# Patient Record
Sex: Male | Born: 1960 | Race: White | Hispanic: No | Smoking: Current every day smoker
Health system: Southern US, Community
[De-identification: ages and names within clinical notes are randomized; demographics above are authoritative.]

## PROBLEM LIST (undated history)

## (undated) ENCOUNTER — Emergency Department (HOSPITAL_COMMUNITY): Payer: Self-pay | Source: Home / Self Care

## (undated) ENCOUNTER — Emergency Department (HOSPITAL_COMMUNITY): Admission: EM | Payer: Medicare Other | Source: Home / Self Care

## (undated) DIAGNOSIS — M1990 Unspecified osteoarthritis, unspecified site: Secondary | ICD-10-CM

## (undated) DIAGNOSIS — J449 Chronic obstructive pulmonary disease, unspecified: Secondary | ICD-10-CM

## (undated) DIAGNOSIS — M549 Dorsalgia, unspecified: Secondary | ICD-10-CM

## (undated) DIAGNOSIS — J42 Unspecified chronic bronchitis: Secondary | ICD-10-CM

## (undated) DIAGNOSIS — T4145XA Adverse effect of unspecified anesthetic, initial encounter: Secondary | ICD-10-CM

## (undated) DIAGNOSIS — F329 Major depressive disorder, single episode, unspecified: Secondary | ICD-10-CM

## (undated) DIAGNOSIS — T8859XA Other complications of anesthesia, initial encounter: Secondary | ICD-10-CM

## (undated) DIAGNOSIS — F419 Anxiety disorder, unspecified: Secondary | ICD-10-CM

## (undated) DIAGNOSIS — I10 Essential (primary) hypertension: Secondary | ICD-10-CM

## (undated) DIAGNOSIS — G8929 Other chronic pain: Secondary | ICD-10-CM

## (undated) DIAGNOSIS — J45909 Unspecified asthma, uncomplicated: Secondary | ICD-10-CM

## (undated) DIAGNOSIS — K219 Gastro-esophageal reflux disease without esophagitis: Secondary | ICD-10-CM

## (undated) DIAGNOSIS — Z8719 Personal history of other diseases of the digestive system: Secondary | ICD-10-CM

## (undated) DIAGNOSIS — B20 Human immunodeficiency virus [HIV] disease: Secondary | ICD-10-CM

## (undated) DIAGNOSIS — I2699 Other pulmonary embolism without acute cor pulmonale: Secondary | ICD-10-CM

## (undated) DIAGNOSIS — IMO0002 Reserved for concepts with insufficient information to code with codable children: Secondary | ICD-10-CM

## (undated) DIAGNOSIS — F32A Depression, unspecified: Secondary | ICD-10-CM

## (undated) DIAGNOSIS — Z21 Asymptomatic human immunodeficiency virus [HIV] infection status: Secondary | ICD-10-CM

## (undated) DIAGNOSIS — J189 Pneumonia, unspecified organism: Secondary | ICD-10-CM

## (undated) DIAGNOSIS — M419 Scoliosis, unspecified: Secondary | ICD-10-CM

## (undated) DIAGNOSIS — Z8711 Personal history of peptic ulcer disease: Secondary | ICD-10-CM

## (undated) HISTORY — DX: Human immunodeficiency virus (HIV) disease: B20

## (undated) HISTORY — PX: KNEE ARTHROSCOPY: SHX127

## (undated) HISTORY — DX: Chronic obstructive pulmonary disease, unspecified: J44.9

## (undated) HISTORY — DX: Asymptomatic human immunodeficiency virus (hiv) infection status: Z21

---

## 1997-05-31 ENCOUNTER — Encounter (INDEPENDENT_AMBULATORY_CARE_PROVIDER_SITE_OTHER): Payer: Self-pay | Admitting: *Deleted

## 1997-05-31 LAB — CONVERTED CEMR LAB
CD4 Count: 308 microliters
CD4 T Cell Abs: 308

## 1997-10-28 DIAGNOSIS — M2351 Chronic instability of knee, right knee: Secondary | ICD-10-CM | POA: Insufficient documentation

## 1999-01-11 ENCOUNTER — Encounter: Admission: RE | Admit: 1999-01-11 | Discharge: 1999-01-11 | Payer: Self-pay | Admitting: Infectious Diseases

## 1999-01-11 ENCOUNTER — Ambulatory Visit (HOSPITAL_COMMUNITY): Admission: RE | Admit: 1999-01-11 | Discharge: 1999-01-11 | Payer: Self-pay | Admitting: Infectious Diseases

## 1999-03-07 ENCOUNTER — Encounter: Admission: RE | Admit: 1999-03-07 | Discharge: 1999-03-07 | Payer: Self-pay | Admitting: Internal Medicine

## 1999-05-04 ENCOUNTER — Encounter: Admission: RE | Admit: 1999-05-04 | Discharge: 1999-05-04 | Payer: Self-pay | Admitting: Internal Medicine

## 1999-05-04 ENCOUNTER — Ambulatory Visit (HOSPITAL_COMMUNITY): Admission: RE | Admit: 1999-05-04 | Discharge: 1999-05-04 | Payer: Self-pay | Admitting: Internal Medicine

## 1999-09-07 ENCOUNTER — Encounter: Admission: RE | Admit: 1999-09-07 | Discharge: 1999-09-07 | Payer: Self-pay | Admitting: Internal Medicine

## 1999-11-06 ENCOUNTER — Encounter: Payer: Self-pay | Admitting: Hematology and Oncology

## 1999-11-06 ENCOUNTER — Encounter: Admission: RE | Admit: 1999-11-06 | Discharge: 1999-11-06 | Payer: Self-pay | Admitting: Hematology and Oncology

## 1999-11-06 ENCOUNTER — Ambulatory Visit (HOSPITAL_COMMUNITY): Admission: RE | Admit: 1999-11-06 | Discharge: 1999-11-06 | Payer: Self-pay | Admitting: Hematology and Oncology

## 1999-12-27 ENCOUNTER — Encounter: Admission: RE | Admit: 1999-12-27 | Discharge: 1999-12-27 | Payer: Self-pay | Admitting: Hematology and Oncology

## 2000-08-26 ENCOUNTER — Ambulatory Visit (HOSPITAL_COMMUNITY): Admission: RE | Admit: 2000-08-26 | Discharge: 2000-08-26 | Payer: Self-pay | Admitting: Internal Medicine

## 2000-08-26 ENCOUNTER — Encounter: Admission: RE | Admit: 2000-08-26 | Discharge: 2000-08-26 | Payer: Self-pay | Admitting: Internal Medicine

## 2000-08-26 ENCOUNTER — Encounter: Payer: Self-pay | Admitting: Internal Medicine

## 2000-09-12 ENCOUNTER — Encounter: Admission: RE | Admit: 2000-09-12 | Discharge: 2000-09-12 | Payer: Self-pay | Admitting: Internal Medicine

## 2001-03-04 ENCOUNTER — Emergency Department (HOSPITAL_COMMUNITY): Admission: EM | Admit: 2001-03-04 | Discharge: 2001-03-04 | Payer: Self-pay | Admitting: *Deleted

## 2001-03-10 ENCOUNTER — Ambulatory Visit (HOSPITAL_COMMUNITY): Admission: RE | Admit: 2001-03-10 | Discharge: 2001-03-10 | Payer: Self-pay | Admitting: Internal Medicine

## 2001-03-10 ENCOUNTER — Encounter: Admission: RE | Admit: 2001-03-10 | Discharge: 2001-03-10 | Payer: Self-pay | Admitting: Internal Medicine

## 2001-05-06 ENCOUNTER — Encounter: Admission: RE | Admit: 2001-05-06 | Discharge: 2001-05-06 | Payer: Self-pay | Admitting: Internal Medicine

## 2001-12-22 ENCOUNTER — Ambulatory Visit (HOSPITAL_COMMUNITY): Admission: RE | Admit: 2001-12-22 | Discharge: 2001-12-22 | Payer: Self-pay | Admitting: Internal Medicine

## 2001-12-22 ENCOUNTER — Encounter: Admission: RE | Admit: 2001-12-22 | Discharge: 2001-12-22 | Payer: Self-pay | Admitting: Internal Medicine

## 2001-12-22 ENCOUNTER — Encounter: Payer: Self-pay | Admitting: Internal Medicine

## 2003-01-03 ENCOUNTER — Encounter: Admission: RE | Admit: 2003-01-03 | Discharge: 2003-01-03 | Payer: Self-pay | Admitting: Internal Medicine

## 2003-08-07 ENCOUNTER — Emergency Department (HOSPITAL_COMMUNITY): Admission: AD | Admit: 2003-08-07 | Discharge: 2003-08-08 | Payer: Self-pay | Admitting: Emergency Medicine

## 2003-08-08 ENCOUNTER — Encounter: Payer: Self-pay | Admitting: Emergency Medicine

## 2003-10-21 ENCOUNTER — Emergency Department (HOSPITAL_COMMUNITY): Admission: EM | Admit: 2003-10-21 | Discharge: 2003-10-22 | Payer: Self-pay | Admitting: Emergency Medicine

## 2003-10-25 ENCOUNTER — Ambulatory Visit (HOSPITAL_COMMUNITY): Admission: RE | Admit: 2003-10-25 | Discharge: 2003-10-25 | Payer: Self-pay | Admitting: Family Medicine

## 2003-10-31 ENCOUNTER — Ambulatory Visit (HOSPITAL_COMMUNITY): Admission: RE | Admit: 2003-10-31 | Discharge: 2003-10-31 | Payer: Self-pay | Admitting: Internal Medicine

## 2003-10-31 ENCOUNTER — Encounter: Admission: RE | Admit: 2003-10-31 | Discharge: 2003-10-31 | Payer: Self-pay | Admitting: Internal Medicine

## 2003-10-31 ENCOUNTER — Encounter: Payer: Self-pay | Admitting: Internal Medicine

## 2004-08-09 ENCOUNTER — Ambulatory Visit (HOSPITAL_COMMUNITY): Admission: RE | Admit: 2004-08-09 | Discharge: 2004-08-09 | Payer: Self-pay | Admitting: Internal Medicine

## 2004-08-09 ENCOUNTER — Ambulatory Visit: Payer: Self-pay | Admitting: Internal Medicine

## 2004-08-09 ENCOUNTER — Encounter (INDEPENDENT_AMBULATORY_CARE_PROVIDER_SITE_OTHER): Payer: Self-pay | Admitting: *Deleted

## 2004-08-09 LAB — CONVERTED CEMR LAB: CD4 Count: 400 microliters

## 2004-11-29 ENCOUNTER — Ambulatory Visit (HOSPITAL_COMMUNITY): Admission: RE | Admit: 2004-11-29 | Discharge: 2004-11-29 | Payer: Self-pay | Admitting: Family Medicine

## 2005-01-04 ENCOUNTER — Emergency Department (HOSPITAL_COMMUNITY): Admission: EM | Admit: 2005-01-04 | Discharge: 2005-01-04 | Payer: Self-pay | Admitting: Emergency Medicine

## 2005-06-26 ENCOUNTER — Ambulatory Visit (HOSPITAL_COMMUNITY): Admission: RE | Admit: 2005-06-26 | Discharge: 2005-06-26 | Payer: Self-pay | Admitting: Internal Medicine

## 2005-06-26 ENCOUNTER — Ambulatory Visit: Payer: Self-pay | Admitting: Internal Medicine

## 2005-06-26 ENCOUNTER — Encounter (INDEPENDENT_AMBULATORY_CARE_PROVIDER_SITE_OTHER): Payer: Self-pay | Admitting: *Deleted

## 2005-06-26 LAB — CONVERTED CEMR LAB: HIV 1 RNA Quant: 399 copies/mL

## 2005-08-08 ENCOUNTER — Ambulatory Visit (HOSPITAL_COMMUNITY): Admission: RE | Admit: 2005-08-08 | Discharge: 2005-08-08 | Payer: Self-pay | Admitting: Family Medicine

## 2006-01-02 ENCOUNTER — Ambulatory Visit (HOSPITAL_COMMUNITY): Admission: RE | Admit: 2006-01-02 | Discharge: 2006-01-02 | Payer: Self-pay | Admitting: Neurological Surgery

## 2006-02-22 ENCOUNTER — Emergency Department (HOSPITAL_COMMUNITY): Admission: EM | Admit: 2006-02-22 | Discharge: 2006-02-22 | Payer: Self-pay | Admitting: Emergency Medicine

## 2006-03-26 ENCOUNTER — Encounter: Admission: RE | Admit: 2006-03-26 | Discharge: 2006-03-26 | Payer: Self-pay | Admitting: Internal Medicine

## 2006-03-26 ENCOUNTER — Encounter (INDEPENDENT_AMBULATORY_CARE_PROVIDER_SITE_OTHER): Payer: Self-pay | Admitting: *Deleted

## 2006-03-26 ENCOUNTER — Ambulatory Visit (HOSPITAL_COMMUNITY): Admission: RE | Admit: 2006-03-26 | Discharge: 2006-03-26 | Payer: Self-pay | Admitting: Internal Medicine

## 2006-03-26 ENCOUNTER — Ambulatory Visit: Payer: Self-pay | Admitting: Internal Medicine

## 2006-03-26 LAB — CONVERTED CEMR LAB
CD4 Count: 480 microliters
HIV 1 RNA Quant: 399 copies/mL

## 2006-09-12 ENCOUNTER — Ambulatory Visit: Payer: Self-pay | Admitting: Internal Medicine

## 2006-09-12 ENCOUNTER — Encounter (INDEPENDENT_AMBULATORY_CARE_PROVIDER_SITE_OTHER): Payer: Self-pay | Admitting: *Deleted

## 2006-09-12 ENCOUNTER — Encounter: Admission: RE | Admit: 2006-09-12 | Discharge: 2006-09-12 | Payer: Self-pay | Admitting: Internal Medicine

## 2006-09-12 LAB — CONVERTED CEMR LAB
CD4 Count: 360 microliters
HIV 1 RNA Quant: 118 copies/mL

## 2006-09-30 ENCOUNTER — Ambulatory Visit: Payer: Self-pay | Admitting: Internal Medicine

## 2006-11-13 DIAGNOSIS — J449 Chronic obstructive pulmonary disease, unspecified: Secondary | ICD-10-CM | POA: Insufficient documentation

## 2006-11-13 DIAGNOSIS — B20 Human immunodeficiency virus [HIV] disease: Secondary | ICD-10-CM | POA: Insufficient documentation

## 2006-11-13 DIAGNOSIS — K219 Gastro-esophageal reflux disease without esophagitis: Secondary | ICD-10-CM | POA: Insufficient documentation

## 2006-11-13 DIAGNOSIS — F1910 Other psychoactive substance abuse, uncomplicated: Secondary | ICD-10-CM | POA: Insufficient documentation

## 2006-11-28 ENCOUNTER — Telehealth: Payer: Self-pay | Admitting: *Deleted

## 2006-12-03 ENCOUNTER — Encounter: Payer: Self-pay | Admitting: Internal Medicine

## 2006-12-03 DIAGNOSIS — M545 Low back pain, unspecified: Secondary | ICD-10-CM | POA: Insufficient documentation

## 2006-12-03 DIAGNOSIS — F329 Major depressive disorder, single episode, unspecified: Secondary | ICD-10-CM | POA: Insufficient documentation

## 2006-12-22 ENCOUNTER — Encounter (INDEPENDENT_AMBULATORY_CARE_PROVIDER_SITE_OTHER): Payer: Self-pay | Admitting: *Deleted

## 2006-12-22 LAB — CONVERTED CEMR LAB

## 2007-01-04 ENCOUNTER — Encounter (INDEPENDENT_AMBULATORY_CARE_PROVIDER_SITE_OTHER): Payer: Self-pay | Admitting: *Deleted

## 2007-02-05 ENCOUNTER — Emergency Department (HOSPITAL_COMMUNITY): Admission: EM | Admit: 2007-02-05 | Discharge: 2007-02-05 | Payer: Self-pay | Admitting: Emergency Medicine

## 2007-04-21 ENCOUNTER — Encounter: Admission: RE | Admit: 2007-04-21 | Discharge: 2007-04-21 | Payer: Self-pay | Admitting: Internal Medicine

## 2007-04-21 ENCOUNTER — Ambulatory Visit: Payer: Self-pay | Admitting: Internal Medicine

## 2007-04-21 DIAGNOSIS — R059 Cough, unspecified: Secondary | ICD-10-CM | POA: Insufficient documentation

## 2007-04-21 DIAGNOSIS — R05 Cough: Secondary | ICD-10-CM | POA: Insufficient documentation

## 2007-04-21 LAB — CONVERTED CEMR LAB
ALT: 15 units/L (ref 0–53)
AST: 15 units/L (ref 0–37)
Albumin: 4.4 g/dL (ref 3.5–5.2)
Alkaline Phosphatase: 105 units/L (ref 39–117)
BUN: 14 mg/dL (ref 6–23)
Basophils Absolute: 0.1 10*3/uL (ref 0.0–0.1)
Basophils Relative: 1 % (ref 0–1)
CD4 Count: 470 microliters
CO2: 26 meq/L (ref 19–32)
Calcium: 9.6 mg/dL (ref 8.4–10.5)
Chloride: 107 meq/L (ref 96–112)
Creatinine, Ser: 1.06 mg/dL (ref 0.40–1.50)
Eosinophils Absolute: 0.1 10*3/uL (ref 0.0–0.7)
Eosinophils Relative: 2 % (ref 0–5)
Glucose, Bld: 100 mg/dL — ABNORMAL HIGH (ref 70–99)
HCT: 48.3 % (ref 39.0–52.0)
HIV 1 RNA Quant: <50 copies/mL (ref <50)
HIV-1 RNA Quant, Log: <1.7 (ref <1.70)
Hemoglobin: 16.6 g/dL (ref 13.0–17.0)
Lymphocytes Relative: 26 % (ref 12–46)
Lymphs Abs: 1.6 10*3/uL (ref 0.7–3.3)
MCHC: 34.4 g/dL (ref 30.0–36.0)
MCV: 92.7 fL (ref 78.0–100.0)
Monocytes Absolute: 0.5 10*3/uL (ref 0.2–0.7)
Monocytes Relative: 8 % (ref 3–11)
Neutro Abs: 4 10*3/uL (ref 1.7–7.7)
Neutrophils Relative %: 64 % (ref 43–77)
Platelets: 212 10*3/uL (ref 150–400)
Potassium: 4 meq/L (ref 3.5–5.3)
RBC: 5.21 M/uL (ref 4.22–5.81)
RDW: 12.3 % (ref 11.5–14.0)
Sodium: 143 meq/L (ref 135–145)
Total Bilirubin: 0.2 mg/dL — ABNORMAL LOW (ref 0.3–1.2)
Total Protein: 6.9 g/dL (ref 6.0–8.3)
WBC: 6.2 10*3/uL (ref 4.0–10.5)

## 2007-04-24 ENCOUNTER — Ambulatory Visit (HOSPITAL_COMMUNITY): Admission: RE | Admit: 2007-04-24 | Discharge: 2007-04-24 | Payer: Self-pay | Admitting: Family Medicine

## 2007-05-13 ENCOUNTER — Ambulatory Visit: Payer: Self-pay | Admitting: Internal Medicine

## 2008-02-19 ENCOUNTER — Encounter: Admission: RE | Admit: 2008-02-19 | Discharge: 2008-02-19 | Payer: Self-pay | Admitting: Internal Medicine

## 2008-02-19 ENCOUNTER — Ambulatory Visit: Payer: Self-pay | Admitting: Internal Medicine

## 2008-02-19 LAB — CONVERTED CEMR LAB
ALT: 30 units/L (ref 0–53)
AST: 23 units/L (ref 0–37)
Albumin: 4.6 g/dL (ref 3.5–5.2)
Alkaline Phosphatase: 108 units/L (ref 39–117)
BUN: 13 mg/dL (ref 6–23)
Basophils Absolute: 0 10*3/uL (ref 0.0–0.1)
Basophils Relative: 0 % (ref 0–1)
Bilirubin Urine: NEGATIVE
CO2: 24 meq/L (ref 19–32)
Calcium: 9.1 mg/dL (ref 8.4–10.5)
Chlamydia, Swab/Urine, PCR: NEGATIVE
Chloride: 107 meq/L (ref 96–112)
Cholesterol: 165 mg/dL (ref 0–200)
Creatinine, Ser: 1.22 mg/dL (ref 0.40–1.50)
Eosinophils Absolute: 0.2 10*3/uL (ref 0.0–0.7)
Eosinophils Relative: 3 % (ref 0–5)
GC Probe Amp, Urine: NEGATIVE
Glucose, Bld: 118 mg/dL — ABNORMAL HIGH (ref 70–99)
HCT: 48.5 % (ref 39.0–52.0)
HDL: 34 mg/dL — ABNORMAL LOW (ref >39)
HIV 1 RNA Quant: <50 copies/mL (ref <50)
HIV-1 RNA Quant, Log: <1.7 (ref <1.70)
Hemoglobin, Urine: NEGATIVE
Hemoglobin: 16.7 g/dL (ref 13.0–17.0)
Ketones, ur: NEGATIVE mg/dL
LDL Cholesterol: 95 mg/dL (ref 0–99)
Leukocytes, UA: NEGATIVE
Lymphocytes Relative: 24 % (ref 12–46)
Lymphs Abs: 1.5 10*3/uL (ref 0.7–4.0)
MCHC: 34.4 g/dL (ref 30.0–36.0)
MCV: 94.9 fL (ref 78.0–100.0)
Monocytes Absolute: 0.4 10*3/uL (ref 0.1–1.0)
Monocytes Relative: 7 % (ref 3–12)
Neutro Abs: 4 10*3/uL (ref 1.7–7.7)
Neutrophils Relative %: 66 % (ref 43–77)
Nitrite: NEGATIVE
Platelets: 196 10*3/uL (ref 150–400)
Potassium: 3.9 meq/L (ref 3.5–5.3)
Protein, ur: NEGATIVE mg/dL
RBC: 5.11 M/uL (ref 4.22–5.81)
RDW: 12.4 % (ref 11.5–15.5)
Sodium: 141 meq/L (ref 135–145)
Specific Gravity, Urine: 1.028 (ref 1.005–1.03)
Total Bilirubin: 0.8 mg/dL (ref 0.3–1.2)
Total CHOL/HDL Ratio: 4.9
Total Protein: 7.1 g/dL (ref 6.0–8.3)
Triglycerides: 179 mg/dL — ABNORMAL HIGH (ref <150)
Urine Glucose: NEGATIVE mg/dL
Urobilinogen, UA: 0.2 (ref 0.0–1.0)
VLDL: 36 mg/dL (ref 0–40)
WBC: 6 10*3/uL (ref 4.0–10.5)
pH: 5.5 (ref 5.0–8.0)

## 2008-03-04 ENCOUNTER — Ambulatory Visit: Payer: Self-pay | Admitting: Internal Medicine

## 2008-03-04 DIAGNOSIS — J209 Acute bronchitis, unspecified: Secondary | ICD-10-CM | POA: Insufficient documentation

## 2008-08-08 ENCOUNTER — Telehealth: Payer: Self-pay

## 2008-08-17 ENCOUNTER — Ambulatory Visit: Payer: Self-pay | Admitting: Internal Medicine

## 2008-08-17 DIAGNOSIS — R61 Generalized hyperhidrosis: Secondary | ICD-10-CM | POA: Insufficient documentation

## 2008-08-17 LAB — CONVERTED CEMR LAB
ALT: 25 units/L (ref 0–53)
AST: 18 units/L (ref 0–37)
Albumin: 4.6 g/dL (ref 3.5–5.2)
Alkaline Phosphatase: 104 units/L (ref 39–117)
BUN: 16 mg/dL (ref 6–23)
Basophils Absolute: 0 10*3/uL (ref 0.0–0.1)
Basophils Relative: 0 % (ref 0–1)
CO2: 19 meq/L (ref 19–32)
Calcium: 9.4 mg/dL (ref 8.4–10.5)
Chloride: 107 meq/L (ref 96–112)
Creatinine, Ser: 1.15 mg/dL (ref 0.40–1.50)
Eosinophils Absolute: 0.2 10*3/uL (ref 0.0–0.7)
Eosinophils Relative: 3 % (ref 0–5)
Glucose, Bld: 110 mg/dL — ABNORMAL HIGH (ref 70–99)
HCT: 49.4 % (ref 39.0–52.0)
HIV 1 RNA Quant: 213 copies/mL — ABNORMAL HIGH (ref <50)
HIV-1 RNA Quant, Log: 2.33 — ABNORMAL HIGH (ref <1.70)
Hemoglobin: 17 g/dL (ref 13.0–17.0)
Lymphocytes Relative: 25 % (ref 12–46)
Lymphs Abs: 2.2 10*3/uL (ref 0.7–4.0)
MCHC: 34.4 g/dL (ref 30.0–36.0)
MCV: 93.4 fL (ref 78.0–100.0)
Monocytes Absolute: 0.5 10*3/uL (ref 0.1–1.0)
Monocytes Relative: 6 % (ref 3–12)
Neutro Abs: 6.1 10*3/uL (ref 1.7–7.7)
Neutrophils Relative %: 67 % (ref 43–77)
Platelets: 192 10*3/uL (ref 150–400)
Potassium: 3.8 meq/L (ref 3.5–5.3)
RBC: 5.29 M/uL (ref 4.22–5.81)
RDW: 11.9 % (ref 11.5–15.5)
Sodium: 138 meq/L (ref 135–145)
Total Bilirubin: 0.3 mg/dL (ref 0.3–1.2)
Total Protein: 7.2 g/dL (ref 6.0–8.3)
WBC: 9.1 10*3/uL (ref 4.0–10.5)

## 2008-11-15 ENCOUNTER — Ambulatory Visit: Payer: Self-pay | Admitting: Internal Medicine

## 2008-11-15 DIAGNOSIS — F17200 Nicotine dependence, unspecified, uncomplicated: Secondary | ICD-10-CM | POA: Insufficient documentation

## 2008-11-15 DIAGNOSIS — Z720 Tobacco use: Secondary | ICD-10-CM | POA: Insufficient documentation

## 2008-11-15 LAB — CONVERTED CEMR LAB
ALT: 27 units/L (ref 0–53)
AST: 20 units/L (ref 0–37)
Albumin: 4.6 g/dL (ref 3.5–5.2)
Alkaline Phosphatase: 93 units/L (ref 39–117)
BUN: 10 mg/dL (ref 6–23)
Basophils Absolute: 0.1 10*3/uL (ref 0.0–0.1)
Basophils Relative: 1 % (ref 0–1)
CO2: 22 meq/L (ref 19–32)
Calcium: 9.3 mg/dL (ref 8.4–10.5)
Chloride: 104 meq/L (ref 96–112)
Cholesterol: 160 mg/dL (ref 0–200)
Creatinine, Ser: 1.1 mg/dL (ref 0.40–1.50)
Eosinophils Absolute: 0.2 10*3/uL (ref 0.0–0.7)
Eosinophils Relative: 4 % (ref 0–5)
Glucose, Bld: 91 mg/dL (ref 70–99)
HCT: 45.3 % (ref 39.0–52.0)
HDL: 37 mg/dL — ABNORMAL LOW (ref >39)
HIV 1 RNA Quant: 165 copies/mL — ABNORMAL HIGH (ref <48)
HIV-1 RNA Quant, Log: 2.22 — ABNORMAL HIGH (ref <1.68)
Hemoglobin: 16 g/dL (ref 13.0–17.0)
LDL Cholesterol: 99 mg/dL (ref 0–99)
Lymphocytes Relative: 36 % (ref 12–46)
Lymphs Abs: 2.2 10*3/uL (ref 0.7–4.0)
MCHC: 35.3 g/dL (ref 30.0–36.0)
MCV: 89.9 fL (ref 78.0–100.0)
Monocytes Absolute: 0.5 10*3/uL (ref 0.1–1.0)
Monocytes Relative: 8 % (ref 3–12)
Neutro Abs: 3.3 10*3/uL (ref 1.7–7.7)
Neutrophils Relative %: 52 % (ref 43–77)
Platelets: 214 10*3/uL (ref 150–400)
Potassium: 4.3 meq/L (ref 3.5–5.3)
RBC: 5.04 M/uL (ref 4.22–5.81)
RDW: 12.4 % (ref 11.5–15.5)
Sodium: 140 meq/L (ref 135–145)
Total Bilirubin: 0.3 mg/dL (ref 0.3–1.2)
Total CHOL/HDL Ratio: 4.3
Total Protein: 7.1 g/dL (ref 6.0–8.3)
Triglycerides: 122 mg/dL (ref <150)
VLDL: 24 mg/dL (ref 0–40)
WBC: 6.3 10*3/uL (ref 4.0–10.5)

## 2008-11-16 ENCOUNTER — Encounter: Payer: Self-pay | Admitting: Licensed Clinical Social Worker

## 2009-02-14 ENCOUNTER — Ambulatory Visit: Payer: Self-pay | Admitting: Internal Medicine

## 2009-02-14 DIAGNOSIS — J309 Allergic rhinitis, unspecified: Secondary | ICD-10-CM | POA: Insufficient documentation

## 2009-02-14 LAB — CONVERTED CEMR LAB
ALT: 21 units/L (ref 0–53)
AST: 17 units/L (ref 0–37)
Albumin: 4.4 g/dL (ref 3.5–5.2)
Alkaline Phosphatase: 100 units/L (ref 39–117)
BUN: 9 mg/dL (ref 6–23)
Basophils Absolute: 0 10*3/uL (ref 0.0–0.1)
Basophils Relative: 0 % (ref 0–1)
CO2: 25 meq/L (ref 19–32)
Calcium: 9.6 mg/dL (ref 8.4–10.5)
Chloride: 105 meq/L (ref 96–112)
Cholesterol: 169 mg/dL (ref 0–200)
Creatinine, Ser: 1.1 mg/dL (ref 0.40–1.50)
Eosinophils Absolute: 0.2 10*3/uL (ref 0.0–0.7)
Eosinophils Relative: 4 % (ref 0–5)
GFR calc Af Amer: >60 mL/min (ref >60)
GFR calc non Af Amer: >60 mL/min (ref >60)
Glucose, Bld: 90 mg/dL (ref 70–99)
HCT: 46.2 % (ref 39.0–52.0)
HDL: 37 mg/dL — ABNORMAL LOW (ref >39)
HIV 1 RNA Quant: <48 copies/mL (ref <48)
HIV-1 RNA Quant, Log: <1.68 (ref <1.68)
Hemoglobin: 16.2 g/dL (ref 13.0–17.0)
LDL Cholesterol: 81 mg/dL (ref 0–99)
Lymphocytes Relative: 34 % (ref 12–46)
Lymphs Abs: 2 10*3/uL (ref 0.7–4.0)
MCHC: 35.1 g/dL (ref 30.0–36.0)
MCV: 93.1 fL (ref 78.0–100.0)
Monocytes Absolute: 0.4 10*3/uL (ref 0.1–1.0)
Monocytes Relative: 7 % (ref 3–12)
Neutro Abs: 3.2 10*3/uL (ref 1.7–7.7)
Neutrophils Relative %: 55 % (ref 43–77)
Platelets: 204 10*3/uL (ref 150–400)
Potassium: 4.2 meq/L (ref 3.5–5.3)
RBC: 4.96 M/uL (ref 4.22–5.81)
RDW: 12.1 % (ref 11.5–15.5)
Sodium: 142 meq/L (ref 135–145)
Total Bilirubin: 0.4 mg/dL (ref 0.3–1.2)
Total CHOL/HDL Ratio: 4.6
Total Protein: 7.3 g/dL (ref 6.0–8.3)
Triglycerides: 256 mg/dL — ABNORMAL HIGH (ref <150)
VLDL: 51 mg/dL — ABNORMAL HIGH (ref 0–40)
WBC: 5.8 10*3/uL (ref 4.0–10.5)

## 2009-02-28 ENCOUNTER — Encounter (INDEPENDENT_AMBULATORY_CARE_PROVIDER_SITE_OTHER): Payer: Self-pay | Admitting: *Deleted

## 2009-03-13 ENCOUNTER — Encounter: Payer: Self-pay | Admitting: Internal Medicine

## 2009-05-26 ENCOUNTER — Ambulatory Visit: Payer: Self-pay | Admitting: Internal Medicine

## 2009-05-26 LAB — CONVERTED CEMR LAB
ALT: 31 units/L (ref 0–53)
AST: 27 units/L (ref 0–37)
Albumin: 4 g/dL (ref 3.5–5.2)
Alkaline Phosphatase: 90 units/L (ref 39–117)
BUN: 9 mg/dL (ref 6–23)
Basophils Absolute: 0 10*3/uL (ref 0.0–0.1)
Basophils Relative: 1 % (ref 0–1)
CO2: 24 meq/L (ref 19–32)
Calcium: 8.7 mg/dL (ref 8.4–10.5)
Chloride: 109 meq/L (ref 96–112)
Creatinine, Ser: 1.12 mg/dL (ref 0.40–1.50)
Eosinophils Absolute: 0.3 10*3/uL (ref 0.0–0.7)
Eosinophils Relative: 6 % — ABNORMAL HIGH (ref 0–5)
Glucose, Bld: 102 mg/dL — ABNORMAL HIGH (ref 70–99)
HCT: 43.9 % (ref 39.0–52.0)
HIV 1 RNA Quant: <48 copies/mL (ref <48)
HIV-1 RNA Quant, Log: <1.68 (ref <1.68)
Hemoglobin: 14.9 g/dL (ref 13.0–17.0)
Lymphocytes Relative: 28 % (ref 12–46)
Lymphs Abs: 1.4 10*3/uL (ref 0.7–4.0)
MCHC: 33.9 g/dL (ref 30.0–36.0)
MCV: 94.6 fL (ref >=78.0)
Monocytes Absolute: 0.4 10*3/uL (ref 0.1–1.0)
Monocytes Relative: 8 % (ref 3–12)
Neutro Abs: 2.9 10*3/uL (ref 1.7–7.7)
Neutrophils Relative %: 58 % (ref 43–77)
Platelets: 195 10*3/uL (ref 150–400)
Potassium: 3.8 meq/L (ref 3.5–5.3)
RBC: 4.64 M/uL (ref 4.22–5.81)
RDW: 12.3 % (ref 11.5–15.5)
Sodium: 144 meq/L (ref 135–145)
Total Bilirubin: 0.2 mg/dL — ABNORMAL LOW (ref 0.3–1.2)
Total Protein: 6.3 g/dL (ref 6.0–8.3)
WBC: 5 10*3/uL (ref 4.0–10.5)

## 2009-08-25 ENCOUNTER — Ambulatory Visit: Payer: Self-pay | Admitting: Internal Medicine

## 2009-08-25 LAB — CONVERTED CEMR LAB
ALT: 17 units/L (ref 0–53)
AST: 14 units/L (ref 0–37)
Albumin: 4.6 g/dL (ref 3.5–5.2)
Alkaline Phosphatase: 89 units/L (ref 39–117)
BUN: 11 mg/dL (ref 6–23)
Basophils Absolute: 0 10*3/uL (ref 0.0–0.1)
Basophils Relative: 1 % (ref 0–1)
CO2: 21 meq/L (ref 19–32)
Calcium: 9.6 mg/dL (ref 8.4–10.5)
Chloride: 108 meq/L (ref 96–112)
Creatinine, Ser: 1.11 mg/dL (ref 0.40–1.50)
Eosinophils Absolute: 0.2 10*3/uL (ref 0.0–0.7)
Eosinophils Relative: 3 % (ref 0–5)
Glucose, Bld: 105 mg/dL — ABNORMAL HIGH (ref 70–99)
HCT: 49.4 % (ref 39.0–52.0)
HIV 1 RNA Quant: <48 copies/mL (ref <48)
HIV-1 RNA Quant, Log: <1.68 (ref <1.68)
Hemoglobin: 16.8 g/dL (ref 13.0–17.0)
Hep B S Ab: NEGATIVE
Lymphocytes Relative: 29 % (ref 12–46)
Lymphs Abs: 1.7 10*3/uL (ref 0.7–4.0)
MCHC: 34 g/dL (ref 30.0–36.0)
MCV: 93.2 fL (ref >=78.0)
Monocytes Absolute: 0.5 10*3/uL (ref 0.1–1.0)
Monocytes Relative: 8 % (ref 3–12)
Neutro Abs: 3.5 10*3/uL (ref 1.7–7.7)
Neutrophils Relative %: 60 % (ref 43–77)
Platelets: 201 10*3/uL (ref 150–400)
Potassium: 4.1 meq/L (ref 3.5–5.3)
RBC: 5.3 M/uL (ref 4.22–5.81)
RDW: 12.2 % (ref 11.5–15.5)
Sodium: 143 meq/L (ref 135–145)
Total Bilirubin: 0.3 mg/dL (ref 0.3–1.2)
Total Protein: 7 g/dL (ref 6.0–8.3)
WBC: 5.8 10*3/uL (ref 4.0–10.5)

## 2009-11-24 ENCOUNTER — Ambulatory Visit: Payer: Self-pay | Admitting: Internal Medicine

## 2009-11-24 DIAGNOSIS — J019 Acute sinusitis, unspecified: Secondary | ICD-10-CM | POA: Insufficient documentation

## 2009-11-24 LAB — CONVERTED CEMR LAB
ALT: 14 units/L (ref 0–53)
AST: 14 units/L (ref 0–37)
Albumin: 4.1 g/dL (ref 3.5–5.2)
Alkaline Phosphatase: 88 units/L (ref 39–117)
BUN: 10 mg/dL (ref 6–23)
Basophils Absolute: 0 10*3/uL (ref 0.0–0.1)
Basophils Relative: 1 % (ref 0–1)
CO2: 26 meq/L (ref 19–32)
Calcium: 8.7 mg/dL (ref 8.4–10.5)
Chloride: 108 meq/L (ref 96–112)
Creatinine, Ser: 0.96 mg/dL (ref 0.40–1.50)
Eosinophils Absolute: 0.2 10*3/uL (ref 0.0–0.7)
Eosinophils Relative: 4 % (ref 0–5)
Glucose, Bld: 89 mg/dL (ref 70–99)
HCT: 46.7 % (ref 39.0–52.0)
Hemoglobin: 15.7 g/dL (ref 13.0–17.0)
Lymphocytes Relative: 31 % (ref 12–46)
Lymphs Abs: 1.5 10*3/uL (ref 0.7–4.0)
MCHC: 33.6 g/dL (ref 30.0–36.0)
MCV: 96.1 fL (ref >=78.0)
Monocytes Absolute: 0.4 10*3/uL (ref 0.1–1.0)
Monocytes Relative: 7 % (ref 3–12)
Neutro Abs: 2.8 10*3/uL (ref 1.7–7.7)
Neutrophils Relative %: 57 % (ref 43–77)
Platelets: 207 10*3/uL (ref 150–400)
Potassium: 3.8 meq/L (ref 3.5–5.3)
RBC: 4.86 M/uL (ref 4.22–5.81)
RDW: 12.8 % (ref 11.5–15.5)
Sodium: 143 meq/L (ref 135–145)
Total Bilirubin: 0.2 mg/dL — ABNORMAL LOW (ref 0.3–1.2)
Total Protein: 6.4 g/dL (ref 6.0–8.3)
WBC: 4.9 10*3/uL (ref 4.0–10.5)

## 2010-02-23 ENCOUNTER — Ambulatory Visit: Payer: Self-pay | Admitting: Internal Medicine

## 2010-02-23 LAB — CONVERTED CEMR LAB
ALT: 18 units/L (ref 0–53)
Albumin: 4.3 g/dL (ref 3.5–5.2)
CO2: 24 meq/L (ref 19–32)
Calcium: 8.8 mg/dL (ref 8.4–10.5)
Chloride: 108 meq/L (ref 96–112)
Creatinine, Ser: 1.16 mg/dL (ref 0.40–1.50)
Eosinophils Absolute: 0.2 10*3/uL (ref 0.0–0.7)
Eosinophils Relative: 3 % (ref 0–5)
HCT: 45.3 % (ref 39.0–52.0)
HIV 1 RNA Quant: <48 copies/mL (ref <48)
Lymphocytes Relative: 31 % (ref 12–46)
Lymphs Abs: 1.9 10*3/uL (ref 0.7–4.0)
MCV: 94.8 fL (ref 78.0–100.0)
Monocytes Relative: 7 % (ref 3–12)
Neutrophils Relative %: 59 % (ref 43–77)
Potassium: 3.8 meq/L (ref 3.5–5.3)
RBC: 4.78 M/uL (ref 4.22–5.81)
WBC: 6 10*3/uL (ref 4.0–10.5)

## 2010-03-14 ENCOUNTER — Telehealth: Payer: Self-pay | Admitting: Internal Medicine

## 2010-05-24 ENCOUNTER — Ambulatory Visit: Payer: Self-pay | Admitting: Internal Medicine

## 2010-05-24 LAB — CONVERTED CEMR LAB
ALT: 15 units/L (ref 0–53)
AST: 18 units/L (ref 0–37)
Albumin: 4.1 g/dL (ref 3.5–5.2)
Alkaline Phosphatase: 93 units/L (ref 39–117)
Basophils Absolute: 0 10*3/uL (ref 0.0–0.1)
Basophils Relative: 1 % (ref 0–1)
Calcium: 8.9 mg/dL (ref 8.4–10.5)
Chloride: 108 meq/L (ref 96–112)
Eosinophils Absolute: 0.2 10*3/uL (ref 0.0–0.7)
MCHC: 33.8 g/dL (ref 30.0–36.0)
MCV: 95.3 fL (ref 78.0–100.0)
Monocytes Relative: 8 % (ref 3–12)
Neutro Abs: 3.4 10*3/uL (ref 1.7–7.7)
Neutrophils Relative %: 61 % (ref 43–77)
Platelets: 192 10*3/uL (ref 150–400)
Potassium: 3.7 meq/L (ref 3.5–5.3)
RBC: 4.87 M/uL (ref 4.22–5.81)
RDW: 12.2 % (ref 11.5–15.5)
Sodium: 142 meq/L (ref 135–145)

## 2010-06-12 ENCOUNTER — Telehealth (INDEPENDENT_AMBULATORY_CARE_PROVIDER_SITE_OTHER): Payer: Self-pay | Admitting: *Deleted

## 2010-06-12 ENCOUNTER — Ambulatory Visit: Payer: Self-pay | Admitting: Internal Medicine

## 2010-06-12 ENCOUNTER — Observation Stay (HOSPITAL_COMMUNITY): Admission: EM | Admit: 2010-06-12 | Discharge: 2010-06-13 | Payer: Self-pay | Admitting: Internal Medicine

## 2010-06-12 ENCOUNTER — Ambulatory Visit: Payer: Self-pay | Admitting: Infectious Disease

## 2010-06-12 ENCOUNTER — Encounter: Payer: Self-pay | Admitting: Internal Medicine

## 2010-06-12 DIAGNOSIS — R109 Unspecified abdominal pain: Secondary | ICD-10-CM | POA: Insufficient documentation

## 2010-06-12 DIAGNOSIS — R079 Chest pain, unspecified: Secondary | ICD-10-CM | POA: Insufficient documentation

## 2010-06-13 ENCOUNTER — Encounter: Payer: Self-pay | Admitting: Internal Medicine

## 2010-06-18 ENCOUNTER — Encounter: Payer: Self-pay | Admitting: Infectious Disease

## 2010-07-05 ENCOUNTER — Encounter (INDEPENDENT_AMBULATORY_CARE_PROVIDER_SITE_OTHER): Payer: Self-pay | Admitting: *Deleted

## 2010-07-05 ENCOUNTER — Ambulatory Visit: Payer: Self-pay | Admitting: Cardiovascular Disease

## 2010-07-05 DIAGNOSIS — R0602 Shortness of breath: Secondary | ICD-10-CM | POA: Insufficient documentation

## 2010-07-06 ENCOUNTER — Encounter: Payer: Self-pay | Admitting: Cardiovascular Disease

## 2010-07-18 ENCOUNTER — Ambulatory Visit: Payer: Self-pay | Admitting: Internal Medicine

## 2010-08-09 ENCOUNTER — Ambulatory Visit (HOSPITAL_COMMUNITY): Admission: RE | Admit: 2010-08-09 | Payer: Self-pay | Admitting: Cardiovascular Disease

## 2010-08-14 ENCOUNTER — Telehealth (INDEPENDENT_AMBULATORY_CARE_PROVIDER_SITE_OTHER): Payer: Self-pay | Admitting: *Deleted

## 2010-11-21 ENCOUNTER — Encounter (INDEPENDENT_AMBULATORY_CARE_PROVIDER_SITE_OTHER): Payer: Self-pay | Admitting: *Deleted

## 2010-11-27 NOTE — Progress Notes (Signed)
Summary: c/o congestion  Phone Note Call from Patient   Caller: Patient Summary of Call: Pt. called c/o chest congestion and cough x 2 weeks.  The cough is nonproductive, unless he uses his inhaler.  An appt. was scheduled with Dr. Algis Liming for this afternoon. Initial call taken by: Wendall Mola CMA Duncan Dull),  June 12, 2010 2:29 PM

## 2010-11-27 NOTE — Initial Assessments (Signed)
INTERNAL MEDICINE ADMISSION HISTORY AND PHYSICAL  Attending: Dr. Aundria Rud  First contact: Dr. Loistine Chance   [phone] Second contact: Dr. Aldine Contes  [phone] [facility], after-hours: [phone], [phone])  PCP: Dr. Philipp Deputy  CC: epigastric abdominal and chest pain  HPI: 50 y/o man who presented today for epigastric abdominal pain for 10 days followed by some chest pain for a week.  The patient was in his usual stated of health until a week and a half ago when he started having the epigastric discomfort followed by a binge drinking. This has been persistent since then and has got worse, this friday when he again drank 18 beers. The patient describes it as 6/10 dull achy pain. This was followed by chest pain that is diffuse all over his chest, worsens with cough, straining and sitting up. He also reports some cough that was productive to begin with but is now mostly dry. He was bringing up greenish phlegm initially and took some antibiotics that was prescribed by Dr. Drue Second  him about five months ago. He also took some Combivent and says "Combivent is helping him to break up his congestion".He denies SOB/palpitations/fever/chills/nausea/vomiting diarrhea.  He endorses some gerneralised malaise, flu like symptoms and fatigue.  He reports weight loss of 5lbs in 2 weeks; that was unintentional.  He was very depressed and tearful as he broke up with his long term girlfriend  He admits to a suicide attempt in the past.  Denies suicidal intent at this.   Reports taking Atripla regularly; may have missed a dose 2/2 getting drunk 7-10 days ago.   ALLERGIES: NKDA   PAST MEDICAL HISTORY: COPD HIV disease: CD4: 550, VL:<48 (04/2010) GERD Depression Low back pain   MEDICATIONS: DIAZEPAM 10 MG TABS (DIAZEPAM) Take 1 tablet by mouth three times a day PROTONIX 40 MG TBEC (PANTOPRAZOLE SODIUM) Take 1 tablet by mouth two times a day AMITRIPTYLINE HCL 50 MG TABS (AMITRIPTYLINE HCL) Take 1 tablet by mouth at  bedtime COMBIVENT 103-18 MCG/ACT AERO (ALBUTEROL-IPRATROPIUM) Dose/frequency not in chart ATRIPLA 600-200-300 MG TABS (EFAVIRENZ-EMTRICITAB-TENOFOVIR) Take 1 tablet by mouth at bedtime CLARITIN 10 MG  TABS (LORATADINE) Take 1 tablet by mouth once a day SINGULAIR 10 MG TABS (MONTELUKAST SODIUM) Take 1 tablet by mouth once a day FLONASE 50 MCG/ACT SUSP (FLUTICASONE PROPIONATE) one spray two times a day NICODERM CQ 21 MG/24HR PT24 (NICOTINE) apply one patch once daily HYDROCODONE-ACETAMINOPHEN 10-325 MG TABS (HYDROCODONE-ACETAMINOPHEN) one tablet three times a day FLEXERIL 10 MG TABS (CYCLOBENZAPRINE HCL) as directed     FAMILY HISTORY No family hx of CAD.   Social History    He is divorced. Had a break up withhis ex girlfriend recently. HAs two children,son from wife and daughter from his ex- girlfriend. Alcohol drinks/day: occassionally but lately has been drinking a lot.     Alcohol type: beer. Last drink- 4 days ago.     Smoking Status: current     Smoking Cessation Counseling: yes     Packs/Day: 1.0  Drugs: He uses marijuana ocassionally. He had one yesterday. He admits using cocaine. the last time when he used cocaine was in June.   ROS:See HPI . he reports some brown stools.  VITALS:   T: 97.4 P: 94 BP:  139/92 R: 16 O2SAT:94% ON:RA  PHYSICAL EXAM:  GEN: NAD HEAD: NCAT,  EYES: PERRL, EOMI, no icterus, no pallor NECK: supple, no JVD, no cervical LN LUNGS:decreased breath sounds bilaterally, occasional wheezes were present.   CVS: RRR, distant heart sounds,  no murmur/rubs/gallops ABD: Soft, epigastric tenderness, , no rebound tenderness, normal bowel sounds. EXTREMITIES: No edema or cyanosis NEURO: Alert &Oriented x3, no focal motor or sensory deficits.     LABS:pending   ASSESSMENT AND PLAN: 49y/o man who presnted with some epigastrics pain and chest pain.  (1) Epigastric pain: Differentials include gastritis verus pancreatitis versus PUD.       Check CBC, BMET,   Lipase, FOBTx3, stool H.pylori antigen.          Protonix 40mg  by mouth two times a day.        Risk statification- HbA1C, FLP.  (2)Chest pain: most likely muskuloskeletal  from COPD and cough.       Also check cardiac enzymesx1     12 lead EKG now and in the Am to rule out any cardiac etiology.     Not ordering 2D echo at this point as most likely this is noncardiac in origin.   (3) COPD- mild wheezing,02 sats good on room air.        CXR PA/lateral.        Continue Singulair and Combivent.        Started him on Advair.        (3) Substance Abuse      Alcohol and Smoking cessation councelling.      Check UDS, and UA.       Nicotine patch 21mg .      Started him on thiamine and folate.  (4)HIV    Continue him on Atripla.     (5)DEPRESSION/ANXIETY:    Denied SI/HI, will not put  sitters. Continue home meds- one dose of amitryptiline now.    Psych consult tomorrow.  (6)VTE PROPH: lovenox

## 2010-11-27 NOTE — Letter (Signed)
Summary: DR Sudie Bailey PROGRESS NOTE 03/21/10  DR Sudie Bailey PROGRESS NOTE 03/21/10   Imported By: Faythe Ghee 07/06/2010 12:04:08  _____________________________________________________________________  External Attachment:    Type:   Image     Comment:   External Document

## 2010-11-27 NOTE — Letter (Signed)
Summary: Dobutamine Echocardiogram  [facility]  [address], [place] [address]   Phone: [phone]  Fax: [phone]     [facility] Dobutamine Echocardiogram    Devin [name]  Appointment Date:_  Appointment Time:_   Your doctor has ordered a Dobutamine echo to help determine the condition of our heart. If you take blood pressure medication, ask your doctor if you should take your medications the day of the procedure. Do not eat and drink 4 hours before the test is scheduled.  You will be asked to undress from the waist up and given a hospital gown to wear, so dress comfortaly from the waist down.  You will need to register at the admission department located on the Outpatient Surgery side of the hospital. Make sure to bring your insurance information and a form of ID.  Your appointment will last approximately one and one-half hours

## 2010-11-27 NOTE — Progress Notes (Signed)
Summary: requesting refill Avelox  Phone Note Call from Patient   Caller: Patient [phone] Call For: Yisroel Ramming MD Reason for Call: Acute Illness Details for Reason: requesting RX antibiotic Summary of Call: Pt. called requesting another RX for Avelox, c/o of "lungs still hurting" Initial call taken by: Wendall Mola CMA Duncan Dull),  Mar 14, 2010 4:35 PM  Follow-up for Phone Call        ok x 1 Follow-up by: Yisroel Ramming MD,  Mar 15, 2010 9:59 AM    Prescriptions: AVELOX 400 MG TABS (MOXIFLOXACIN HCL) Take 1 tablet by mouth once a day  #10 x 0   Entered by:   Wendall Mola CMA ( AAMA)   Authorized by:   Yisroel Ramming MD   Signed by:   Wendall Mola CMA ( AAMA) on 03/15/2010   Method used:   Electronically to        [facility]  [address] (retail)       [address]       [place], [place]  [address]       Ph: [phone]       Fax: [phone]   RxID:   [id number]

## 2010-11-27 NOTE — Assessment & Plan Note (Signed)
Summary: c/o chest congestion and cough/jc   CC:  c/o chest congestion and cough.  History of Present Illness: Pt is a 50 y/o M with PMH outlined in the EMR.  He presents today for an acute visit with c/o cough, sinus pressure and congestion and general malaise "like he has the flu or PNA."  These symptoms have been present for 7-10days.  Reports productive cough of green sputum one week ago; but states this has resolved although he feels as though he needs to cough up phlem.  Feels like Combivent is helping to break up congestion a little.  Reports feeling generally weak and tired.  Denies sore throat, fevers, chills, nausea, vomiting, diarrhea, and chest pain/tightness that worsens with inspiration. States this pain/pressure is constant that is also present in his arm/jaw.  Pt cannot recall when/how this pain started.  Describes pain is present throughout sternum and entire chest wall/ribs.  Reports left ear is also "popping" no pain or drainage.  Took some abx at home for the past 2 days (cannot recall name); donot seem to be helping.  Also reports intermittent dizziness and episode of syncope 3-4 weeks ago when he was outside in the heat; did not go to the ER for evaluation.  Denies any podrome prior to this syncopal event, "just felt hot."  He reports weight loss of 5lbs in 2 weeks; he states this was unintentional.   Has not been taking singulair or claritin.  Reports taking Atripla regularly; may have missed a dose 2/2 getting drunk 7-10 days ago.  Reports inabilty to recall events of that night and blacking out.  Last drink was 4-5 days ago.  He reports epigastric burning and reflux that began when he was drinking and has not improved; has not been taking protonix as directed.  Upon returning with Dr. Algis Liming to examine pt; pt states he is very depressed and that he "can't keep going."  He admits to a suicide attempt by OD in the past.  Denies suicidal intent but admits to thoughts of something  bad happening to him.   Preventive Screening-Counseling & Management  Alcohol-Tobacco     Alcohol drinks/day: occassionally     Alcohol type: beer     Smoking Status: current     Smoking Cessation Counseling: yes     Packs/Day: 1.0  Caffeine-Diet-Exercise     Caffeine use/day: tea     Does Patient Exercise: no  Comments: "Pulled a drunk" about 1 1/2 weeks ago and felt like kidneys were not working. Sternum area started hurting after first couple of drinks and patient has not felt good since. Hurts around eyes and feels like brain has not had any oxygen seeing "bubbles or specks" in the air.  Safety-Violence-Falls     Seat Belt Use: yes   Current Allergies (reviewed today): No known allergies  Past History:  Past medical, surgical, family and social histories (including risk factors) reviewed for relevance to current acute and chronic problems.  Past Medical History: Reviewed history from 11/13/2006 and no changes required. COPD HIV disease GERD Depression Low back pain  Family History: Reviewed history and no changes required. No family hx of CAD.  Social History: Reviewed history and no changes required. Pt reports hx of suicide attempt in the past via O/D.  Vital Signs:  Patient profile:   50 year old male Height:      76 inches (193.04 cm) Weight:      181.8 pounds (82.64 kg) BMI:  22.21 Temp:     98.1 degrees F (36.72 degrees C) oral Pulse rate:   68 / minute BP sitting:   154 / 92  (left arm)  Vitals Entered By: Kathi Simpers [facility]) (June 12, 2010 4:04 PM) CC: c/o chest congestion and cough Pain Assessment Patient in pain? yes     Location: chest Intensity: 3 Type: pressure Onset of pain  constant for the past week Nutritional Status BMI of 19 -24 = normal Nutritional Status Detail appetite is not good per patient  Have you ever been in a relationship where you felt threatened, hurt or afraid?No   Does patient need assistance? Functional  Status Self care Ambulation Normal   Physical Exam  General:  alert, well-developed, well-nourished, and well-hydrated.   Head:  normocephalic and atraumatic.   Eyes:  vision grossly intact.  PERRL. EOMI Mouth:  pharynx pink and moist, no erythema, and no exudates.   Lungs:  few scattered expiratory wheezes.  Good air movement bilaterally.  Normal effort, no crackles or ronchi. Heart:  normal rate, regular rhythm, and no murmur.  no gallop and no rub.   Abdomen:  soft, non-distended.  no masses.  Diffuse TTP with voluntary guarding; pain is greatest in the epigastric area.  BS present and normal in all quads.   Msk:  normal ROM.   Extremities:  No clubbing, cyanosis, or edema. Neurologic:  alert & oriented X3, cranial nerves II-XII intact, and strength normal in all extremities.  Pt has difficulty walking 2/2 abdominal pain. Psych:  Oriented X3, memory intact for recent and remote, depressed affect, poor eye contact, tearful, and judgment poor.     Impression & Recommendations:  Problem # 1:  CHEST PAIN (ICD-786.50)  Pts chest pain and tightness are concerning, particularly in light of is recent non-podromal syncopal epsiode, smoking hx, and recent EtOH induced black out.  Differential includes ACS, PE, aspiration PNA, COPD exac, etc.  Pt was strongly encourged by all staff present to be directly admitted to [facility] for continued evaluation and work up of his chest pain to include all of the aforementioned etiologies.  EKG was obtained and revealed: Sinus brady, possible Q present in lead 3 but no adjacent leads, no ST or T wave abnormalities noted.  The benefits of hospitalization and the risks of remaining at home without further evaluation (including risk of death) were discussed at length with the patient.  He agreed to hospital admission but only after he was able to drive home to [place] to make arrangements for someone to care for his home.  He did not his cell phone available and could  not recall any numbers to settle this in clinic for direct admissioln.  He is concerned his house may be broken into and was very anxious that he make arrangements to give his keys to a family member/friend before admission.  He was advised against this, as it would take a significant amount of time to drive home to [place] and back to [place].  Pt aknowledged risks of this and promised to return within 2 hours for hospital admission.  IM teaching service accepted admission; bed is available and ready for pt.  Pt made aware and instructed to return to [facility] to admitting dept for admission; pt provided with directions.  Orders: Est. Patient Level V ([address]109)  Problem # 2:  ABDOMINAL PAIN (ICD-789.00)  Pts epigastric pain is concerning for multiple possible etiologies including: PUD, pancreatitis, severe GERD, EtOH related gastritis, etc.  His  symptoms were likely exacerbated by, and possibly the result of, his EtOH abuse and inconsistent use of his protonix.  The possible etiologies were discussed with pt.  He was informed that he would benefit from inpatient evaluation and pain control.  He agreed to this;please refer to above discussion re: plan to admit pt.  Orders: Est. Patient Level V (91478)  Problem # 3:  DEPRESSION (ICD-311)  Pt very tearful and severly depressed.  He denies any specific thoughts of self harm, intent, or plan to commit suicide but admits to passive (thoughts of harm/bad things happening to him).  He needs evaluation by psychiatry for further management of his severe depression.  He agreed to hospital admission and inpatient evaluation by psych; please refer to discussion in problem #1 re: hospital admission.  I am very concerned about pts symptoms and hx of suicide attempt in the past.  He will very likely need continued psych management on an outpt basis; would refer him to mental health if outpt management is not established during his hospital admission.  His updated  medication list for this problem includes:    Diazepam 10 Mg Tabs (Diazepam) .Marland Kitchen... Take 1 tablet by mouth three times a day    Amitriptyline Hcl 50 Mg Tabs (Amitriptyline hcl) .Marland Kitchen... Take 1 tablet by mouth at bedtime  Orders: Est. Patient Level V (29562)  Problem # 4:  COUGH (ICD-786.2) This may be related to his symptoms of chest pain/pressure; please refer to discussion in problem #1.  His signs may reflect PNA, COPD exac, etc.  He will be evaluated for these etiologies with CXR, etc on his hospital admission.  At least 45 min with >50% face to face time was spent discussing pt numerous complaints and severe depression.  Pt was strongly encouraged to follow through with plan for hospital admission; the benefits of admission and possible risks, including death and severe, debilitating sequlae, of not proceeding with inpatient work up were discussed at length with pt.  Time was also spent coordinating care with other health care personnel in clinic and admitting hospital team.   His updated medication list for this problem includes:    Protonix 40 Mg Tbec (Pantoprazole sodium) .Marland Kitchen... Take 1 tablet by mouth two times a day    Combivent 103-18 Mcg/act Aero (Albuterol-ipratropium) .Marland Kitchen... Dose/frequency not in chart    Singulair 10 Mg Tabs (Montelukast sodium) .Marland Kitchen... Take 1 tablet by mouth once a day  Orders: Est. Patient Level V (13086)  Appended Document: c/o chest congestion and cough/jc Pt was seen and examind with Dr. Arvilla Market. I agree with the assessment and plan as outlined in her note. Mr. Spray returned to clinic with multiple complaints. He is suffering from Ithan depression related to breakup with partner and has been binge drinking, now with multiple complaints including chest pressure, pleurisy, abdominal pain, cough, wheezing. On exam he is tender to palption in his abdomen and has some expiratory wheezes. His EKG showed Q with  low voltage in III but no other Qs or ST TWave changes. He has  agreed to retrurn for admission to [facility] and workup. He clearly also needs to be seen by psychiatry.  Appended Document: Orders Update    Clinical Lists Changes  Orders: Added new Test order of 12 Lead EKG (12 Lead EKG) - Signed Added new Service order of Nebulizer Tx (57846) - Signed Added new Service order of Albuterol Sulfate Sol 1mg  unit dose (N6295) - Signed  Medication Administration  Medication # 1:    Medication: Albuterol Sulfate Sol 1mg  unit dose    Diagnosis: ACUTE BRONCHITIS (ICD-466.0)    Dose: 2.5mg /42ml    Route: po    Exp Date: 09/2011    Lot #: [id number]    Mfr: [employer]    Patient tolerated medication without complications    Given by: Kathi Simpers [facility]) (June 13, 2010 9:50 AM)  Orders Added: 1)  12 Lead EKG [12 Lead EKG] 2)  Nebulizer Tx [04540] 3)  Albuterol Sulfate Sol 1mg  unit dose [J8119]

## 2010-11-27 NOTE — Assessment & Plan Note (Signed)
Summary: Pulmonary/ new pt eval hfa 75% p coaching   Visit Type:  Initial Consult Copy to:  Dr. Sudie Bailey Primary Provider/Referring Provider:  Christain [name]  CC:  Emphysema.  History of Present Illness: 43 yowm active smoker with onset doe around  2007  July 18, 2010  1st pulmonary office eval doe x 4 year worse x 6 months to point where fast pace  x maybe a block no better with combivent assoc with congested cough worse in am's with mucoid sputum and mild heartburn.  Pt denies any significant sore throat, dysphagia, itching, sneezing,  nasal congestion or excess secretions,  fever, chills, sweats, unintended wt loss, pleuritic or exertional cp, hempoptysis, change in activity tolerance  orthopnea pnd or leg swelling Pt also denies any obvious fluctuation in symptoms with weather or environmental change or other alleviating or aggravating factors.       Current Medications (verified): 1)  Diazepam 10 Mg Tabs (Diazepam) .... Take 1 Tablet By Mouth Three Times A Day 2)  Combivent 103-18 Mcg/act Aero (Albuterol-Ipratropium) .... 2 Puffs Every 4-6 Hrs As Needed 3)  Atripla 600-200-300 Mg Tabs (Efavirenz-Emtricitab-Tenofovir) .... Take 1 Tablet By Mouth At Bedtime 4)  Hydrocodone-Acetaminophen 10-325 Mg Tabs (Hydrocodone-Acetaminophen) .... One Tablet Three Times A Day 5)  Flexeril 10 Mg Tabs (Cyclobenzaprine Hcl) .... As Directed 6)  Protonix 40 Mg Tbec (Pantoprazole Sodium) .... Take 1 Tablet By Mouth Once A Day  Allergies (verified): No Known Drug Allergies  Past History:  Past Medical History: COPD..........................................................Marland KitchenWert     - HFA 75% p coaching  HIV disease dx 53...................................Marland KitchenVolmer  GERD Depression Low back pain  Family History: No family hx of CAD. Negative for respiratory diseases or atopy   Social History: Pt reports hx of suicide attempt in the past via O/D. Lives alone  Just broke up with long  time girlfriend Sedentary Current smoker since age teenage yrs.  Smokes 1 ppd Occ maryjuana use  Occasional ETOH  Review of Systems       The patient complains of shortness of breath with activity, productive cough, non-productive cough, chest pain, acid heartburn, indigestion, loss of appetite, abdominal pain, anxiety, and depression.  The patient denies shortness of breath at rest, coughing up blood, irregular heartbeats, weight change, difficulty swallowing, sore throat, tooth/dental problems, headaches, nasal congestion/difficulty breathing through nose, sneezing, itching, ear ache, hand/feet swelling, joint stiffness or pain, rash, change in color of mucus, and fever.    Vital Signs:  Patient profile:   50 year old male Weight:      190.13 pounds O2 Sat:      96 % on Room air Temp:     97.4 degrees F oral Pulse rate:   78 / minute BP sitting:   118 / 84  (right arm)  Vitals Entered By: Vernie Murders (July 18, 2010 10:25 AM)  O2 Flow:  Room air  Physical Exam  Additional Exam:  amb wm nad  wt 184 > 190 July 18, 2010 HEENT mild turbinate edema.  Oropharynx no thrush or excess pnd or cobblestoning.  No JVD or cervical adenopathy. Mild accessory muscle hypertrophy. Trachea midline, nl thryroid. Chest was hyperinflated by percussion with diminished breath sounds and moderate increased exp time with mid exp bilateral rhonchi. Hoover sign positive at mid inspiration. Regular rate and rhythm without murmur gallop or rub or increase P2 or edema.  Abd: no hsm, nl excursion. Ext warm without cyanosis or clubbing.     CXR  Procedure date:  06/13/2010  Findings:      no acute findings  Impression & Recommendations:  Problem # 1:  DYSPNEA (ICD-786.05)    DDX of  difficult airways managment all start with A and  include Adherence, Ace Inhibitors, Acid Reflux, Active Sinus Disease, Alpha 1 Antitripsin deficiency, Anxiety masquerading as Airways dz,  ABPA,  allergy(esp in  young), Aspiration (esp in elderly), Adverse effects of DPI,  Active smokers, plus one B  = Beta blocker use.Marland Kitchen    Active smoking:  see #2  Adherence: I spent extra time with the patient today explaining optimal mdi  technique.  This improved from  25-75% with coaching  ? acid reflux:  See instructions for specific recommendations   Orders: New Patient Level V (16109)  Problem # 2:  TOBACCO ABUSE (ICD-305.1)  The following medications were removed from the medication list:    Nicoderm Cq 21 Mg/24hr Pt24 (Nicotine) .Marland Kitchen... Apply one patch once daily   I emphasized that although we never turn away smokers from the pulmonary clinic, we do ask that they understand that the recommendations that were made won't work nearly as well in the presence of continued cigarette exposure and we may reach a point where we can't help the patient if he/she can't quit smoking.    Orders: New Patient Level V (60454)  Medications Added to Medication List This Visit: 1)  Protonix 40 Mg Tbec (Pantoprazole sodium) .... Take one 30-60 min before first and last meals of the day 2)  Symbicort 160-4.5 Mcg/act Aero (Budesonide-formoterol fumarate) .... 2 puffs first thing  in am and 2 puffs again in pm about 12 hours later 3)  Combivent 103-18 Mcg/act Aero (Albuterol-ipratropium) .... 2 puffs every 4-6 hrs as needed  Patient Instructions: 1)  Protonix 40 mg Take one 30-60 min before first and last meals of the day  2)  Symbicort 160 2 puffs first thing  in am and 2 puffs again in pm about 12 hours later 3)  Work on inhaler technique:  relax and blow all the way out then take a nice smooth deep breath back in, triggering the inhaler at same time you start breathing in hold a few seconds 4)  Continue combivent 2 puffs ever 4 hours if needed 5)  GERD (REFLUX)  is a common cause of respiratory symptoms. It commonly presents without heartburn and can be treated with medication, but also with lifestyle changes including  avoidance of late meals, excessive alcohol, smoking cessation, and avoid fatty foods, chocolate, peppermint, colas, red wine, and acidic juices such as orange juice. NO MINT OR MENTHOL PRODUCTS SO NO COUGH DROPS  6)  USE SUGARLESS CANDY INSTEAD (jolley ranchers)  7)  NO OIL BASED VITAMINS  8)  Please schedule a follow-up appointment in 4 weeks, sooner if needed  with PFT's Prescriptions: SYMBICORT 160-4.5 MCG/ACT  AERO (BUDESONIDE-FORMOTEROL FUMARATE) 2 puffs first thing  in am and 2 puffs again in pm about 12 hours later  #1 x 11   Entered and Authorized by:   Nyoka Cowden MD   Signed by:   Nyoka Cowden MD on 07/18/2010   Method used:   Electronically to        [facility] [address] [phone]* (retail)       [address], [place]  [address]       Ph: [phone]       Fax: [phone]   RxID:   [id number]

## 2010-11-27 NOTE — Assessment & Plan Note (Signed)
Summary: F/U/VS   CC:  3 month follow up / congestion.  History of Present Illness: Pt having trouble getting his Atripla re-filled at the pharmacy despite having 5 refills on it.  he has been out for the last few days.  He c/o sinus congestion, ear popping and post nasal drip for several days.  No fever or chills.  Preventive Screening-Counseling & Management  Alcohol-Tobacco     Alcohol drinks/day: occassionally     Alcohol type: beer     Smoking Status: current     Smoking Cessation Counseling: yes     Packs/Day: 0.25  Caffeine-Diet-Exercise     Caffeine use/day: tea     Does Patient Exercise: yes     Type of exercise: walking     Exercise (avg: min/session): 30-60     Times/week: <3  Safety-Violence-Falls     Seat Belt Use: yes   Updated Prior Medication List: DIAZEPAM 10 MG TABS (DIAZEPAM) Take 1 tablet by mouth three times a day PROTONIX 40 MG TBEC (PANTOPRAZOLE SODIUM) Take 1 tablet by mouth two times a day AMITRIPTYLINE HCL 50 MG TABS (AMITRIPTYLINE HCL) Take 1 tablet by mouth at bedtime COMBIVENT 103-18 MCG/ACT AERO (ALBUTEROL-IPRATROPIUM) Dose/frequency not in chart ATRIPLA 600-200-300 MG TABS (EFAVIRENZ-EMTRICITAB-TENOFOVIR) Take 1 tablet by mouth at bedtime CLARITIN 10 MG  TABS (LORATADINE) Take 1 tablet by mouth once a day NICODERM CQ 7 MG/24HR PT24 (NICOTINE) apply one patch once daily SINGULAIR 10 MG TABS (MONTELUKAST SODIUM) Take 1 tablet by mouth once a day FLONASE 50 MCG/ACT SUSP (FLUTICASONE PROPIONATE) one spray two times a day AUGMENTIN 875-125 MG TABS (AMOXICILLIN-POT CLAVULANATE) Take 1 tablet by mouth two times a day  Current Allergies (reviewed today): No known allergies  Past History:  Past Medical History: Last updated: 11/13/2006 COPD HIV disease GERD Depression Low back pain  Review of Systems  The patient denies anorexia, fever, weight loss, and hemoptysis.    Vital Signs:  Patient profile:   50 year old male Height:       76 inches (193.04 cm) Weight:      192.0 pounds (87.27 kg) BMI:     23.46 Temp:     97.3 degrees F (36.28 degrees C) oral Pulse rate:   76 / minute BP sitting:   133 / 81  (left arm)  Vitals Entered By: Baxter Hire) (November 24, 2009 10:46 AM) CC: 3 month follow up / congestion Is Patient Diabetic? No Pain Assessment Patient in pain? no      Nutritional Status BMI of 19 -24 = normal Nutritional Status Detail appetite is not to good per patient  Have you ever been in a relationship where you felt threatened, hurt or afraid?No   Does patient need assistance? Functional Status Self care Ambulation Normal   Physical Exam  General:  alert, well-developed, well-nourished, and well-hydrated.   Head:  normocephalic and atraumatic.   Ears:  R ear normal and L ear normal.   Mouth:  pharynx pink and moist.   Lungs:  few scattered expiratory wheezes    Impression & Recommendations:  Problem # 1:  HIV DISEASE (ICD-042) Will obtain labs today.  Byrd Hesselbach will call his pharmacy to find out why he is having trouble getting his refills. Diagnostics Reviewed:  CD4: 530 (08/28/2009)   WBC: 5.8 (08/25/2009)   Hgb: 16.8 (08/25/2009)   HCT: 49.4 (08/25/2009)   Platelets: 201 (08/25/2009) HIV-1 RNA: <48 copies/mL (08/25/2009)   HBSAg: NO (12/22/2006)  His updated medication  list for this problem includes:    Augmentin 875-125 Mg Tabs (Amoxicillin-pot clavulanate) .Marland Kitchen... Take 1 tablet by mouth two times a day  Problem # 2:  ACUTE SINUSITIS, UNSPECIFIED (ICD-461.9) will treat with augmentin and flonase His updated medication list for this problem includes:    Flonase 50 Mcg/act Susp (Fluticasone propionate) ..... One spray two times a day    Augmentin 875-125 Mg Tabs (Amoxicillin-pot clavulanate) .Marland Kitchen... Take 1 tablet by mouth two times a day  Medications Added to Medication List This Visit: 1)  Augmentin 875-125 Mg Tabs (Amoxicillin-pot clavulanate) .... Take 1 tablet by mouth two times  a day  Other Orders: T-CD4SP ([facility]) (CD4SP) T-HIV Viral Load [id number]) T-Comprehensive Metabolic Panel [phone]) T-CBC w/Diff [phone]) T-RPR (Syphilis) ([id number]) Est. Patient Level III (28413)  Patient Instructions: 1)  Please schedule a follow-up appointment in 3 months.  Prescriptions: AUGMENTIN 875-125 MG TABS (AMOXICILLIN-POT CLAVULANATE) Take 1 tablet by mouth two times a day  #20 x 0   Entered and Authorized by:   Yisroel Ramming MD   Signed by:   Yisroel Ramming MD on 11/24/2009   Method used:   Print then Give to Patient   RxID:   [id number] FLONASE 50 MCG/ACT SUSP (FLUTICASONE PROPIONATE) one spray two times a day  #1 x 5   Entered and Authorized by:   Yisroel Ramming MD   Signed by:   Yisroel Ramming MD on 11/24/2009   Method used:   Print then Give to Patient   RxID:   [id number]  Process Orders Check Orders Results:     [facility]: Check successful Tests Sent for requisitioning (November 24, 2009 3:43 PM):     11/24/2009: [facility] -- T-HIV Viral Load [phone] (signed)     11/24/2009: [facility] -- T-Comprehensive Metabolic Panel [[id number]] (signed)     11/24/2009: [facility] -- T-CBC w/Diff [[id number]] (signed)     11/24/2009: [facility] -- T-RPR (Syphilis) [phone] (signed)

## 2010-11-27 NOTE — Assessment & Plan Note (Signed)
Summary: ***NP6 RULE OUT HEART PROBLUMES LONG TIME SMOKER PT IS HIV PO...   Visit Type:  Initial Consult Primary Provider:  Christain [name]  CC:  SOB, chest tightness, and fatigue.  History of Present Illness: 50 yo smoker with HIV recently hospitalized for epigastric and SSCP.  R/O and D/C More relief with antacids than anything else.  Still smoking a ppd with clinical COPD.  Counseled for less than 10 minutes.  HIV followed at [facility].  CD4 count in July 550 with no AIDS defining lung infections.  Has ongoing SOB with cough and wheezing.  Continues to have low substernal chest and epigastric pain.  Not on H2 or proton pump inhibitor now.  Activity also limited by multiple orthopedic issues especially in RLE.  No history of CAD.  Primary CRF is smoking  No palpitaoitns, sycncope, edema or diaphoresis  Preventive Screening-Counseling & Management  Alcohol-Tobacco     Smoking Status: current     Smoking Cessation Counseling: yes     Smoke Cessation Stage: 1 PPD  Current Problems (verified): 1)  Dyspnea  (ICD-786.05) 2)  Tobacco Abuse  (ICD-305.1) 3)  Abdominal Pain  (ICD-789.00) 4)  Chest Pain  (ICD-786.50) 5)  Acute Sinusitis, Unspecified  (ICD-461.9) 6)  Allergic Rhinitis  (ICD-477.9) 7)  Nicotine Addiction  (ICD-305.1) 8)  Night Sweats  (ICD-780.8) 9)  Acute Bronchitis  (ICD-466.0) 10)  Cough  (ICD-786.2) 11)  Low Back Pain  (ICD-724.2) 12)  Depression  (ICD-311) 13)  Drug Abuse  (ICD-305.90) 14)  Gerd  (ICD-530.81) 15)  HIV Disease  (ICD-042) 16)  COPD  (ICD-496)  Current Medications (verified): 1)  Diazepam 10 Mg Tabs (Diazepam) .... Take 1 Tablet By Mouth Three Times A Day 2)  Combivent 103-18 Mcg/act Aero (Albuterol-Ipratropium) .... Dose/frequency Not in Chart 3)  Atripla 600-200-300 Mg Tabs (Efavirenz-Emtricitab-Tenofovir) .... Take 1 Tablet By Mouth At Bedtime 4)  Flonase 50 Mcg/act Susp (Fluticasone Propionate) .... One Spray Two Times A Day 5)  Nicoderm Cq 21  Mg/24hr Pt24 (Nicotine) .... Apply One Patch Once Daily 6)  Hydrocodone-Acetaminophen 10-325 Mg Tabs (Hydrocodone-Acetaminophen) .... One Tablet Three Times A Day 7)  Flexeril 10 Mg Tabs (Cyclobenzaprine Hcl) .... As Directed 8)  Protonix 40 Mg Tbec (Pantoprazole Sodium) .... Take 1 Tablet By Mouth Once A Day  Allergies (verified): No Known Drug Allergies  Comments:  Nurse/Medical Assistant: The patient's medications and allergies were verbally reviewed with the patient and were updated in the Medication and Allergy Lists.  Past History:  Past Medical History: Last updated: 11/13/2006 COPD HIV disease GERD Depression Low back pain  Family History: Last updated: 06/12/2010 No family hx of CAD.  Social History: Last updated: 07/05/2010 Pt reports hx of suicide attempt in the past via O/D. Lives alone  Just broke up with long time girlfriend Sedentary Smokes 1 ppd Occasional ETOH  Social History: Pt reports hx of suicide attempt in the past via O/D. Lives alone  Just broke up with long time girlfriend Sedentary Smokes 1 ppd Occasional ETOH  Review of Systems       Denies fever, malais, weight loss, blurry vision, decreased visual acuity, cough, sputum,  hemoptysis, pleuritic pain, palpitaitons, heartburn, abdominal pain, melena, lower extremity edema, claudication, or rash.   Vital Signs:  Patient profile:   50 year old male Height:      76 inches Weight:      184 pounds O2 Sat:      97 % on Room air Pulse rate:  82 / minute BP sitting:   128 / 94  (left arm) Cuff size:   large  Vitals Entered By: Carlye Grippe (July 05, 2010 10:08 AM)  O2 Flow:  Room air CC: SOB,chest tightness,fatigue   Physical Exam  General:  Affect appropriate Healthy:  appears stated age HEENT: normal Neck supple with no adenopathy JVP normal no bruits no thyromegaly Lungs Activ  wheezing and good diaphragmatic motion Heart:  S1/S2 no murmur,rub, gallop or click PMI  normal Abdomen: benighn, BS positve, no tenderness, no AAA no bruit.  No HSM or HJR Distal pulses intact with no bruits No edema Neuro non-focal Skin warm and dry    Impression & Recommendations:  Problem # 1:  DYSPNEA (ICD-786.05) COPD with ongoing smoking.  Refer to [facility] smoking cessation.  F/U pulmonary No evidence of HIV related infection.  Continue inhalers Check echo to make sure no DCM Orders: 2-D Echocardiogram (2D Echo)  Problem # 2:  CHEST PAIN (ICD-786.50) Doubt cardiac Will do Dobutamine echo.  Unable to walk for orthopedic issues and lungs preclude Lexiscan Orders: Echo- Dobutamine (Dobutamine Echo)  Problem # 3:  DEPRESSION (ICD-311) F/U primary  Seen by psych in [facility] for suicidal ideation.  Currently depressed but appropriate Was supposed to start Celexa but has not Encouraged him to F/U with psych and primary for RX  Problem # 4:  HIV DISEASE (ICD-042) F/U [facility] Infections disease.  CD 4 count ok.  No fevers.  Needs surveilance for PCP  F/U pulmonary  Problem # 5:  ABDOMINAL PAIN (ICD-789.00) GERD.  With HIV may at some point need EGD.  Protonix 40mg  called in  Other Orders: Pulmonary Referral (Pulmonary)  Patient Instructions: 1)  You have been referred to [facility] in [place]. 2)  Your physician has requested that you have a dobutamine echocardiogram.  For further information please visit [email or web address]  Please follow instruction sheet as given. 3)  Your physician has requested that you have an echocardiogram.  Echocardiography is a painless test that uses sound waves to create images of your heart. It provides your doctor with information about the size and shape of your heart and how well your heart's chambers and valves are working.  This procedure takes approximately one hour. There are no restrictions for this procedure. 4)  Your physician has recommended you make the following change in your medication: START PROTONIX. Your  pescription has been sent to your pharmacy. 5)  Your physician recommends that you schedule a follow-up appointment in: as needed. Prescriptions: PROTONIX 40 MG TBEC (PANTOPRAZOLE SODIUM) Take 1 tablet by mouth once a day  #30 x 10   Entered by:   Carlye Grippe   Authorized by:   Colon Branch, MD, [facility]   Signed by:   Carlye Grippe on 07/05/2010   Method used:   Electronically to        [facility] [address] [phone]* (retail)       [address] [place], [place]  [address]       Ph: [phone]       Fax: [phone]   RxID:   [id number]    EKG Report  Procedure date:  06/13/2010  Findings:      NSR 73 Normal ECG   Appended Document: [facility]     Allergies: No Known Drug Allergies   Other Orders: EKG w/ Interpretation (93000)

## 2010-11-27 NOTE — Assessment & Plan Note (Signed)
Summary: F/U/VS   CC:  follow-up visit, lab results, and c/o sorethroat.  History of Present Illness: Pt has been under a lot of stress recently with his partner and his son losing his job. I offered counseling but at this point he wants to try to work things out himself.  I gave him Alisa's card with her contact information. Pt did not admit to being suicidal or homicidal. He c/o slight sore throat. He has been taking his Atripla regularly.  Preventive Screening-Counseling & Management  Alcohol-Tobacco     Alcohol drinks/day: occassionally     Alcohol type: beer     Smoking Status: current     Smoking Cessation Counseling: yes     Packs/Day: 1.0  Caffeine-Diet-Exercise     Caffeine use/day: tea     Does Patient Exercise: yes     Type of exercise: walking     Exercise (avg: min/session): 30-60     Times/week: <3  Safety-Violence-Falls     Seat Belt Use: yes      Sexual History:  currently monogamous.    Comments: pt. given condoms   Updated Prior Medication List: DIAZEPAM 10 MG TABS (DIAZEPAM) Take 1 tablet by mouth three times a day PROTONIX 40 MG TBEC (PANTOPRAZOLE SODIUM) Take 1 tablet by mouth two times a day AMITRIPTYLINE HCL 50 MG TABS (AMITRIPTYLINE HCL) Take 1 tablet by mouth at bedtime COMBIVENT 103-18 MCG/ACT AERO (ALBUTEROL-IPRATROPIUM) Dose/frequency not in chart ATRIPLA 600-200-300 MG TABS (EFAVIRENZ-EMTRICITAB-TENOFOVIR) Take 1 tablet by mouth at bedtime CLARITIN 10 MG  TABS (LORATADINE) Take 1 tablet by mouth once a day SINGULAIR 10 MG TABS (MONTELUKAST SODIUM) Take 1 tablet by mouth once a day FLONASE 50 MCG/ACT SUSP (FLUTICASONE PROPIONATE) one spray two times a day NICODERM CQ 21 MG/24HR PT24 (NICOTINE) apply one patch once daily HYDROCODONE-ACETAMINOPHEN 10-325 MG TABS (HYDROCODONE-ACETAMINOPHEN) one tablet three times a day FLEXERIL 10 MG TABS (CYCLOBENZAPRINE HCL) as directed  Current Allergies (reviewed today): No known allergies  Past  History:  Past Medical History: Last updated: 11/13/2006 COPD HIV disease GERD Depression Low back pain  Social History: Sexual History:  currently monogamous  Review of Systems       The patient complains of anorexia.  The patient denies fever, weight loss, and dyspnea on exertion.    Vital Signs:  Patient profile:   49 year old male Height:      76 inches (193.04 cm) Weight:      186.8 pounds (84.91 kg) BMI:     22.82 Temp:     98.1 degrees F (36.72 degrees C) oral Pulse rate:   83 / minute BP sitting:   129 / 84  (left arm)  Vitals Entered By: Wendall Mola CMA Duncan Dull) (May 24, 2010 11:12 AM) CC: follow-up visit, lab results, c/o sorethroat Is Patient Diabetic? No Pain Assessment Patient in pain? yes     Location: throat Intensity: 2 Type: aching Onset of pain  Constant Nutritional Status BMI of 19 -24 = normal Nutritional Status Detail appetite "not good"  Does patient need assistance? Functional Status Self care Ambulation Normal Comments pt. missed two doses of HAART meds since last visit, meds were late arriving   Physical Exam  General:  alert, well-developed, well-nourished, and well-hydrated.   Head:  normocephalic and atraumatic.   Mouth:  pharynx pink and moist, no erythema, and no exudates.   Lungs:  few scattered expiratory wheezes    Impression & Recommendations:  Problem # 1:  HIV  DISEASE (ICD-042) Will obtain labs today and have patient f/u in 3 months. Diagnostics Reviewed:  HIV: HIV positive - not AIDS (02/23/2010)   CD4: 640 (02/23/2010)   WBC: 6.0 (02/23/2010)   Hgb: 15.2 (02/23/2010)   HCT: 45.3 (02/23/2010)   Platelets: 187 (02/23/2010) HIV-1 RNA: <48 copies/mL (02/23/2010)   HBSAg: NO (12/22/2006)  Orders: T-Testosterone; Total [phone])  Problem # 2:  DEPRESSION (ICD-311) given info on how to contact or counselor His updated medication list for this problem includes:    Diazepam 10 Mg Tabs (Diazepam) .Marland Kitchen... Take  1 tablet by mouth three times a day    Amitriptyline Hcl 50 Mg Tabs (Amitriptyline hcl) .Marland Kitchen... Take 1 tablet by mouth at bedtime  Medications Added to Medication List This Visit: 1)  Hydrocodone-acetaminophen 10-325 Mg Tabs (Hydrocodone-acetaminophen) .... One tablet three times a day 2)  Flexeril 10 Mg Tabs (Cyclobenzaprine hcl) .... As directed  Other Orders: T-CD4SP ([facility]) (CD4SP) T-HIV Viral Load [id number]) T-Comprehensive Metabolic Panel [phone]) T-CBC w/Diff ([id number]) Est. Patient Level III (18841)  Patient Instructions: 1)  Please schedule a follow-up appointment in 3 months.

## 2010-11-27 NOTE — Assessment & Plan Note (Signed)
Summary: 64month f/u/vs   CC:  follow-up visit, lab results, needs Atripla refills, wants something to help quit smoking, and c/o pain right "lung".  History of Present Illness: Pt c/o about a week of chest congestion and pain on the right lower lung field and a cough productive of green sputum. No known fever. He saw his primary who sent him to get a CXR but he did not go and get it done. He would also like to quit smoking.  Preventive Screening-Counseling & Management  Alcohol-Tobacco     Alcohol drinks/day: occassionally     Alcohol type: beer     Smoking Status: current     Smoking Cessation Counseling: yes     Packs/Day: 1.0  Caffeine-Diet-Exercise     Caffeine use/day: tea     Does Patient Exercise: yes     Type of exercise: walking     Exercise (avg: min/session): 30-60     Times/week: <3  Safety-Violence-Falls     Seat Belt Use: yes   Updated Prior Medication List: DIAZEPAM 10 MG TABS (DIAZEPAM) Take 1 tablet by mouth three times a day PROTONIX 40 MG TBEC (PANTOPRAZOLE SODIUM) Take 1 tablet by mouth two times a day AMITRIPTYLINE HCL 50 MG TABS (AMITRIPTYLINE HCL) Take 1 tablet by mouth at bedtime COMBIVENT 103-18 MCG/ACT AERO (ALBUTEROL-IPRATROPIUM) Dose/frequency not in chart ATRIPLA 600-200-300 MG TABS (EFAVIRENZ-EMTRICITAB-TENOFOVIR) Take 1 tablet by mouth at bedtime CLARITIN 10 MG  TABS (LORATADINE) Take 1 tablet by mouth once a day SINGULAIR 10 MG TABS (MONTELUKAST SODIUM) Take 1 tablet by mouth once a day FLONASE 50 MCG/ACT SUSP (FLUTICASONE PROPIONATE) one spray two times a day NICODERM CQ 21 MG/24HR PT24 (NICOTINE) apply one patch once daily AVELOX 400 MG TABS (MOXIFLOXACIN HCL) Take 1 tablet by mouth once a day  Current Allergies (reviewed today): No known allergies  Past History:  Past Medical History: Last updated: 11/13/2006 COPD HIV disease GERD Depression Low back pain  Review of Systems  The patient denies anorexia, fever, weight loss,  dyspnea on exertion, and hemoptysis.    Vital Signs:  Patient profile:   50 year old male Height:      76 inches (193.04 cm) Weight:      188.0 pounds (85.45 kg) BMI:     22.97 O2 Sat:      97 % on Room air Temp:     97.4 degrees F (36.33 degrees C) oral Pulse rate:   80 / minute BP sitting:   143 / 87  (left arm)  Vitals Entered By: Wendall Mola CMA Duncan Dull) (February 23, 2010 10:06 AM)  O2 Flow:  Room air CC: follow-up visit, lab results, needs Atripla refills, wants something to help quit smoking, c/o pain right "lung" Is Patient Diabetic? No Pain Assessment Patient in pain? yes     Location: back, right leg Intensity: 5 Type: aching and cramping Onset of pain  Constant Nutritional Status BMI of 25 - 29 = overweight Nutritional Status Detail appetite "not good"  Does patient need assistance? Functional Status Self care Ambulation Normal Comments pt. saw primary care MD and was suppose to have chest xray for abnormal chest sounds and pt. did not go for xray   Physical Exam  General:  alert, well-developed, well-nourished, and well-hydrated.   Head:  normocephalic and atraumatic.   Mouth:  pharynx pink and moist.   Lungs:  normal breath sounds.          Medication Adherence: 02/23/2010   Adherence  to medications reviewed with patient. Counseling to provide adequate adherence provided   Prevention For Positives: 02/23/2010   Safe sex practices discussed with patient. Condoms offered.                             Impression & Recommendations:  Problem # 1:  HIV DISEASE (ICD-042) Will obtain labs today and have pt f/u in 3 months. He is encouraged to continue his Atripla and try not to miss any doses. Diagnostics Reviewed:  CD4: 540 (11/24/2009)   WBC: 4.9 (11/24/2009)   Hgb: 15.7 (11/24/2009)   HCT: 46.7 (11/24/2009)   Platelets: 207 (11/24/2009) HIV-1 RNA: 143 (11/24/2009)   HBSAg: NO (12/22/2006)  Problem # 2:  ACUTE BRONCHITIS (ICD-466.0) will  treat with avelox. His updated medication list for this problem includes:    Combivent 103-18 Mcg/act Aero (Albuterol-ipratropium) .Marland Kitchen... Dose/frequency not in chart    Singulair 10 Mg Tabs (Montelukast sodium) .Marland Kitchen... Take 1 tablet by mouth once a day    Avelox 400 Mg Tabs (Moxifloxacin hcl) .Marland Kitchen... 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 Pt encouraged not to smoke and use the patch at the same time. The following medications were removed from the medication list:    Nicoderm Cq 7 Mg/24hr Pt24 (Nicotine) .Marland Kitchen... Apply one patch once daily His updated medication list for this problem includes:    Nicoderm Cq 21 Mg/24hr Pt24 (Nicotine) .Marland Kitchen... Apply one patch once daily  Medications Added to Medication List This Visit: 1)  Nicoderm Cq 21 Mg/24hr Pt24 (Nicotine) .... Apply one patch once daily 2)  Avelox 400 Mg Tabs (Moxifloxacin hcl) .... Take 1 tablet by mouth once a day  Other Orders: T-CD4SP ([facility]) (CD4SP) T-HIV Viral Load [phone]) T-Comprehensive Metabolic Panel [id number]) T-CBC w/Diff ([id number]) Est. Patient Level IV (37902)  Patient Instructions: 1)  Please schedule a follow-up appointment in 3 months.  Prescriptions: AVELOX 400 MG TABS (MOXIFLOXACIN HCL) Take 1 tablet by mouth once a day  #10 x 0   Entered and Authorized by:   Yisroel Ramming MD   Signed by:   Yisroel Ramming MD on 02/23/2010   Method used:   Print then Give to Patient   RxID:   [id number] NICODERM CQ 21 MG/24HR PT24 (NICOTINE) apply one patch once daily  #30 x 1   Entered and Authorized by:   Yisroel Ramming MD   Signed by:   Yisroel Ramming MD on 02/23/2010   Method used:   Print then Give to Patient   RxID:   [id number] ATRIPLA 600-200-300 MG TABS (EFAVIRENZ-EMTRICITAB-TENOFOVIR) Take 1 tablet by mouth at bedtime  #30.0 Each x 5   Entered and Authorized by:   Yisroel Ramming MD   Signed by:   Yisroel Ramming MD on 02/23/2010    Method used:   Print then Give to Patient   RxID:   [id number] AVELOX 400 MG TABS (MOXIFLOXACIN HCL) Take 1 tablet by mouth once a day  #10 x 0   Entered and Authorized by:   Yisroel Ramming MD   Signed by:   Yisroel Ramming MD on 02/23/2010   Method used:   Print then Give to Patient   RxID:   [id number] NICODERM CQ 21 MG/24HR PT24 (NICOTINE) apply one patch once daily  #30 x 1   Entered and Authorized by:   Yisroel Ramming MD   Signed by:  Yisroel Ramming MD on 02/23/2010   Method used:   Print then Give to Patient   RxID:   [id number] ATRIPLA 600-200-300 MG TABS (EFAVIRENZ-EMTRICITAB-TENOFOVIR) Take 1 tablet by mouth at bedtime  #30.0 Each x 5   Entered and Authorized by:   Yisroel Ramming MD   Signed by:   Yisroel Ramming MD on 02/23/2010   Method used:   Print then Give to Patient   RxID:   [id number]

## 2010-11-29 NOTE — Progress Notes (Signed)
Summary: Reschedule appointment?  Phone Note Call from Patient Call back at [phone]   Caller: Other Relative (Tammy / fiancee) Summary of Call: pt's fiancee called to reschedule patient's stress echo from 08/09/10 / pt has no showed for this appointment three times now / do I need to reschedule him? /tg Initial call taken by: Raechel Ache [facility],  August 14, 2010 1:46 PM  Follow-up for Phone Call        Please advise. Follow-up by: Larita Fife Via LPN,  August 14, 2010 4:02 PM  Additional Follow-up for Phone Call Additional follow up Details #1::        No if he cannot make the current test date send a letter that we can no longer see him as a patient Additional Follow-up by: Colon Branch, MD, [facility],  August 16, 2010 11:04 AM    Additional Follow-up for Phone Call Additional follow up Details #2::    [place]: Dr. Eden Emms asked that the patient be dismissed from the [facility] division.  He is not back in [place] until October 03, 2010. Do you wish to catch up with him at [facility] and take care of this dismissial?

## 2010-11-29 NOTE — Miscellaneous (Signed)
  Clinical Lists Changes  Observations: Added new observation of INCOMESOURCE: UNKNOWN (11/21/2010 13:19) Added new observation of HOUSEINCOME: 0  (11/21/2010 13:19) Added new observation of HOUSING: Unknown  (11/21/2010 13:19)

## 2011-01-11 LAB — RAPID URINE DRUG SCREEN, HOSP PERFORMED
Amphetamines: NOT DETECTED
Opiates: POSITIVE — AB
Tetrahydrocannabinol: POSITIVE — AB

## 2011-01-11 LAB — CARDIAC PANEL(CRET KIN+CKTOT+MB+TROPI)
CK, MB: 1.2 ng/mL (ref 0.3–4.0)
Relative Index: 1 (ref 0.0–2.5)
Troponin I: 0.01 ng/mL (ref 0.00–0.06)

## 2011-01-11 LAB — CBC
HCT: 44.8 % (ref 39.0–52.0)
MCH: 33.2 pg (ref 26.0–34.0)
MCV: 93.2 fL (ref 78.0–100.0)
Platelets: 191 10*3/uL (ref 150–400)
Platelets: 200 10*3/uL (ref 150–400)
RBC: 4.82 MIL/uL (ref 4.22–5.81)
RDW: 12 % (ref 11.5–15.5)
RDW: 12.2 % (ref 11.5–15.5)
WBC: 5.8 10*3/uL (ref 4.0–10.5)
WBC: 7.1 10*3/uL (ref 4.0–10.5)

## 2011-01-11 LAB — COMPREHENSIVE METABOLIC PANEL
ALT: 17 U/L (ref 0–53)
AST: 22 U/L (ref 0–37)
Albumin: 3.7 g/dL (ref 3.5–5.2)
Alkaline Phosphatase: 83 U/L (ref 39–117)
GFR calc Af Amer: >60 mL/min (ref >60)
Glucose, Bld: 137 mg/dL — ABNORMAL HIGH (ref 70–99)
Potassium: 3.7 mEq/L (ref 3.5–5.1)
Sodium: 145 mEq/L (ref 135–145)
Total Protein: 6.2 g/dL (ref 6.0–8.3)

## 2011-01-11 LAB — URINALYSIS, MICROSCOPIC ONLY
Bilirubin Urine: NEGATIVE
Ketones, ur: NEGATIVE mg/dL
Leukocytes, UA: NEGATIVE
Nitrite: NEGATIVE
Urobilinogen, UA: 0.2 mg/dL (ref 0.0–1.0)
pH: 6 (ref 5.0–8.0)

## 2011-01-11 LAB — DIFFERENTIAL
Basophils Relative: 1 % (ref 0–1)
Eosinophils Absolute: 0.3 10*3/uL (ref 0.0–0.7)
Eosinophils Relative: 4 % (ref 0–5)
Lymphs Abs: 2.4 10*3/uL (ref 0.7–4.0)
Monocytes Absolute: 0.3 10*3/uL (ref 0.1–1.0)
Monocytes Relative: 5 % (ref 3–12)

## 2011-01-11 LAB — LIPID PANEL
HDL: 33 mg/dL — ABNORMAL LOW (ref >39)
LDL Cholesterol: 65 mg/dL (ref 0–99)
Triglycerides: 257 mg/dL — ABNORMAL HIGH (ref <150)
VLDL: 51 mg/dL — ABNORMAL HIGH (ref 0–40)

## 2011-01-11 LAB — BASIC METABOLIC PANEL
BUN: 7 mg/dL (ref 6–23)
Calcium: 8.5 mg/dL (ref 8.4–10.5)
Creatinine, Ser: 1.07 mg/dL (ref 0.4–1.5)
GFR calc Af Amer: >60 mL/min (ref >60)

## 2011-01-12 LAB — T-HELPER CELL (CD4) - (RCID CLINIC ONLY)
CD4 % Helper T Cell: 38 % (ref 33–55)
CD4 T Cell Abs: 550 uL (ref 400–2700)

## 2011-01-14 LAB — T-HELPER CELL (CD4) - (RCID CLINIC ONLY)
CD4 % Helper T Cell: 38 % (ref 33–55)
CD4 T Cell Abs: 540 uL (ref 400–2700)

## 2011-01-23 ENCOUNTER — Other Ambulatory Visit: Payer: Self-pay | Admitting: *Deleted

## 2011-01-23 ENCOUNTER — Encounter: Payer: Self-pay | Admitting: Infectious Disease

## 2011-01-23 DIAGNOSIS — B20 Human immunodeficiency virus [HIV] disease: Secondary | ICD-10-CM

## 2011-01-23 MED ORDER — EFAVIRENZ-EMTRICITAB-TENOFOVIR 600-200-300 MG PO TABS: 1.0000 | ORAL_TABLET | Freq: Every day | ORAL | Status: DC

## 2011-01-31 LAB — T-HELPER CELL (CD4) - (RCID CLINIC ONLY): CD4 T Cell Abs: 530 uL (ref 400–2700)

## 2011-02-03 LAB — T-HELPER CELL (CD4) - (RCID CLINIC ONLY): CD4 % Helper T Cell: 35 % (ref 33–55)

## 2011-02-06 LAB — T-HELPER CELL (CD4) - (RCID CLINIC ONLY)
CD4 % Helper T Cell: 37 % (ref 33–55)
CD4 T Cell Abs: 680 uL (ref 400–2700)

## 2011-02-11 LAB — T-HELPER CELL (CD4) - (RCID CLINIC ONLY)
CD4 % Helper T Cell: 34 % (ref 33–55)
CD4 T Cell Abs: 720 uL (ref 400–2700)

## 2011-02-20 ENCOUNTER — Other Ambulatory Visit: Payer: Self-pay | Admitting: *Deleted

## 2011-02-20 DIAGNOSIS — B20 Human immunodeficiency virus [HIV] disease: Secondary | ICD-10-CM

## 2011-02-20 MED ORDER — EFAVIRENZ-EMTRICITAB-TENOFOVIR 600-200-300 MG PO TABS: 1.0000 | ORAL_TABLET | Freq: Every day | ORAL | Status: DC

## 2011-02-20 NOTE — Telephone Encounter (Signed)
Patient called adv he is out of his meds. Took the last one yesterday. Adv he is aware that he has not been in since last year and needs to make an appt. Told him I would refill his meds with no refills and give him time to make and keep and appt. Transferred patient to the front and reminded him to make lab appt for next week and a follow-up for 2 weeks after that.

## 2011-02-25 ENCOUNTER — Other Ambulatory Visit: Payer: Self-pay

## 2011-03-26 ENCOUNTER — Encounter: Payer: Self-pay | Admitting: Infectious Diseases

## 2011-03-26 ENCOUNTER — Ambulatory Visit (INDEPENDENT_AMBULATORY_CARE_PROVIDER_SITE_OTHER): Payer: PRIVATE HEALTH INSURANCE | Admitting: Infectious Diseases

## 2011-03-26 DIAGNOSIS — Z113 Encounter for screening for infections with a predominantly sexual mode of transmission: Secondary | ICD-10-CM

## 2011-03-26 DIAGNOSIS — Z79899 Other long term (current) drug therapy: Secondary | ICD-10-CM

## 2011-03-26 DIAGNOSIS — M545 Low back pain, unspecified: Secondary | ICD-10-CM

## 2011-03-26 DIAGNOSIS — F17200 Nicotine dependence, unspecified, uncomplicated: Secondary | ICD-10-CM

## 2011-03-26 DIAGNOSIS — Z23 Encounter for immunization: Secondary | ICD-10-CM

## 2011-03-26 DIAGNOSIS — B20 Human immunodeficiency virus [HIV] disease: Secondary | ICD-10-CM

## 2011-03-26 LAB — LIPID PANEL
Cholesterol: 171 mg/dL (ref 0–200)
LDL Cholesterol: 94 mg/dL (ref 0–99)
Total CHOL/HDL Ratio: 5 Ratio
VLDL: 43 mg/dL — ABNORMAL HIGH (ref 0–40)

## 2011-03-26 LAB — COMPREHENSIVE METABOLIC PANEL
ALT: 20 U/L (ref 0–53)
AST: 19 U/L (ref 0–37)
Alkaline Phosphatase: 81 U/L (ref 39–117)
CO2: 26 mEq/L (ref 19–32)
Creat: 1.13 mg/dL (ref 0.40–1.50)
Sodium: 140 mEq/L (ref 135–145)
Total Bilirubin: 0.2 mg/dL — ABNORMAL LOW (ref 0.3–1.2)
Total Protein: 6.5 g/dL (ref 6.0–8.3)

## 2011-03-26 LAB — CBC
Hemoglobin: 15.4 g/dL (ref 13.0–17.0)
MCH: 32.6 pg (ref 26.0–34.0)
Platelets: 193 10*3/uL (ref 150–400)
RBC: 4.72 MIL/uL (ref 4.22–5.81)

## 2011-03-26 LAB — RPR

## 2011-03-26 NOTE — Assessment & Plan Note (Signed)
He is doing ok, await tests for STDs. He is given condoms today. Will see him back in 4-5 months, he wants to have his blood work done same day as he lives in [place].

## 2011-03-26 NOTE — Progress Notes (Signed)
Addended by: Wendall Mola A on: 03/26/2011 12:15 PM   Modules accepted: Orders
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
## 2011-03-26 NOTE — Assessment & Plan Note (Signed)
He is encouraged to quit.

## 2011-03-27 LAB — HEPATITIS A ANTIBODY, TOTAL: Hep A Total Ab: NEGATIVE

## 2011-03-27 LAB — T-HELPER CELL (CD4) - (RCID CLINIC ONLY)
CD4 % Helper T Cell: 38 % (ref 33–55)
CD4 T Cell Abs: 820 uL (ref 400–2700)

## 2011-03-27 LAB — CULTURE, URINE COMPREHENSIVE
Colony Count: NO GROWTH
Organism ID, Bacteria: NO GROWTH

## 2011-03-27 LAB — GC/CHLAMYDIA PROBE AMP, URINE: Chlamydia, Swab/Urine, PCR: NEGATIVE

## 2011-03-27 LAB — HIV-1 RNA QUANT-NO REFLEX-BLD: HIV 1 RNA Quant: <20 copies/mL (ref <20)

## 2011-04-23 ENCOUNTER — Other Ambulatory Visit: Payer: Self-pay | Admitting: Internal Medicine

## 2011-04-23 ENCOUNTER — Other Ambulatory Visit: Payer: Self-pay | Admitting: *Deleted

## 2011-04-23 DIAGNOSIS — J302 Other seasonal allergic rhinitis: Secondary | ICD-10-CM

## 2011-04-23 NOTE — Telephone Encounter (Signed)
rec'd 2 faxes from his pharmacy asking for singulair. This was d/c 07/05/10. Do you want it added back?

## 2011-04-24 MED ORDER — MONTELUKAST SODIUM 10 MG PO TABS: 10.0000 mg | ORAL_TABLET | Freq: Every day | ORAL | Status: DC

## 2011-04-25 ENCOUNTER — Ambulatory Visit: Payer: Self-pay

## 2011-08-14 LAB — BASIC METABOLIC PANEL
BUN: 11
Chloride: 107
Glucose, Bld: 119 — ABNORMAL HIGH
Potassium: 3.6

## 2011-08-14 LAB — T-HELPER CELL (CD4) - (RCID CLINIC ONLY): CD4 T Cell Abs: 470

## 2011-08-20 ENCOUNTER — Ambulatory Visit: Payer: PRIVATE HEALTH INSURANCE | Admitting: Infectious Diseases

## 2011-08-21 ENCOUNTER — Encounter: Payer: Self-pay | Admitting: Infectious Diseases

## 2011-08-21 ENCOUNTER — Ambulatory Visit (INDEPENDENT_AMBULATORY_CARE_PROVIDER_SITE_OTHER): Payer: PRIVATE HEALTH INSURANCE | Admitting: Infectious Diseases

## 2011-08-21 DIAGNOSIS — K047 Periapical abscess without sinus: Secondary | ICD-10-CM

## 2011-08-21 DIAGNOSIS — B20 Human immunodeficiency virus [HIV] disease: Secondary | ICD-10-CM

## 2011-08-21 DIAGNOSIS — Z79899 Other long term (current) drug therapy: Secondary | ICD-10-CM

## 2011-08-21 DIAGNOSIS — Z113 Encounter for screening for infections with a predominantly sexual mode of transmission: Secondary | ICD-10-CM

## 2011-08-21 DIAGNOSIS — F17200 Nicotine dependence, unspecified, uncomplicated: Secondary | ICD-10-CM

## 2011-08-21 DIAGNOSIS — Z23 Encounter for immunization: Secondary | ICD-10-CM

## 2011-08-21 DIAGNOSIS — K044 Acute apical periodontitis of pulpal origin: Secondary | ICD-10-CM

## 2011-08-21 MED ORDER — AMOXICILLIN-POT CLAVULANATE 875-125 MG PO TABS: 1.0000 | ORAL_TABLET | Freq: Two times a day (BID) | ORAL | Status: AC

## 2011-08-21 NOTE — Assessment & Plan Note (Addendum)
He appears to be doing well. Is given condoms, gets flu shot today. Try to complete Hep B series in this year. Start Hep A series. Will see him back in 4-5 months with labs prior.

## 2011-08-21 NOTE — Progress Notes (Signed)
Addended by: Mariea Clonts D on: 08/21/2011 03:44 PM   Modules accepted: Orders

## 2011-08-21 NOTE — Assessment & Plan Note (Signed)
Will give him rx for augmentin. He will f/u with his dentist.

## 2011-08-21 NOTE — Progress Notes (Signed)
  Subjective:    Patient ID: Quavis O Nuon, male    DOB: 10-05-1961, 50 y.o.   MRN: [id number]  HPI 50 yo M with HIV dx 1998, last CD4 820, VL <20 (03-26-11). Also hx of COPD. Has been on atripla for ~ 4 years (prev took CBV/EFV).  Needs to see dentist as he had extraction from his L mandible, now having severe pain on jaw and side of face.    Review of Systems  Constitutional: Negative for fever and chills.  HENT: Positive for dental problem.   Respiratory: Positive for cough.        Objective:   Physical Exam  Constitutional: He appears well-developed and well-nourished.  HENT:  Mouth/Throat:    Eyes: EOM are normal. Pupils are equal, round, and reactive to light.  Neck: Neck supple.  Cardiovascular: Normal rate, regular rhythm and normal heart sounds.   Pulmonary/Chest: Effort normal and breath sounds normal.  Abdominal: Soft. Bowel sounds are normal. There is no tenderness. There is no rebound.  Lymphadenopathy:    He has no cervical adenopathy.          Assessment & Plan:

## 2011-08-21 NOTE — Assessment & Plan Note (Signed)
Encouraged to quit. 

## 2011-08-22 LAB — COMPLETE METABOLIC PANEL WITH GFR
Albumin: 4.6 g/dL (ref 3.5–5.2)
Alkaline Phosphatase: 77 U/L (ref 39–117)
BUN: 11 mg/dL (ref 6–23)
Creat: 1.12 mg/dL (ref 0.50–1.35)
GFR, Est Non African American: 77 mL/min — ABNORMAL LOW (ref >90)
Glucose, Bld: 90 mg/dL (ref 70–99)
Total Bilirubin: 0.4 mg/dL (ref 0.3–1.2)

## 2011-08-22 LAB — LIPID PANEL
Cholesterol: 183 mg/dL (ref 0–200)
HDL: 32 mg/dL — ABNORMAL LOW (ref >39)
Total CHOL/HDL Ratio: 5.7 Ratio
Triglycerides: 176 mg/dL — ABNORMAL HIGH (ref <150)

## 2011-08-22 LAB — CBC
HCT: 43.9 % (ref 39.0–52.0)
Hemoglobin: 15.3 g/dL (ref 13.0–17.0)
MCH: 32.3 pg (ref 26.0–34.0)
MCHC: 34.9 g/dL (ref 30.0–36.0)
MCV: 92.6 fL (ref 78.0–100.0)

## 2011-08-22 LAB — T-HELPER CELL (CD4) - (RCID CLINIC ONLY): CD4 T Cell Abs: 640 uL (ref 400–2700)

## 2012-01-27 ENCOUNTER — Other Ambulatory Visit: Payer: Self-pay | Admitting: *Deleted

## 2012-01-27 DIAGNOSIS — B20 Human immunodeficiency virus [HIV] disease: Secondary | ICD-10-CM

## 2012-01-27 MED ORDER — EFAVIRENZ-EMTRICITAB-TENOFOVIR 600-200-300 MG PO TABS: 1.0000 | ORAL_TABLET | Freq: Every day | ORAL | Status: DC

## 2012-03-11 ENCOUNTER — Other Ambulatory Visit: Payer: PRIVATE HEALTH INSURANCE

## 2012-04-03 ENCOUNTER — Ambulatory Visit: Payer: PRIVATE HEALTH INSURANCE | Admitting: Infectious Diseases

## 2012-04-06 ENCOUNTER — Encounter: Payer: Self-pay | Admitting: Infectious Diseases

## 2012-04-06 ENCOUNTER — Ambulatory Visit (INDEPENDENT_AMBULATORY_CARE_PROVIDER_SITE_OTHER): Payer: PRIVATE HEALTH INSURANCE | Admitting: Infectious Diseases

## 2012-04-06 VITALS — BP 138/88 | HR 80 | Temp 97.4°F | Ht 75.0 in | Wt 190.0 lb

## 2012-04-06 DIAGNOSIS — Z79899 Other long term (current) drug therapy: Secondary | ICD-10-CM

## 2012-04-06 DIAGNOSIS — B20 Human immunodeficiency virus [HIV] disease: Secondary | ICD-10-CM

## 2012-04-06 DIAGNOSIS — Z113 Encounter for screening for infections with a predominantly sexual mode of transmission: Secondary | ICD-10-CM

## 2012-04-06 DIAGNOSIS — F17200 Nicotine dependence, unspecified, uncomplicated: Secondary | ICD-10-CM

## 2012-04-06 DIAGNOSIS — J449 Chronic obstructive pulmonary disease, unspecified: Secondary | ICD-10-CM

## 2012-04-06 DIAGNOSIS — Z23 Encounter for immunization: Secondary | ICD-10-CM

## 2012-04-06 LAB — CBC WITH DIFFERENTIAL/PLATELET
Basophils Absolute: 0 10*3/uL (ref 0.0–0.1)
Basophils Relative: 1 % (ref 0–1)
Hemoglobin: 16.4 g/dL (ref 13.0–17.0)
MCHC: 35 g/dL (ref 30.0–36.0)
Monocytes Relative: 7 % (ref 3–12)
Neutro Abs: 3.1 10*3/uL (ref 1.7–7.7)
Neutrophils Relative %: 56 % (ref 43–77)
WBC: 5.4 10*3/uL (ref 4.0–10.5)

## 2012-04-06 LAB — COMPREHENSIVE METABOLIC PANEL
AST: 19 U/L (ref 0–37)
Albumin: 4.6 g/dL (ref 3.5–5.2)
Alkaline Phosphatase: 104 U/L (ref 39–117)
Glucose, Bld: 92 mg/dL (ref 70–99)
Potassium: 3.9 mEq/L (ref 3.5–5.3)
Sodium: 142 mEq/L (ref 135–145)
Total Protein: 7 g/dL (ref 6.0–8.3)

## 2012-04-06 LAB — LIPID PANEL: LDL Cholesterol: 110 mg/dL — ABNORMAL HIGH (ref 0–99)

## 2012-04-06 NOTE — Assessment & Plan Note (Signed)
He is doing well. Will send him to the lab today. Offered/refused condoms. occas missed doses (1-2/month). PNVx next visit.

## 2012-04-06 NOTE — Progress Notes (Signed)
  Subjective:    Patient ID: Devin [name], male    DOB: 28-Aug-1961, 51 y.o.   MRN: [id number]  HPI 51 yo M with HIV dx 1998, and hx of COPD. Has been on atripla for ~ 4 years (prev took CBV/EFV). Been feeling well, tired today after sons birthday yesterday (38 yo).   HIV 1 RNA Quant (copies/mL)  Date Value  08/21/2011 Comment: HIV 1 RNA not detected.   03/26/2011 <20   05/24/2010 <48 copies/mL      CD4 T Cell Abs (cmm)  Date Value  08/21/2011 640   03/26/2011 820   05/24/2010 550       Review of Systems  Constitutional: Negative for appetite change and unexpected weight change.       Eats once daily  Respiratory: Negative for cough and shortness of breath.   Gastrointestinal: Negative for diarrhea and constipation.  Genitourinary: Negative for dysuria.  Neurological: Negative for headaches.       Objective:   Physical Exam  Constitutional: He appears well-developed and well-nourished.  HENT:  Mouth/Throat: No oropharyngeal exudate.  Eyes: EOM are normal. Pupils are equal, round, and reactive to light.  Cardiovascular: Normal rate, regular rhythm and normal heart sounds.   Pulmonary/Chest: Effort normal and breath sounds normal. No respiratory distress. He has no wheezes.  Abdominal: Soft. Bowel sounds are normal. He exhibits no distension. There is no tenderness.  Lymphadenopathy:    He has no cervical adenopathy.          Assessment & Plan:

## 2012-04-06 NOTE — Assessment & Plan Note (Signed)
Seems stable. Will cont to monitor.

## 2012-04-06 NOTE — Assessment & Plan Note (Signed)
Encourage to quit 

## 2012-04-07 LAB — HIV-1 RNA ULTRAQUANT REFLEX TO GENTYP+
HIV 1 RNA Quant: <20 copies/mL (ref <20)
HIV-1 RNA Quant, Log: <1.3 {Log} (ref <1.30)

## 2012-04-08 LAB — T-HELPER CELL (CD4) - (RCID CLINIC ONLY): CD4 T Cell Abs: 710 uL (ref 400–2700)

## 2012-05-13 ENCOUNTER — Other Ambulatory Visit (HOSPITAL_COMMUNITY): Payer: Self-pay | Admitting: Family Medicine

## 2012-05-13 ENCOUNTER — Ambulatory Visit (HOSPITAL_COMMUNITY)
Admission: RE | Admit: 2012-05-13 | Discharge: 2012-05-13 | Disposition: A | Discharge status: Home / Self Care | Payer: PRIVATE HEALTH INSURANCE | Source: Ambulatory Visit | Attending: Family Medicine | Admitting: Family Medicine

## 2012-05-13 DIAGNOSIS — R52 Pain, unspecified: Secondary | ICD-10-CM

## 2012-05-13 DIAGNOSIS — W2209XA Striking against other stationary object, initial encounter: Secondary | ICD-10-CM | POA: Insufficient documentation

## 2012-05-13 DIAGNOSIS — M25529 Pain in unspecified elbow: Secondary | ICD-10-CM | POA: Insufficient documentation

## 2012-05-21 ENCOUNTER — Telehealth: Payer: Self-pay | Admitting: *Deleted

## 2012-05-21 NOTE — Telephone Encounter (Signed)
Pt reports recently stepping on a nail.  Wondering about his Tetanus immunization status.  RN reviewed both EPIC and Centricity immunization information.  This office has no record of the pt receiving a Tetanus vaccine.  Returned call to pt.  Left message with this information.  RN advised pt to go to Urgent Care for evaluation and treatment.  No appts available for the remainder of this week at [facility].  Encouraged pt to call [facility] if he has questions.   RN phoned the pt again.  Pt answered the phone.  He went to his PCP, Dr. Sudie Bailey, yesterday and received a TDAP vaccine.  RN advised the pt to keep the wound as clean as possible until it is entirely healed.  Pt verbalized understanding.

## 2012-09-07 ENCOUNTER — Other Ambulatory Visit: Payer: Self-pay | Admitting: *Deleted

## 2012-09-07 DIAGNOSIS — B20 Human immunodeficiency virus [HIV] disease: Secondary | ICD-10-CM

## 2012-09-07 MED ORDER — EFAVIRENZ-EMTRICITAB-TENOFOVIR 600-200-300 MG PO TABS: 1.0000 | ORAL_TABLET | Freq: Every day | ORAL | Status: DC

## 2012-10-05 ENCOUNTER — Ambulatory Visit (INDEPENDENT_AMBULATORY_CARE_PROVIDER_SITE_OTHER): Payer: PRIVATE HEALTH INSURANCE | Admitting: Infectious Diseases

## 2012-10-05 ENCOUNTER — Encounter: Payer: Self-pay | Admitting: Infectious Diseases

## 2012-10-05 VITALS — BP 137/90 | HR 92 | Temp 98.0°F | Ht 75.0 in | Wt 183.0 lb

## 2012-10-05 DIAGNOSIS — B20 Human immunodeficiency virus [HIV] disease: Secondary | ICD-10-CM

## 2012-10-05 DIAGNOSIS — J209 Acute bronchitis, unspecified: Secondary | ICD-10-CM

## 2012-10-05 DIAGNOSIS — F17200 Nicotine dependence, unspecified, uncomplicated: Secondary | ICD-10-CM

## 2012-10-05 LAB — CBC
Platelets: 228 10*3/uL (ref 150–400)
RBC: 5.07 MIL/uL (ref 4.22–5.81)
WBC: 6.6 10*3/uL (ref 4.0–10.5)

## 2012-10-05 LAB — COMPREHENSIVE METABOLIC PANEL
ALT: 19 U/L (ref 0–53)
CO2: 27 mEq/L (ref 19–32)
Calcium: 9.3 mg/dL (ref 8.4–10.5)
Chloride: 105 mEq/L (ref 96–112)
Sodium: 141 mEq/L (ref 135–145)
Total Bilirubin: 0.5 mg/dL (ref 0.3–1.2)
Total Protein: 6.4 g/dL (ref 6.0–8.3)

## 2012-10-05 MED ORDER — LEVOFLOXACIN 500 MG PO TABS: 500.0000 mg | ORAL_TABLET | Freq: Every day | ORAL | Status: DC

## 2012-10-05 NOTE — Assessment & Plan Note (Signed)
Encouraged to quit, says he will
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
## 2012-10-05 NOTE — Assessment & Plan Note (Signed)
Will give him short course of levaqiun.

## 2012-10-05 NOTE — Assessment & Plan Note (Signed)
He has missed some pills due to stress, taking care of his elderly father. New girlfriend, does not want to use condoms (her or him). He is given condoms today. Will see him back in 6 months.

## 2012-10-07 LAB — HIV-1 RNA ULTRAQUANT REFLEX TO GENTYP+: HIV 1 RNA Quant: <20 copies/mL (ref <20)

## 2013-04-12 ENCOUNTER — Ambulatory Visit (INDEPENDENT_AMBULATORY_CARE_PROVIDER_SITE_OTHER): Payer: PRIVATE HEALTH INSURANCE | Admitting: Infectious Diseases

## 2013-04-12 ENCOUNTER — Encounter: Payer: Self-pay | Admitting: Infectious Diseases

## 2013-04-12 VITALS — BP 170/82 | HR 87 | Temp 98.2°F | Ht 74.0 in | Wt 189.0 lb

## 2013-04-12 DIAGNOSIS — I10 Essential (primary) hypertension: Secondary | ICD-10-CM

## 2013-04-12 DIAGNOSIS — M545 Low back pain, unspecified: Secondary | ICD-10-CM

## 2013-04-12 DIAGNOSIS — B20 Human immunodeficiency virus [HIV] disease: Secondary | ICD-10-CM

## 2013-04-12 LAB — CBC
HCT: 44.9 % (ref 39.0–52.0)
Hemoglobin: 16.1 g/dL (ref 13.0–17.0)
MCV: 89.6 fL (ref 78.0–100.0)
RDW: 13.6 % (ref 11.5–15.5)
WBC: 8.5 10*3/uL (ref 4.0–10.5)

## 2013-04-12 LAB — COMPREHENSIVE METABOLIC PANEL
AST: 24 U/L (ref 0–37)
BUN: 12 mg/dL (ref 6–23)
Calcium: 9.3 mg/dL (ref 8.4–10.5)
Chloride: 109 mEq/L (ref 96–112)
Creat: 1.13 mg/dL (ref 0.50–1.35)
Glucose, Bld: 97 mg/dL (ref 70–99)

## 2013-04-12 LAB — LIPID PANEL
Cholesterol: 164 mg/dL (ref 0–200)
HDL: 39 mg/dL — ABNORMAL LOW (ref >39)
LDL Cholesterol: 100 mg/dL — ABNORMAL HIGH (ref 0–99)
Triglycerides: 124 mg/dL (ref <150)

## 2013-04-12 LAB — RPR

## 2013-04-12 MED ORDER — ATENOLOL 25 MG PO TABS: 25.0000 mg | ORAL_TABLET | Freq: Every day | ORAL | Status: DC

## 2013-04-12 MED ORDER — CLONIDINE HCL 0.1 MG PO TABS
0.1000 mg | ORAL_TABLET | Freq: Once | ORAL | Status: AC
  Administered 2013-04-12: 0.1 mg via ORAL

## 2013-04-12 NOTE — Addendum Note (Signed)
Addended by: Wendall Mola A on: 04/12/2013 04:13 PM   Modules accepted: Orders

## 2013-04-12 NOTE — Assessment & Plan Note (Signed)
He is doing well. Will continue his current art. Will check his labs today. Offered condoms. Will recheck his Hep B S Ab

## 2013-04-12 NOTE — Addendum Note (Signed)
Addended by: Mariea Clonts D on: 04/12/2013 04:05 PM   Modules accepted: Orders

## 2013-04-12 NOTE — Assessment & Plan Note (Signed)
He needs eval by ortho for this. Not clear if he is surgical candidate.
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
## 2013-04-12 NOTE — Assessment & Plan Note (Signed)
Will give him clonidine now. Start him on low dose beta-blocker.

## 2013-04-13 LAB — T-HELPER CELL (CD4) - (RCID CLINIC ONLY): CD4 % Helper T Cell: 36 % (ref 33–55)

## 2013-04-13 LAB — HIV-1 RNA QUANT-NO REFLEX-BLD: HIV 1 RNA Quant: <20 copies/mL (ref <20)

## 2013-04-19 ENCOUNTER — Telehealth: Payer: Self-pay | Admitting: *Deleted

## 2013-04-19 DIAGNOSIS — B20 Human immunodeficiency virus [HIV] disease: Secondary | ICD-10-CM

## 2013-04-19 MED ORDER — EFAVIRENZ-EMTRICITAB-TENOFOVIR 600-200-300 MG PO TABS: 1.0000 | ORAL_TABLET | Freq: Every day | ORAL | Status: DC
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
## 2013-04-26 ENCOUNTER — Encounter: Payer: Self-pay | Admitting: *Deleted

## 2013-04-26 DIAGNOSIS — Z79899 Other long term (current) drug therapy: Secondary | ICD-10-CM | POA: Insufficient documentation

## 2013-05-10 ENCOUNTER — Ambulatory Visit: Payer: PRIVATE HEALTH INSURANCE | Admitting: Internal Medicine

## 2013-05-25 ENCOUNTER — Ambulatory Visit (HOSPITAL_COMMUNITY)
Admission: RE | Admit: 2013-05-25 | Discharge: 2013-05-25 | Disposition: A | Discharge status: Home / Self Care | Payer: PRIVATE HEALTH INSURANCE | Source: Ambulatory Visit | Attending: Family Medicine | Admitting: Family Medicine

## 2013-05-25 ENCOUNTER — Other Ambulatory Visit (HOSPITAL_COMMUNITY): Payer: Self-pay | Admitting: Family Medicine

## 2013-05-25 DIAGNOSIS — M549 Dorsalgia, unspecified: Secondary | ICD-10-CM

## 2013-05-25 DIAGNOSIS — M47814 Spondylosis without myelopathy or radiculopathy, thoracic region: Secondary | ICD-10-CM | POA: Insufficient documentation

## 2013-05-25 DIAGNOSIS — M4120 Other idiopathic scoliosis, site unspecified: Secondary | ICD-10-CM | POA: Insufficient documentation

## 2013-05-25 DIAGNOSIS — M546 Pain in thoracic spine: Secondary | ICD-10-CM | POA: Insufficient documentation

## 2013-08-24 ENCOUNTER — Emergency Department (HOSPITAL_COMMUNITY)
Admission: EM | Admit: 2013-08-24 | Discharge: 2013-08-24 | Disposition: A | Discharge status: Other Acute Inpatient Hospital | Payer: PRIVATE HEALTH INSURANCE | Attending: Emergency Medicine | Admitting: Emergency Medicine

## 2013-08-24 ENCOUNTER — Encounter (HOSPITAL_COMMUNITY): Payer: Self-pay | Admitting: Emergency Medicine

## 2013-08-24 DIAGNOSIS — F17200 Nicotine dependence, unspecified, uncomplicated: Secondary | ICD-10-CM | POA: Insufficient documentation

## 2013-08-24 DIAGNOSIS — IMO0002 Reserved for concepts with insufficient information to code with codable children: Secondary | ICD-10-CM | POA: Insufficient documentation

## 2013-08-24 DIAGNOSIS — M419 Scoliosis, unspecified: Secondary | ICD-10-CM

## 2013-08-24 DIAGNOSIS — T43502A Poisoning by unspecified antipsychotics and neuroleptics, intentional self-harm, initial encounter: Secondary | ICD-10-CM | POA: Insufficient documentation

## 2013-08-24 DIAGNOSIS — T424X4A Poisoning by benzodiazepines, undetermined, initial encounter: Secondary | ICD-10-CM | POA: Insufficient documentation

## 2013-08-24 DIAGNOSIS — F911 Conduct disorder, childhood-onset type: Secondary | ICD-10-CM | POA: Insufficient documentation

## 2013-08-24 DIAGNOSIS — F1010 Alcohol abuse, uncomplicated: Secondary | ICD-10-CM | POA: Insufficient documentation

## 2013-08-24 DIAGNOSIS — Z8739 Personal history of other diseases of the musculoskeletal system and connective tissue: Secondary | ICD-10-CM | POA: Insufficient documentation

## 2013-08-24 DIAGNOSIS — Z79899 Other long term (current) drug therapy: Secondary | ICD-10-CM | POA: Insufficient documentation

## 2013-08-24 DIAGNOSIS — Z21 Asymptomatic human immunodeficiency virus [HIV] infection status: Secondary | ICD-10-CM | POA: Insufficient documentation

## 2013-08-24 HISTORY — DX: Scoliosis, unspecified: M41.9

## 2013-08-24 HISTORY — DX: Other chronic pain: G89.29

## 2013-08-24 HISTORY — DX: Reserved for concepts with insufficient information to code with codable children: IMO0002

## 2013-08-24 HISTORY — DX: Essential (primary) hypertension: I10

## 2013-08-24 HISTORY — DX: Other chronic pain: M54.9

## 2013-08-24 LAB — RAPID URINE DRUG SCREEN, HOSP PERFORMED
Barbiturates: NOT DETECTED
Tetrahydrocannabinol: NOT DETECTED

## 2013-08-24 LAB — COMPREHENSIVE METABOLIC PANEL
ALT: 15 U/L (ref 0–53)
AST: 20 U/L (ref 0–37)
Alkaline Phosphatase: 104 U/L (ref 39–117)
BUN: 9 mg/dL (ref 6–23)
CO2: 22 mEq/L (ref 19–32)
Calcium: 8.7 mg/dL (ref 8.4–10.5)
Chloride: 107 mEq/L (ref 96–112)
GFR calc Af Amer: >90 mL/min (ref >90)
GFR calc non Af Amer: >90 mL/min (ref >90)
Glucose, Bld: 122 mg/dL — ABNORMAL HIGH (ref 70–99)
Potassium: 3.3 mEq/L — ABNORMAL LOW (ref 3.5–5.1)
Sodium: 140 mEq/L (ref 135–145)
Total Bilirubin: 0.2 mg/dL — ABNORMAL LOW (ref 0.3–1.2)

## 2013-08-24 LAB — CBC
Hemoglobin: 15.2 g/dL (ref 13.0–17.0)
MCH: 33.7 pg (ref 26.0–34.0)
MCHC: 36 g/dL (ref 30.0–36.0)
RBC: 4.51 MIL/uL (ref 4.22–5.81)
WBC: 7.7 10*3/uL (ref 4.0–10.5)

## 2013-08-24 LAB — GLUCOSE, CAPILLARY: Glucose-Capillary: 114 mg/dL — ABNORMAL HIGH (ref 70–99)

## 2013-08-24 LAB — ACETAMINOPHEN LEVEL: Acetaminophen (Tylenol), Serum: <15 ug/mL (ref 10–30)

## 2013-08-24 LAB — SALICYLATE LEVEL: Salicylate Lvl: <2 mg/dL — ABNORMAL LOW (ref 2.8–20.0)

## 2013-08-24 MED ORDER — NICOTINE 21 MG/24HR TD PT24: 21.0000 mg | MEDICATED_PATCH | Freq: Every day | TRANSDERMAL | Status: DC

## 2013-08-24 MED ORDER — LORAZEPAM 1 MG PO TABS: 0.0000 mg | ORAL_TABLET | Freq: Two times a day (BID) | ORAL | Status: DC

## 2013-08-24 MED ORDER — MONTELUKAST SODIUM 10 MG PO TABS
10.0000 mg | ORAL_TABLET | Freq: Every day | ORAL | Status: DC
  Filled 2013-08-24: qty 1

## 2013-08-24 MED ORDER — LORAZEPAM 2 MG/ML IJ SOLN
2.0000 mg | Freq: Once | INTRAMUSCULAR | Status: AC
  Administered 2013-08-24: 2 mg via INTRAMUSCULAR

## 2013-08-24 MED ORDER — LORAZEPAM 1 MG PO TABS: 0.0000 mg | ORAL_TABLET | Freq: Four times a day (QID) | ORAL | Status: DC

## 2013-08-24 MED ORDER — THIAMINE HCL 100 MG/ML IJ SOLN: 100.0000 mg | Freq: Every day | INTRAMUSCULAR | Status: DC

## 2013-08-24 MED ORDER — IPRATROPIUM-ALBUTEROL 20-100 MCG/ACT IN AERS: 1.0000 | INHALATION_SPRAY | Freq: Four times a day (QID) | RESPIRATORY_TRACT | Status: DC | PRN

## 2013-08-24 MED ORDER — ESCITALOPRAM OXALATE 10 MG PO TABS: 10.0000 mg | ORAL_TABLET | Freq: Every day | ORAL | Status: DC

## 2013-08-24 MED ORDER — ONDANSETRON HCL 4 MG PO TABS: 4.0000 mg | ORAL_TABLET | Freq: Three times a day (TID) | ORAL | Status: DC | PRN

## 2013-08-24 MED ORDER — PANTOPRAZOLE SODIUM 40 MG PO TBEC
40.0000 mg | DELAYED_RELEASE_TABLET | Freq: Every day | ORAL | Status: DC
  Administered 2013-08-24: 40 mg via ORAL
  Filled 2013-08-24: qty 1

## 2013-08-24 MED ORDER — EFAVIRENZ-EMTRICITAB-TENOFOVIR 600-200-300 MG PO TABS
1.0000 | ORAL_TABLET | Freq: Every day | ORAL | Status: DC
  Filled 2013-08-24: qty 1

## 2013-08-24 MED ORDER — LORAZEPAM 2 MG/ML IJ SOLN
INTRAMUSCULAR | Status: AC
  Administered 2013-08-24: 2 mg via INTRAMUSCULAR
  Filled 2013-08-24: qty 1

## 2013-08-24 MED ORDER — IBUPROFEN 800 MG PO TABS: 800.0000 mg | ORAL_TABLET | Freq: Once | ORAL | Status: DC

## 2013-08-24 MED ORDER — ATENOLOL 25 MG PO TABS
25.0000 mg | ORAL_TABLET | Freq: Every day | ORAL | Status: DC
  Administered 2013-08-24: 25 mg via ORAL
  Filled 2013-08-24: qty 1

## 2013-08-24 MED ORDER — ALUM & MAG HYDROXIDE-SIMETH 200-200-20 MG/5ML PO SUSP: 30.0000 mL | ORAL | Status: DC | PRN

## 2013-08-24 MED ORDER — VITAMIN B-1 100 MG PO TABS
100.0000 mg | ORAL_TABLET | Freq: Every day | ORAL | Status: DC
  Administered 2013-08-24: 100 mg via ORAL
  Filled 2013-08-24: qty 1

## 2013-08-24 NOTE — ED Notes (Signed)
Per TTS, patient will be seen sometime after 0700,

## 2013-08-24 NOTE — ED Notes (Signed)
TRANSPORTATION REQUEST CALLED TO [facility]. [phone]. THEY ADVISE THAT IT WILL BE AFTER 5 BEFORE PT CAN BE TRANSPORTED

## 2013-08-24 NOTE — Progress Notes (Addendum)
ED CM picked up medication from inpatient pharmacy. Medication handed to [facility] on [address]. No further CM needs identified.

## 2013-08-24 NOTE — ED Notes (Signed)
Pt c/o taking valium 10mg  starting at 0900 at 08/23/13 (1) every other hour and then a handful at 0200 on 08/24/13.  Pt also stated that he has had a 18 pack of beer
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
## 2013-08-24 NOTE — ED Notes (Signed)
Breakfast tray ordered for patient.

## 2013-08-24 NOTE — Progress Notes (Signed)
Spoke with Patients RN Kriste Basque RE Discharge planning / Medication assistance for Mr Laughner.Before patient can be discharged to his accepting facility he needs his HIV medications to be  Filled.THis Clinical research associate spoke with Frank-Inpatient pharmacist.He verbalized once we have the prescription written for the ATRIPLA 600-200-300 MG he  Will fill the prescription.This Clinical research associate spoke  With Dr Hyacinth Meeker and the new prescription was written for the [facility].This prescription was tubed to pharmacy department in a blue topped tube at 12.25 to station 11.Contacted [place] at [facility] long and provided update on patients discharge.No further Case Manager needs.
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
## 2013-08-24 NOTE — ED Notes (Signed)
telepsych is in progress now

## 2013-08-24 NOTE — ED Notes (Addendum)
Poison control called, spoke with Patty. Informed of pt's ingestion of ETOH and "a handful of valium 10 mg tablets". Informed pt should be watched for 6 hours from time of ingestion (0200) until he can be medically cleared (0800).

## 2013-08-24 NOTE — BH Assessment (Signed)
Assessment Note  Devin [name] is an 52 y.o. male presenting to the Emergency department for medical clearance. Reportedly patient took a handful of 10 mg valium at 1-2 am. His bottle of 90 pills was filled on 08/15/13. Additionally,  patient reported drinking all day, approx. a 18 pack of beer. ED notes indicate that he was upset that his son will not let him see his grandchild. He asked for his girlfriend to bring him because he was concerned about the amount he took that something might happen to him. Denies that this was a suicide attempt. Per ED notes, patient states he is not a routine drinker.  Writer met with patient to complete a TA approx. 8am. Patient denies SI and contracts for safety. When asked about taking 10 mg of valium this am. He explains that he doesn't "really know if was that many more like 5 or 6 pills". He adamantly denies that this was a suicidal gesture/attempt stating, "I just purchased a home and I have to much to live for". Has firearms in the home and states with nurse present that if he really wants to kill himself that is how he would do it. He reports a prior history of overdosing in 1993 after breaking up with his sons mother. Patient denies depressive symptoms and anxiety. Says that his only (2) stressors include: conflict with son and son's girlfriend, financial stress, and taking care of his 60 yr old father who lives in the home with him.   Patient denies HI. No associated history.   No AVH's. No associated history.  According to ED notes patient stated that he is a routine drinker. During the TA patient denies this stating, "I rarely drink". Says that he drinks 1-2x's every 1 or 2 months. He reports drinking a 6 pack last night. Prior to last night reports in drinking in 2 months. His BAL upon arrival was 68. Patient also admits to some marijuana use. Stating he uses 1-2x's per yr and last use was over the weekend. His UDS was positive for Benzo's only.    Disposition: Pending a evaluation by an extender to determine further dis positioning.   Axis I: Mood Disorder Nos Axis II: Deferred Axis III:  Past Medical History  Diagnosis Date  . HIV infection   . Scoliosis 08/24/13  . Prolapsed disk 10/28   Axis IV: other psychosocial or environmental problems, problems related to social environment, problems with access to health care services and problems with primary support group Axis V: 31-40 impairment in reality testing  Past Medical History:  Past Medical History  Diagnosis Date  . HIV infection   . Scoliosis 08/24/13  . Prolapsed disk 10/28    History reviewed. No pertinent past surgical history.  Family History:  Family History  Problem Relation Age of Onset  . Diabetes Father     Social History:  reports that he has been smoking Cigarettes.  He has a 30 pack-year smoking history. He has never used smokeless tobacco. He reports that he drinks alcohol. He reports that he uses illicit drugs (Marijuana) about once per week.  Additional Social History:  Alcohol / Drug Use Pain Medications: SEE MAR Prescriptions: SEE MAR Over the Counter: SEE MAR History of alcohol / drug use?: Yes Substance #1 Name of Substance 1: Alcohol  1 - Age of First Use: "10 or 52 yrs old" 1 - Amount (size/oz): (2) beers or more 1 - Frequency: "1x every 1 or 2 months" 1 -  Duration: on-going  1 - Last Use / Amount: 6 pack of beer or more Substance #2 Name of Substance 2: Marijuna 2 - Age of First Use: teens 2 - Amount (size/oz): "couple of puffs" 2 - Frequency: "Rarely" 2 - Duration: on-going  2 - Last Use / Amount: "over the weekend"  CIWA: CIWA-Ar BP: 118/85 mmHg Pulse Rate: 93 Nausea and Vomiting: no nausea and no vomiting Tactile Disturbances: none Tremor: no tremor Auditory Disturbances: not present Paroxysmal Sweats: barely perceptible sweating, palms moist Visual Disturbances: not present Anxiety: mildly anxious Headache,  Fullness in Head: none present Agitation: normal activity Orientation and Clouding of Sensorium: cannot do serial additions or is uncertain about date CIWA-Ar Total: 3 COWS:    Allergies: No Known Allergies  Home Medications:  (Not in a hospital admission)  OB/GYN Status:  No LMP for male patient.  General Assessment Data Location of Assessment: [facility] ED Is this a Tele or Face-to-Face Assessment?: Face-to-Face Is this an Initial Assessment or a Re-assessment for this encounter?: Initial Assessment Living Arrangements: Other (Comment) (father lives in the home with patient) Can pt return to current living arrangement?: Yes Admission Status: Voluntary Is patient capable of signing voluntary admission?: Yes Transfer from: Acute Hospital Referral Source: Self/Family/Friend  Medical Screening Exam [facility] Walk-in ONLY) Medical Exam completed: No Reason for MSE not completed:  (Pt is in [facility] ED)  [facility] Crisis Care Plan Living Arrangements: Other (Comment) (father lives in the home with patient) Name of Psychiatrist:  (No psychiatrist ) Name of Therapist:  (No therapist)  Education Status Is patient currently in school?: No  Risk to self Suicidal Ideation: No Suicidal Intent: No Is patient at risk for suicide?: No Suicidal Plan?: No Access to Means: No What has been your use of drugs/alcohol within the last 12 months?:  (patient reports occasional alcohol and drug use) Previous Attempts/Gestures: Yes (1993 pt reports overdosing) How many times?:  ("1x long time ago over my sons mother") Other Self Harm Risks:  (none reported) Triggers for Past Attempts: Other (Comment) ("conflict with my sons mother") Intentional Self Injurious Behavior: None Recent stressful life event(s): Other (Comment);Conflict (Comment);Financial Problems (fighting w/ son verbally and via text) Persecutory voices/beliefs?: Yes Depression: No Depression Symptoms:  (patient denies symptoms) Substance abuse  history and/or treatment for substance abuse?: No Suicide prevention information given to non-admitted patients: Not applicable  Risk to Others Homicidal Ideation: No Thoughts of Harm to Others: No Current Homicidal Intent: No Current Homicidal Plan: No Access to Homicidal Means: No Identified Victim:  (n/a) History of harm to others?: No Assessment of Violence: None Noted Violent Behavior Description:  (patient is calm and cooperative) Does patient have access to weapons?: No Criminal Charges Pending?: No Does patient have a court date: No  Psychosis Hallucinations: None noted Delusions: None noted  Mental Status Report Appear/Hygiene: Disheveled Eye Contact: Good Motor Activity: Freedom of movement Speech: Logical/coherent Level of Consciousness: Alert Mood:  (appropriate) Affect: Appropriate to circumstance Anxiety Level: None Thought Processes: Coherent Judgement: Unimpaired Orientation: Person;Place;Time;Situation Obsessive Compulsive Thoughts/Behaviors: None  Cognitive Functioning Concentration: Normal Memory: Recent Intact;Remote Intact IQ: Average Insight: Fair Impulse Control: Good Appetite: Good Weight Loss:  (none reported) Weight Gain:  (approx 20 pounds in in 3 months) Sleep:  ("That comes and goes", "Right now it's ok") Total Hours of Sleep:  (6-8 hours per night) Vegetative Symptoms: None  ADLScreening [facility] Assessment Services) Patient's cognitive ability adequate to safely complete daily activities?: Yes Patient able to express need for assistance  with ADLs?: Yes Independently performs ADLs?: No  Prior Inpatient Therapy Prior Inpatient Therapy: Yes Prior Therapy Dates:  (1993) Prior Therapy Facilty/Provider(s):  ([facility] Reason for Treatment:  (overdose)  Prior Outpatient Therapy Prior Outpatient Therapy: No Prior Therapy Dates:  (n/a) Prior Therapy Facilty/Provider(s):  (n/a) Reason for Treatment:  (n/a)  ADL Screening  (condition at time of admission) Patient's cognitive ability adequate to safely complete daily activities?: Yes Is the patient deaf or have difficulty hearing?: No Does the patient have difficulty seeing, even when wearing glasses/contacts?: No Does the patient have difficulty concentrating, remembering, or making decisions?: No Patient able to express need for assistance with ADLs?: Yes Does the patient have difficulty dressing or bathing?: No Independently performs ADLs?: No Communication: Independent Dressing (OT): Independent Grooming: Independent Feeding: Independent Bathing: Independent Toileting: Independent In/Out Bed: Independent Walks in Home: Independent Does the patient have difficulty walking or climbing stairs?: No Weakness of Legs: None Weakness of Arms/Hands: None  Home Assistive Devices/Equipment Home Assistive Devices/Equipment: None    Abuse/Neglect Assessment (Assessment to be complete while patient is alone) Physical Abuse: Denies Verbal Abuse: Denies Sexual Abuse: Denies Exploitation of patient/patient's resources: Denies Self-Neglect: Denies Values / Beliefs Cultural Requests During Hospitalization: None Spiritual Requests During Hospitalization: None Consults Spiritual Care Consult Needed: No Social Work Consult Needed: No Merchant navy officer (For Healthcare) Advance Directive: Patient does not have advance directive Nutrition Screen- [facility] Adult/WL/AP Patient's home diet: Regular  Additional Information 1:1 In Past 12 Months?: No CIRT Risk: No Elopement Risk: No Does patient have medical clearance?: Yes     Disposition:  Disposition Initial Assessment Completed for this Encounter: Yes Disposition of Patient: Other dispositions (Dispo pending a eval. by an extender for possible discharge ) Other disposition(s): Other (Comment) (Pending a evaluation by an extender )  On Site Evaluation by:   Reviewed with Physician:    Melynda Ripple  [facility] 08/24/2013 9:00 AM

## 2013-08-24 NOTE — BH Assessment (Signed)
Pt accepted to [facility] by Dr. Forrestine Him pending patient has his HIV med "Atripla". Awaiting a call back from case mgt. Baxter Flattery to possible assist with this issue. Pt's nurse will leave a msg for Delice Bison on her desk and ask her to call this Clinical research associate.

## 2013-08-24 NOTE — ED Notes (Signed)
IVC paperwork given to MD  

## 2013-08-24 NOTE — ED Provider Notes (Signed)
Patient has been accepted at all the [facility] by Dr. Lynnell Jude, MD 08/24/13 [phone]

## 2013-08-24 NOTE — ED Notes (Signed)
Pt has had Eight 24oz beers, and 10-15 Valium

## 2013-08-24 NOTE — ED Provider Notes (Addendum)
CSN: [id number]     Arrival date & time 08/24/13  4098 History   First MD Initiated Contact with Patient 08/24/13 0422     Chief Complaint  Patient presents with  . Drug Overdose  . Alcohol Intoxication   (Consider location/radiation/quality/duration/timing/severity/associated sxs/prior Treatment) Patient is a 52 y.o. male presenting with Overdose and intoxication. The history is provided by the patient.  Drug Overdose This is a new problem. The current episode started 1 to 2 hours ago (starting at 9 am patient reported taking a pill and hour and then stated with nurse Casimiro Needle present that he took a handful of 10 mg valium at 1-2 am.  Bottle of 90 filled on 08/15/13). The problem occurs constantly. The problem has not changed since onset.Pertinent negatives include no headaches. Nothing aggravates the symptoms. Nothing relieves the symptoms. He has tried nothing for the symptoms. The treatment provided no relief.  Alcohol Intoxication The current episode started 12 to 24 hours ago. The problem occurs constantly. The problem has not changed since onset.Pertinent negatives include no headaches. Nothing aggravates the symptoms. Nothing relieves the symptoms. He has tried nothing for the symptoms. The treatment provided no relief.   As nurse stated patient's present for overdose was attempting to leave.  Went in immediately to see patient.  History and physical with patient's nurse Casimiro Needle present. Patient reports drinking all day, an 18 pack of beer and was taking valium one at a time all day then took a handful.  When asked how many was in the handful patient states he didn't know.  Patient demonstrates with a cupped right hand, of 10 mg valium at 1-2 am.  States that he was upset that his son will not let him see his grandchild. He asked for his girlfriend to bring him because he was concerned about the amount he took that something might happen to him. Denies that this was a suicide attempt.  Has  firearms in the home and states with nurse present that if he really wants to kill himself that is how he would do it.  Patient states he is not a routine drinker.  Denies any other medical problems.   Past Medical History  Diagnosis Date  . HIV infection   . Scoliosis 08/24/13  . Prolapsed disk 10/28   History reviewed. No pertinent past surgical history. Family History  Problem Relation Age of Onset  . Diabetes Father    History  Substance Use Topics  . Smoking status: Current Every Day Smoker -- 1.00 packs/day for 30 years    Types: Cigarettes  . Smokeless tobacco: Never Used  . Alcohol Use: Yes     Comment: beer occasional    Review of Systems  Neurological: Negative for headaches.  Psychiatric/Behavioral: Positive for dysphoric mood. Negative for hallucinations.  All other systems reviewed and are negative.    Allergies  Review of patient's allergies indicates no known allergies.  Home Medications   Current Outpatient Rx  Name  Route  Sig  Dispense  Refill  . atenolol (TENORMIN) 25 MG tablet   Oral   Take 1 tablet (25 mg total) by mouth daily.   90 tablet   3   . COMBIVENT RESPIMAT 20-100 MCG/ACT AERS respimat               . cyclobenzaprine (FLEXERIL) 5 MG tablet   Oral   Take 5 mg by mouth 3 (three) times daily as needed. Unsure of dose          .  diazepam (VALIUM) 10 MG tablet   Oral   Take 10 mg by mouth every 8 (eight) hours as needed.          Marland Kitchen efavirenz-emtricitabine-tenofovir (ATRIPLA) 600-200-300 MG per tablet   Oral   Take 1 tablet by mouth at bedtime.   30 tablet   6   . escitalopram (LEXAPRO) 10 MG tablet      TAKE 1 TABLET BY MOUTH EVERY DAY   30 tablet   0   . EXPIRED: montelukast (SINGULAIR) 10 MG tablet   Oral   Take 1 tablet (10 mg total) by mouth daily.   30 tablet   6   . omeprazole (PRILOSEC) 40 MG capsule   Oral   Take 40 mg by mouth daily.            BP 118/85  Pulse 93  Temp(Src) 97.6 F (36.4 C)  (Oral)  Resp 15  SpO2 99% Physical Exam  Constitutional: He is oriented to person, place, and time. He appears well-developed and well-nourished.  Smells of alcohol appears intoxicated  HENT:  Head: Normocephalic and atraumatic.  Mouth/Throat: Oropharynx is clear and moist.  Eyes: Conjunctivae are normal. Pupils are equal, round, and reactive to light.  Neck: Normal range of motion. Neck supple.  Cardiovascular: Normal rate, regular rhythm and intact distal pulses.   Pulmonary/Chest: Effort normal and breath sounds normal. He has no wheezes. He has no rales.  Abdominal: Soft. Bowel sounds are normal. There is no tenderness. There is no rebound and no guarding.  Musculoskeletal: Normal range of motion.  Neurological: He is alert and oriented to person, place, and time.  Skin: Skin is warm and dry. He is not diaphoretic.  Psychiatric: He is agitated and aggressive.    ED Course  Procedures (including critical care time) Labs Review Labs Reviewed  COMPREHENSIVE METABOLIC PANEL - Abnormal; Notable for the following:    Potassium 3.3 (*)    Glucose, Bld 122 (*)    Total Bilirubin 0.2 (*)    All other components within normal limits  ETHANOL - Abnormal; Notable for the following:    Alcohol, Ethyl (B) 68 (*)    All other components within normal limits  GLUCOSE, CAPILLARY - Abnormal; Notable for the following:    Glucose-Capillary 114 (*)    All other components within normal limits  CBC  URINE RAPID DRUG SCREEN (HOSP PERFORMED)  ACETAMINOPHEN LEVEL  SALICYLATE LEVEL   Imaging Review No results found.  EKG Interpretation   None       MDM  No diagnosis found. Exam and history performed with patient's nurse Casimiro Needle present.  Patient states repeatedly he wants to leave AMA. EDP explained that he cannot legally leave after admitting to a potentially lethal overdose and must be observed until 8 am to be cleared medically and then would need to speak with psychiatry as any  significant ingestion raises the concern of suicide attempt.  Per poison control patient not be medically cleared until 8 am, EDP personally confirmed this.  Aggressive and pulled own IV wanting to leave stating that if the medication has not caused him any symptoms he is fine to leave.  EDP again explained that patient is not medically safe for discharge at this time and with multiple intoxicating substances on board patient cannot legally AMA.  Patient states his girlfriend and not himself is driving. EDP explained that driving was not the issue.  EDP explained that patient cannot leave because of medication  potential effects and patient would need to be cleared from a psychiatric stand point once medically clear.  Patient's girlfriend is not his legal nor healthcare power of attorney as EDP as EDP asked and patient confirmed she is not.  EDP explained that  girlfriend cannot AMA for the patient.  EDP asked patient if he would be ok if EDP spoke with the girlfriend.  Patient gave verbal consent to speak with his girlfriend.  EDP explained to both patient and girlfriend why the patient could not leave AMA nor be discharged at this time .  Given access to medication and firearms patient is a danger to himself and EDP has signed IVC paperwork and consulted TTS.    IVC papers sent move to pod C    Darry Kelnhofer K Marvis Saefong-Rasch, MD 08/24/13 [phone]

## 2013-08-24 NOTE — BH Assessment (Signed)
Case Mgt. Will assist patient with obtaining HIV meds for his inpatient admission at [facility]. Writer called [facility] and spoke to [place]. He was notified that patient would come to the facility with HIV meds.  Patient accepted to [facility] by Dr. Forrestine Him. The call report # is [phone]. Patient will present to the [facility] C Unit. Pt to present via sheriff as he is under IVC.

## 2013-09-02 ENCOUNTER — Ambulatory Visit (HOSPITAL_COMMUNITY): Payer: Self-pay | Admitting: Psychiatry

## 2013-09-13 ENCOUNTER — Ambulatory Visit (HOSPITAL_COMMUNITY): Payer: Self-pay | Admitting: Psychiatry

## 2013-11-22 ENCOUNTER — Other Ambulatory Visit: Payer: Self-pay | Admitting: *Deleted

## 2013-11-22 DIAGNOSIS — B20 Human immunodeficiency virus [HIV] disease: Secondary | ICD-10-CM

## 2013-11-22 MED ORDER — EFAVIRENZ-EMTRICITAB-TENOFOVIR 600-200-300 MG PO TABS
1.0000 | ORAL_TABLET | Freq: Every day | ORAL | Status: DC
Start: 2013-11-22 — End: 2014-04-02

## 2013-11-22 NOTE — Telephone Encounter (Signed)
Pt needing MD appt.  Made appt for 12/01/13 w Dr Ninetta LightsHatcher.

## 2013-12-01 ENCOUNTER — Encounter: Payer: Self-pay | Admitting: Infectious Diseases

## 2013-12-01 ENCOUNTER — Ambulatory Visit (INDEPENDENT_AMBULATORY_CARE_PROVIDER_SITE_OTHER): Payer: PRIVATE HEALTH INSURANCE | Admitting: Infectious Diseases

## 2013-12-01 ENCOUNTER — Other Ambulatory Visit (HOSPITAL_COMMUNITY)
Admission: RE | Admit: 2013-12-01 | Discharge: 2013-12-01 | Disposition: A | Discharge status: Home / Self Care | Payer: PRIVATE HEALTH INSURANCE | Source: Ambulatory Visit | Attending: Infectious Diseases | Admitting: Infectious Diseases

## 2013-12-01 VITALS — Temp 98.2°F | Ht 75.0 in | Wt 203.0 lb

## 2013-12-01 DIAGNOSIS — R109 Unspecified abdominal pain: Secondary | ICD-10-CM

## 2013-12-01 DIAGNOSIS — J449 Chronic obstructive pulmonary disease, unspecified: Secondary | ICD-10-CM

## 2013-12-01 DIAGNOSIS — F17200 Nicotine dependence, unspecified, uncomplicated: Secondary | ICD-10-CM

## 2013-12-01 DIAGNOSIS — Z113 Encounter for screening for infections with a predominantly sexual mode of transmission: Secondary | ICD-10-CM

## 2013-12-01 DIAGNOSIS — B20 Human immunodeficiency virus [HIV] disease: Secondary | ICD-10-CM

## 2013-12-01 DIAGNOSIS — Z79899 Other long term (current) drug therapy: Secondary | ICD-10-CM

## 2013-12-01 DIAGNOSIS — J4489 Other specified chronic obstructive pulmonary disease: Secondary | ICD-10-CM

## 2013-12-01 DIAGNOSIS — F1010 Alcohol abuse, uncomplicated: Secondary | ICD-10-CM

## 2013-12-01 LAB — LIPID PANEL
Cholesterol: 183 mg/dL (ref 0–200)
HDL: 37 mg/dL — AB (ref >39)
LDL Cholesterol: 91 mg/dL (ref 0–99)
TRIGLYCERIDES: 276 mg/dL — AB (ref <150)
Total CHOL/HDL Ratio: 4.9 Ratio
VLDL: 55 mg/dL — ABNORMAL HIGH (ref 0–40)

## 2013-12-01 LAB — CBC
HEMATOCRIT: 44.8 % (ref 39.0–52.0)
Hemoglobin: 16.1 g/dL (ref 13.0–17.0)
MCH: 32.6 pg (ref 26.0–34.0)
MCHC: 35.9 g/dL (ref 30.0–36.0)
MCV: 90.7 fL (ref 78.0–100.0)
Platelets: 231 10*3/uL (ref 150–400)
RBC: 4.94 MIL/uL (ref 4.22–5.81)
RDW: 13.2 % (ref 11.5–15.5)
WBC: 7.6 10*3/uL (ref 4.0–10.5)

## 2013-12-01 LAB — COMPREHENSIVE METABOLIC PANEL
ALT: 18 U/L (ref 0–53)
AST: 20 U/L (ref 0–37)
Albumin: 4.5 g/dL (ref 3.5–5.2)
Alkaline Phosphatase: 107 U/L (ref 39–117)
BUN: 8 mg/dL (ref 6–23)
CALCIUM: 9.4 mg/dL (ref 8.4–10.5)
CO2: 24 mEq/L (ref 19–32)
CREATININE: 0.9 mg/dL (ref 0.50–1.35)
Chloride: 104 mEq/L (ref 96–112)
Glucose, Bld: 96 mg/dL (ref 70–99)
Potassium: 4.1 mEq/L (ref 3.5–5.3)
Sodium: 138 mEq/L (ref 135–145)
Total Bilirubin: 0.4 mg/dL (ref 0.2–1.2)
Total Protein: 7.4 g/dL (ref 6.0–8.3)

## 2013-12-01 NOTE — Assessment & Plan Note (Signed)
Appears to be stable, again- encouraged him to stop smoking.

## 2013-12-01 NOTE — Assessment & Plan Note (Signed)
Encouraged to quit. 

## 2013-12-01 NOTE — Progress Notes (Signed)
   Subjective:    Patient ID: Devin [name], male    DOB: 1961/09/12, 53 y.o.   MRN: [id number]  HPI 53 yo M with HIV+ dx 1995, and hx of COPD. Has been on atripla for ~ 5 years (prev took CBV/EFV). His most recent hep B Sab was (+ 04-02-13). Still taking care of his father with health issues.  Continues to have back pain, still thinking of having surgery. Afraid that it will paralyze him.  Has missed "a few" doses of meds.   Still smoking, drank "a little too much" around christmas/new years. Had melena. Has quit drinking hard liquour, still drinks beer (> 6 pack). Had endoscopy in his 5620s.   HIV 1 RNA Quant (copies/mL)  Date Value  04/12/2013 <20   10/05/2012 <20   04/06/2012 <20      CD4 T Cell Abs (cmm)  Date Value  04/12/2013 750   10/05/2012 550   04/07/2012 710     Review of Systems  Constitutional: Negative for appetite change and unexpected weight change.  Respiratory: Negative for cough and shortness of breath.   Cardiovascular: Negative for chest pain.  Gastrointestinal: Negative for diarrhea and constipation.  Genitourinary: Negative for difficulty urinating.  Neurological: Negative for light-headedness.       Objective:   Physical Exam  Constitutional: He appears well-developed and well-nourished.  HENT:  Mouth/Throat: No oropharyngeal exudate.  Eyes: EOM are normal. Pupils are equal, round, and reactive to light.  Neck: Neck supple.  Cardiovascular: Normal rate, regular rhythm and normal heart sounds.   Pulmonary/Chest: Effort normal and breath sounds normal.  Abdominal: Soft. Bowel sounds are normal. There is no tenderness. There is no rebound.  Lymphadenopathy:    He has no cervical adenopathy.      Assessment & Plan:

## 2013-12-01 NOTE — Assessment & Plan Note (Signed)
Will have him seen by GI. He needs endoscopy and to stop drinking.

## 2013-12-01 NOTE — Assessment & Plan Note (Signed)
Encouraged him to decrease/stop his ETOH

## 2013-12-01 NOTE — Assessment & Plan Note (Addendum)
Will recheck his labs. Offer condoms. His Vax are up to date and his Hep B status is immune. See him back in 6 months.

## 2013-12-02 LAB — RPR

## 2013-12-02 LAB — T-HELPER CELL (CD4) - (RCID CLINIC ONLY)
CD4 T CELL HELPER: 39 % (ref 33–55)
CD4 T Cell Abs: 870 /uL (ref 400–2700)

## 2013-12-03 LAB — HIV-1 RNA QUANT-NO REFLEX-BLD
HIV 1 RNA Quant: <20 copies/mL (ref <20)
HIV-1 RNA Quant, Log: <1.3 {Log} (ref <1.30)

## 2013-12-22 NOTE — Addendum Note (Signed)
Addended by: Andree CossHOWELL, Jamee Keach M on: 12/22/2013 03:28 PM   Modules accepted: Orders

## 2014-01-11 ENCOUNTER — Encounter (INDEPENDENT_AMBULATORY_CARE_PROVIDER_SITE_OTHER): Payer: Self-pay

## 2014-01-11 ENCOUNTER — Encounter: Payer: Self-pay | Admitting: Gastroenterology

## 2014-01-11 ENCOUNTER — Ambulatory Visit (INDEPENDENT_AMBULATORY_CARE_PROVIDER_SITE_OTHER): Payer: PRIVATE HEALTH INSURANCE | Admitting: Gastroenterology

## 2014-01-11 VITALS — BP 130/86 | HR 91 | Temp 98.4°F | Wt 202.2 lb

## 2014-01-11 DIAGNOSIS — Z1211 Encounter for screening for malignant neoplasm of colon: Secondary | ICD-10-CM | POA: Insufficient documentation

## 2014-01-11 DIAGNOSIS — R1310 Dysphagia, unspecified: Secondary | ICD-10-CM

## 2014-01-11 DIAGNOSIS — R109 Unspecified abdominal pain: Secondary | ICD-10-CM

## 2014-01-11 MED ORDER — PEG 3350-KCL-NA BICARB-NACL 420 G PO SOLR: 4000.0000 mL | ORAL | Status: DC

## 2014-01-11 NOTE — Patient Instructions (Signed)
We have scheduled you for a colonoscopy, upper endoscopy and dilation with Dr. Rourk in the near future.  Further recommendations to follow!   

## 2014-01-11 NOTE — Progress Notes (Signed)
Primary Care Physician:  Milana Obey, MD Primary Gastroenterologist:  Dr. Jena Gauss  Chief Complaint  Patient presents with  . Abdominal Pain  . Dysphagia    can not swallow pills of food at time    HPI:   Devin [name] presents today at the request of Infectious Disease due to abdominal pain and dysphagia. History of HIV. Notes intermittent black, tarry stool. Notes lower abdominal discomfort and epigastric discomfort. Aggravated by eating. Feels constant. Present for about 3-4 weeks. Occasional N/V, pill dysphagia with meds. States throws up "spit". Solid food dysphagia for years, remote EGD years ago in [place] with dilation. Alternating constipation and diarrhea, comes and goes. No hematochezia. No weight loss. Has to pick what he can eat otherwise will have issues with dysphagia, dyspepsia. Early satiety. Occasional Ibuprofen but "not much". No aspirin powders. Prilosec 40 mg daily without much improvement. No prior colonoscopy.   Past Medical History  Diagnosis Date  . HIV infection   . Scoliosis 08/24/13  . Prolapsed disk 10/28  . Hypertension   . Chronic back pain   . COPD (chronic obstructive pulmonary disease)     Past Surgical History  Procedure Laterality Date  . Knee surgery      right    Current Outpatient Prescriptions  Medication Sig Dispense Refill  . amitriptyline (ELAVIL) 50 MG tablet Take 50 mg by mouth at bedtime.       . cyclobenzaprine (FLEXERIL) 5 MG tablet Take 5 mg by mouth 3 (three) times daily as needed. Unsure of dose       . diazepam (VALIUM) 10 MG tablet Take 10 mg by mouth every 8 (eight) hours as needed.       Marland Kitchen efavirenz-emtricitabine-tenofovir (ATRIPLA) 600-200-300 MG per tablet Take 1 tablet by mouth at bedtime.  30 tablet  3  . HYDROcodone-acetaminophen (NORCO) 10-325 MG per tablet       . omeprazole (PRILOSEC) 40 MG capsule Take 40 mg by mouth daily.         No current facility-administered medications for this visit.     Allergies as of 01/11/2014  . (No Known Allergies)    Family History  Problem Relation Age of Onset  . Diabetes Father   . Colon cancer Neg Hx     History   Social History  . Marital Status: Divorced    Spouse Name: N/A    Number of Children: N/A  . Years of Education: N/A   Occupational History  . Not on file.   Social History Main Topics  . Smoking status: Current Every Day Smoker -- 1.00 packs/day for 30 years    Types: Cigarettes  . Smokeless tobacco: Never Used  . Alcohol Use: Yes     Comment: beer occasional, rare  . Drug Use: 1.00 per week    Special: Marijuana     Comment: occasional  . Sexual Activity: Yes    Partners: Female     Comment: pt. given condoms   Other Topics Concern  . Not on file   Social History Narrative  . No narrative on file    Review of Systems: As mentioned in HPI.   Physical Exam: BP 130/86  Pulse 91  Temp(Src) 98.4 F (36.9 C) (Oral)  Wt 202 lb 3.2 oz (91.717 kg) General:   Alert and oriented. Pleasant and cooperative. Well-nourished and well-developed.  Head:  Normocephalic and atraumatic. Eyes:  Without icterus, sclera clear and conjunctiva pink.  Ears:  Normal auditory acuity. Nose:  No deformity, discharge,  or lesions. Mouth:  No deformity or lesions, oral mucosa pink.  Neck:  Supple, without mass or thyromegaly. Lungs:  Coarse bilaterally Heart:  S1, S2 present without murmurs appreciated.  Abdomen:  +BS, soft, TTP epigastric region and RUQ, non-distended. No HSM noted. No guarding or rebound. No masses appreciated.  Rectal:  Deferred  Msk:  Symmetrical without gross deformities. Normal posture. Extremities:  Without clubbing or edema. Neurologic:  Alert and  oriented x4;  grossly normal neurologically. Skin:  Intact without significant lesions or rashes. Cervical Nodes:  No significant cervical adenopathy. Psych:  Alert and cooperative. Normal mood and affect.  Lab Results  Component Value Date   WBC  7.6 12/01/2013   HGB 16.1 12/01/2013   HCT 44.8 12/01/2013   MCV 90.7 12/01/2013   PLT 231 12/01/2013   Lab Results  Component Value Date   ALT 18 12/01/2013   AST 20 12/01/2013   ALKPHOS 107 12/01/2013   BILITOT 0.4 12/01/2013   Lab Results  Component Value Date   CREATININE 0.90 12/01/2013   BUN 8 12/01/2013   NA 138 12/01/2013   K 4.1 12/01/2013   CL 104 12/01/2013   CO2 24 12/01/2013

## 2014-01-13 ENCOUNTER — Encounter (HOSPITAL_COMMUNITY): Payer: Self-pay

## 2014-01-13 NOTE — Patient Instructions (Signed)
Devin [name]  01/13/2014   Your procedure is scheduled on:  01/20/14  Report to [facility] at 0745 AM.  Call this number if you have problems the morning of surgery: [phone]   Remember:   Do not eat food or drink liquids after midnight.   Take these medicines the morning of surgery with A SIP OF WATER: pain pill if needed, flexeril, valium, prilosec   Do not wear jewelry, make-up or nail polish.  Do not wear lotions, powders, or perfumes. You may wear deodorant.  Do not shave 48 hours prior to surgery. Men may shave face and neck.  Do not bring valuables to the hospital.  [facility] is not responsible                  for any belongings or valuables.               Contacts, dentures or bridgework may not be worn into surgery.  Leave suitcase in the car. After surgery it may be brought to your room.  For patients admitted to the hospital, discharge time is determined by your                treatment team.               Patients discharged the day of surgery will not be allowed to drive  home.  Name and phone number of your driver: family  Special Instructions: N/A   Please read over the following fact sheets that you were given: Anesthesia Post-op Instructions and Care and Recovery After Surgery   PATIENT INSTRUCTIONS POST-ANESTHESIA  IMMEDIATELY FOLLOWING SURGERY:  Do not drive or operate machinery for the first twenty four hours after surgery.  Do not make any important decisions for twenty four hours after surgery or while taking narcotic pain medications or sedatives.  If you develop intractable nausea and vomiting or a severe headache please notify your doctor immediately.  FOLLOW-UP:  Please make an appointment with your surgeon as instructed. You do not need to follow up with anesthesia unless specifically instructed to do so.  WOUND CARE INSTRUCTIONS (if applicable):  Keep a dry clean dressing on the anesthesia/puncture wound site if there is drainage.  Once the wound  has quit draining you may leave it open to air.  Generally you should leave the bandage intact for twenty four hours unless there is drainage.  If the epidural site drains for more than 36-48 hours please call the anesthesia department.  QUESTIONS?:  Please feel free to call your physician or the hospital operator if you have any questions, and they will be happy to assist you.      Esophageal Dilatation The esophagus is the long, narrow tube which carries food and liquid from the mouth to the stomach. Esophageal dilatation is the technique used to stretch a blocked or narrowed portion of the esophagus. This procedure is used when a part of the esophagus has become so narrow that it becomes difficult, painful or even impossible to swallow. This is generally an uncomplicated form of treatment. When this is not successful, chest surgery may be required. This is a much more extensive form of treatment with a longer recovery time. CAUSES  Some of the more common causes of blockage or strictures of the esophagus are:  Narrowing from longstanding inflammation (soreness and redness) of the lower esophagus. This comes from the constant exposure of the lower esophagus to the acid which bubbles up  from the stomach. Over time this causes scarring and narrowing of the lower esophagus.  Hiatal hernia in which a small part of the stomach bulges (herniates) up through the diaphragm. This can cause a gradual narrowing of the end of the esophagus.  Schatzki's Ring is a narrow ring of benign (non-cancerous) fibrous tissue which constricts the lower esophagus. The reason for this is not known.  Scleroderma is a connective tissue disorder that affects the esophagus and makes swallowing difficult.  Achalasia is an absence of nerves to the lower esophagus and to the esophageal sphincter. This is the circular muscle between the stomach and esophagus that relaxes to allow food into the stomach. After swallowing, it  contracts to keep food in the stomach. This absence of nerves may be congenital (present since birth). This can cause irregular spasms of the lower esophageal muscle. This spasm does not open up to allow food and fluid through. The result is a persistent blockage with subsequent slow trickling of the esophageal contents into the stomach.  Strictures may develop from swallowing materials which damage the esophagus. Some examples are strong acids or alkalis such as lye.  Growths such as benign (non-cancerous) and malignant (cancerous) tumors can block the esophagus.  Heredity (present since birth) causes. DIAGNOSIS  Your caregiver often suspects this problem by taking a medical history. They will also do a physical exam. They can then prove their suspicions using X-rays and endoscopy. Endoscopy is an exam in which a tube like a small flexible telescope is used to look at your esophagus.  TREATMENT There are different stretching (dilating) techniques which can be used. Simple bougie dilatation may be done in the office. This usually takes only a couple minutes. A numbing (anesthetic) spray of the throat is used. Endoscopy, when done, is done in an endoscopy suite, under mild sedation. When fluoroscopy is used, the procedure is performed in X-ray. Other techniques require a little longer time. Recovery is usually quick. There is no waiting time to begin eating and drinking to test success of the treatment. Following are some of the methods used. Narrowing of the esophagus is treated by making it bigger. Commonly this is a mechanical problem which can be treated with stretching. This can be done in different ways. Your caregiver will discuss these with you. Some of the means used are:  A series of graduated (increasing thickness) flexible dilators can be used. These are weighted tubes passed through the esophagus into the stomach. The tubes used become progressively larger until the desired stretched size  is reached. Graduated dilators are a simple and quick way of opening the esophagus. No visualization is required.  Another method is the use of endoscopy to place a flexible wire across the stricture. The endoscope is removed and the wire left in place. A dilator with a hole through it from end to end is guided down the esophagus and across the stricture. One or more of these dilators are passed over the wire. At the end of the exam, the wire is removed. This type of treatment may be performed in the X-ray department under fluoroscopy. An advantage of this procedure is the examiner is visualizing the end opening in the esophagus.  Stretching of the esophagus may be done using balloons. Deflated balloons are placed through the endoscope and across the stricture. This type of balloon dilatation is often done at the time of endoscopy or fluoroscopy. Flexible endoscopy allows the examiner to directly view the stricture. A balloon is  inserted in the deflated form into the area of narrowing. It is then inflated with air to a certain pressure that is pre-set for a given circumference. When inflated, it becomes sausage shaped, stretched, and makes the stricture larger.  Achalasia requires a longer larger balloon-type dilator. This is frequently done under X-ray control. In this situation, the spastic muscle fibers in the lower esophagus are stretched. All of the above procedures make the passage of food and water into the stomach easier. They also make it easier for stomach contents to reflux back into the esophagus. Special medications may be used following the procedure to help prevent further stricturing. Proton-pump inhibitor medications are good at decreasing the amount of acid in the stomach juice. When stomach juice refluxes into the esophagus, the juice is no longer as acidic and is less likely to burn or scar the esophagus. RISKS AND COMPLICATIONS Esophageal dilatation is usually performed effectively and  without problems. Some complications that can occur are:  A small amount of bleeding almost always happens where the stretching takes place. If this is too excessive it may require more aggressive treatment.  An uncommon complication is perforation (making a hole) of the esophagus. The esophagus is thin. It is easy to make a hole in it. If this happens, an operation may be necessary to repair this.  A small, undetected perforation could lead to an infection in the chest. This can be very serious. HOME CARE INSTRUCTIONS   If you received sedation for your procedure, do not drive, make important decisions, or perform any activities requiring your full coordination. Do not drink alcohol, take sedatives, or use any mind altering chemicals unless instructed by your caregiver.  You may use throat lozenges or warm salt water gargles if you have throat discomfort  You can begin eating and drinking normally on return home unless instructed otherwise. Do not purposely try to force large chunks of food down to test the benefits of your procedure.  Mild discomfort can be eased with sips of ice water.  Medications for discomfort may or may not be needed. SEEK IMMEDIATE MEDICAL CARE IF:   You begin vomiting up blood.  You develop black tarry stools  You develop chills or an unexplained temperature of over 101 F (38.3 C)  You develop chest or abdominal pain.  You develop shortness of breath or feel lightheaded or faint.  Your swallowing is becoming more painful, difficult, or you are unable to swallow. MAKE SURE YOU:   Understand these instructions.  Will watch your condition.  Will get help right away if you are not doing well or get worse. Document Released: 12/05/2005 Document Revised: 01/06/2012 Document Reviewed: 01/22/2006 [facility] Patient Information 2014 [place], [place]. Esophagogastroduodenoscopy Esophagogastroduodenoscopy (EGD) is a procedure to examine the lining of the  esophagus, stomach, and first part of the small intestine (duodenum). A long, flexible, lighted tube with a camera attached (endoscope) is inserted down the throat to view these organs. This procedure is done to detect problems or abnormalities, such as inflammation, bleeding, ulcers, or growths, in order to treat them. The procedure lasts about 5 20 minutes. It is usually an outpatient procedure, but it may need to be performed in emergency cases in the hospital. LET YOUR CAREGIVER KNOW ABOUT:   Allergies to food or medicine.  All medicines you are taking, including vitamins, herbs, eyedrops, and over-the-counter medicines and creams.  Use of steroids (by mouth or creams).  Previous problems you or members of your family have  had with the use of anesthetics.  Any blood disorders you have.  Previous surgeries you have had.  Other health problems you have.  Possibility of pregnancy, if this applies. RISKS AND COMPLICATIONS  Generally, EGD is a safe procedure. However, as with any procedure, complications can occur. Possible complications include:  Infection.  Bleeding.  Tearing (perforation) of the esophagus, stomach, or duodenum.  Difficulty breathing or not being able to breath.  Excessive sweating.  Spasms of the larynx.  Slowed heartbeat.  Low blood pressure. BEFORE THE PROCEDURE  Do not eat or drink anything for 6 8 hours before the procedure or as directed by your caregiver.  Ask your caregiver about changing or stopping your regular medicines.  If you wear dentures, be prepared to remove them before the procedure.  Arrange for someone to drive you home after the procedure. PROCEDURE   A vein will be accessed to give medicines and fluids. A medicine to relax you (sedative) and a pain reliever will be given through that access into the vein.  A numbing medicine (local anesthetic) may be sprayed on your throat for comfort and to stop you from gagging or  coughing.  A mouth guard may be placed in your mouth to protect your teeth and to keep you from biting on the endoscope.  You will be asked to lie on your left side.  The endoscope is inserted down your throat and into the esophagus, stomach, and duodenum.  Air is put through the endoscope to allow your caregiver to view the lining of your esophagus clearly.  The esophagus, stomach, and duodenum is then examined. During the exam, your caregiver may:  Remove tissue to be examined under a microscope (biopsy) for inflammation, infection, or other medical problems.  Remove growths.  Remove objects (foreign bodies) that are stuck.  Treat any bleeding with medicines or other devices that stop tissues from bleeding (hot cauters, clipping devices).  Widen (dilate) or stretch narrowed areas of the esophagus and stomach.  The endoscope will then be withdrawn. AFTER THE PROCEDURE  You will be taken to a recovery area to be monitored. You will be able to go home once you are stable and alert.  Do not eat or drink anything until the local anesthetic and numbing medicines have worn off. You may choke.  It is normal to feel bloated, have pain with swallowing, or have a sore throat for a short time. This will wear off.  Your caregiver should be able to discuss his or her findings with you. It will take longer to discuss the test results if any biopsies were taken. Document Released: 02/14/2005 Document Revised: 09/30/2012 Document Reviewed: 09/16/2012 [facility] Patient Information 2014 [place], [place]. Colonoscopy A colonoscopy is an exam to look at the entire large intestine (colon). This exam can help find problems such as tumors, polyps, inflammation, and areas of bleeding. The exam takes about 1 hour.  LET [facility] CARE PROVIDER KNOW ABOUT:   Any allergies you have.  All medicines you are taking, including vitamins, herbs, eye drops, creams, and over-the-counter medicines.  Previous  problems you or members of your family have had with the use of anesthetics.  Any blood disorders you have.  Previous surgeries you have had.  Medical conditions you have. RISKS AND COMPLICATIONS  Generally, this is a safe procedure. However, as with any procedure, complications can occur. Possible complications include:  Bleeding.  Tearing or rupture of the colon wall.  Reaction to medicines given during  the exam.  Infection (rare). BEFORE THE PROCEDURE   Ask your health care provider about changing or stopping your regular medicines.  You may be prescribed an oral bowel prep. This involves drinking a large amount of medicated liquid, starting the day before your procedure. The liquid will cause you to have multiple loose stools until your stool is almost clear or light green. This cleans out your colon in preparation for the procedure.  Do not eat or drink anything else once you have started the bowel prep, unless your health care provider tells you it is safe to do so.  Arrange for someone to drive you home after the procedure. PROCEDURE   You will be given medicine to help you relax (sedative).  You will lie on your side with your knees bent.  A long, flexible tube with a light and camera on the end (colonoscope) will be inserted through the rectum and into the colon. The camera sends video back to a computer screen as it moves through the colon. The colonoscope also releases carbon dioxide gas to inflate the colon. This helps your health care provider see the area better.  During the exam, your health care provider may take a small tissue sample (biopsy) to be examined under a microscope if any abnormalities are found.  The exam is finished when the entire colon has been viewed. AFTER THE PROCEDURE   Do not drive for 24 hours after the exam.  You may have a small amount of blood in your stool.  You may pass moderate amounts of gas and have mild abdominal cramping or  bloating. This is caused by the gas used to inflate your colon during the exam.  Ask when your test results will be ready and how you will get your results. Make sure you get your test results. Document Released: 10/11/2000 Document Revised: 08/04/2013 Document Reviewed: 06/21/2013 [facility] Patient Information 2014 [place], [place].

## 2014-01-14 ENCOUNTER — Encounter (HOSPITAL_COMMUNITY)
Admission: RE | Admit: 2014-01-14 | Discharge: 2014-01-14 | Disposition: A | Discharge status: Home / Self Care | Payer: 59 | Source: Ambulatory Visit | Attending: Internal Medicine | Admitting: Internal Medicine

## 2014-01-14 NOTE — Assessment & Plan Note (Signed)
No rectal bleeding or significant change in bowel habits. Initial screening needed at time of EGD. Risks and benefits discussed in detail. Propofol at time of EGD/ED/TCS.

## 2014-01-14 NOTE — Assessment & Plan Note (Signed)
Dyspepsia: EGD as planned. Vague lower abdominal discomfort without rectal bleeding or significant change in bowel habits. EGD/TCS as planned.

## 2014-01-14 NOTE — Assessment & Plan Note (Addendum)
53 year old male with solid food and pill dysphagia noted as chronic, [place]. Occasional N/V, associated dyspepsia and early satiety. Rare Ibuprofen, Prilosec daily without improvement. Possible melena reported intermittently, but last CBC normal. Query gastritis, PUD, esophagitis, unable to exclude possible biliary component as culprit of abdominal pain as gallbladder remains in situ. Needs EGD with dilation in near future.   Proceed with EGD and dilation with Dr. Jena Gaussourk in near future: the risks, benefits, and alternatives have been discussed with the patient in detail. The patient states understanding and desires to proceed. PROPOFOL due to reported high tolerance for pain medication, polypharmacy, marijuana use US of abdomen if concern for biliary etiology

## 2014-01-17 NOTE — Progress Notes (Signed)
cc'd to pcp 

## 2014-01-18 ENCOUNTER — Telehealth: Payer: Self-pay | Admitting: *Deleted

## 2014-01-18 NOTE — Patient Instructions (Signed)
Your procedure is scheduled on:  02/03/14  Report to [facility] at 09:10 AM  Call this number if you have problems the morning of surgery: [phone]   Remember:   Do not eat food or drink liquids after midnight.   Take these medicines the morning of surgery with A SIP OF WATER: Omeprazole. You may take your Flexeril, Valium and Hydrocodone if needed. Use your Albuterol and Combivent inhaler also.   Do not wear jewelry, make-up or nail polish.  Do not wear lotions, powders, or perfumes.   Do not shave 48 hours prior to surgery. Men may shave face and neck.  Do not bring valuables to the hospital.  [facility] is not responsible for any belongings or valuables.               Contacts, dentures or bridgework may not be worn into surgery.  Leave suitcase in the car. After surgery it may be brought to your room.  For patients admitted to the hospital, discharge time is determined by your treatment team.               Patients discharged the day of surgery will not be allowed to drive home.    Special Instructions: Start your bowel prep as directed by your GI doctor.   Please read over the following fact sheets that you were given: Anesthesia Post-op Instructions and Care and Recovery After Surgery     Esophagogastroduodenoscopy Esophagogastroduodenoscopy (EGD) is a procedure to examine the lining of the esophagus, stomach, and first part of the small intestine (duodenum). A long, flexible, lighted tube with a camera attached (endoscope) is inserted down the throat to view these organs. This procedure is done to detect problems or abnormalities, such as inflammation, bleeding, ulcers, or growths, in order to treat them. The procedure lasts about 5 20 minutes. It is usually an outpatient procedure, but it may need to be performed in emergency cases in the hospital. LET YOUR CAREGIVER KNOW ABOUT:   Allergies to food or medicine.  All medicines you are taking, including vitamins, herbs,  eyedrops, and over-the-counter medicines and creams.  Use of steroids (by mouth or creams).  Previous problems you or members of your family have had with the use of anesthetics.  Any blood disorders you have.  Previous surgeries you have had.  Other health problems you have.  Possibility of pregnancy, if this applies. RISKS AND COMPLICATIONS  Generally, EGD is a safe procedure. However, as with any procedure, complications can occur. Possible complications include:  Infection.  Bleeding.  Tearing (perforation) of the esophagus, stomach, or duodenum.  Difficulty breathing or not being able to breath.  Excessive sweating.  Spasms of the larynx.  Slowed heartbeat.  Low blood pressure. BEFORE THE PROCEDURE  Do not eat or drink anything for 6 8 hours before the procedure or as directed by your caregiver.  Ask your caregiver about changing or stopping your regular medicines.  If you wear dentures, be prepared to remove them before the procedure.  Arrange for someone to drive you home after the procedure. PROCEDURE   A vein will be accessed to give medicines and fluids. A medicine to relax you (sedative) and a pain reliever will be given through that access into the vein.  A numbing medicine (local anesthetic) may be sprayed on your throat for comfort and to stop you from gagging or coughing.  A mouth guard may be placed in your mouth to protect your teeth and to  keep you from biting on the endoscope.  You will be asked to lie on your left side.  The endoscope is inserted down your throat and into the esophagus, stomach, and duodenum.  Air is put through the endoscope to allow your caregiver to view the lining of your esophagus clearly.  The esophagus, stomach, and duodenum is then examined. During the exam, your caregiver may:  Remove tissue to be examined under a microscope (biopsy) for inflammation, infection, or other medical problems.  Remove  growths.  Remove objects (foreign bodies) that are stuck.  Treat any bleeding with medicines or other devices that stop tissues from bleeding (hot cauters, clipping devices).  Widen (dilate) or stretch narrowed areas of the esophagus and stomach.  The endoscope will then be withdrawn. AFTER THE PROCEDURE  You will be taken to a recovery area to be monitored. You will be able to go home once you are stable and alert.  Do not eat or drink anything until the local anesthetic and numbing medicines have worn off. You may choke.  It is normal to feel bloated, have pain with swallowing, or have a sore throat for a short time. This will wear off.  Your caregiver should be able to discuss his or her findings with you. It will take longer to discuss the test results if any biopsies were taken. Document Released: 02/14/2005 Document Revised: 09/30/2012 Document Reviewed: 09/16/2012 [facility] Patient Information 2014 [place], [place].    Esophageal Dilatation The esophagus is the long, narrow tube which carries food and liquid from the mouth to the stomach. Esophageal dilatation is the technique used to stretch a blocked or narrowed portion of the esophagus. This procedure is used when a part of the esophagus has become so narrow that it becomes difficult, painful or even impossible to swallow. This is generally an uncomplicated form of treatment. When this is not successful, chest surgery may be required. This is a much more extensive form of treatment with a longer recovery time. CAUSES  Some of the more common causes of blockage or strictures of the esophagus are:  Narrowing from longstanding inflammation (soreness and redness) of the lower esophagus. This comes from the constant exposure of the lower esophagus to the acid which bubbles up from the stomach. Over time this causes scarring and narrowing of the lower esophagus.  Hiatal hernia in which a small part of the stomach bulges (herniates) up  through the diaphragm. This can cause a gradual narrowing of the end of the esophagus.  Schatzki's Ring is a narrow ring of benign (non-cancerous) fibrous tissue which constricts the lower esophagus. The reason for this is not known.  Scleroderma is a connective tissue disorder that affects the esophagus and makes swallowing difficult.  Achalasia is an absence of nerves to the lower esophagus and to the esophageal sphincter. This is the circular muscle between the stomach and esophagus that relaxes to allow food into the stomach. After swallowing, it contracts to keep food in the stomach. This absence of nerves may be congenital (present since birth). This can cause irregular spasms of the lower esophageal muscle. This spasm does not open up to allow food and fluid through. The result is a persistent blockage with subsequent slow trickling of the esophageal contents into the stomach.  Strictures may develop from swallowing materials which damage the esophagus. Some examples are strong acids or alkalis such as lye.  Growths such as benign (non-cancerous) and malignant (cancerous) tumors can block the esophagus.  Heredity (  present since birth) causes. DIAGNOSIS  Your caregiver often suspects this problem by taking a medical history. They will also do a physical exam. They can then prove their suspicions using X-rays and endoscopy. Endoscopy is an exam in which a tube like a small flexible telescope is used to look at your esophagus.  TREATMENT There are different stretching (dilating) techniques which can be used. Simple bougie dilatation may be done in the office. This usually takes only a couple minutes. A numbing (anesthetic) spray of the throat is used. Endoscopy, when done, is done in an endoscopy suite, under mild sedation. When fluoroscopy is used, the procedure is performed in X-ray. Other techniques require a little longer time. Recovery is usually quick. There is no waiting time to begin  eating and drinking to test success of the treatment. Following are some of the methods used. Narrowing of the esophagus is treated by making it bigger. Commonly this is a mechanical problem which can be treated with stretching. This can be done in different ways. Your caregiver will discuss these with you. Some of the means used are:  A series of graduated (increasing thickness) flexible dilators can be used. These are weighted tubes passed through the esophagus into the stomach. The tubes used become progressively larger until the desired stretched size is reached. Graduated dilators are a simple and quick way of opening the esophagus. No visualization is required.  Another method is the use of endoscopy to place a flexible wire across the stricture. The endoscope is removed and the wire left in place. A dilator with a hole through it from end to end is guided down the esophagus and across the stricture. One or more of these dilators are passed over the wire. At the end of the exam, the wire is removed. This type of treatment may be performed in the X-ray department under fluoroscopy. An advantage of this procedure is the examiner is visualizing the end opening in the esophagus.  Stretching of the esophagus may be done using balloons. Deflated balloons are placed through the endoscope and across the stricture. This type of balloon dilatation is often done at the time of endoscopy or fluoroscopy. Flexible endoscopy allows the examiner to directly view the stricture. A balloon is inserted in the deflated form into the area of narrowing. It is then inflated with air to a certain pressure that is pre-set for a given circumference. When inflated, it becomes sausage shaped, stretched, and makes the stricture larger.  Achalasia requires a longer larger balloon-type dilator. This is frequently done under X-ray control. In this situation, the spastic muscle fibers in the lower esophagus are stretched. All of the  above procedures make the passage of food and water into the stomach easier. They also make it easier for stomach contents to reflux back into the esophagus. Special medications may be used following the procedure to help prevent further stricturing. Proton-pump inhibitor medications are good at decreasing the amount of acid in the stomach juice. When stomach juice refluxes into the esophagus, the juice is no longer as acidic and is less likely to burn or scar the esophagus. RISKS AND COMPLICATIONS Esophageal dilatation is usually performed effectively and without problems. Some complications that can occur are:  A small amount of bleeding almost always happens where the stretching takes place. If this is too excessive it may require more aggressive treatment.  An uncommon complication is perforation (making a hole) of the esophagus. The esophagus is thin. It is easy to  make a hole in it. If this happens, an operation may be necessary to repair this.  A small, undetected perforation could lead to an infection in the chest. This can be very serious. HOME CARE INSTRUCTIONS   If you received sedation for your procedure, do not drive, make important decisions, or perform any activities requiring your full coordination. Do not drink alcohol, take sedatives, or use any mind altering chemicals unless instructed by your caregiver.  You may use throat lozenges or warm salt water gargles if you have throat discomfort  You can begin eating and drinking normally on return home unless instructed otherwise. Do not purposely try to force large chunks of food down to test the benefits of your procedure.  Mild discomfort can be eased with sips of ice water.  Medications for discomfort may or may not be needed. SEEK IMMEDIATE MEDICAL CARE IF:   You begin vomiting up blood.  You develop black tarry stools  You develop chills or an unexplained temperature of over 101 F (38.3 C)  You develop chest or  abdominal pain.  You develop shortness of breath or feel lightheaded or faint.  Your swallowing is becoming more painful, difficult, or you are unable to swallow. MAKE SURE YOU:   Understand these instructions.  Will watch your condition.  Will get help right away if you are not doing well or get worse. Document Released: 12/05/2005 Document Revised: 01/06/2012 Document Reviewed: 01/22/2006 [facility] Patient Information 2014 [place].    Colonoscopy A colonoscopy is an exam to look at the entire large intestine (colon). This exam can help find problems such as tumors, polyps, inflammation, and areas of bleeding. The exam takes about 1 hour.  LET [facility] CARE PROVIDER KNOW ABOUT:   Any allergies you have.  All medicines you are taking, including vitamins, herbs, eye drops, creams, and over-the-counter medicines.  Previous problems you or members of your family have had with the use of anesthetics.  Any blood disorders you have.  Previous surgeries you have had.  Medical conditions you have. RISKS AND COMPLICATIONS  Generally, this is a safe procedure. However, as with any procedure, complications can occur. Possible complications include:  Bleeding.  Tearing or rupture of the colon wall.  Reaction to medicines given during the exam.  Infection (rare). BEFORE THE PROCEDURE   Ask your health care provider about changing or stopping your regular medicines.  You may be prescribed an oral bowel prep. This involves drinking a large amount of medicated liquid, starting the day before your procedure. The liquid will cause you to have multiple loose stools until your stool is almost clear or light green. This cleans out your colon in preparation for the procedure.  Do not eat or drink anything else once you have started the bowel prep, unless your health care provider tells you it is safe to do so.  Arrange for someone to drive you home after the  procedure. PROCEDURE   You will be given medicine to help you relax (sedative).  You will lie on your side with your knees bent.  A long, flexible tube with a light and camera on the end (colonoscope) will be inserted through the rectum and into the colon. The camera sends video back to a computer screen as it moves through the colon. The colonoscope also releases carbon dioxide gas to inflate the colon. This helps your health care provider see the area better.  During the exam, your health care provider may take  a small tissue sample (biopsy) to be examined under a microscope if any abnormalities are found.  The exam is finished when the entire colon has been viewed. AFTER THE PROCEDURE   Do not drive for 24 hours after the exam.  You may have a small amount of blood in your stool.  You may pass moderate amounts of gas and have mild abdominal cramping or bloating. This is caused by the gas used to inflate your colon during the exam.  Ask when your test results will be ready and how you will get your results. Make sure you get your test results. Document Released: 10/11/2000 Document Revised: 08/04/2013 Document Reviewed: 06/21/2013 [facility] Patient Information 2014 [place], [place].    PATIENT INSTRUCTIONS POST-ANESTHESIA  IMMEDIATELY FOLLOWING SURGERY:  Do not drive or operate machinery for the first twenty four hours after surgery.  Do not make any important decisions for twenty four hours after surgery or while taking narcotic pain medications or sedatives.  If you develop intractable nausea and vomiting or a severe headache please notify your doctor immediately.  FOLLOW-UP:  Please make an appointment with your surgeon as instructed. You do not need to follow up with anesthesia unless specifically instructed to do so.  WOUND CARE INSTRUCTIONS (if applicable):  Keep a dry clean dressing on the anesthesia/puncture wound site if there is drainage.  Once the wound has quit draining  you may leave it open to air.  Generally you should leave the bandage intact for twenty four hours unless there is drainage.  If the epidural site drains for more than 36-48 hours please call the anesthesia department.  QUESTIONS?:  Please feel free to call your physician or the hospital operator if you have any questions, and they will be happy to assist you.

## 2014-01-18 NOTE — Telephone Encounter (Signed)
Pt was returning Leighann's phone call. Please advise [phone]

## 2014-01-18 NOTE — Telephone Encounter (Signed)
I spoke to him to R/S procedure

## 2014-01-19 ENCOUNTER — Encounter (HOSPITAL_COMMUNITY)
Admission: RE | Admit: 2014-01-19 | Discharge: 2014-01-19 | Disposition: A | Discharge status: Home / Self Care | Payer: PRIVATE HEALTH INSURANCE | Source: Ambulatory Visit | Attending: Internal Medicine | Admitting: Internal Medicine

## 2014-01-31 ENCOUNTER — Encounter (HOSPITAL_COMMUNITY)
Admission: RE | Admit: 2014-01-31 | Discharge: 2014-01-31 | Disposition: A | Discharge status: Home / Self Care | Payer: PRIVATE HEALTH INSURANCE | Source: Ambulatory Visit | Attending: Internal Medicine | Admitting: Internal Medicine

## 2014-01-31 ENCOUNTER — Other Ambulatory Visit: Payer: Self-pay | Admitting: Gastroenterology

## 2014-01-31 ENCOUNTER — Telehealth: Payer: Self-pay | Admitting: Gastroenterology

## 2014-01-31 NOTE — Patient Instructions (Signed)
Keano O Maler  01/31/2014   Your procedure is scheduled on:  02/03/2014  Report to [facility] at  845  AM.  Call this number if you have problems the morning of surgery: [phone]   Remember:   Do not eat food or drink liquids after midnight.   Take these medicines the morning of surgery with A SIP OF WATER:  Flexaril, valium, hydrocodone, prilosec   Do not wear jewelry, make-up or nail polish.  Do not wear lotions, powders, or perfumes.   Do not shave 48 hours prior to surgery. Men may shave face and neck.  Do not bring valuables to the hospital.  [facility] is not responsible  for any belongings or valuables.               Contacts, dentures or bridgework may not be worn into surgery.  Leave suitcase in the car. After surgery it may be brought to your room.  For patients admitted to the hospital, discharge time is determined by your treatment team.               Patients discharged the day of surgery will not be allowed to drive home.  Name and phone number of your driver: family  Special Instructions: N/A   Please read over the following fact sheets that you were given: Pain Booklet, Coughing and Deep Breathing, Surgical Site Infection Prevention, Anesthesia Post-op Instructions and Care and Recovery After Surgery Colonoscopy A colonoscopy is an exam to look at the entire large intestine (colon). This exam can help find problems such as tumors, polyps, inflammation, and areas of bleeding. The exam takes about 1 hour.  LET [facility] PROVIDER KNOW ABOUT:   Any allergies you have.  All medicines you are taking, including vitamins, herbs, eye drops, creams, and over-the-counter medicines.  Previous problems you or members of your family have had with the use of anesthetics.  Any blood disorders you have.  Previous surgeries you have had.  Medical conditions you have. RISKS AND COMPLICATIONS  Generally, this is a safe procedure. However, as with any  procedure, complications can occur. Possible complications include:  Bleeding.  Tearing or rupture of the colon wall.  Reaction to medicines given during the exam.  Infection (rare). BEFORE THE PROCEDURE   Ask your health care provider about changing or stopping your regular medicines.  You may be prescribed an oral bowel prep. This involves drinking a large amount of medicated liquid, starting the day before your procedure. The liquid will cause you to have multiple loose stools until your stool is almost clear or light green. This cleans out your colon in preparation for the procedure.  Do not eat or drink anything else once you have started the bowel prep, unless your health care provider tells you it is safe to do so.  Arrange for someone to drive you home after the procedure. PROCEDURE   You will be given medicine to help you relax (sedative).  You will lie on your side with your knees bent.  A long, flexible tube with a light and camera on the end (colonoscope) will be inserted through the rectum and into the colon. The camera sends video back to a computer screen as it moves through the colon. The colonoscope also releases carbon dioxide gas to inflate the colon. This helps your health care provider see the area better.  During the exam, your health care provider may take a  small tissue sample (biopsy) to be examined under a microscope if any abnormalities are found.  The exam is finished when the entire colon has been viewed. AFTER THE PROCEDURE   Do not drive for 24 hours after the exam.  You may have a small amount of blood in your stool.  You may pass moderate amounts of gas and have mild abdominal cramping or bloating. This is caused by the gas used to inflate your colon during the exam.  Ask when your test results will be ready and how you will get your results. Make sure you get your test results. Document Released: 10/11/2000 Document Revised: 08/04/2013 Document  Reviewed: 06/21/2013 [facility] Patient Information 2014 [place]. Esophagogastroduodenoscopy Esophagogastroduodenoscopy (EGD) is a procedure to examine the lining of the esophagus, stomach, and first part of the small intestine (duodenum). A long, flexible, lighted tube with a camera attached (endoscope) is inserted down the throat to view these organs. This procedure is done to detect problems or abnormalities, such as inflammation, bleeding, ulcers, or growths, in order to treat them. The procedure lasts about 5 20 minutes. It is usually an outpatient procedure, but it may need to be performed in emergency cases in the hospital. LET YOUR CAREGIVER KNOW ABOUT:   Allergies to food or medicine.  All medicines you are taking, including vitamins, herbs, eyedrops, and over-the-counter medicines and creams.  Use of steroids (by mouth or creams).  Previous problems you or members of your family have had with the use of anesthetics.  Any blood disorders you have.  Previous surgeries you have had.  Other health problems you have.  Possibility of pregnancy, if this applies. RISKS AND COMPLICATIONS  Generally, EGD is a safe procedure. However, as with any procedure, complications can occur. Possible complications include:  Infection.  Bleeding.  Tearing (perforation) of the esophagus, stomach, or duodenum.  Difficulty breathing or not being able to breath.  Excessive sweating.  Spasms of the larynx.  Slowed heartbeat.  Low blood pressure. BEFORE THE PROCEDURE  Do not eat or drink anything for 6 8 hours before the procedure or as directed by your caregiver.  Ask your caregiver about changing or stopping your regular medicines.  If you wear dentures, be prepared to remove them before the procedure.  Arrange for someone to drive you home after the procedure. PROCEDURE   A vein will be accessed to give medicines and fluids. A medicine to relax you (sedative) and a pain  reliever will be given through that access into the vein.  A numbing medicine (local anesthetic) may be sprayed on your throat for comfort and to stop you from gagging or coughing.  A mouth guard may be placed in your mouth to protect your teeth and to keep you from biting on the endoscope.  You will be asked to lie on your left side.  The endoscope is inserted down your throat and into the esophagus, stomach, and duodenum.  Air is put through the endoscope to allow your caregiver to view the lining of your esophagus clearly.  The esophagus, stomach, and duodenum is then examined. During the exam, your caregiver may:  Remove tissue to be examined under a microscope (biopsy) for inflammation, infection, or other medical problems.  Remove growths.  Remove objects (foreign bodies) that are stuck.  Treat any bleeding with medicines or other devices that stop tissues from bleeding (hot cauters, clipping devices).  Widen (dilate) or stretch narrowed areas of the esophagus and stomach.  The endoscope  will then be withdrawn. AFTER THE PROCEDURE  You will be taken to a recovery area to be monitored. You will be able to go home once you are stable and alert.  Do not eat or drink anything until the local anesthetic and numbing medicines have worn off. You may choke.  It is normal to feel bloated, have pain with swallowing, or have a sore throat for a short time. This will wear off.  Your caregiver should be able to discuss his or her findings with you. It will take longer to discuss the test results if any biopsies were taken. Document Released: 02/14/2005 Document Revised: 09/30/2012 Document Reviewed: 09/16/2012 [facility] Patient Information 2014 [place], [place]. PATIENT INSTRUCTIONS POST-ANESTHESIA  IMMEDIATELY FOLLOWING SURGERY:  Do not drive or operate machinery for the first twenty four hours after surgery.  Do not make any important decisions for twenty four hours after surgery or  while taking narcotic pain medications or sedatives.  If you develop intractable nausea and vomiting or a severe headache please notify your doctor immediately.  FOLLOW-UP:  Please make an appointment with your surgeon as instructed. You do not need to follow up with anesthesia unless specifically instructed to do so.  WOUND CARE INSTRUCTIONS (if applicable):  Keep a dry clean dressing on the anesthesia/puncture wound site if there is drainage.  Once the wound has quit draining you may leave it open to air.  Generally you should leave the bandage intact for twenty four hours unless there is drainage.  If the epidural site drains for more than 36-48 hours please call the anesthesia department.  QUESTIONS?:  Please feel free to call your physician or the hospital operator if you have any questions, and they will be happy to assist you.

## 2014-01-31 NOTE — Telephone Encounter (Signed)
Patient called and cx his TCS w/Propofol, he has hurt his leg and needs to take care of that first before TCS

## 2014-02-03 ENCOUNTER — Ambulatory Visit (HOSPITAL_COMMUNITY)
Admission: RE | Admit: 2014-02-03 | Payer: PRIVATE HEALTH INSURANCE | Source: Ambulatory Visit | Admitting: Internal Medicine

## 2014-02-03 ENCOUNTER — Encounter (HOSPITAL_COMMUNITY): Admission: RE | Payer: Self-pay | Source: Ambulatory Visit

## 2014-02-03 SURGERY — COLONOSCOPY WITH PROPOFOL
Anesthesia: Monitor Anesthesia Care

## 2014-04-02 ENCOUNTER — Other Ambulatory Visit: Payer: Self-pay | Admitting: Infectious Diseases

## 2014-04-02 DIAGNOSIS — B20 Human immunodeficiency virus [HIV] disease: Secondary | ICD-10-CM

## 2014-06-15 ENCOUNTER — Ambulatory Visit: Payer: Self-pay | Admitting: Infectious Diseases

## 2014-08-15 ENCOUNTER — Ambulatory Visit: Payer: 59 | Admitting: Infectious Diseases

## 2014-08-28 ENCOUNTER — Emergency Department (HOSPITAL_COMMUNITY): Payer: PRIVATE HEALTH INSURANCE

## 2014-08-28 ENCOUNTER — Encounter (HOSPITAL_COMMUNITY): Payer: Self-pay | Admitting: *Deleted

## 2014-08-28 ENCOUNTER — Emergency Department (HOSPITAL_COMMUNITY)
Admission: EM | Admit: 2014-08-28 | Discharge: 2014-08-28 | Disposition: A | Discharge status: Home / Self Care | Payer: PRIVATE HEALTH INSURANCE | Attending: Emergency Medicine | Admitting: Emergency Medicine

## 2014-08-28 DIAGNOSIS — W01198A Fall on same level from slipping, tripping and stumbling with subsequent striking against other object, initial encounter: Secondary | ICD-10-CM | POA: Insufficient documentation

## 2014-08-28 DIAGNOSIS — R0781 Pleurodynia: Secondary | ICD-10-CM

## 2014-08-28 DIAGNOSIS — Z21 Asymptomatic human immunodeficiency virus [HIV] infection status: Secondary | ICD-10-CM | POA: Diagnosis not present

## 2014-08-28 DIAGNOSIS — Z8739 Personal history of other diseases of the musculoskeletal system and connective tissue: Secondary | ICD-10-CM | POA: Insufficient documentation

## 2014-08-28 DIAGNOSIS — S299XXA Unspecified injury of thorax, initial encounter: Secondary | ICD-10-CM | POA: Diagnosis present

## 2014-08-28 DIAGNOSIS — Y9389 Activity, other specified: Secondary | ICD-10-CM | POA: Insufficient documentation

## 2014-08-28 DIAGNOSIS — Z720 Tobacco use: Secondary | ICD-10-CM | POA: Diagnosis not present

## 2014-08-28 DIAGNOSIS — S2231XA Fracture of one rib, right side, initial encounter for closed fracture: Secondary | ICD-10-CM | POA: Diagnosis not present

## 2014-08-28 DIAGNOSIS — Z79899 Other long term (current) drug therapy: Secondary | ICD-10-CM | POA: Diagnosis not present

## 2014-08-28 DIAGNOSIS — J449 Chronic obstructive pulmonary disease, unspecified: Secondary | ICD-10-CM | POA: Insufficient documentation

## 2014-08-28 DIAGNOSIS — I10 Essential (primary) hypertension: Secondary | ICD-10-CM | POA: Diagnosis not present

## 2014-08-28 DIAGNOSIS — Y9289 Other specified places as the place of occurrence of the external cause: Secondary | ICD-10-CM | POA: Diagnosis not present

## 2014-08-28 MED ORDER — HYDROMORPHONE HCL 1 MG/ML IJ SOLN
2.0000 mg | Freq: Once | INTRAMUSCULAR | Status: AC
  Administered 2014-08-28: 2 mg via INTRAMUSCULAR
  Filled 2014-08-28: qty 2

## 2014-08-28 MED ORDER — KETOROLAC TROMETHAMINE 60 MG/2ML IM SOLN
60.0000 mg | Freq: Once | INTRAMUSCULAR | Status: AC
  Administered 2014-08-28: 60 mg via INTRAMUSCULAR
  Filled 2014-08-28: qty 2

## 2014-08-28 MED ORDER — IBUPROFEN 600 MG PO TABS: 600.0000 mg | ORAL_TABLET | Freq: Three times a day (TID) | ORAL | Status: DC | PRN

## 2014-08-28 MED ORDER — OXYCODONE-ACETAMINOPHEN 5-325 MG PO TABS
1.0000 | ORAL_TABLET | Freq: Once | ORAL | Status: AC
  Administered 2014-08-28: 1 via ORAL
  Filled 2014-08-28: qty 1

## 2014-08-28 MED ORDER — ONDANSETRON 4 MG PO TBDP
8.0000 mg | ORAL_TABLET | Freq: Once | ORAL | Status: AC
  Administered 2014-08-28: 8 mg via ORAL
  Filled 2014-08-28: qty 2

## 2014-08-28 MED ORDER — OXYCODONE-ACETAMINOPHEN 5-325 MG PO TABS: 1.0000 | ORAL_TABLET | ORAL | Status: DC | PRN

## 2014-08-28 NOTE — ED Notes (Signed)
States percocet did not help pain at all. Pain in on right side of chest-- "feels like something is popping in and out."

## 2014-08-28 NOTE — ED Notes (Signed)
The pt fell 3 days ago  And he struck his rt lat ribs..  Pain since then

## 2014-08-28 NOTE — Discharge Instructions (Signed)

## 2014-08-28 NOTE — ED Provider Notes (Signed)
CSN: [id number]     Arrival date & time 08/28/14  0500 History   First MD Initiated Contact with Patient 08/28/14 [phone]     Chief Complaint  Patient presents with  . Rib Injury     HPI Patient reports severe pain in his right anterolateral chest after a fall 3 days ago.  He states he fell forward and struck his right chest on a coffee table.  He reports severe pain to palpation and when he takes a deep breath on his right side.  His pain is constant.  No shortness of breath.  Denies nausea vomiting.  No other injury.  Denies weakness of arms or legs.  Pain is moderate in severity.   Past Medical History  Diagnosis Date  . HIV infection   . Scoliosis 08/24/13  . Prolapsed disk 10/28  . Hypertension   . Chronic back pain   . COPD (chronic obstructive pulmonary disease)    Past Surgical History  Procedure Laterality Date  . Knee surgery      right   Family History  Problem Relation Age of Onset  . Diabetes Father   . Colon cancer Neg Hx    History  Substance Use Topics  . Smoking status: Current Every Day Smoker -- 1.00 packs/day for 30 years    Types: Cigarettes  . Smokeless tobacco: Never Used  . Alcohol Use: Yes     Comment: beer occasional, rare    Review of Systems  All other systems reviewed and are negative.     Allergies  Review of patient's allergies indicates no known allergies.  Home Medications   Prior to Admission medications   Medication Sig Start Date End Date Taking? Authorizing Provider  albuterol (PROVENTIL HFA;VENTOLIN HFA) 108 (90 BASE) MCG/ACT inhaler Inhale 2 puffs into the lungs every 4 (four) hours as needed for wheezing or shortness of breath.   Yes Historical Provider, MD  amitriptyline (ELAVIL) 50 MG tablet Take 50 mg by mouth at bedtime as needed for sleep.  09/12/13  Yes Historical Provider, MD  ATRIPLA 600-200-300 MG per tablet TAKE (1) TABLET BY MOUTH AT BEDTIME.   Yes Ginnie SmartJeffrey C Hatcher, MD  COMBIVENT RESPIMAT 20-100 MCG/ACT AERS  respimat Inhale 2 puffs into the lungs every 4 (four) hours as needed. wheezing 12/14/13  Yes Historical Provider, MD  cyclobenzaprine (FLEXERIL) 10 MG tablet Take 10 mg by mouth 3 (three) times daily as needed for muscle spasms.   Yes Historical Provider, MD  diazepam (VALIUM) 10 MG tablet Take 10 mg by mouth 3 (three) times daily as needed for anxiety.    Yes Historical Provider, MD  HYDROcodone-acetaminophen (NORCO) 10-325 MG per tablet Take 2 tablets by mouth every 4 (four) hours as needed for moderate pain.  11/21/13  Yes Historical Provider, MD  omeprazole (PRILOSEC) 40 MG capsule Take 40 mg by mouth daily.     Yes Historical Provider, MD  ibuprofen (ADVIL,MOTRIN) 600 MG tablet Take 1 tablet (600 mg total) by mouth every 8 (eight) hours as needed. 08/28/14   Lyanne CoKevin M Ikia Cincotta, MD  oxyCODONE-acetaminophen (PERCOCET/ROXICET) 5-325 MG per tablet Take 1 tablet by mouth every 4 (four) hours as needed for severe pain. 08/28/14   Lyanne CoKevin M Rosalba Totty, MD   BP 155/105 mmHg  Pulse 92  Temp(Src) 98.6 F (37 C) (Oral)  Resp 18  SpO2 98% Physical Exam  Constitutional: He is oriented to person, place, and time. He appears well-developed and well-nourished.  HENT:  Head: Normocephalic and  atraumatic.  Eyes: EOM are normal.  Neck: Normal range of motion.  Cardiovascular: Normal rate, regular rhythm, normal heart sounds and intact distal pulses.   Pulmonary/Chest: Effort normal and breath sounds normal. No respiratory distress.  Bruising and tenderness to the right lateral chest is present.  No crepitus.  No deformity noted.  Abdominal: Soft. He exhibits no distension. There is no tenderness.  Musculoskeletal: Normal range of motion.  Neurological: He is alert and oriented to person, place, and time.  Skin: Skin is warm and dry.  Psychiatric: He has a normal mood and affect. Judgment normal.  Nursing note and vitals reviewed.   ED Course  Procedures (including critical care time) Labs Review Labs Reviewed  - No data to display  Imaging Review Dg Ribs Unilateral W/chest Right  08/28/2014   CLINICAL DATA:  Larey SeatFell over table. Right rib injury. Injury 3 days ago.  EXAM: RIGHT RIBS AND CHEST - 3+ VIEW  COMPARISON:  05/25/2013  FINDINGS: Mediastinum and hilar structures are normal. Central atelectasis both lung bases. Thoracic spine scoliosis concave left. Right what appears to be the lateral portion of the right sixth rib fracture is noted. No pneumothorax.  IMPRESSION: Nondisplaced fracture of the lateral portion the right sixth rib. No pneumothorax. Bibasilar subsegmental atelectasis.   Electronically Signed   By: Maisie Fushomas  Register   On: 08/28/2014 08:05  I personally reviewed the imaging tests through PACS system I reviewed available ER/hospitalization records through the EMR    EKG Interpretation None      MDM   Final diagnoses:  Fracture of rib of right side, closed, initial encounter    Patient feels much better at this time.  Discharge home in good condition with instructions for pain control and incentive spirometry at home.    Lyanne CoKevin M Danetta Prom, MD 08/28/14 [phone]

## 2014-09-13 ENCOUNTER — Ambulatory Visit: Payer: 59 | Admitting: Infectious Diseases

## 2014-10-30 IMAGING — CR DG LUMBAR SPINE COMPLETE 4+V
5 series · 5 of 5 positions shown · non-contrast
Comparison: 11/29/2004 study.

CLINICAL DATA: History of back pain.  Pain in the low back with
tingling in legs.  No known injury.

LUMBAR SPINE - COMPLETE 4+ VIEW

[view not recorded (1 of 5)]
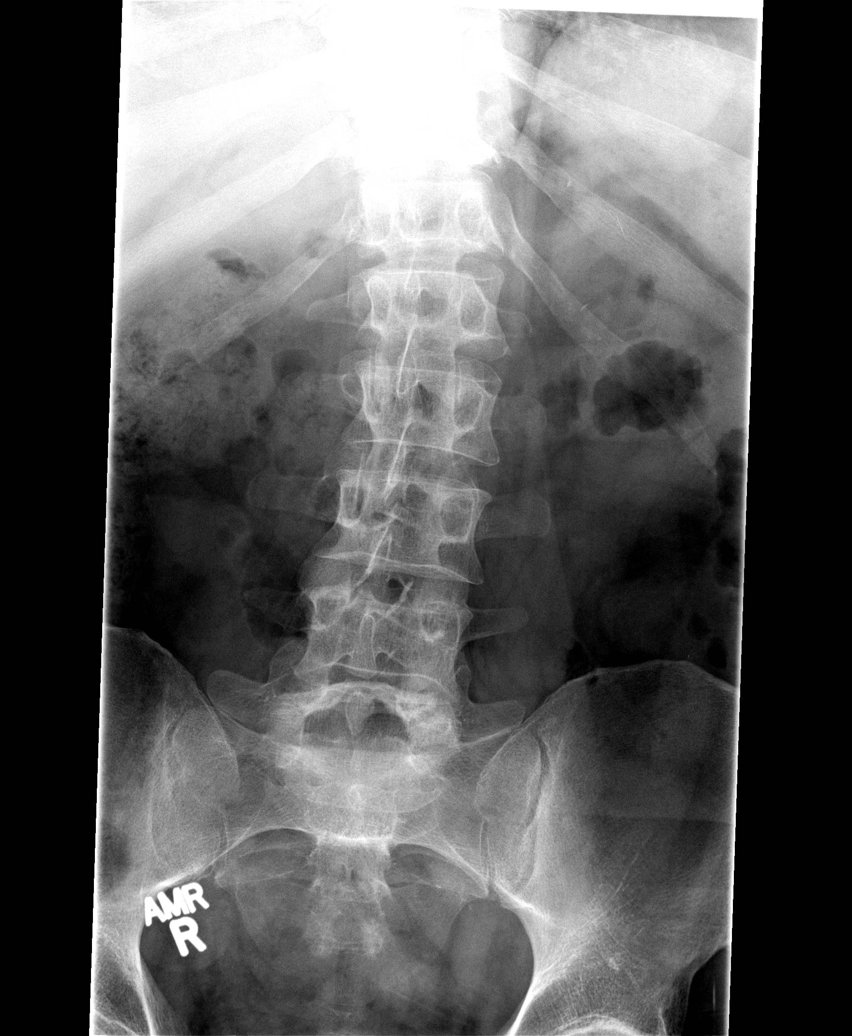

[view not recorded (2 of 5)]
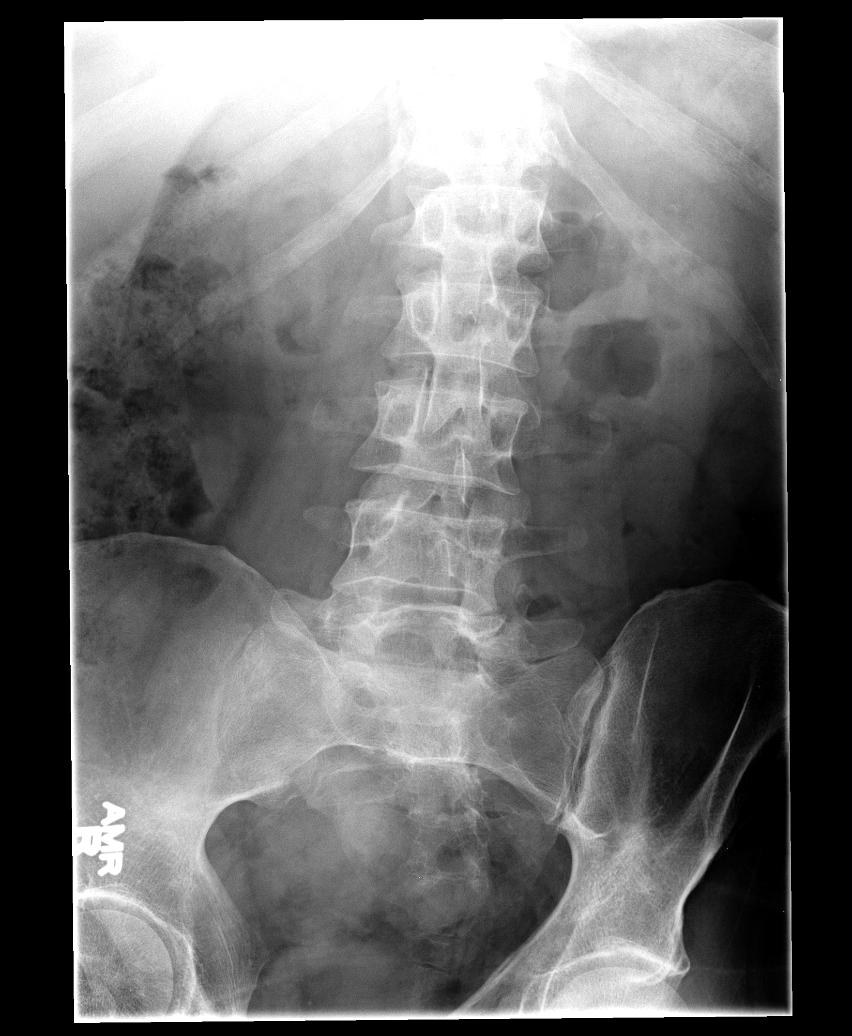

[view not recorded (3 of 5)]
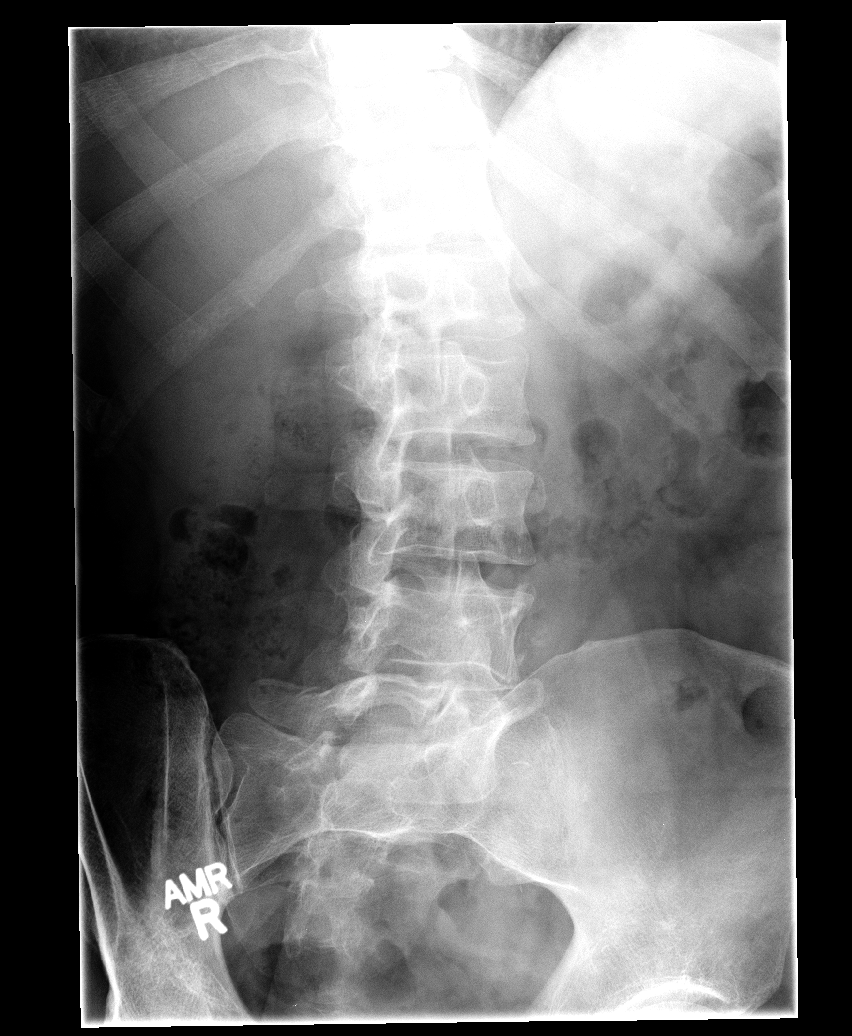

[view not recorded (4 of 5)]
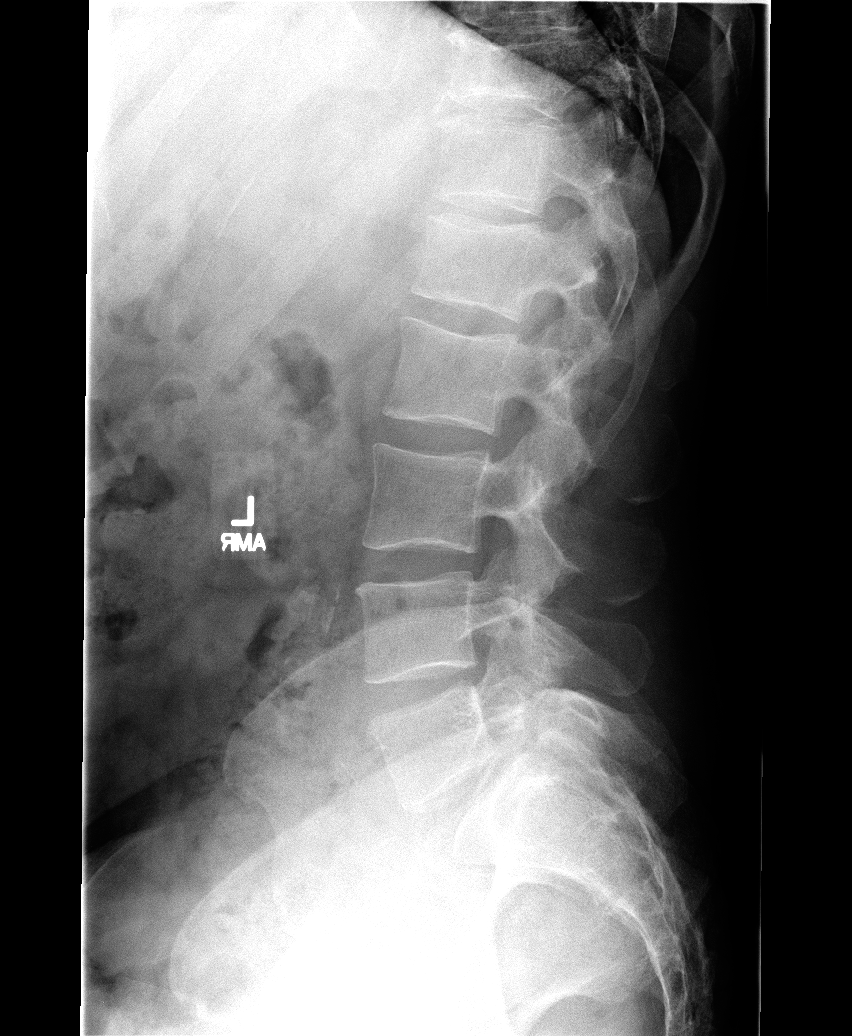

[view not recorded (5 of 5)]
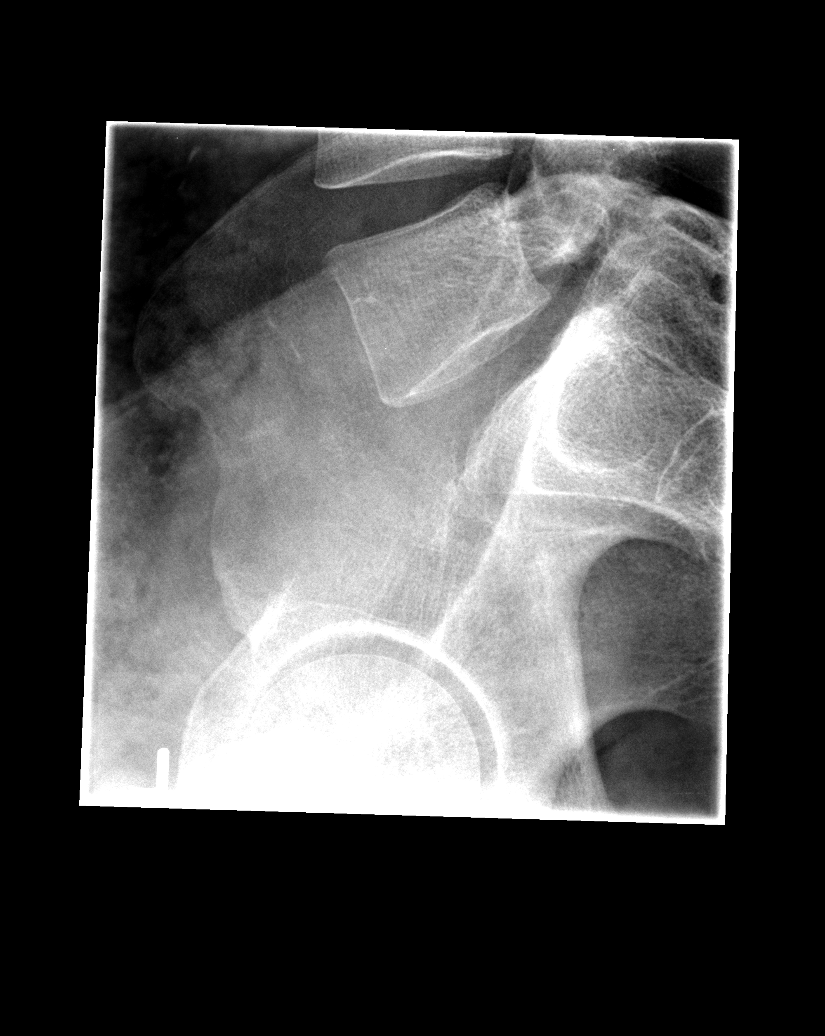

[5 of 5 positions shown; findings below may reference images not displayed]

FINDINGS: There are five non-rib bearing lumbar-type vertebral
bodies.  There is slight scoliosis convexity to the left.  SI
joints appear intact. Intervertebral disc spaces are preserved. No
fracture, bony destruction, or significant subluxation is seen.  No
definite pars defects are seen.  Apophyseal joint degenerative
spondylosis is present at the L5-S1 level.

There is moderate fecal distention of portions of the colon.
Nonaneurysmal aortic calcifications are present.
IMPRESSION: Slight scoliosis convexity to the left.  Changes of degenerative
spondylosis.  Minimal nonaneurysmal aortic calcifications.
Moderate fecal distention of portions of the colon.

## 2015-01-16 ENCOUNTER — Other Ambulatory Visit (HOSPITAL_COMMUNITY): Payer: Self-pay | Admitting: Family Medicine

## 2015-01-16 ENCOUNTER — Ambulatory Visit (HOSPITAL_COMMUNITY)
Admission: RE | Admit: 2015-01-16 | Discharge: 2015-01-16 | Disposition: A | Discharge status: Home / Self Care | Payer: Medicare Other | Source: Ambulatory Visit | Attending: Family Medicine | Admitting: Family Medicine

## 2015-01-16 DIAGNOSIS — M4854XA Collapsed vertebra, not elsewhere classified, thoracic region, initial encounter for fracture: Secondary | ICD-10-CM | POA: Diagnosis not present

## 2015-01-16 DIAGNOSIS — M5441 Lumbago with sciatica, right side: Secondary | ICD-10-CM

## 2015-01-16 DIAGNOSIS — M419 Scoliosis, unspecified: Secondary | ICD-10-CM | POA: Diagnosis not present

## 2015-01-16 DIAGNOSIS — I708 Atherosclerosis of other arteries: Secondary | ICD-10-CM | POA: Insufficient documentation

## 2015-01-16 DIAGNOSIS — M545 Low back pain: Secondary | ICD-10-CM | POA: Diagnosis present

## 2015-01-16 DIAGNOSIS — M47896 Other spondylosis, lumbar region: Secondary | ICD-10-CM | POA: Diagnosis not present

## 2015-02-14 ENCOUNTER — Other Ambulatory Visit: Payer: Self-pay | Admitting: Orthopedic Surgery

## 2015-02-14 DIAGNOSIS — M419 Scoliosis, unspecified: Secondary | ICD-10-CM

## 2015-02-17 ENCOUNTER — Other Ambulatory Visit: Payer: Self-pay | Admitting: Orthopedic Surgery

## 2015-02-17 DIAGNOSIS — M419 Scoliosis, unspecified: Secondary | ICD-10-CM

## 2015-02-24 ENCOUNTER — Other Ambulatory Visit: Payer: Self-pay | Admitting: Orthopedic Surgery

## 2015-02-24 DIAGNOSIS — Z77018 Contact with and (suspected) exposure to other hazardous metals: Secondary | ICD-10-CM

## 2015-02-27 ENCOUNTER — Ambulatory Visit
Admission: RE | Admit: 2015-02-27 | Discharge: 2015-02-27 | Disposition: A | Discharge status: Home / Self Care | Payer: Medicare Other | Source: Ambulatory Visit | Attending: Orthopedic Surgery | Admitting: Orthopedic Surgery

## 2015-02-27 DIAGNOSIS — Z77018 Contact with and (suspected) exposure to other hazardous metals: Secondary | ICD-10-CM

## 2015-02-27 DIAGNOSIS — M419 Scoliosis, unspecified: Secondary | ICD-10-CM

## 2015-04-25 ENCOUNTER — Other Ambulatory Visit: Payer: Self-pay | Admitting: Family Medicine

## 2015-04-25 DIAGNOSIS — M545 Low back pain: Secondary | ICD-10-CM

## 2015-04-28 ENCOUNTER — Ambulatory Visit
Admission: RE | Admit: 2015-04-28 | Discharge: 2015-04-28 | Disposition: A | Discharge status: Home / Self Care | Payer: Medicare Other | Source: Ambulatory Visit | Attending: Family Medicine | Admitting: Family Medicine

## 2015-04-28 DIAGNOSIS — M545 Low back pain: Secondary | ICD-10-CM

## 2015-04-28 MED ORDER — IOHEXOL 180 MG/ML  SOLN
1.0000 mL | Freq: Once | INTRAMUSCULAR | Status: AC | PRN
  Administered 2015-04-28: 1 mL via EPIDURAL

## 2015-04-28 MED ORDER — METHYLPREDNISOLONE ACETATE 40 MG/ML INJ SUSP (RADIOLOG
120.0000 mg | Freq: Once | INTRAMUSCULAR | Status: AC
  Administered 2015-04-28: 120 mg via EPIDURAL

## 2015-04-28 NOTE — Discharge Instructions (Signed)

## 2015-09-14 DIAGNOSIS — M41125 Adolescent idiopathic scoliosis, thoracolumbar region: Secondary | ICD-10-CM | POA: Insufficient documentation

## 2015-09-19 ENCOUNTER — Emergency Department (HOSPITAL_COMMUNITY)
Admission: EM | Admit: 2015-09-19 | Discharge: 2015-09-19 | Disposition: A | Discharge status: Home / Self Care | Payer: Medicare Other | Attending: Emergency Medicine | Admitting: Emergency Medicine

## 2015-09-19 ENCOUNTER — Encounter (HOSPITAL_COMMUNITY): Payer: Self-pay | Admitting: Emergency Medicine

## 2015-09-19 DIAGNOSIS — Z21 Asymptomatic human immunodeficiency virus [HIV] infection status: Secondary | ICD-10-CM | POA: Diagnosis not present

## 2015-09-19 DIAGNOSIS — M419 Scoliosis, unspecified: Secondary | ICD-10-CM | POA: Diagnosis not present

## 2015-09-19 DIAGNOSIS — M545 Low back pain: Secondary | ICD-10-CM | POA: Diagnosis present

## 2015-09-19 DIAGNOSIS — G8929 Other chronic pain: Secondary | ICD-10-CM | POA: Insufficient documentation

## 2015-09-19 DIAGNOSIS — F17210 Nicotine dependence, cigarettes, uncomplicated: Secondary | ICD-10-CM | POA: Insufficient documentation

## 2015-09-19 DIAGNOSIS — I10 Essential (primary) hypertension: Secondary | ICD-10-CM | POA: Insufficient documentation

## 2015-09-19 DIAGNOSIS — Z79899 Other long term (current) drug therapy: Secondary | ICD-10-CM | POA: Diagnosis not present

## 2015-09-19 DIAGNOSIS — M549 Dorsalgia, unspecified: Secondary | ICD-10-CM

## 2015-09-19 DIAGNOSIS — J449 Chronic obstructive pulmonary disease, unspecified: Secondary | ICD-10-CM | POA: Insufficient documentation

## 2015-09-19 NOTE — ED Provider Notes (Signed)
CSN: [id number]   Arrival date & time 09/19/15 2103  History  By signing my name below, I, Bethel Born, attest that this documentation has been prepared under the direction and in the presence of Rolm Gala Rashonda Warrior PA-C Electronically Signed: Bethel Born, ED Scribe. 09/19/2015. 9:41 PM. Chief Complaint  Patient presents with  . Back Pain    HPI The history is provided by the patient. No language interpreter was used.   Devin [name] is a 54 y.o. male with history of chronic back pain, scoliosis, sciatica, HTN, and HIV who presents to the Emergency Department complaining of constant, sharp, 10/10 in severity chronic lower back pain with onset of 9-10 years. The pain radiates down the back of both legs to the feet. Pt states that it feels as if his toes are burning. He states that movement and sitting up straight exacerbate the pain. Denies alleviating factors. The pain worsened today after sitting upright in the waiting room of his PCP's office. He was seen by his PCP today to discuss his pain management stating that his 10-325 hydrocodone are no longer effectively managing his pain after "9 or 10 years of it". Pt states that he was under the impression that his doctor was prescribing him something stronger and is very upset that he did not receive higher dose pain medication. He has also used gabapentin in the past without much relief. He was seen at a spine clinic on 09/14/15 but is not in agreement with the proposed plan for laser therapy. He states that he has scheduled surgery on his back but is unsure when it will take place. Pt has been ambulatory at home but states that he moves as little as possible. Denies fevers, chills, headaches, abdominal pain, incontinence of bowel or bladder, numbness in groin or lower extremities, weakness in lower extremities or rashes.   Past Medical History  Diagnosis Date  . HIV infection (HCC)   . Scoliosis 08/24/13  . Prolapsed disk 10/28  . Hypertension    . Chronic back pain   . COPD (chronic obstructive pulmonary disease) [facility])     Past Surgical History  Procedure Laterality Date  . Knee surgery      right    Family History  Problem Relation Age of Onset  . Diabetes Father   . Colon cancer Neg Hx     Social History  Substance Use Topics  . Smoking status: Current Every Day Smoker -- 1.00 packs/day for 30 years    Types: Cigarettes  . Smokeless tobacco: Never Used  . Alcohol Use: Yes     Comment: beer occasional, rare     Review of Systems  Constitutional: Negative for fever and chills.  Respiratory: Negative for shortness of breath.   Cardiovascular: Negative for chest pain.  Gastrointestinal: Negative for abdominal pain.  Genitourinary: Negative for difficulty urinating.  Musculoskeletal: Positive for back pain. Negative for gait problem.  Skin: Negative for rash.  Neurological: Negative for weakness, numbness and headaches.  All other systems reviewed and are negative.  Home Medications   Prior to Admission medications   Medication Sig Start Date End Date Taking? Authorizing Provider  albuterol (PROVENTIL HFA;VENTOLIN HFA) 108 (90 BASE) MCG/ACT inhaler Inhale 2 puffs into the lungs every 4 (four) hours as needed for wheezing or shortness of breath.    Historical Provider, MD  amitriptyline (ELAVIL) 50 MG tablet Take 50 mg by mouth at bedtime as needed for sleep.  09/12/13   Historical Provider, MD  ATRIPLA 600-200-300 MG per tablet TAKE (1) TABLET BY MOUTH AT BEDTIME.    Ginnie Smart, MD  COMBIVENT RESPIMAT 20-100 MCG/ACT AERS respimat Inhale 2 puffs into the lungs every 4 (four) hours as needed. wheezing 12/14/13   Historical Provider, MD  cyclobenzaprine (FLEXERIL) 10 MG tablet Take 10 mg by mouth 3 (three) times daily as needed for muscle spasms.    Historical Provider, MD  diazepam (VALIUM) 10 MG tablet Take 10 mg by mouth 3 (three) times daily as needed for anxiety.     Historical Provider, MD   HYDROcodone-acetaminophen (NORCO) 10-325 MG per tablet Take 2 tablets by mouth every 4 (four) hours as needed for moderate pain.  11/21/13   Historical Provider, MD  ibuprofen (ADVIL,MOTRIN) 600 MG tablet Take 1 tablet (600 mg total) by mouth every 8 (eight) hours as needed. 08/28/14   Azalia Bilis, MD  omeprazole (PRILOSEC) 40 MG capsule Take 40 mg by mouth daily.      Historical Provider, MD  oxyCODONE-acetaminophen (PERCOCET/ROXICET) 5-325 MG per tablet Take 1 tablet by mouth every 4 (four) hours as needed for severe pain. 08/28/14   Azalia Bilis, MD    Allergies  Review of patient's allergies indicates no known allergies.  Triage Vitals: BP 155/112 mmHg  Pulse 100  Temp(Src) 98.3 F (36.8 C) (Oral)  Resp 22  SpO2 97%  Physical Exam  Constitutional: He appears well-developed and well-nourished. No distress.  Patient appears uncomfortable. Moving very slowly on stretcher when asked to follow commands.  HENT:  Head: Normocephalic and atraumatic.  Right Ear: External ear normal.  Left Ear: External ear normal.  Eyes: Conjunctivae are normal. Right eye exhibits no discharge. Left eye exhibits no discharge. No scleral icterus.  Neck: Normal range of motion.  Cardiovascular: Normal rate and intact distal pulses.   Pedal pulses palpable. Cap refill less than 3 seconds.  Pulmonary/Chest: Effort normal.  Musculoskeletal: Normal range of motion.  Mild lumbar tenderness. Tenderness is generalized without bony tenderness over the lumbar spine. No bony deformities or step-offs. Patient refuses to perform straight leg raises bilaterally due to pain. Full range of motion of ankles and toes intact.  Neurological: He is alert. He exhibits normal muscle tone. Coordination normal.  5 out of 5 strength of the ankles bilaterally. Refusing strength testing of hips. Sensation to light touch intact throughout.  Skin: Skin is warm and dry. No rash noted.  No rash over lumbar region.  Psychiatric: He has a  normal mood and affect. His behavior is normal.  Nursing note and vitals reviewed.   ED Course  Procedures  DIAGNOSTIC STUDIES: Oxygen Saturation is 97% on RA,  normal by my interpretation.    COORDINATION OF CARE: 9:38 PM Discussed treatment plan which includes discharge to f/u with his PCP with pt at bedside and pt agreed to the plan.  Labs Review- Labs Reviewed - No data to display  Imaging Review No results found.  MDM   Final diagnoses:  Chronic back pain   Patient presenting with chronic back pain. He states that he has had lumbar back pain with radiation into both thighs for multiple years. He has been seen by a spine specialist and his PCP for these complaints. He reports no change in his pain today. He notes that he saw his primary care provider a few hours before presentation to the emergency department. He states that they were in an argument because the provider would not prescribe stronger narcotic pain medicine. Patient appears  uncomfortable on initial exam. He moves very slowly on the stretcher and moans. He refuses strength and range of motion testing of the lower extremities. 5 out of 5 ankle strength and sensation is intact over the lower extremities. Discussed with patient that I would not be providing stronger narcotic pain medicine. Discussed that he is already being followed by a primary care provider for his chronic pain which is appropriate. Patient became very upset hearing this, sat up quickly in bed and began cursing at the staff. Upon discharge, patient refused to sign the papers and walked out of the emergency with a quick and steady gait.  I personally performed the services described in this documentation, which was scribed in my presence. The recorded information has been reviewed and is accurate.      Rolm GalaStevi Shenee Wignall, PA-C 09/19/15 2227  Leta BaptistEmily Roe Nguyen, MD 09/21/15 1050

## 2015-09-19 NOTE — ED Notes (Signed)
t stable, ambulatory, states understanding of discharge instructions

## 2015-09-19 NOTE — Discharge Instructions (Signed)
Keep your scheduled appointment with her spine specialist. Call your primary care provider to discuss long-term pain control. Return to the emergency department with any new, worsening or concerning symptoms.   Back Exercises The following exercises strengthen the muscles that help to support the back. They also help to keep the lower back flexible. Doing these exercises can help to prevent back pain or lessen existing pain. If you have back pain or discomfort, try doing these exercises 2-3 times each day or as told by your health care provider. When the pain goes away, do them once each day, but increase the number of times that you repeat the steps for each exercise (do more repetitions). If you do not have back pain or discomfort, do these exercises once each day or as told by your health care provider. EXERCISES Single Knee to Chest Repeat these steps 3-5 times for each leg:  Lie on your back on a firm bed or the floor with your legs extended.  Bring one knee to your chest. Your other leg should stay extended and in contact with the floor.  Hold your knee in place by grabbing your knee or thigh.  Pull on your knee until you feel a gentle stretch in your lower back.  Hold the stretch for 10-30 seconds.  Slowly release and straighten your leg. Pelvic Tilt Repeat these steps 5-10 times:  Lie on your back on a firm bed or the floor with your legs extended.  Bend your knees so they are pointing toward the ceiling and your feet are flat on the floor.  Tighten your lower abdominal muscles to press your lower back against the floor. This motion will tilt your pelvis so your tailbone points up toward the ceiling instead of pointing to your feet or the floor.  With gentle tension and even breathing, hold this position for 5-10 seconds. Cat-Cow Repeat these steps until your lower back becomes more flexible:  Get into a hands-and-knees position on a firm surface. Keep your hands under your  shoulders, and keep your knees under your hips. You may place padding under your knees for comfort.  Let your head hang down, and point your tailbone toward the floor so your lower back becomes rounded like the back of a cat.  Hold this position for 5 seconds.  Slowly lift your head and point your tailbone up toward the ceiling so your back forms a sagging arch like the back of a cow.  Hold this position for 5 seconds. Press-Ups Repeat these steps 5-10 times: 1. Lie on your abdomen (face-down) on the floor. 2. Place your palms near your head, about shoulder-width apart. 3. While you keep your back as relaxed as possible and keep your hips on the floor, slowly straighten your arms to raise the top half of your body and lift your shoulders. Do not use your back muscles to raise your upper torso. You may adjust the placement of your hands to make yourself more comfortable. 4. Hold this position for 5 seconds while you keep your back relaxed. 5. Slowly return to lying flat on the floor. Bridges Repeat these steps 10 times: 1. Lie on your back on a firm surface. 2. Bend your knees so they are pointing toward the ceiling and your feet are flat on the floor. 3. Tighten your buttocks muscles and lift your buttocks off of the floor until your waist is at almost the same height as your knees. You should feel the muscles working  in your buttocks and the back of your thighs. If you do not feel these muscles, slide your feet 1-2 inches farther away from your buttocks. 4. Hold this position for 3-5 seconds. 5. Slowly lower your hips to the starting position, and allow your buttocks muscles to relax completely. If this exercise is too easy, try doing it with your arms crossed over your chest. Abdominal Crunches Repeat these steps 5-10 times: 1. Lie on your back on a firm bed or the floor with your legs extended. 2. Bend your knees so they are pointing toward the ceiling and your feet are flat on the  floor. 3. Cross your arms over your chest. 4. Tip your chin slightly toward your chest without bending your neck. 5. Tighten your abdominal muscles and slowly raise your trunk (torso) high enough to lift your shoulder blades a tiny bit off of the floor. Avoid raising your torso higher than that, because it can put too much stress on your low back and it does not help to strengthen your abdominal muscles. 6. Slowly return to your starting position. Back Lifts Repeat these steps 5-10 times: 1. Lie on your abdomen (face-down) with your arms at your sides, and rest your forehead on the floor. 2. Tighten the muscles in your legs and your buttocks. 3. Slowly lift your chest off of the floor while you keep your hips pressed to the floor. Keep the back of your head in line with the curve in your back. Your eyes should be looking at the floor. 4. Hold this position for 3-5 seconds. 5. Slowly return to your starting position. SEEK MEDICAL CARE IF:  Your back pain or discomfort gets much worse when you do an exercise.  Your back pain or discomfort does not lessen within 2 hours after you exercise. If you have any of these problems, stop doing these exercises right away. Do not do them again unless your health care provider says that you can. SEEK IMMEDIATE MEDICAL CARE IF:  You develop sudden, severe back pain. If this happens, stop doing the exercises right away. Do not do them again unless your health care provider says that you can.   This information is not intended to replace advice given to you by your health care provider. Make sure you discuss any questions you have with your health care provider.   Document Released: 11/21/2004 Document Revised: 07/05/2015 Document Reviewed: 12/08/2014 Elsevier Interactive Patient Education 2016 Else[employer] Inc.  Chronic Back Pain  When back pain lasts longer than 3 months, it is called chronic back pain.People with chronic back pain often go through certain  periods that are more intense (flare-ups).  CAUSES Chronic back pain can be caused by wear and tear (degeneration) on different structures in your back. These structures include:  The bones of your spine (vertebrae) and the joints surrounding your spinal cord and nerve roots (facets).  The strong, fibrous tissues that connect your vertebrae (ligaments). Degeneration of these structures may result in pressure on your nerves. This can lead to constant pain. HOME CARE INSTRUCTIONS  Avoid bending, heavy lifting, prolonged sitting, and activities which make the problem worse.  Take brief periods of rest throughout the day to reduce your pain. Lying down or standing usually is better than sitting while you are resting.  Take over-the-counter or prescription medicines only as directed by your caregiver. SEEK IMMEDIATE MEDICAL CARE IF:   You have weakness or numbness in one of your legs or feet.  You have  trouble controlling your bladder or bowels.  You have nausea, vomiting, abdominal pain, shortness of breath, or fainting.   This information is not intended to replace advice given to you by your health care provider. Make sure you discuss any questions you have with your health care provider.   Document Released: 11/21/2004 Document Revised: 01/06/2012 Document Reviewed: 04/03/2015 Elsevier Interactive Patient Education [employer]2016 Else[employer] Inc.

## 2015-09-19 NOTE — ED Notes (Signed)
Please see PA assessment 

## 2015-09-19 NOTE — ED Notes (Signed)
Pt refused discharge papers, refused to sign.

## 2015-09-19 NOTE — ED Notes (Addendum)
Pt states he has chronic back pain for over the last several years and 1 year ago had some broken ribs and since then has had an increase in pain in in upper back that radiates into lower back and sends sharp pains all the way to both legs. Pt denies any new injury. Pt denies any numbness or tingling in legs or loss of bladder or bowel. Pt states his pain has been this way for several months now.

## 2016-01-12 ENCOUNTER — Other Ambulatory Visit (HOSPITAL_COMMUNITY): Payer: Self-pay | Admitting: Family Medicine

## 2016-01-12 ENCOUNTER — Ambulatory Visit (HOSPITAL_COMMUNITY)
Admission: RE | Admit: 2016-01-12 | Discharge: 2016-01-12 | Disposition: A | Discharge status: Home / Self Care | Payer: Medicare Other | Source: Ambulatory Visit | Attending: Family Medicine | Admitting: Family Medicine

## 2016-01-12 DIAGNOSIS — R042 Hemoptysis: Secondary | ICD-10-CM

## 2016-01-12 DIAGNOSIS — J9811 Atelectasis: Secondary | ICD-10-CM | POA: Diagnosis not present

## 2016-01-17 ENCOUNTER — Ambulatory Visit: Payer: Medicare Other | Admitting: Infectious Diseases

## 2016-04-10 ENCOUNTER — Encounter (HOSPITAL_COMMUNITY): Payer: Self-pay | Admitting: Physical Therapy

## 2016-04-10 ENCOUNTER — Ambulatory Visit (HOSPITAL_COMMUNITY): Payer: Medicare Other | Attending: Family Medicine | Admitting: Physical Therapy

## 2016-04-10 DIAGNOSIS — M5442 Lumbago with sciatica, left side: Secondary | ICD-10-CM

## 2016-04-10 DIAGNOSIS — R2689 Other abnormalities of gait and mobility: Secondary | ICD-10-CM | POA: Insufficient documentation

## 2016-04-10 DIAGNOSIS — R29898 Other symptoms and signs involving the musculoskeletal system: Secondary | ICD-10-CM | POA: Insufficient documentation

## 2016-04-10 DIAGNOSIS — M6281 Muscle weakness (generalized): Secondary | ICD-10-CM | POA: Diagnosis present

## 2016-04-10 NOTE — Therapy (Signed)
[facility] [address] [place], [place], [address] Phone: [phone]   Fax:  [phone]  Physical Therapy Evaluation  Patient Details  Name: Devin [name] MRN: [id number] Date of Birth: [birth year] Referring Provider: John Giovanni, MD  Encounter Date: 04/10/2016      PT End of Session - 04/10/16 1225    Visit Number 1   Number of Visits 13   Date for PT Re-Evaluation 05/01/16   Authorization Type medicare   Authorization Time Period 04/10/16 to 05/24/16   PT Start Time 1124  pt signing in and feeling out paperwork   PT Stop Time 1210   PT Time Calculation (min) 46 min   Activity Tolerance Patient limited by pain   Behavior During Therapy Restless      Past Medical History  Diagnosis Date  . HIV infection (HCC)   . Scoliosis 08/24/13  . Prolapsed disk 10/28  . Hypertension   . Chronic back pain   . COPD (chronic obstructive pulmonary disease) [facility])     Past Surgical History  Procedure Laterality Date  . Knee surgery      right    There were no vitals filed for this visit.       Subjective Assessment - 04/10/16 1127    Subjective pt reports he has been having back pain for many years. Last year he broke several ribs and since then his pain has been severe. He reports cramping in his RLE, and sensation of fish pecking at his LLE. He also notes that he can't tolerate sitting or standing for too long and he has to lay down on his Lt side in the fetal position. He has been to several specialists and is supposed to have back surgery after he goes through PT. He isn't sure what all he is supposed to do in PT. He has seen a chiropractor in the past who was able to put his rib back into place and provide some relied momentarily. At times the pain is so severe he has considered ending his own life.   Patient is accompained by: Family member   Pertinent History Scoliosis, HTN, Chronic back pain, prolapsed disc, COPD, R knee  surgery   Limitations Other (comment)  everything   How long can you sit comfortably? 30-35min   How long can you stand comfortably? 30-67min   How long can you walk comfortably? unable to walk a block due to leg cramps   Diagnostic tests MRI/Xray: arthritis   Patient Stated Goals unsure what he will be able to do at PT   Currently in Pain? Yes   Pain Score 5   worst 10/10   Pain Location Back   Pain Orientation Mid;Lower   Pain Descriptors / Indicators Constant  "Bad"   Pain Radiating Towards radiates around his ribs   Pain Onset More than a month ago   Pain Frequency Constant   Aggravating Factors  anything   Pain Relieving Factors None    Effect of Pain on Daily Activities unable to do alot of ADLs            [facility] PT Assessment - 04/10/16 0001    Assessment   Medical Diagnosis LBP   Referring Provider John Giovanni, MD   Next MD Visit 2 months    Prior Therapy Chiropractor   Precautions   Precautions None   Restrictions   Weight Bearing Restrictions No   Balance Screen   Has the patient  fallen in the past 6 months No   Has the patient had a decrease in activity level because of a fear of falling?  No   Is the patient reluctant to leave their home because of a fear of falling?  No   Home Environment   Living Environment Private residence   Cognition   Overall Cognitive Status Within Functional Limits for tasks assessed   Observation/Other Assessments   Observations pt noticeably uncomfortable throughout the session, repositioning several times   Posture/Postural Control   Posture/Postural Control Postural limitations   Postural Limitations Weight shift right;Rounded Shoulders;Forward head;Decreased lumbar lordosis   ROM / Strength   AROM / PROM / Strength AROM;Strength   AROM   AROM Assessment Site Lumbar   Lumbar Flexion limited 25%, pain on Rt side of low back   Lumbar Extension limited 75%, pain Rt side low back   Lumbar - Right Rotation limtied 50%,  pain Rt side low back   Lumbar - Left Rotation 50% limited, pain Rt side low back   Strength   Strength Assessment Site Hip;Knee;Ankle   Right/Left Hip Right;Left   Right Hip Flexion 3+/5   Right Hip Extension 2+/5   Left Hip Flexion 3+/5   Left Hip Extension 2+/5   Right/Left Knee Right;Left   Right Knee Flexion 3+/5   Right Knee Extension 3+/5   Left Knee Flexion 3+/5   Left Knee Extension 3+/5   Right/Left Ankle Right;Left   Right Ankle Dorsiflexion 3+/5   Left Ankle Dorsiflexion 3+/5   Flexibility   Soft Tissue Assessment /Muscle Length yes   Hamstrings 50% limited BLE   Quadratus Lumborum 50% limited   Palpation   Palpation comment TTP along piriformis, QL, paraspinals R>L   Special Tests    Special Tests Lumbar   Lumbar Tests Slump Test;Straight Leg Raise   Slump test   Findings Positive   Comment BLE   Straight Leg Raise   Findings Positive   Comment BLE   Transfers   Transfers Sit to Stand;Stand to Sit   Comments increased time to complete and [facility]    Ambulation/Gait   Ambulation/Gait Yes   Assistive device Straight cane                   [facility] Adult PT Treatment/Exercise - 04/10/16 0001    Exercises   Exercises Lumbar   Lumbar Exercises: Supine   Other Supine Lumbar Exercises lower trunk rotation x20 each                PT Education - 04/10/16 1221    Education provided Yes   Education Details discussed eval findings/POC; importance of avoiding sedentary lifestyle to prevent furhter worsening of LBP; HEP; importance of deep abdominal strength/endurance to stabilize spine during activity; offered information redarding counseling to address suicidal ideations and to help cope with poor quality of life   Person(s) Educated Patient;Spouse   Methods Explanation;Demonstration   Comprehension Verbalized understanding;Returned demonstration;Need further instruction          PT Short Term Goals - 04/10/16 1234    PT  SHORT TERM GOAL #1   Title Pt will demo consistency and independence with HEP   Time 3   Period Weeks   Status New   PT SHORT TERM GOAL #2   Title Pt will demo correct log roll technique during transitions sit to/from supine throughout the session   Time 3   Period Weeks   Status  New   PT SHORT TERM GOAL #3   Title Pt will report no greater than 7/10 max pain throughout the day to improve tolerance to activity.   Time 3   Period Weeks   Status New           PT Long Term Goals - 04/10/16 1237    PT LONG TERM GOAL #1   Title Pt will demonstrate improved sitting tolerance evident by his ability to sit throughout an entire session without having to stand or reposition himself.   Time 6   Period Weeks   Status New   PT LONG TERM GOAL #2   Title Pt will report no greater than 5/10 pain throughout the day to improve his quality of life.   Time 6   Period Weeks   Status New   PT LONG TERM GOAL #3   Title Pt will demo improved BLE strength to atleast 4/5 to increase safety with functional mobility.   Time 6   Period Weeks   Status New               Plan - 04/10/16 1231    Clinical Impression Statement Pt presents to OPPT with chronic back pain of several years. He is currently not working, but was very active prior to the start of his pain which was exacerbated 1 year ago when he broke several ribs. His presentation is complicated in that he is highly sensitive to all special testing and his tolerance to positioning is limited as well. He has had history of treatment from a chiropractor with little help and he is potentially going to have surgery on his back once he tries PT for management of the pain. Examination reveals evident discomfort noted with repositioning throughout his session, limited mobility, impaired sensation, decreased ROM/strength of BLE all which is impacting his quality of life and ability to perform ADLs. He reports numbness in his LLE and issues with  bladder control which he attributes to other issues. All lumbar special testing for neural involvement was positive, however it is unclear whether this is due to the unclear response from the pt and high level of sensitivity he demonstrated with all activity/testing. Pt noting he has had several times where he has considered harming himself or ending his life due to the pain. A majority of the session was spent towards offering information regarding behavioral health counseling and encouraging the pt to see help in that area to manage his mental status, but he declined information at this time. Also discussed the nature of back pain and encouraged him to perform HEP within a tolerable pain level without avoiding too much activity for fear of pain. Pt would benefit from skilled PT services to address his limitations in ROM, strength, mobility and improve quality of life.    Rehab Potential Fair   Clinical Impairments Affecting Rehab Potential psychosocial factors    PT Frequency 2x / week   PT Duration 6 weeks   PT Treatment/Interventions ADLs/Self Care Home Management;Aquatic Therapy;Electrical Stimulation;Moist Heat;Balance training;Therapeutic exercise;Stair training;Gait training;Neuromuscular re-education;Patient/family education;Manual techniques;Passive range of motion   PT Next Visit Plan check 5x sit to stand/TUG;reflexes and sensation; lower trunk rotation, nerve glides, manual/modalities to address lumbar/hip soft tissue restrictions   PT Home Exercise Plan initiated with supine lower trunk rotation   Recommended Other Services counseling services to address suicidal ideations and past attempts to harm himself   Consulted and Agree with Plan of Care Patient  Patient will benefit from skilled therapeutic intervention in order to improve the following deficits and impairments:  Abnormal gait, Decreased mobility, Decreased strength, Impaired sensation, Impaired flexibility, Improper body  mechanics, Postural dysfunction, Pain, Decreased activity tolerance, Increased muscle spasms, Decreased range of motion  Visit Diagnosis: Bilateral low back pain with left-sided sciatica  Muscle weakness (generalized)  Other abnormalities of gait and mobility  Other symptoms and signs involving the musculoskeletal system      G-Codes - 2016/04/15 1544    Functional Assessment Tool Used Clinical judgement based on assessment of ROM, strength, mobility   Functional Limitation Mobility: Walking and moving around   Mobility: Walking and Moving Around Current Status [phone]) At least 60 percent but less than 80 percent impaired, limited or restricted   Mobility: Walking and Moving Around Goal Status [phone]) At least 40 percent but less than 60 percent impaired, limited or restricted       Problem List Patient Active Problem List   Diagnosis Date Noted  . Dysphagia 01/11/2014  . Encounter for screening colonoscopy 01/11/2014  . ETOH abuse 12/01/2013  . Encounter for long-term (current) use of other medications 04/26/2013  . Essential hypertension, benign 04/12/2013  . CHEST PAIN 06/12/2010  . ABDOMINAL PAIN 06/12/2010  . ACUTE SINUSITIS, UNSPECIFIED 11/24/2009  . ALLERGIC RHINITIS 02/14/2009  . Tobacco use disorder 11/15/2008  . ACUTE BRONCHITIS 03/04/2008  . DEPRESSION 12/03/2006  . LOW BACK PAIN 12/03/2006  . HIV DISEASE 11/13/2006  . DRUG ABUSE 11/13/2006  . COPD 11/13/2006  . GERD 11/13/2006   3:58 PM,04/15/16 Marylyn Ishihara PT, DPT [facility] [phone]  [facility] [address] [place], [place], [address] Phone: [phone]   Fax:  [phone]  Name: Shjon Lizarraga Culley MRN: [id number] Date of Birth: 08-Nov-1960

## 2016-04-16 ENCOUNTER — Ambulatory Visit (HOSPITAL_COMMUNITY): Payer: Medicare Other

## 2016-04-23 ENCOUNTER — Ambulatory Visit (HOSPITAL_COMMUNITY): Payer: Medicare Other

## 2016-04-25 ENCOUNTER — Telehealth (HOSPITAL_COMMUNITY): Payer: Self-pay | Admitting: Physical Therapy

## 2016-04-25 ENCOUNTER — Ambulatory Visit (HOSPITAL_COMMUNITY): Payer: Medicare Other | Admitting: Physical Therapy

## 2016-04-25 NOTE — Telephone Encounter (Signed)
LMOM reminding pt of missed apt this evening at 4:45pm. Reminded him of his next apt on 05/02/16 at 4PM.  6:33 PM,04/25/2016 Marylyn IshiharaSara Kiser PT, DPT [facility] [phone]

## 2016-05-02 ENCOUNTER — Telehealth (HOSPITAL_COMMUNITY): Payer: Self-pay | Admitting: Physical Therapy

## 2016-05-02 ENCOUNTER — Ambulatory Visit (HOSPITAL_COMMUNITY): Payer: Medicare Other

## 2016-05-02 NOTE — Telephone Encounter (Signed)
Pt reports he can't make today's apt. Requesting someone email exercises to him to perform at home, but I reminded him that I would need to see him in clinic to know exactly what exercises he needs and to ensure he is performing them correctly. Encouraged him to continue with HEP provided at initial eval and pt agreed. States he will try to make his next apt.   1:45 PM,05/02/2016 Devin IshiharaSara Kiser PT, DPT [facility] [phone]]

## 2016-05-07 ENCOUNTER — Ambulatory Visit (HOSPITAL_COMMUNITY): Payer: Medicare Other | Attending: Family Medicine | Admitting: Physical Therapy

## 2016-05-07 ENCOUNTER — Telehealth (HOSPITAL_COMMUNITY): Payer: Self-pay | Admitting: Physical Therapy

## 2016-05-07 DIAGNOSIS — R2689 Other abnormalities of gait and mobility: Secondary | ICD-10-CM | POA: Insufficient documentation

## 2016-05-07 DIAGNOSIS — M6281 Muscle weakness (generalized): Secondary | ICD-10-CM | POA: Insufficient documentation

## 2016-05-07 DIAGNOSIS — R29898 Other symptoms and signs involving the musculoskeletal system: Secondary | ICD-10-CM | POA: Insufficient documentation

## 2016-05-07 DIAGNOSIS — M5442 Lumbago with sciatica, left side: Secondary | ICD-10-CM | POA: Insufficient documentation

## 2016-05-07 NOTE — Telephone Encounter (Signed)
LMOM reminding pt of missed apt today. Notified of next apt (thurs 05/09/16) at 4pm and no show policy. Reminded him that we will d/c him from PT with next no show. Provided # to call with questions/concerns.  6:07 PM,05/07/2016 Marylyn IshiharaSara Kiser PT, DPT [facility] [phone]

## 2016-05-09 ENCOUNTER — Ambulatory Visit (HOSPITAL_COMMUNITY): Payer: Medicare Other

## 2016-05-09 DIAGNOSIS — M6281 Muscle weakness (generalized): Secondary | ICD-10-CM | POA: Diagnosis present

## 2016-05-09 DIAGNOSIS — R29898 Other symptoms and signs involving the musculoskeletal system: Secondary | ICD-10-CM | POA: Diagnosis present

## 2016-05-09 DIAGNOSIS — M5442 Lumbago with sciatica, left side: Secondary | ICD-10-CM | POA: Diagnosis present

## 2016-05-09 DIAGNOSIS — R2689 Other abnormalities of gait and mobility: Secondary | ICD-10-CM

## 2016-05-09 NOTE — Therapy (Signed)
[facility] [address] [place], [place], [address] Phone: [phone]   Fax:  [phone]  Physical Therapy Treatment  Patient Details  Name: Devin SpareMajor O Lyons MRN: [id number] Date of Birth: 1961-02-16 Referring Provider: John GiovanniStephen Knowlton, MD  Encounter Date: 05/09/2016      PT End of Session - 05/09/16 1610    Visit Number 2   Number of Visits 13   Date for PT Re-Evaluation 05/01/16   Authorization Type medicare   Authorization Time Period 04/10/16 to 05/24/16   PT Start Time 1603   PT Stop Time 1644   PT Time Calculation (min) 41 min   Activity Tolerance Patient limited by pain   Behavior During Therapy Restless      Past Medical History  Diagnosis Date  . HIV infection (HCC)   . Scoliosis 08/24/13  . Prolapsed disk 10/28  . Hypertension   . Chronic back pain   . COPD (chronic obstructive pulmonary disease) [facility](HCC)     Past Surgical History  Procedure Laterality Date  . Knee surgery      right    There were no vitals filed for this visit.      Subjective Assessment - 05/09/16 1607    Subjective Pt reports he continues to have high LBP with radicular symptoms Bil LE Lt > Rt   Pertinent History Scoliosis, HTN, Chronic back pain, prolapsed disc, COPD, R knee surgery   Patient Stated Goals unsure what he will be able to do at PT   Currently in Pain? Yes   Pain Score 6    Pain Location Back   Pain Orientation Lower   Pain Descriptors / Indicators Constant   Pain Radiating Towards Radiates around the ribs; BLE Lt>Rt down to feet burning   Pain Onset More than a month ago   Pain Frequency Constant   Aggravating Factors  anything   Pain Relieving Factors None   Effect of Pain on Daily Activities unable to do alot of ADLs              [facility] Adult PT Treatment/Exercise - 05/09/16 0001    Lumbar Exercises: Stretches   Passive Hamstring Stretch 2 reps;30 seconds   Passive Hamstring Stretch Limitations passive supine    Single Knee to Chest Stretch 2 reps;20 seconds   Lower Trunk Rotation Limitations 20x   Lumbar Exercises: Supine   Other Supine Lumbar Exercises lower trunk rotation x20 each              PT Short Term Goals - 04/10/16 1234    PT SHORT TERM GOAL #1   Title Pt will demo consistency and independence with HEP   Time 3   Period Weeks   Status New   PT SHORT TERM GOAL #2   Title Pt will demo correct log roll technique during transitions sit to/from supine throughout the session   Time 3   Period Weeks   Status New   PT SHORT TERM GOAL #3   Title Pt will report no greater than 7/10 max pain throughout the day to improve tolerance to activity.   Time 3   Period Weeks   Status New           PT Long Term Goals - 04/10/16 1237    PT LONG TERM GOAL #1   Title Pt will demonstrate improved sitting tolerance evident by his ability to sit throughout an entire session without having to stand or reposition himself.  Time 6   Period Weeks   Status New   PT LONG TERM GOAL #2   Title Pt will report no greater than 5/10 pain throughout the day to improve his quality of life.   Time 6   Period Weeks   Status New   PT LONG TERM GOAL #3   Title Pt will demo improved BLE strength to atleast 4/5 to increase safety with functional mobility.   Time 6   Period Weeks   Status New               Plan - 05/09/16 1620    Clinical Impression Statement Reviewed goals, complaince with HEP and assured correct technqiue, copy of eval given to pt.  Pt limited by pain and high muscle guarding with all movements.  Pt stated he unsure of improvements he will make attending therapy.  Pt explained goals with therapy and encouraged to have positive outlook.  Reviewed proper bed mobility with abiltiy to demonstrate though pt stated he has a very high bed and does not get into bed this way, pt encouraged to use step stool by bed to improve bed mobilty.  Gentle back mobilty and stretches complete to  improve LE flexibiltiy.  5 STS (1 min and 14.47" to complete) and TUG 30") complete with increased time to complete due to LBP with increased risk of fall.   Rehab Potential Fair   Clinical Impairments Affecting Rehab Potential psychosocial factors    PT Frequency 2x / week   PT Duration 6 weeks   PT Treatment/Interventions ADLs/Self Care Home Management;Aquatic Therapy;Electrical Stimulation;Moist Heat;Balance training;Therapeutic exercise;Stair training;Gait training;Neuromuscular re-education;Patient/family education;Manual techniques;Passive range of motion   PT Next Visit Plan Next session begin exercises wtih estim and/or MHP to improve core strengthening and LE mobiltiy including lower trunk rotation, nerve glides, manual/modalities to address lumbar/hip soft tissue restrictions   PT Home Exercise Plan reviewed HEP, no additional exercise given this session.        Patient will benefit from skilled therapeutic intervention in order to improve the following deficits and impairments:  Abnormal gait, Decreased mobility, Decreased strength, Impaired sensation, Impaired flexibility, Improper body mechanics, Postural dysfunction, Pain, Decreased activity tolerance, Increased muscle spasms, Decreased range of motion  Visit Diagnosis: Bilateral low back pain with left-sided sciatica  Muscle weakness (generalized)  Other abnormalities of gait and mobility  Other symptoms and signs involving the musculoskeletal system     Problem List Patient Active Problem List   Diagnosis Date Noted  . Dysphagia 01/11/2014  . Encounter for screening colonoscopy 01/11/2014  . ETOH abuse 12/01/2013  . Encounter for long-term (current) use of other medications 04/26/2013  . Essential hypertension, benign 04/12/2013  . CHEST PAIN 06/12/2010  . ABDOMINAL PAIN 06/12/2010  . ACUTE SINUSITIS, UNSPECIFIED 11/24/2009  . ALLERGIC RHINITIS 02/14/2009  . Tobacco use disorder 11/15/2008  . ACUTE BRONCHITIS  03/04/2008  . DEPRESSION 12/03/2006  . LOW BACK PAIN 12/03/2006  . HIV DISEASE 11/13/2006  . DRUG ABUSE 11/13/2006  . COPD 11/13/2006  . GERD 11/13/2006   Becky Sax, LPTA; CBIS [phone]  Juel Burrow 05/09/2016, 5:40 PM  [facility] [address] [place], [place], [address] Phone: [phone]   Fax:  [phone]  Name: Taiquan Campanaro Ghanem MRN: [id number] Date of Birth: July 05, 1961

## 2016-05-14 ENCOUNTER — Ambulatory Visit (HOSPITAL_COMMUNITY): Payer: Medicare Other | Admitting: Physical Therapy

## 2016-05-14 ENCOUNTER — Telehealth (HOSPITAL_COMMUNITY): Payer: Self-pay | Admitting: Physical Therapy

## 2016-05-14 NOTE — Telephone Encounter (Signed)
Pt called requesting to cancel his appointment today and requested Huntley DecSara call him back regarding some symptoms and issues he is having since his last appointment.  Lurena NidaAmy B Deanglo Hissong, PTA/CLT [phone]

## 2016-05-16 ENCOUNTER — Telehealth (HOSPITAL_COMMUNITY): Payer: Self-pay

## 2016-05-16 ENCOUNTER — Ambulatory Visit (HOSPITAL_COMMUNITY): Payer: Medicare Other

## 2016-05-16 NOTE — Telephone Encounter (Signed)
05/16/16 pt called and cx appt

## 2016-05-21 ENCOUNTER — Telehealth (HOSPITAL_COMMUNITY): Payer: Self-pay | Admitting: Physical Therapy

## 2016-05-21 ENCOUNTER — Ambulatory Visit (HOSPITAL_COMMUNITY): Payer: Medicare Other | Admitting: Physical Therapy

## 2016-05-21 NOTE — Telephone Encounter (Signed)
Pt's girlfriend called to notify she just got back from [place] and would be unable to bring Pt to his apt at 4pm. She confirmed his next apt thurs at 4pm.   4:01 PM,05/21/16 Marylyn Ishihara PT, DPT [facility] [phone]

## 2016-05-23 ENCOUNTER — Ambulatory Visit (HOSPITAL_COMMUNITY): Payer: Medicare Other

## 2016-05-23 DIAGNOSIS — M6281 Muscle weakness (generalized): Secondary | ICD-10-CM

## 2016-05-23 DIAGNOSIS — R29898 Other symptoms and signs involving the musculoskeletal system: Secondary | ICD-10-CM

## 2016-05-23 DIAGNOSIS — R2689 Other abnormalities of gait and mobility: Secondary | ICD-10-CM

## 2016-05-23 DIAGNOSIS — M5442 Lumbago with sciatica, left side: Secondary | ICD-10-CM | POA: Diagnosis not present

## 2016-05-23 NOTE — Therapy (Addendum)
[place], [place], [address] Phone: [phone]   Fax:  [phone]  Physical Therapy Treatment/Discharge  Patient Details  Name: Devin [name] MRN: [id number] Date of Birth: [birth year] Referring Provider: Leslie Andrea, MD  Encounter Date: 05/23/2016      PT End of Session - 05/23/16 1711    Visit Number 3   Number of Visits 13   Date for PT Re-Evaluation 05/01/16   Authorization Type medicare   Authorization Time Period 04/10/16 to 05/24/16   PT Start Time 1613   PT Stop Time 1705   PT Time Calculation (min) 52 min   Activity Tolerance Patient limited by pain   Behavior During Therapy Restless;Agitated      Past Medical History:  Diagnosis Date  . Chronic back pain   . COPD (chronic obstructive pulmonary disease) ([place])   . HIV infection ([place])   . Hypertension   . Prolapsed disk 10/28  . Scoliosis 08/24/13    Past Surgical History:  Procedure Laterality Date  . KNEE SURGERY     right    There were no vitals filed for this visit.      Subjective Assessment - 05/23/16 1619    Subjective Pt stated he has very high pain scale on mid back feeling like muscles are pulling ribs to the spine Rt > Lt, pain scale 8/10.  Pt c/o high BP today and headaches   Pertinent History Scoliosis, HTN, Chronic back pain, prolapsed disc, COPD, R knee surgery   Patient Stated Goals unsure what he will be able to do at PT   Currently in Pain? Yes   Pain Score 8    Pain Location Back   Pain Orientation Mid   Pain Descriptors / Indicators Constant   Pain Type Chronic pain   Pain Radiating Towards Radiates around the ribs; BLE Lt>Rt down to feet burning feeling like a lot of pressure   Pain Onset More than a month ago   Pain Frequency Constant   Aggravating Factors  anything   Pain Relieving Factors None   Effect of Pain on Daily Activities unable to do ADLs                         [facility] Adult  PT Treatment/Exercise - 05/23/16 0001      Lumbar Exercises: Supine   Ab Set 10 reps;5 seconds   Other Supine Lumbar Exercises attempted LTR; pt declined due to pain   Other Supine Lumbar Exercises Supine nerve glides to Lt LE     Modalities   Modalities Electrical Stimulation     Electrical Stimulation   Electrical Stimulation Location Mid back   Electrical Stimulation Action IFES for pain   Electrical Stimulation Parameters IFES per pt tolerance 7.8-8.5   Electrical Stimulation Goals Pain                PT Education - 05/23/16 1726    Education provided Yes   Education Details Educated benefits of completing HEP more frequency; importance of avoiding sedentary lifestyle to prevent further worsening of LBP; offered information regarding counseling to address suicidal ideations and to help cope with poor quality of life   Person(s) Educated Patient   Methods Explanation;Demonstration   Comprehension Verbalized understanding;Returned demonstration;Verbal cues required;Tactile cues required;Need further instruction          PT Short Term Goals - 04/10/16 1234      PT  SHORT TERM GOAL #1   Title Pt will demo consistency and independence with HEP   Time 3   Period Weeks   Status New     PT SHORT TERM GOAL #2   Title Pt will demo correct log roll technique during transitions sit to/from supine throughout the session   Time 3   Period Weeks   Status New     PT SHORT TERM GOAL #3   Title Pt will report no greater than 7/10 max pain throughout the day to improve tolerance to activity.   Time 3   Period Weeks   Status New           PT Long Term Goals - 04/10/16 1237      PT LONG TERM GOAL #1   Title Pt will demonstrate improved sitting tolerance evident by his ability to sit throughout an entire session without having to stand or reposition himself.   Time 6   Period Weeks   Status New     PT LONG TERM GOAL #2   Title Pt will report no greater than 5/10  pain throughout the day to improve his quality of life.   Time 6   Period Weeks   Status New     PT LONG TERM GOAL #3   Title Pt will demo improved BLE strength to atleast 4/5 to increase safety with functional mobility.   Time 6   Period Weeks   Status New               Plan - 05/23/16 1713    Clinical Impression Statement Pt limited by pain through session and reports of high BP (162/97 mmHg initially this session) and headaches.  Trial with interferential estim for pain control during therex.  Pt required explaination of benefits with all exercises and therapist facilitation for proper muscle activation.  Trial with nerve glides for pain control.  Pt stated through session that he does not feel PT will assist with pain control.  Pt has had 3 sessions over 6 weeks and noncompliance with HEP.  Pt educated on purpose of exericses and benefits of completing more frequently.  EOS no reports of pain relief with manual nerve glides or estim.     Rehab Potential Fair   Clinical Impairments Affecting Rehab Potential psychosocial factors    PT Frequency 2x / week   PT Duration 6 weeks   PT Treatment/Interventions ADLs/Self Care Home Management;Aquatic Therapy;Electrical Stimulation;Moist Heat;Balance training;Therapeutic exercise;Stair training;Gait training;Neuromuscular re-education;Patient/family education;Manual techniques;Passive range of motion   PT Next Visit Plan Next session begin exercises wtih estim and/or MHP to improve core strengthening and LE mobiltiy including lower trunk rotation, nerve glides, manual/modalities to address lumbar/hip soft tissue restrictions   PT Home Exercise Plan reviewed HEP, no additional exercise given this session.        Patient will benefit from skilled therapeutic intervention in order to improve the following deficits and impairments:  Abnormal gait, Decreased mobility, Decreased strength, Impaired sensation, Impaired flexibility, Improper body  mechanics, Postural dysfunction, Pain, Decreased activity tolerance, Increased muscle spasms, Decreased range of motion  Visit Diagnosis: Bilateral low back pain with left-sided sciatica  Muscle weakness (generalized)  Other abnormalities of gait and mobility  Other symptoms and signs involving the musculoskeletal system     Problem List Patient Active Problem List   Diagnosis Date Noted  . Dysphagia 01/11/2014  . Encounter for screening colonoscopy 01/11/2014  . ETOH abuse 12/01/2013  . Encounter for long-term (  current) use of other medications 04/26/2013  . Essential hypertension, benign 04/12/2013  . CHEST PAIN 06/12/2010  . ABDOMINAL PAIN 06/12/2010  . ACUTE SINUSITIS, UNSPECIFIED 11/24/2009  . ALLERGIC RHINITIS 02/14/2009  . Tobacco use disorder 11/15/2008  . ACUTE BRONCHITIS 03/04/2008  . DEPRESSION 12/03/2006  . LOW BACK PAIN 12/03/2006  . HIV DISEASE 11/13/2006  . DRUG ABUSE 11/13/2006  . COPD 11/13/2006  . GERD 11/13/2006   Ihor Austin, [place]; [place]  Aldona Lento 05/23/2016, 5:30 PM  [place], [place], [address] Phone: [phone]   Fax:  [phone]  Name: Devin [name] MRN: [id number] Date of Birth: 06/08/1961  *Addendum to resolve episode of care and d/c pt from [place]  Visits from Start of Care: 3  Current functional level related to goals / functional outcomes: See above for more details    Remaining deficits: See above for more details    Education / Equipment: See above for more details   Plan: Patient agrees to discharge.  Patient goals were not met. Patient is being discharged due to not returning since the last visit.  ?????         3:02 PM,08/31/18 [place], [place] at [place]

## 2016-05-28 ENCOUNTER — Ambulatory Visit (HOSPITAL_COMMUNITY): Payer: Medicare Other | Attending: Family Medicine

## 2016-05-30 ENCOUNTER — Encounter (HOSPITAL_COMMUNITY): Payer: Medicare Other | Admitting: Physical Therapy

## 2016-05-30 ENCOUNTER — Telehealth (HOSPITAL_COMMUNITY): Payer: Self-pay

## 2016-06-04 ENCOUNTER — Telehealth (HOSPITAL_COMMUNITY): Payer: Self-pay | Admitting: Physical Therapy

## 2016-06-04 NOTE — Telephone Encounter (Signed)
Daughter called to say MD states patient should D/C from PT at this time.

## 2016-06-05 ENCOUNTER — Ambulatory Visit (HOSPITAL_COMMUNITY): Payer: Medicare Other

## 2016-07-18 ENCOUNTER — Other Ambulatory Visit: Payer: Self-pay | Admitting: Neurosurgery

## 2016-07-18 DIAGNOSIS — M419 Scoliosis, unspecified: Secondary | ICD-10-CM

## 2016-07-26 ENCOUNTER — Telehealth: Payer: Self-pay | Admitting: *Deleted

## 2016-07-26 ENCOUNTER — Ambulatory Visit
Admission: RE | Admit: 2016-07-26 | Discharge: 2016-07-26 | Disposition: A | Discharge status: Home / Self Care | Payer: Medicare Other | Source: Ambulatory Visit | Attending: Neurosurgery | Admitting: Neurosurgery

## 2016-07-26 DIAGNOSIS — M419 Scoliosis, unspecified: Secondary | ICD-10-CM

## 2016-07-26 NOTE — Telephone Encounter (Signed)
Patient called 4:00 pm Friday afternoon asking "is Dr. Ninetta LightsHatcher in? Can I come over and give blood and see the doctor? I'm in town." RN advised patient that the clinic was closed Friday afternoons, no labs could be drawn, MD unavailable.  Patient last seen in office 11/2013.  Patient states he has been away so long "dealing with my back and other issues."  He states transportation and gas are issues for him, asked to make an appointment. First available for Dr. Ninetta LightsHatcher is 10/25. Patient will take that appointment, will get labs the same day due to transportation issues.  Demographics updated. Andree CossHowell, Michelle M, RN

## 2016-08-21 ENCOUNTER — Ambulatory Visit (INDEPENDENT_AMBULATORY_CARE_PROVIDER_SITE_OTHER): Payer: Medicare Other | Admitting: Infectious Diseases

## 2016-08-21 ENCOUNTER — Encounter: Payer: Self-pay | Admitting: Infectious Diseases

## 2016-08-21 ENCOUNTER — Ambulatory Visit: Payer: Medicare Other | Admitting: *Deleted

## 2016-08-21 VITALS — BP 142/102 | HR 89 | Temp 97.5°F | Wt 201.0 lb

## 2016-08-21 DIAGNOSIS — F17200 Nicotine dependence, unspecified, uncomplicated: Secondary | ICD-10-CM

## 2016-08-21 DIAGNOSIS — I10 Essential (primary) hypertension: Secondary | ICD-10-CM | POA: Diagnosis not present

## 2016-08-21 DIAGNOSIS — Z789 Other specified health status: Secondary | ICD-10-CM

## 2016-08-21 DIAGNOSIS — B20 Human immunodeficiency virus [HIV] disease: Secondary | ICD-10-CM

## 2016-08-21 DIAGNOSIS — F411 Generalized anxiety disorder: Secondary | ICD-10-CM

## 2016-08-21 DIAGNOSIS — Z79899 Other long term (current) drug therapy: Secondary | ICD-10-CM

## 2016-08-21 DIAGNOSIS — F1010 Alcohol abuse, uncomplicated: Secondary | ICD-10-CM

## 2016-08-21 DIAGNOSIS — Z113 Encounter for screening for infections with a predominantly sexual mode of transmission: Secondary | ICD-10-CM

## 2016-08-21 LAB — LIPID PANEL
Cholesterol: 203 mg/dL — ABNORMAL HIGH (ref 125–200)
HDL: 30 mg/dL — ABNORMAL LOW (ref >=40)
LDL CALC: 115 mg/dL (ref <130)
Total CHOL/HDL Ratio: 6.8 Ratio — ABNORMAL HIGH (ref <=5.0)
Triglycerides: 292 mg/dL — ABNORMAL HIGH (ref <150)
VLDL: 58 mg/dL — ABNORMAL HIGH (ref <30)

## 2016-08-21 LAB — CBC
HCT: 49.1 % (ref 38.5–50.0)
HEMOGLOBIN: 16.9 g/dL (ref 13.2–17.1)
MCH: 32.9 pg (ref 27.0–33.0)
MCHC: 34.4 g/dL (ref 32.0–36.0)
MCV: 95.7 fL (ref 80.0–100.0)
MPV: 9.5 fL (ref 7.5–12.5)
PLATELETS: 220 10*3/uL (ref 140–400)
RBC: 5.13 MIL/uL (ref 4.20–5.80)
RDW: 13.5 % (ref 11.0–15.0)
WBC: 8.3 10*3/uL (ref 3.8–10.8)

## 2016-08-21 LAB — COMPLETE METABOLIC PANEL WITH GFR
ALBUMIN: 4.1 g/dL (ref 3.6–5.1)
ALK PHOS: 84 U/L (ref 40–115)
ALT: 20 U/L (ref 9–46)
AST: 31 U/L (ref 10–35)
BILIRUBIN TOTAL: 0.4 mg/dL (ref 0.2–1.2)
BUN: 9 mg/dL (ref 7–25)
CO2: 24 mmol/L (ref 20–31)
Calcium: 9.7 mg/dL (ref 8.6–10.3)
Chloride: 104 mmol/L (ref 98–110)
Creat: 1.1 mg/dL (ref 0.70–1.33)
GFR, Est African American: 88 mL/min (ref >=60)
GFR, Est Non African American: 76 mL/min (ref >=60)
GLUCOSE: 103 mg/dL — AB (ref 65–99)
Potassium: 4.1 mmol/L (ref 3.5–5.3)
SODIUM: 138 mmol/L (ref 135–146)
TOTAL PROTEIN: 7 g/dL (ref 6.1–8.1)

## 2016-08-21 MED ORDER — ELVITEG-COBIC-EMTRICIT-TENOFAF 150-150-200-10 MG PO TABS: 1.0000 | ORAL_TABLET | Freq: Every day | ORAL | 3 refills | Status: DC

## 2016-08-21 NOTE — Assessment & Plan Note (Signed)
Will change him to genvoya.  Will see him back in 3-4 months.  Offered/refused condoms. Girlfriend is (-) and does not want to use condoms. She gets tested regularly. Offered that she come here.  has gotten flu shot

## 2016-08-21 NOTE — Progress Notes (Signed)
   Subjective:    Patient ID: Devin [name], male    DOB: 1961-07-10, 55 y.o.   MRN: [id number]  HPI 55 yo M with HIV+ dx 1995, and hx of COPD. Has been on (prev took CBV/EFV). Still taking care of his father with health issues.  Continues to have intermittent back pain- has had f/u with rehab/PT. Has seen neurosurgery. 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  MRI 07-26-16: IMPRESSION: MR THORACIC SPINE IMPRESSION 1. Stable thoracic spine since 2016. Dextro convex scoliosis with mid and lower thoracic disc, endplate, and posterior element degeneration. 2. No thoracic spinal stenosis. Moderate multifactorial left T8 foraminal stenosis is stable. 3. Small volume retained or aspirated secretions in the trachea. Query recent cough or respiratory infection.  MR LUMBAR SPINE IMPRESSION 1. Stable lumbar spine since 2016. Levoconvex upper lumbar scoliosis. Mild grade 1 anterolisthesis at L5-S1 with chronic L5 pars fractures. 2. Small caudal disc herniation on the left at L4-L5 with up to a moderate left lateral recess stenosis at the level of the descending L5 nerve roots has not significantly changed. 3. Up to moderate left L4 and bilateral L5 neural foraminal stenosis is stable.  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 States he was told he would have rods, then plate in his back to help with this. Sought local opinion afterwards. Is to get local injection 08-23-16.   HIV 1 RNA Quant (copies/mL)  Date Value  12/01/2013 <20  04/12/2013 <20  10/05/2012 <20   CD4 T Cell Abs  Date Value  12/01/2013 870 /uL  04/12/2013 750 cmm  10/05/2012 550 cmm    Review of Systems  Constitutional: Negative for appetite change and unexpected weight change.  Gastrointestinal: Negative for constipation and diarrhea.  Genitourinary: Negative for difficulty urinating.  Musculoskeletal: Positive for back pain.      Objective:   Physical Exam    Constitutional: He appears well-developed and well-nourished.  HENT:  Mouth/Throat: No oropharyngeal exudate.  Eyes: EOM are normal. Pupils are equal, round, and reactive to light.  Neck: Neck supple.  Cardiovascular: Normal rate, regular rhythm and normal heart sounds.   Pulmonary/Chest: Breath sounds normal.  Abdominal: Soft. Bowel sounds are normal. There is no tenderness.  Musculoskeletal: He exhibits no edema.  Lymphadenopathy:    He has no cervical adenopathy.          Assessment & Plan:

## 2016-08-21 NOTE — Addendum Note (Signed)
Addended byJimmy Picket: ABBITT, KATRINA F on: 08/21/2016 04:15 PM   Modules accepted: Orders

## 2016-08-21 NOTE — Assessment & Plan Note (Addendum)
States he has been subs free.  Offered for him to see jodi, he defers.

## 2016-08-21 NOTE — Assessment & Plan Note (Signed)
Will get him in with PCP.  

## 2016-08-21 NOTE — Assessment & Plan Note (Signed)
Encouraged to quit. 

## 2016-08-21 NOTE — BH Specialist Note (Signed)
Counselor met with Devin [name] in the exam room for a warm hand off.  Patient was oriented times four with good affect and dress.  Patient was alert and talkative. Patient mostly discussed his back problems and how it had so affected his life lately.  Patient said that he is not drinking and has not drank in almost a year. Patient shared that he was really feeling down lately not only from pain but because he cannot do the things he use to physically.  Counselor provided support and encouragement for patient today and recommended that he make an appointment to process further what is going on in his life right now and learn coping skills that will allow for him to better deal with his daily struggles.   Rolena Infante, MA, LPC [facility]

## 2016-08-22 LAB — T-HELPER CELL (CD4) - (RCID CLINIC ONLY)
CD4 % Helper T Cell: 36 % (ref 33–55)
CD4 T CELL ABS: 1110 /uL (ref 400–2700)

## 2016-08-22 LAB — RPR

## 2016-08-23 LAB — HIV-1 RNA QUANT-NO REFLEX-BLD: HIV-1 RNA Quant, Log: <1.3 Log copies/mL (ref <1.30)

## 2016-12-03 ENCOUNTER — Ambulatory Visit (HOSPITAL_COMMUNITY)
Admission: RE | Admit: 2016-12-03 | Discharge: 2016-12-03 | Disposition: A | Discharge status: Home / Self Care | Payer: Medicare Other | Source: Ambulatory Visit | Attending: Family Medicine | Admitting: Family Medicine

## 2016-12-03 ENCOUNTER — Other Ambulatory Visit (HOSPITAL_COMMUNITY): Payer: Self-pay | Admitting: Family Medicine

## 2016-12-03 DIAGNOSIS — R0602 Shortness of breath: Secondary | ICD-10-CM | POA: Insufficient documentation

## 2016-12-03 DIAGNOSIS — I700 Atherosclerosis of aorta: Secondary | ICD-10-CM | POA: Diagnosis not present

## 2017-02-06 ENCOUNTER — Ambulatory Visit: Payer: Medicare Other | Admitting: Infectious Diseases

## 2017-02-20 ENCOUNTER — Encounter (HOSPITAL_COMMUNITY): Payer: Self-pay | Admitting: Emergency Medicine

## 2017-02-20 ENCOUNTER — Observation Stay (HOSPITAL_BASED_OUTPATIENT_CLINIC_OR_DEPARTMENT_OTHER)
Admit: 2017-02-20 | Discharge: 2017-02-20 | Disposition: A | Discharge status: Home / Self Care | Payer: Medicare Other | Attending: Physician Assistant | Admitting: Physician Assistant

## 2017-02-20 ENCOUNTER — Emergency Department (HOSPITAL_COMMUNITY): Payer: Medicare Other

## 2017-02-20 ENCOUNTER — Inpatient Hospital Stay (HOSPITAL_COMMUNITY)
Admission: EM | Admit: 2017-02-20 | Discharge: 2017-02-22 | DRG: 175 | Disposition: A | Discharge status: Home / Self Care | Payer: Medicare Other | Attending: Internal Medicine | Admitting: Internal Medicine

## 2017-02-20 DIAGNOSIS — G629 Polyneuropathy, unspecified: Secondary | ICD-10-CM | POA: Diagnosis present

## 2017-02-20 DIAGNOSIS — I10 Essential (primary) hypertension: Secondary | ICD-10-CM | POA: Diagnosis not present

## 2017-02-20 DIAGNOSIS — I2699 Other pulmonary embolism without acute cor pulmonale: Secondary | ICD-10-CM | POA: Diagnosis not present

## 2017-02-20 DIAGNOSIS — F329 Major depressive disorder, single episode, unspecified: Secondary | ICD-10-CM | POA: Diagnosis present

## 2017-02-20 DIAGNOSIS — M545 Low back pain, unspecified: Secondary | ICD-10-CM | POA: Diagnosis present

## 2017-02-20 DIAGNOSIS — Z86711 Personal history of pulmonary embolism: Secondary | ICD-10-CM | POA: Diagnosis present

## 2017-02-20 DIAGNOSIS — J449 Chronic obstructive pulmonary disease, unspecified: Secondary | ICD-10-CM | POA: Diagnosis present

## 2017-02-20 DIAGNOSIS — Z720 Tobacco use: Secondary | ICD-10-CM | POA: Diagnosis present

## 2017-02-20 DIAGNOSIS — R739 Hyperglycemia, unspecified: Secondary | ICD-10-CM | POA: Diagnosis present

## 2017-02-20 DIAGNOSIS — F17210 Nicotine dependence, cigarettes, uncomplicated: Secondary | ICD-10-CM | POA: Diagnosis present

## 2017-02-20 DIAGNOSIS — R000 Tachycardia, unspecified: Secondary | ICD-10-CM | POA: Diagnosis present

## 2017-02-20 DIAGNOSIS — M419 Scoliosis, unspecified: Secondary | ICD-10-CM | POA: Diagnosis present

## 2017-02-20 DIAGNOSIS — F419 Anxiety disorder, unspecified: Secondary | ICD-10-CM | POA: Diagnosis present

## 2017-02-20 DIAGNOSIS — B20 Human immunodeficiency virus [HIV] disease: Secondary | ICD-10-CM | POA: Diagnosis present

## 2017-02-20 DIAGNOSIS — R079 Chest pain, unspecified: Secondary | ICD-10-CM | POA: Diagnosis not present

## 2017-02-20 DIAGNOSIS — N179 Acute kidney failure, unspecified: Secondary | ICD-10-CM | POA: Diagnosis present

## 2017-02-20 DIAGNOSIS — Z79899 Other long term (current) drug therapy: Secondary | ICD-10-CM

## 2017-02-20 DIAGNOSIS — K219 Gastro-esophageal reflux disease without esophagitis: Secondary | ICD-10-CM | POA: Diagnosis present

## 2017-02-20 DIAGNOSIS — M479 Spondylosis, unspecified: Secondary | ICD-10-CM | POA: Diagnosis present

## 2017-02-20 DIAGNOSIS — Z833 Family history of diabetes mellitus: Secondary | ICD-10-CM

## 2017-02-20 DIAGNOSIS — D72829 Elevated white blood cell count, unspecified: Secondary | ICD-10-CM | POA: Diagnosis present

## 2017-02-20 DIAGNOSIS — G8929 Other chronic pain: Secondary | ICD-10-CM | POA: Diagnosis present

## 2017-02-20 HISTORY — DX: Unspecified osteoarthritis, unspecified site: M19.90

## 2017-02-20 HISTORY — DX: Unspecified asthma, uncomplicated: J45.909

## 2017-02-20 HISTORY — DX: Gastro-esophageal reflux disease without esophagitis: K21.9

## 2017-02-20 HISTORY — DX: Other pulmonary embolism without acute cor pulmonale: I26.99

## 2017-02-20 HISTORY — DX: Depression, unspecified: F32.A

## 2017-02-20 HISTORY — DX: Unspecified chronic bronchitis: J42

## 2017-02-20 HISTORY — DX: Other complications of anesthesia, initial encounter: T88.59XA

## 2017-02-20 HISTORY — DX: Personal history of other diseases of the digestive system: Z87.19

## 2017-02-20 HISTORY — DX: Pneumonia, unspecified organism: J18.9

## 2017-02-20 HISTORY — DX: Major depressive disorder, single episode, unspecified: F32.9

## 2017-02-20 HISTORY — DX: Adverse effect of unspecified anesthetic, initial encounter: T41.45XA

## 2017-02-20 HISTORY — DX: Anxiety disorder, unspecified: F41.9

## 2017-02-20 HISTORY — DX: Personal history of peptic ulcer disease: Z87.11

## 2017-02-20 LAB — BASIC METABOLIC PANEL
ANION GAP: 10 (ref 5–15)
BUN: 8 mg/dL (ref 6–20)
CO2: 23 mmol/L (ref 22–32)
Calcium: 9.4 mg/dL (ref 8.9–10.3)
Chloride: 102 mmol/L (ref 101–111)
Creatinine, Ser: 1.2 mg/dL (ref 0.61–1.24)
GFR calc Af Amer: >60 mL/min (ref >60)
GFR calc non Af Amer: >60 mL/min (ref >60)
GLUCOSE: 156 mg/dL — AB (ref 65–99)
POTASSIUM: 3.7 mmol/L (ref 3.5–5.1)
Sodium: 135 mmol/L (ref 135–145)

## 2017-02-20 LAB — I-STAT TROPONIN, ED: Troponin i, poc: 0 ng/mL (ref 0.00–0.08)

## 2017-02-20 LAB — HEPARIN LEVEL (UNFRACTIONATED)
Heparin Unfractionated: 0.19 IU/mL — ABNORMAL LOW (ref 0.30–0.70)
Heparin Unfractionated: 0.31 IU/mL (ref 0.30–0.70)

## 2017-02-20 LAB — CBC
HEMATOCRIT: 43.6 % (ref 39.0–52.0)
HEMOGLOBIN: 15.1 g/dL (ref 13.0–17.0)
MCH: 33.6 pg (ref 26.0–34.0)
MCHC: 34.6 g/dL (ref 30.0–36.0)
MCV: 97.1 fL (ref 78.0–100.0)
Platelets: 223 10*3/uL (ref 150–400)
RBC: 4.49 MIL/uL (ref 4.22–5.81)
RDW: 11.8 % (ref 11.5–15.5)
WBC: 11.5 10*3/uL — ABNORMAL HIGH (ref 4.0–10.5)

## 2017-02-20 MED ORDER — HEPARIN (PORCINE) IN NACL 100-0.45 UNIT/ML-% IJ SOLN
1850.0000 [IU]/h | INTRAMUSCULAR | Status: DC
  Administered 2017-02-20: 1500 [IU]/h via INTRAVENOUS
  Administered 2017-02-20: 1850 [IU]/h via INTRAVENOUS
  Filled 2017-02-20 (×3): qty 250

## 2017-02-20 MED ORDER — OXYCODONE-ACETAMINOPHEN 5-325 MG PO TABS
1.0000 | ORAL_TABLET | ORAL | Status: DC | PRN
  Administered 2017-02-20 – 2017-02-22 (×6): 1 via ORAL
  Filled 2017-02-20 (×6): qty 1

## 2017-02-20 MED ORDER — GABAPENTIN 600 MG PO TABS
600.0000 mg | ORAL_TABLET | ORAL | Status: DC | PRN
  Filled 2017-02-20: qty 1

## 2017-02-20 MED ORDER — IPRATROPIUM-ALBUTEROL 0.5-2.5 (3) MG/3ML IN SOLN: 3.0000 mL | RESPIRATORY_TRACT | Status: DC | PRN

## 2017-02-20 MED ORDER — ACETAMINOPHEN 325 MG PO TABS: 650.0000 mg | ORAL_TABLET | Freq: Four times a day (QID) | ORAL | Status: DC | PRN

## 2017-02-20 MED ORDER — SENNOSIDES-DOCUSATE SODIUM 8.6-50 MG PO TABS
1.0000 | ORAL_TABLET | Freq: Every evening | ORAL | Status: DC | PRN
  Filled 2017-02-20: qty 1

## 2017-02-20 MED ORDER — BISACODYL 10 MG RE SUPP: 10.0000 mg | Freq: Every day | RECTAL | Status: DC | PRN

## 2017-02-20 MED ORDER — LISINOPRIL 10 MG PO TABS
20.0000 mg | ORAL_TABLET | Freq: Two times a day (BID) | ORAL | Status: DC
  Administered 2017-02-20 – 2017-02-21 (×2): 20 mg via ORAL
  Filled 2017-02-20: qty 2

## 2017-02-20 MED ORDER — KETOROLAC TROMETHAMINE 30 MG/ML IJ SOLN
30.0000 mg | Freq: Once | INTRAMUSCULAR | Status: AC
  Administered 2017-02-20: 30 mg via INTRAVENOUS
  Filled 2017-02-20: qty 1

## 2017-02-20 MED ORDER — AMITRIPTYLINE HCL 50 MG PO TABS
50.0000 mg | ORAL_TABLET | Freq: Every evening | ORAL | Status: DC | PRN
  Administered 2017-02-21: 50 mg via ORAL
  Filled 2017-02-20: qty 1

## 2017-02-20 MED ORDER — DIAZEPAM 5 MG PO TABS
10.0000 mg | ORAL_TABLET | Freq: Three times a day (TID) | ORAL | Status: DC | PRN
  Administered 2017-02-20 – 2017-02-22 (×4): 10 mg via ORAL
  Filled 2017-02-20 (×4): qty 2

## 2017-02-20 MED ORDER — CYCLOBENZAPRINE HCL 10 MG PO TABS
10.0000 mg | ORAL_TABLET | Freq: Three times a day (TID) | ORAL | Status: DC | PRN
  Administered 2017-02-20: 10 mg via ORAL
  Filled 2017-02-20: qty 1

## 2017-02-20 MED ORDER — ALBUTEROL SULFATE (2.5 MG/3ML) 0.083% IN NEBU: 2.5000 mg | INHALATION_SOLUTION | Freq: Four times a day (QID) | RESPIRATORY_TRACT | Status: DC | PRN

## 2017-02-20 MED ORDER — TORSEMIDE 20 MG PO TABS
20.0000 mg | ORAL_TABLET | Freq: Every day | ORAL | Status: DC
  Administered 2017-02-21: 20 mg via ORAL
  Filled 2017-02-20: qty 1

## 2017-02-20 MED ORDER — SODIUM CHLORIDE 0.9% FLUSH
3.0000 mL | Freq: Two times a day (BID) | INTRAVENOUS | Status: DC
  Administered 2017-02-20 (×2): 3 mL via INTRAVENOUS

## 2017-02-20 MED ORDER — IOPAMIDOL (ISOVUE-300) INJECTION 61%
INTRAVENOUS | Status: AC
  Filled 2017-02-20: qty 100

## 2017-02-20 MED ORDER — HEPARIN (PORCINE) IN NACL 100-0.45 UNIT/ML-% IJ SOLN
2050.0000 [IU]/h | INTRAMUSCULAR | Status: DC
  Administered 2017-02-21: 1950 [IU]/h via INTRAVENOUS
  Filled 2017-02-20 (×2): qty 250

## 2017-02-20 MED ORDER — IBUPROFEN 400 MG PO TABS: 600.0000 mg | ORAL_TABLET | Freq: Three times a day (TID) | ORAL | Status: DC | PRN

## 2017-02-20 MED ORDER — ACETAMINOPHEN 650 MG RE SUPP: 650.0000 mg | Freq: Four times a day (QID) | RECTAL | Status: DC | PRN

## 2017-02-20 MED ORDER — KETOROLAC TROMETHAMINE 15 MG/ML IJ SOLN
15.0000 mg | Freq: Four times a day (QID) | INTRAMUSCULAR | Status: DC | PRN
  Administered 2017-02-21: 15 mg via INTRAVENOUS
  Filled 2017-02-20: qty 1

## 2017-02-20 MED ORDER — PANTOPRAZOLE SODIUM 40 MG PO TBEC
40.0000 mg | DELAYED_RELEASE_TABLET | Freq: Every day | ORAL | Status: DC
  Administered 2017-02-20 – 2017-02-22 (×3): 40 mg via ORAL
  Filled 2017-02-20 (×3): qty 1

## 2017-02-20 MED ORDER — HEPARIN BOLUS VIA INFUSION
2500.0000 [IU] | Freq: Once | INTRAVENOUS | Status: AC
  Administered 2017-02-20: 2500 [IU] via INTRAVENOUS
  Filled 2017-02-20: qty 2500

## 2017-02-20 MED ORDER — ELVITEG-COBIC-EMTRICIT-TENOFAF 150-150-200-10 MG PO TABS
1.0000 | ORAL_TABLET | Freq: Every day | ORAL | Status: DC
  Administered 2017-02-20 – 2017-02-22 (×3): 1 via ORAL
  Filled 2017-02-20 (×3): qty 1

## 2017-02-20 MED ORDER — ALBUTEROL SULFATE HFA 108 (90 BASE) MCG/ACT IN AERS: 2.0000 | INHALATION_SPRAY | RESPIRATORY_TRACT | Status: DC | PRN

## 2017-02-20 MED ORDER — MORPHINE SULFATE (PF) 4 MG/ML IV SOLN
4.0000 mg | Freq: Once | INTRAVENOUS | Status: AC
Start: 2017-02-20 — End: 2017-02-20
  Administered 2017-02-20: 4 mg via INTRAVENOUS
  Filled 2017-02-20: qty 1

## 2017-02-20 MED ORDER — HYDROCODONE-ACETAMINOPHEN 10-325 MG PO TABS
2.0000 | ORAL_TABLET | ORAL | Status: DC | PRN
  Administered 2017-02-20 – 2017-02-22 (×9): 2 via ORAL
  Filled 2017-02-20 (×9): qty 2

## 2017-02-20 MED ORDER — IOPAMIDOL (ISOVUE-370) INJECTION 76%
INTRAVENOUS | Status: AC
  Administered 2017-02-20: 100 mL
  Filled 2017-02-20: qty 100

## 2017-02-20 MED ORDER — ONDANSETRON HCL 4 MG/2ML IJ SOLN: 4.0000 mg | Freq: Four times a day (QID) | INTRAMUSCULAR | Status: DC | PRN

## 2017-02-20 MED ORDER — ONDANSETRON HCL 4 MG PO TABS: 4.0000 mg | ORAL_TABLET | Freq: Four times a day (QID) | ORAL | Status: DC | PRN

## 2017-02-20 MED ORDER — HEPARIN BOLUS VIA INFUSION
6000.0000 [IU] | Freq: Once | INTRAVENOUS | Status: AC
  Administered 2017-02-20: 6000 [IU] via INTRAVENOUS
  Filled 2017-02-20: qty 6000

## 2017-02-20 NOTE — Progress Notes (Signed)
Preliminary results by tech - Lower Ext. Venous Duplex Completed. Right leg, positive for subacute DVT in the popliteal vein and posterior tibial and peroneal veins. Left leg, negative for deep and superficial vein thrombosis.  Results given to patient's nurse, Millie. Marilynne Halsted, BS, RDMS, RVT

## 2017-02-20 NOTE — Progress Notes (Signed)
ANTICOAGULATION CONSULT NOTE  Pharmacy Consult for Heparin  Indication: pulmonary embolus  No Known Allergies  Patient Measurements: ~90 kg  Vital Signs: Temp: 98 F (36.7 C) (04/26 1331) Temp Source: Oral (04/26 1331) BP: 125/70 (04/26 1331) Pulse Rate: 111 (04/26 1331)  Labs:  Recent Labs  02/20/17 0130 02/20/17 1353  HGB 15.1  --   HCT 43.6  --   PLT 223  --   HEPARINUNFRC  --  0.19*  CREATININE 1.20  --     Medical History: Past Medical History:  Diagnosis Date  . Anxiety   . Arthritis    "I'm eat up w/it" (02/20/2017)  . Asthma   . Chronic back pain    "the whole back" (02/20/2017)  . Chronic bronchitis (HCC)   . Complication of anesthesia    "felt like I couldn't breath coming out of it"  . COPD (chronic obstructive pulmonary disease) (HCC)   . Depression   . GERD (gastroesophageal reflux disease)   . History of hiatal hernia   . History of stomach ulcers    "bleeding ones; I was young then"  . HIV infection (HCC) dx'd ~ 1999  . Hypertension   . Pneumonia    "several times" (02/20/2017)  . Prolapsed disk 10/28  . Pulmonary embolism (HCC) 02/20/2017  . Scoliosis 08/24/13   Assessment: 56 y/o M continuing on heparin per Pharmacy for new extensive bilateral PE, no RHS. Also with RLE DVT on dopplers. No anticoagulation PTA. CBC wnl.   Initial heparin level low at 0.19. No bleed or IV line issues and drip has not been turned off per RN. Likely to transition to oral anticoagulation in AM per MD note.  Goal of Therapy:  Heparin level 0.3-0.7 units/ml Monitor platelets by anticoagulation protocol: Yes   Plan:  Heparin 2500 unit bolus Increase heparin drip to 1850 units/hr 6h heparin level Daily CBC/heparin level Monitor for bleeding F/u long-term anticoagulation plans   Babs Bertin, PharmD, BCPS Clinical Pharmacist 02/20/2017 2:48 PM

## 2017-02-20 NOTE — ED Triage Notes (Signed)
Pt presents to ED for right sided chest pain starting 2 day ago.  Patient states when he tries to ay down to sleep, he begins coughing uncontollably (coughing up yellow and brown sputum) and then experiences right sided chest pain.  Denies radiation.  C/o SOB, c/o light-headedness with symptoms.

## 2017-02-20 NOTE — Progress Notes (Signed)
Received patient from Dr. Judd Lien. Devin [name] is 56 year old with pmh of HIV who presents with 2-4 days of right-sided chest and back pain.  VS: T 53F, pulse 109-112, respirations 22-25, BP maintained, O2 sats 94-95% on RA Labs: WBC 11.5 CT angiogram shows bilateral PE R>L.  Patient started on heparin drip. Admitting as observation to a telemetry bed.

## 2017-02-20 NOTE — H&P (Signed)
History and Physical    Devin [name] HLK:[id number] DOB: 1960/12/20 DOA: 02/20/2017   PCP: Robert Bellow, MD   Patient coming from:  Home    Chief Complaint: Chest pain   HPI: Devin [name] is a 56 y.o. male  With a history of HIV +followed by ID Dr. Johnnye Sima, hypertension, COPD, peripheral neuropathy, anxiety, chronic back pain in the setting of spondylosis, presenting with 2-3 day history of R sided Chest pain, cough when lying down, productive yellow sputum, accompanied by shortness of breath with deep inspiration and ocassional lightheadedness. Patient denies any fever, chills or night sweats. Denies lower extremity calf pain, but reported swelling prior to presentation, without erythema. Denies pre-syncopal episodes, palpitations or hemoptysis. Denies any bleeding issues such as epistaxis, hematemesis, hematuria or hematochezia. Denies history of cancer. Reports recent 1 ppd tobacco abuse. No ETOH or cocaine, but frequent marijuana. Patient denies taking hormone replacement therapy. Denies a history or family history of peripartum thromboembolic event or family history of recurrent miscarriages.Sedentary lifestyle due to chronic back pain. He takes regular NSAIDs. Does not take regularly ASA. No prior history of PE or DVT in patient or family. Never had a hematological evaluation prior to this admission or bone marrow biopsy.No recent long distance trips. Denies any recent sick contacts.  . ED Course:  BP 103/75   Pulse (!) 110   Temp 98 F (36.7 C) (Oral)   Resp (!) 35   SpO2 93%    WBC 11.5 creatinine 1.2 troponin 0 hemoglobin 15.1 platelets 223 glucose 156 CT angio shows bilateral  R>L PE without heart strain Started on heparin drip EKG sinus tachycardia without any ACS    Review of Systems: As per HPI otherwise 10 point review of systems negative.   Past Medical History:  Diagnosis Date  . Chronic back pain   . COPD (chronic obstructive pulmonary disease) ([place])   . HIV  infection ([place])   . Hypertension   . Prolapsed disk 10/28  . Scoliosis 08/24/13    Past Surgical History:  Procedure Laterality Date  . KNEE SURGERY     right    Social History Social History   Social History  . Marital status: Divorced    Spouse name: N/A  . Number of children: N/A  . Years of education: N/A   Occupational History  . Not on file.   Social History Main Topics  . Smoking status: Current Every Day Smoker    Packs/day: 1.00    Years: 30.00    Types: Cigarettes  . Smokeless tobacco: Never Used  . Alcohol use Yes     Comment: beer occasional, rare  . Drug use: Yes    Frequency: 1.0 time per week    Types: Marijuana     Comment: occasional  . Sexual activity: Yes    Partners: Female     Comment: pt. given condoms   Other Topics Concern  . Not on file   Social History Narrative  . No narrative on file     No Known Allergies  Family History  Problem Relation Age of Onset  . Diabetes Father   . Colon cancer Neg Hx       Prior to Admission medications   Medication Sig Start Date End Date Taking? Authorizing Provider  albuterol (PROVENTIL HFA;VENTOLIN HFA) 108 (90 BASE) MCG/ACT inhaler Inhale 2 puffs into the lungs every 4 (four) hours as needed for wheezing or shortness of breath.   Yes Historical Provider,  MD  albuterol (PROVENTIL) (2.5 MG/3ML) 0.083% nebulizer solution Take 2.5 mg by nebulization every 6 (six) hours as needed for wheezing or shortness of breath.   Yes Historical Provider, MD  amitriptyline (ELAVIL) 50 MG tablet Take 50 mg by mouth at bedtime as needed for sleep.  09/12/13  Yes Historical Provider, MD  COMBIVENT RESPIMAT 20-100 MCG/ACT AERS respimat Inhale 2 puffs into the lungs every 4 (four) hours as needed. wheezing 12/14/13  Yes Historical Provider, MD  cyclobenzaprine (FLEXERIL) 10 MG tablet Take 10 mg by mouth 3 (three) times daily as needed for muscle spasms.   Yes Historical Provider, MD  diazepam (VALIUM) 10 MG tablet  Take 10 mg by mouth 3 (three) times daily as needed for anxiety.    Yes Historical Provider, MD  elvitegravir-cobicistat-emtricitabine-tenofovir (GENVOYA) 150-150-200-10 MG TABS tablet Take 1 tablet by mouth daily with breakfast. 08/21/16  Yes Campbell Riches, MD  gabapentin (NEURONTIN) 600 MG tablet Take 600 mg by mouth every 4 (four) hours as needed (pain).   Yes Historical Provider, MD  HYDROcodone-acetaminophen (NORCO) 10-325 MG per tablet Take 2 tablets by mouth every 4 (four) hours as needed for moderate pain.  11/21/13  Yes Historical Provider, MD  ibuprofen (ADVIL,MOTRIN) 600 MG tablet Take 1 tablet (600 mg total) by mouth every 8 (eight) hours as needed. Patient taking differently: Take 600 mg by mouth every 8 (eight) hours as needed for moderate pain.  08/28/14  Yes Jola Schmidt, MD  lisinopril (PRINIVIL,ZESTRIL) 20 MG tablet Take 20 mg by mouth 2 (two) times daily.   Yes Historical Provider, MD  Multiple Vitamin (MULTIVITAMIN WITH MINERALS) TABS tablet Take 1 tablet by mouth daily.   Yes Historical Provider, MD  omeprazole (PRILOSEC) 40 MG capsule Take 40 mg by mouth daily.     Yes Historical Provider, MD  oxyCODONE-acetaminophen (PERCOCET/ROXICET) 5-325 MG per tablet Take 1 tablet by mouth every 4 (four) hours as needed for severe pain. 08/28/14  Yes Jola Schmidt, MD  torsemide (DEMADEX) 20 MG tablet Take 20 mg by mouth daily.   Yes Historical Provider, MD    Physical Exam:  Vitals:   02/20/17 0129 02/20/17 0430 02/20/17 0445 02/20/17 0500  BP: (!) 139/92 112/73 113/78 103/75  Pulse: (!) 112 (!) 109 (!) 109 (!) 110  Resp: (!) 22  (!) 25 (!) 35  Temp: 98 F (36.7 C)     TempSrc: Oral     SpO2: 95% 97% 94% 93%   Constitutional: NAD, calm, uncomfortable due to right sided chest pain  Eyes: PERRL, lids and conjunctivae normal ENMT: Mucous membranes are moist, without exudate or lesions  Neck: normal, supple, no masses, no thyromegaly Respiratory:  Poor inspiratory effort due to  pain , essentially clear to auscultation bilaterally, no wheezing, no crackles.   Cardiovascular: Regular rate and rhythm, no murmurs, rubs or gallops. No extremity edema. 2+ pedal pulses. No carotid bruits.  Abdomen: Soft, non tender, No hepatosplenomegaly. Bowel sounds positive.  Musculoskeletal: no clubbing / cyanosis. Moves all extremities Skin: no jaundice, No lesions.  Neurologic: normal sensation, known neuropathy  Strength equal in all extremities Psychiatric:   Alert and oriented x 3. Normal mood.     Labs on Admission: I have personally reviewed following labs and imaging studies  CBC:  Recent Labs Lab 02/20/17 0130  WBC 11.5*  HGB 15.1  HCT 43.6  MCV 97.1  PLT 850    Basic Metabolic Panel:  Recent Labs Lab 02/20/17 0130  NA 135  K 3.7  CL 102  CO2 23  GLUCOSE 156*  BUN 8  CREATININE 1.20  CALCIUM 9.4    GFR: CrCl cannot be calculated (Unknown ideal weight.).  Liver Function Tests: No results for input(s): AST, ALT, ALKPHOS, BILITOT, PROT, ALBUMIN in the last 168 hours. No results for input(s): LIPASE, AMYLASE in the last 168 hours. No results for input(s): AMMONIA in the last 168 hours.  Coagulation Profile: No results for input(s): INR, PROTIME in the last 168 hours.  Cardiac Enzymes: No results for input(s): CKTOTAL, CKMB, CKMBINDEX, TROPONINI in the last 168 hours.  BNP (last 3 results) No results for input(s): PROBNP in the last 8760 hours.  HbA1C: No results for input(s): HGBA1C in the last 72 hours.  CBG: No results for input(s): GLUCAP in the last 168 hours.  Lipid Profile: No results for input(s): CHOL, HDL, LDLCALC, TRIG, CHOLHDL, LDLDIRECT in the last 72 hours.  Thyroid Function Tests: No results for input(s): TSH, T4TOTAL, FREET4, T3FREE, THYROIDAB in the last 72 hours.  Anemia Panel: No results for input(s): VITAMINB12, FOLATE, FERRITIN, TIBC, IRON, RETICCTPCT in the last 72 hours.  Urine analysis:    Component Value  Date/Time   COLORURINE YELLOW 06/13/2010 0510   APPEARANCEUR CLEAR 06/13/2010 0510   LABSPEC 1.025 06/13/2010 0510   PHURINE 6.0 06/13/2010 0510   GLUCOSEU NEGATIVE 06/13/2010 0510   GLUCOSEU NEG mg/dL 02/19/2008 2028   HGBUR NEGATIVE 06/13/2010 0510   BILIRUBINUR NEGATIVE 06/13/2010 0510   KETONESUR NEGATIVE 06/13/2010 0510   PROTEINUR NEGATIVE 06/13/2010 0510   UROBILINOGEN 0.2 06/13/2010 0510   NITRITE NEGATIVE 06/13/2010 0510   LEUKOCYTESUR NEGATIVE 06/13/2010 0510    Sepsis Labs: _0 (procalcitonin:4,lacticidven:4) )No results found for this or any previous visit (from the past 240 hour(s)).   Radiological Exams on Admission: Dg Chest 2 View  Result Date: 02/20/2017 CLINICAL DATA:  Acute onset of right-sided chest pain, shortness of breath and cough. Initial encounter. EXAM: CHEST  2 VIEW COMPARISON:  Chest radiograph performed 12/03/2016 FINDINGS: Left basilar airspace opacity is concerning for pneumonia. Bibasilar atelectasis or scarring is seen. No definite pleural effusion or pneumothorax is identified. The heart is normal in size. No acute osseous abnormalities are identified. IMPRESSION: 1. Left basilar airspace opacity is concerning for pneumonia. 2. Underlying bibasilar atelectasis or scarring seen. Electronically Signed   By: Garald Balding M.D.   On: 02/20/2017 01:55   Ct Angio Chest Pe W Or Wo Contrast  Result Date: 02/20/2017 CLINICAL DATA:  Right-sided chest pain EXAM: CT ANGIOGRAPHY CHEST WITH CONTRAST TECHNIQUE: Multidetector CT imaging of the chest was performed using the standard protocol during bolus administration of intravenous contrast. Multiplanar CT image reconstructions and MIPs were obtained to evaluate the vascular anatomy. CONTRAST:  78 mL Isovue 370 COMPARISON:  Chest radiograph 02/20/2017 FINDINGS: Cardiovascular: Contrast injection is sufficient to demonstrate satisfactory opacification of the pulmonary arteries to the segmental level. There is a  central filling defect within the distal right main pulmonary artery that extends into the right lower lobar artery and the right upper lobar artery. The right middle lobe arteries are spared. The emboli extend all the way into the most distal aspect of the right lower lobe segmental branches. On the left, there are filling defects within the left lower lobe segmental branches. The main pulmonary artery is within normal limits for size. There is no CT evidence of acute right heart strain. The visualized aorta is normal. There is a normal variant aortic arch branching pattern with the  brachiocephalic and left common carotid arteries sharing a common origin. Heart size is normal, without pericardial effusion. Mediastinum/Nodes: No mediastinal, hilar or axillary lymphadenopathy. The visualized thyroid and thoracic esophageal course are unremarkable. Lungs/Pleura: Peripheral opacities in the posterior right upper lobe likely indicate pulmonary infarction. There is bibasilar subsegmental atelectasis. No pleural effusion. Upper Abdomen: Contrast bolus timing is not optimized for evaluation of the abdominal organs. Within this limitation, the visualized organs of the upper abdomen are normal. Musculoskeletal: No chest wall abnormality. No acute or significant osseous findings. Review of the MIP images confirms the above findings. IMPRESSION: 1. Acute bilateral pulmonary emboli within the right main pulmonary artery; right upper and right lower lobar arteries, extending into right upper and lower lobe segmental branches; and left lower lobe segmental arteries. 2. No CT evidence of right heart strain. 3. Posterior right upper lobe peripheral opacities likely indicate an area of infarcted lung. Critical Value/emergent results were called by telephone at the time of interpretation on 02/20/2017 at 5:51 am to Dr. Veryl Speak , who verbally acknowledged these results. Electronically Signed   By: Ulyses Jarred M.D.   On:  02/20/2017 05:58    EKG: Independently reviewed.  Assessment/Plan Active Problems:   CHEST PAIN   Pulmonary emboli (HCC)   Human immunodeficiency virus (HIV) disease (HCC)   Tobacco use disorder   LOW BACK PAIN   Essential hypertension, benign   Pulmonary Embolism, Bilateral. Well's criteria 4.5 and 0-1 for DVT  Admitted w SOB/ R sided CP  Sats 90s in RA . Abnormal CTA with R>L PE without R heart strain. Tn at 0  . EKG SR, normal . Placed on Heparin drip. Risk factors include decreased mobility and cigarette smoking WBC 11.5 likely reactive Admit to tele obs O2 as needed  Pharmacy consult to manage Heparin drip; transition to oral anticoagulation in am  Bilateral lower extremity  US to rule out DVT Tobacco cessation counseled. Will give Nicotine patch   Leukocytosis, likely reactive in the setting of COPD without exacerbation  WBC 11.5  Afebrile.   Blood cultures  Repeat CBC in AM  Continue albuterol nebs for COPD   HIV positive, followed by Dr. Johnnye Sima. No acute issues  Continue antiretrovirals    Hypertension BP 103/75   Pulse 110   Controlled Continue home anti-hypertensive medications   GERD, no acute symptoms Continue PPI   Chronic back pain with neuropathy in the setting of radiculopathy followed at pain clinic in [facility]. No acute issues  Continue home meds and Neurontin  Elevated glucose, currently at 156. No history of DM. No recent steroids  Check A1C   Depression/ Anxiety Continue Elavil and Diazepam     DVT prophylaxis:Heparin  Code Status:   Full     Family Communication:  Discussed with patient Disposition Plan: Expect patient to be discharged to home after condition improves Consults called:    none Admission status:Tele  Obs   Mackinsey Pelland E, PA-C [facility]   02/20/2017, 7:17 AM

## 2017-02-20 NOTE — Progress Notes (Signed)
ANTICOAGULATION CONSULT NOTE - Initial Consult  Pharmacy Consult for Heparin  Indication: pulmonary embolus  No Known Allergies  Patient Measurements: ~90 kg  Vital Signs: Temp: 98 F (36.7 C) (04/26 0129) Temp Source: Oral (04/26 0129) BP: 113/78 (04/26 0445) Pulse Rate: 109 (04/26 0445)  Labs:  Recent Labs  02/20/17 0130  HGB 15.1  HCT 43.6  PLT 223  CREATININE 1.20    Medical History: Past Medical History:  Diagnosis Date  . Chronic back pain   . COPD (chronic obstructive pulmonary disease) ([facility])   . HIV infection ([facility])   . Hypertension   . Prolapsed disk 10/28  . Scoliosis 08/24/13   Assessment: 56 y/o M presents with right sided CP x 2 days, found to have new onset PE on CT Angio, starting heparin per pharmacy, CBC/renal function ok, PTA meds reviewed.   Goal of Therapy:  Heparin level 0.3-0.7 units/ml Monitor platelets by anticoagulation protocol: Yes   Plan:  Heparin 6000 units BOLUS Start heparin drip at 1500 units/hr 1400 HL Daily CBC/HL Monitor for bleeding   Abran Duke 02/20/2017,6:02 AM

## 2017-02-20 NOTE — Progress Notes (Signed)
ANTICOAGULATION CONSULT NOTE - FOLLOW UP    HL = 0.31 (goal 0.3 - 0.7 units/mL)   Assessment: 80 YOM with new, extensive bilateral PE and RLE DVT to continue on IV heparin.  Heparin level is therapeutic and toward the low end of normal.  No bleeding reported.   Plan: - Increase heparin gtt to 1950 units/hr - F/U AM labs, updated height and weight    Marriana Hibberd D. Laney Potash, PharmD, BCPS 02/20/2017, 10:00 PM

## 2017-02-20 NOTE — ED Provider Notes (Addendum)
[facility]-EMERGENCY DEPT Provider Note   CSN: [id number] Arrival date & time: 02/20/17  0123     History   Chief Complaint Chief Complaint  Patient presents with  . Chest Pain    HPI Devin [name] is a 56 y.o. male.  Patient is a 56 year old male with past medical history of HIV disease, chronic back and chest pain, COPD. He presents today for evaluation of severe pain in the right side of his chest. This started 4 days ago after lifting garbage into the dumpster. He denies any fall. He denies any leg pain or swelling. He denies any fevers, chills, or cough.   The history is provided by the patient.  Chest Pain   This is a new problem. Episode onset: 4 days ago. The problem occurs constantly. The problem has been gradually worsening. The pain is associated with lifting, movement and raising an arm. The pain is severe. The pain does not radiate. The symptoms are aggravated by certain positions and deep breathing. Pertinent negatives include no cough, no shortness of breath and no sputum production. He has tried nothing for the symptoms.    Past Medical History:  Diagnosis Date  . Chronic back pain   . COPD (chronic obstructive pulmonary disease) ([facility])   . HIV infection ([facility])   . Hypertension   . Prolapsed disk 10/28  . Scoliosis 08/24/13    Patient Active Problem List   Diagnosis Date Noted  . Dysphagia 01/11/2014  . Encounter for screening colonoscopy 01/11/2014  . ETOH abuse 12/01/2013  . Encounter for long-term (current) use of other medications 04/26/2013  . Essential hypertension, benign 04/12/2013  . CHEST PAIN 06/12/2010  . ABDOMINAL PAIN 06/12/2010  . ACUTE SINUSITIS, UNSPECIFIED 11/24/2009  . ALLERGIC RHINITIS 02/14/2009  . Tobacco use disorder 11/15/2008  . ACUTE BRONCHITIS 03/04/2008  . DEPRESSION 12/03/2006  . LOW BACK PAIN 12/03/2006  . Human immunodeficiency virus (HIV) disease ([facility]) 11/13/2006  . DRUG ABUSE 11/13/2006  . COPD 11/13/2006  . GERD 11/13/2006     Past Surgical History:  Procedure Laterality Date  . KNEE SURGERY     right       Home Medications    Prior to Admission medications   Medication Sig Start Date End Date Taking? Authorizing Provider  albuterol (PROVENTIL HFA;VENTOLIN HFA) 108 (90 BASE) MCG/ACT inhaler Inhale 2 puffs into the lungs every 4 (four) hours as needed for wheezing or shortness of breath.    Historical Provider, MD  amitriptyline (ELAVIL) 50 MG tablet Take 50 mg by mouth at bedtime as needed for sleep.  09/12/13   Historical Provider, MD  COMBIVENT RESPIMAT 20-100 MCG/ACT AERS respimat Inhale 2 puffs into the lungs every 4 (four) hours as needed. wheezing 12/14/13   Historical Provider, MD  cyclobenzaprine (FLEXERIL) 10 MG tablet Take 10 mg by mouth 3 (three) times daily as needed for muscle spasms.    Historical Provider, MD  diazepam (VALIUM) 10 MG tablet Take 10 mg by mouth 3 (three) times daily as needed for anxiety.     Historical Provider, MD  elvitegravir-cobicistat-emtricitabine-tenofovir (GENVOYA) 150-150-200-10 MG TABS tablet Take 1 tablet by mouth daily with breakfast. 08/21/16   Ginnie Smart, MD  HYDROcodone-acetaminophen [facility]) 10-325 MG per tablet Take 2 tablets by mouth every 4 (four) hours as needed for moderate pain.  11/21/13   Historical Provider, MD  ibuprofen (ADVIL,MOTRIN) 600 MG tablet Take 1 tablet (600 mg total) by mouth every 8 (eight) hours as needed. 08/28/14  Azalia Bilis, MD  omeprazole (PRILOSEC) 40 MG capsule Take 40 mg by mouth daily.      Historical Provider, MD  oxyCODONE-acetaminophen (PERCOCET/ROXICET) 5-325 MG per tablet Take 1 tablet by mouth every 4 (four) hours as needed for severe pain. 08/28/14   Azalia Bilis, MD    Family History Family History  Problem Relation Age of Onset  . Diabetes Father   . Colon cancer Neg Hx     Social History Social History  Substance Use Topics  . Smoking status: Current Every Day Smoker    Packs/day: 1.00    Years: 30.00     Types: Cigarettes  . Smokeless tobacco: Never Used  . Alcohol use Yes     Comment: beer occasional, rare     Allergies   Patient has no known allergies.   Review of Systems Review of Systems  Respiratory: Negative for cough, sputum production and shortness of breath.   Cardiovascular: Positive for chest pain.  All other systems reviewed and are negative.    Physical Exam Updated Vital Signs BP (!) 139/92 (BP Location: Right Arm)   Pulse (!) 112   Temp 98 F (36.7 C) (Oral)   Resp (!) 22   SpO2 95%   Physical Exam  Constitutional: He is oriented to person, place, and time. He appears well-developed and well-nourished. No distress.  HENT:  Head: Normocephalic and atraumatic.  Mouth/Throat: Oropharynx is clear and moist.  Neck: Normal range of motion. Neck supple.  Cardiovascular: Normal rate and regular rhythm.  Exam reveals no friction rub.   No murmur heard. Pulmonary/Chest: Effort normal and breath sounds normal. No respiratory distress. He has no wheezes. He has no rales. He exhibits tenderness.  There is tenderness to palpation of the right lateral chest wall and right anterior chest wall. There is no crepitus.  Abdominal: Soft. Bowel sounds are normal. He exhibits no distension. There is no tenderness.  Musculoskeletal: Normal range of motion. He exhibits no edema.  Neurological: He is alert and oriented to person, place, and time. Coordination normal.  Skin: Skin is warm and dry. He is not diaphoretic.  Nursing note and vitals reviewed.    ED Treatments / Results  Labs (all labs ordered are listed, but only abnormal results are displayed) Labs Reviewed  BASIC METABOLIC PANEL - Abnormal; Notable for the following:       Result Value   Glucose, Bld 156 (*)    All other components within normal limits  CBC - Abnormal; Notable for the following:    WBC 11.5 (*)    All other components within normal limits  I-STAT TROPOININ, ED    EKG  EKG  Interpretation  Date/Time:  Thursday February 20 2017 01:29:14 EDT Ventricular Rate:  112 PR Interval:  144 QRS Duration: 88 QT Interval:  336 QTC Calculation: 458 R Axis:   10 Text Interpretation:  Sinus tachycardia Otherwise normal ECG Confirmed by Davielle Lingelbach  MD, Zai Chmiel ([id number]) on 02/20/2017 4:12:00 AM       Radiology Dg Chest 2 View  Result Date: 02/20/2017 CLINICAL DATA:  Acute onset of right-sided chest pain, shortness of breath and cough. Initial encounter. EXAM: CHEST  2 VIEW COMPARISON:  Chest radiograph performed 12/03/2016 FINDINGS: Left basilar airspace opacity is concerning for pneumonia. Bibasilar atelectasis or scarring is seen. No definite pleural effusion or pneumothorax is identified. The heart is normal in size. No acute osseous abnormalities are identified. IMPRESSION: 1. Left basilar airspace opacity is concerning for pneumonia. 2.  Underlying bibasilar atelectasis or scarring seen. Electronically Signed   By: Roanna Raider M.D.   On: 02/20/2017 01:55    Procedures Procedures (including critical care time)  Medications Ordered in ED Medications  ketorolac (TORADOL) 30 MG/ML injection 30 mg (not administered)  morphine 4 MG/ML injection 4 mg (not administered)     Initial Impression / Assessment and Plan / ED Course  I have reviewed the triage vital signs and the nursing notes.  Pertinent labs & imaging results that were available during my care of the patient were reviewed by me and considered in my medical decision making (see chart for details).  Patient presents here with complaints of pain in his right chest and back. This started several days ago. He is tachycardic in the ER, but is not hypotensive. Chest x-ray, EKG, laboratory studies are unremarkable and I highly doubt a cardiac etiology. I then ordered a CT angio of the chest which revealed bilateral pulmonary emboli, right greater than left. I've discussed this finding with Dr. Katrinka Blazing from the hospitalist  service who agrees to admit.  CRITICAL CARE Performed by: Geoffery Lyons Total critical care time: 45 minutes Critical care time was exclusive of separately billable procedures and treating other patients. Critical care was necessary to treat or prevent imminent or life-threatening deterioration. Critical care was time spent personally by me on the following activities: development of treatment plan with patient and/or surrogate as well as nursing, discussions with consultants, evaluation of patient's response to treatment, examination of patient, obtaining history from patient or surrogate, ordering and performing treatments and interventions, ordering and review of laboratory studies, ordering and review of radiographic studies, pulse oximetry and re-evaluation of patient's condition.   Final Clinical Impressions(s) / ED Diagnoses   Final diagnoses:  None    New Prescriptions New Prescriptions   No medications on file     Geoffery Lyons, MD 02/20/17 1610    Geoffery Lyons, MD 02/20/17 [phone]

## 2017-02-20 NOTE — ED Notes (Signed)
Patient transported to Ultrasound 

## 2017-02-20 NOTE — ED Notes (Signed)
Attempted to call report

## 2017-02-21 ENCOUNTER — Observation Stay (HOSPITAL_COMMUNITY): Payer: Medicare Other

## 2017-02-21 DIAGNOSIS — N179 Acute kidney failure, unspecified: Secondary | ICD-10-CM | POA: Diagnosis present

## 2017-02-21 DIAGNOSIS — I10 Essential (primary) hypertension: Secondary | ICD-10-CM | POA: Diagnosis present

## 2017-02-21 DIAGNOSIS — R079 Chest pain, unspecified: Secondary | ICD-10-CM | POA: Diagnosis present

## 2017-02-21 DIAGNOSIS — I2699 Other pulmonary embolism without acute cor pulmonale: Secondary | ICD-10-CM | POA: Diagnosis present

## 2017-02-21 DIAGNOSIS — M479 Spondylosis, unspecified: Secondary | ICD-10-CM | POA: Diagnosis present

## 2017-02-21 DIAGNOSIS — M419 Scoliosis, unspecified: Secondary | ICD-10-CM | POA: Diagnosis present

## 2017-02-21 DIAGNOSIS — Z833 Family history of diabetes mellitus: Secondary | ICD-10-CM | POA: Diagnosis not present

## 2017-02-21 DIAGNOSIS — B20 Human immunodeficiency virus [HIV] disease: Secondary | ICD-10-CM | POA: Diagnosis present

## 2017-02-21 DIAGNOSIS — G629 Polyneuropathy, unspecified: Secondary | ICD-10-CM | POA: Diagnosis present

## 2017-02-21 DIAGNOSIS — R739 Hyperglycemia, unspecified: Secondary | ICD-10-CM | POA: Diagnosis present

## 2017-02-21 DIAGNOSIS — K219 Gastro-esophageal reflux disease without esophagitis: Secondary | ICD-10-CM | POA: Diagnosis present

## 2017-02-21 DIAGNOSIS — R000 Tachycardia, unspecified: Secondary | ICD-10-CM | POA: Diagnosis present

## 2017-02-21 DIAGNOSIS — Z79899 Other long term (current) drug therapy: Secondary | ICD-10-CM | POA: Diagnosis not present

## 2017-02-21 DIAGNOSIS — F419 Anxiety disorder, unspecified: Secondary | ICD-10-CM | POA: Diagnosis present

## 2017-02-21 DIAGNOSIS — J449 Chronic obstructive pulmonary disease, unspecified: Secondary | ICD-10-CM | POA: Diagnosis present

## 2017-02-21 DIAGNOSIS — F17210 Nicotine dependence, cigarettes, uncomplicated: Secondary | ICD-10-CM | POA: Diagnosis present

## 2017-02-21 DIAGNOSIS — F329 Major depressive disorder, single episode, unspecified: Secondary | ICD-10-CM | POA: Diagnosis present

## 2017-02-21 DIAGNOSIS — G8929 Other chronic pain: Secondary | ICD-10-CM | POA: Diagnosis present

## 2017-02-21 DIAGNOSIS — M545 Low back pain: Secondary | ICD-10-CM | POA: Diagnosis present

## 2017-02-21 DIAGNOSIS — D72829 Elevated white blood cell count, unspecified: Secondary | ICD-10-CM | POA: Diagnosis present

## 2017-02-21 LAB — URINALYSIS, ROUTINE W REFLEX MICROSCOPIC
Bacteria, UA: NONE SEEN
Bilirubin Urine: NEGATIVE
GLUCOSE, UA: NEGATIVE mg/dL
Hgb urine dipstick: NEGATIVE
Ketones, ur: NEGATIVE mg/dL
Leukocytes, UA: NEGATIVE
Nitrite: NEGATIVE
PH: 5 (ref 5.0–8.0)
Protein, ur: 30 mg/dL — AB
SPECIFIC GRAVITY, URINE: 1.042 — AB (ref 1.005–1.030)
SQUAMOUS EPITHELIAL / LPF: NONE SEEN

## 2017-02-21 LAB — COMPREHENSIVE METABOLIC PANEL
ALT: 13 U/L — AB (ref 17–63)
AST: 23 U/L (ref 15–41)
Albumin: 3.8 g/dL (ref 3.5–5.0)
Alkaline Phosphatase: 54 U/L (ref 38–126)
Anion gap: 11 (ref 5–15)
BUN: 18 mg/dL (ref 6–20)
CHLORIDE: 103 mmol/L (ref 101–111)
CO2: 22 mmol/L (ref 22–32)
CREATININE: 1.5 mg/dL — AB (ref 0.61–1.24)
Calcium: 9.5 mg/dL (ref 8.9–10.3)
GFR calc Af Amer: 60 mL/min — ABNORMAL LOW (ref >60)
GFR calc non Af Amer: 51 mL/min — ABNORMAL LOW (ref >60)
Glucose, Bld: 113 mg/dL — ABNORMAL HIGH (ref 65–99)
Potassium: 3.8 mmol/L (ref 3.5–5.1)
SODIUM: 136 mmol/L (ref 135–145)
Total Bilirubin: 0.4 mg/dL (ref 0.3–1.2)
Total Protein: 8.3 g/dL — ABNORMAL HIGH (ref 6.5–8.1)

## 2017-02-21 LAB — CBC
HCT: 42.8 % (ref 39.0–52.0)
Hemoglobin: 15 g/dL (ref 13.0–17.0)
MCH: 33.7 pg (ref 26.0–34.0)
MCHC: 35 g/dL (ref 30.0–36.0)
MCV: 96.2 fL (ref 78.0–100.0)
PLATELETS: 260 10*3/uL (ref 150–400)
RBC: 4.45 MIL/uL (ref 4.22–5.81)
RDW: 11.7 % (ref 11.5–15.5)
WBC: 11.2 10*3/uL — AB (ref 4.0–10.5)

## 2017-02-21 LAB — HEPARIN LEVEL (UNFRACTIONATED)
HEPARIN UNFRACTIONATED: 0.37 [IU]/mL (ref 0.30–0.70)
HEPARIN UNFRACTIONATED: 0.32 [IU]/mL (ref 0.30–0.70)

## 2017-02-21 LAB — PROTIME-INR
INR: 1.1
Prothrombin Time: 14.2 seconds (ref 11.4–15.2)

## 2017-02-21 LAB — GLUCOSE, CAPILLARY: Glucose-Capillary: 178 mg/dL — ABNORMAL HIGH (ref 65–99)

## 2017-02-21 LAB — HEMOGLOBIN A1C
Hgb A1c MFr Bld: 5.8 % — ABNORMAL HIGH (ref 4.8–5.6)
MEAN PLASMA GLUCOSE: 120 mg/dL

## 2017-02-21 MED ORDER — GABAPENTIN 600 MG PO TABS
300.0000 mg | ORAL_TABLET | Freq: Three times a day (TID) | ORAL | Status: DC | PRN
  Filled 2017-02-21: qty 1

## 2017-02-21 MED ORDER — OXYCODONE HCL ER 15 MG PO T12A
15.0000 mg | EXTENDED_RELEASE_TABLET | Freq: Two times a day (BID) | ORAL | Status: DC
  Administered 2017-02-21 – 2017-02-22 (×3): 15 mg via ORAL
  Filled 2017-02-21 (×3): qty 1

## 2017-02-21 MED ORDER — HEPARIN (PORCINE) IN NACL 100-0.45 UNIT/ML-% IJ SOLN
2150.0000 [IU]/h | INTRAMUSCULAR | Status: DC
  Administered 2017-02-21 – 2017-02-22 (×2): 2150 [IU]/h via INTRAVENOUS
  Filled 2017-02-21: qty 250

## 2017-02-21 MED ORDER — NICOTINE 21 MG/24HR TD PT24
21.0000 mg | MEDICATED_PATCH | Freq: Every day | TRANSDERMAL | Status: DC
  Administered 2017-02-21 – 2017-02-22 (×2): 21 mg via TRANSDERMAL
  Filled 2017-02-21 (×2): qty 1

## 2017-02-21 MED ORDER — SODIUM CHLORIDE 0.9 % IV SOLN
INTRAVENOUS | Status: AC
  Administered 2017-02-21: 17:00:00 via INTRAVENOUS

## 2017-02-21 MED ORDER — LEVOFLOXACIN 750 MG PO TABS
750.0000 mg | ORAL_TABLET | Freq: Every day | ORAL | Status: DC
  Administered 2017-02-21 – 2017-02-22 (×2): 750 mg via ORAL
  Filled 2017-02-21 (×2): qty 1

## 2017-02-21 MED ORDER — NICOTINE POLACRILEX 2 MG MT GUM
2.0000 mg | CHEWING_GUM | OROMUCOSAL | Status: DC | PRN
  Filled 2017-02-21: qty 1

## 2017-02-21 MED ORDER — KETOROLAC TROMETHAMINE 15 MG/ML IJ SOLN: 30.0000 mg | Freq: Four times a day (QID) | INTRAMUSCULAR | Status: DC | PRN

## 2017-02-21 MED ORDER — KETOROLAC TROMETHAMINE 30 MG/ML IJ SOLN
15.0000 mg | Freq: Once | INTRAMUSCULAR | Status: AC
  Administered 2017-02-21: 15 mg via INTRAVENOUS
  Filled 2017-02-21: qty 1

## 2017-02-21 NOTE — Progress Notes (Signed)
ANTICOAGULATION and ANTIBIOITC CONSULT NOTE - Follow Up Consult  Pharmacy Consult for Heparin; Levaquin  Indication: pulmonary embolus and DVT; pulmonary coverage  No Known Allergies  Patient Measurements: Height:  (175.3 cm) Weight: 209 lb 14.1 oz (95.2 kg) IBW/kg (Calculated) : 70.7 Heparin Dosing Weight: 90 kg  Vital Signs: Temp: 97.5 F (36.4 C) (04/27 0238) Temp Source: Oral (04/27 0238) BP: 144/93 (04/27 0238) Pulse Rate: 106 (04/27 0238)  Labs:  Recent Labs  02/20/17 0130 02/20/17 1353 02/20/17 2123 02/21/17 0836  HGB 15.1  --   --  15.0  HCT 43.6  --   --  42.8  PLT 223  --   --  260  LABPROT  --   --   --  14.2  INR  --   --   --  1.10  HEPARINUNFRC  --  0.19* 0.31 0.37  CREATININE 1.20  --   --  1.50*    Estimated Creatinine Clearance: 64.8 mL/min (A) (by C-G formula based on SCr of 1.5 mg/dL (H)).  Assessment:   56 yr old male with bilateral PE per CTA on 4/26 and right leg DVT per venous duplex.   Heparin level is low therapeutic (0.37) on 1950 units/hr. Prefer mid-range therapeutic.    Levaquin for pulmonary coverage.  PO okay per Dr. Susie Cassette.  Goal of Therapy:  Heparin level 0.3-0.7 units/ml Monitor platelets by anticoagulation protocol: Yes  Appropriate Levaquin dose for renal function and indication   Plan:   Increase heparin drip to 2050 units/hr to try to keep level in target range.  Heparin level ~6 hrs after increase.  Daily heparin level and CBC while on heparin.  Noted plan to change to Xarelto in 1-2 days.  Levaquin 750 mg PO q24hrs.  Expect up to 5 days of therapy.  Dennie Fetters, [place] Pager: [phone] 02/21/2017,2:00 PM

## 2017-02-21 NOTE — Progress Notes (Addendum)
[facility] PROGRESS NOTE  Devin [name] WUJ:[id number] DOB: 03/16/1961 DOA: 02/20/2017   PCP: Milana Obey, MD     Assessment/Plan: Active Problems:   Human immunodeficiency virus (HIV) disease (HCC)   Tobacco use disorder   LOW BACK PAIN   CHEST PAIN   Essential hypertension, benign   Pulmonary emboli (HCC)   Pulmonary infarct (HCC)   56 y.o. male  With a history of HIV +followed by ID Dr. Ninetta Lights, hypertension, COPD, peripheral neuropathy, anxiety, chronic back pain in the setting of spondylosis, presenting with 2-3 day history of R sided Chest pain, cough when lying down, productive yellow sputum, accompanied by shortness of breath with deep inspiration . Admitted for bilateral PE and DVT  Assessment and plan Pulmonary Embolism, Bilateral.  Admitted w SOB/ R sided CP  Sats 90s in RA . Abnormal CTA with R>L PE without R heart strain. Tn at 0  . EKG SR, normal . Placed on Heparin drip. Risk factors include decreased mobility and cigarette smoking WBC 11.5 likely reactive Continue telemetry O2 as needed  Pharmacy consult to manage Heparin drip; transition to Xarelto in one to 2 days  Bilateral lower extremity  US to rule out DVT Tobacco cessation counseled. Will give Nicotine patch  OxyContin to better control his chest pain and back pain  AKI Cr increased from 1.2 to 1.5 Dc Toradol lisinopril and demadex  Leukocytosis, likely reactive versus secondary to pulmonary infarction Low-grade fever Blood cultures no growth so far Small area of pulmonary infarction posterior right upper lobe, will start patient on levofloxacin Continue albuterol nebs for COPD   HIV positive, followed by Dr. Ninetta Lights. 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 No acute issues  Continue home meds and Neurontin dose decreased due to AKI   Elevated glucose, currently at 156. No history of DM. No recent steroids  Hemoglobin A1c 5.8  Depression/ Anxiety Continue Elavil and Diazepam       DVT prophylaxsis heparin  Code Status:  Full code      Family Communication: Discussed in detail with the patient, all imaging results, lab results explained to the patient   Disposition Plan:  3 days    Consultants:  None  Procedures:  None*  Antibiotics: Anti-infectives    Start     Dose/Rate Route Frequency Ordered Stop   02/20/17 1000  elvitegravir-cobicistat-emtricitabine-tenofovir (GENVOYA) 150-150-200-10 MG tablet 1 tablet     1 tablet Oral Daily with breakfast 02/20/17 0731           HPI/Subjective: Continues to complain of back pain  Objective: Vitals:   02/20/17 1331 02/20/17 2045 02/21/17 0238 02/21/17 0452  BP: 125/70 103/74 (!) 144/93   Pulse: (!) 111 99 (!) 106   Resp: Temp: 98 F (36.7 C) 99.8 F (37.7 C) 97.5 F (36.4 C)   TempSrc: Oral Oral Oral   SpO2: 96% 92% 96%   Weight:    95.2 kg (209 lb 14.1 oz)  Height:     (1.753 m)    Intake/Output Summary (Last 24 hours) at 02/21/17 0745 Last data filed at 02/21/17 0511  Gross per 24 hour  Intake  600 ml  Output              250 ml  Net              350 ml    Exam:  Examination:  General exam: Appears calm and comfortable  Respiratory system: Clear to auscultation. Respiratory effort normal. Cardiovascular system: S1 & S2 heard, RRR. No JVD, murmurs, rubs, gallops or clicks. No pedal edema. Gastrointestinal system: Abdomen is nondistended, soft and nontender. No organomegaly or masses felt. Normal bowel sounds heard. Central nervous system: Alert and oriented. No focal neurological deficits. Extremities: Symmetric 5 x 5 power. Skin: No rashes, lesions or  ulcers Psychiatry: Judgement and insight appear normal. Mood & affect appropriate.     Data Reviewed: I have personally reviewed following labs and imaging studies  Micro Results No results found for this or any previous visit (from the past 240 hour(s)).  Radiology Reports Dg Chest 2 View  Result Date: 02/20/2017 CLINICAL DATA:  Acute onset of right-sided chest pain, shortness of breath and cough. Initial encounter. EXAM: CHEST  2 VIEW COMPARISON:  Chest radiograph performed 12/03/2016 FINDINGS: Left basilar airspace opacity is concerning for pneumonia. Bibasilar atelectasis or scarring is seen. No definite pleural effusion or pneumothorax is identified. The heart is normal in size. No acute osseous abnormalities are identified. IMPRESSION: 1. Left basilar airspace opacity is concerning for pneumonia. 2. Underlying bibasilar atelectasis or scarring seen. Electronically Signed   By: Roanna Raider M.D.   On: 02/20/2017 01:55   Ct Angio Chest Pe W Or Wo Contrast  Result Date: 02/20/2017 CLINICAL DATA:  Right-sided chest pain EXAM: CT ANGIOGRAPHY CHEST WITH CONTRAST TECHNIQUE: Multidetector CT imaging of the chest was performed using the standard protocol during bolus administration of intravenous contrast. Multiplanar CT image reconstructions and MIPs were obtained to evaluate the vascular anatomy. CONTRAST:  78 mL Isovue 370 COMPARISON:  Chest radiograph 02/20/2017 FINDINGS: Cardiovascular: Contrast injection is sufficient to demonstrate satisfactory opacification of the pulmonary arteries to the segmental level. There is a central filling defect within the distal right main pulmonary artery that extends into the right lower lobar artery and the right upper lobar artery. The right middle lobe arteries are spared. The emboli extend all the way into the most distal aspect of the right lower lobe segmental branches. On the left, there are filling defects within the left lower lobe segmental branches.  The main pulmonary artery is within normal limits for size. There is no CT evidence of acute right heart strain. The visualized aorta is normal. There is a normal variant aortic arch branching pattern with the brachiocephalic and left common carotid arteries sharing a common origin. Heart size is normal, without pericardial effusion. Mediastinum/Nodes: No mediastinal, hilar or axillary lymphadenopathy. The visualized thyroid and thoracic esophageal course are unremarkable. Lungs/Pleura: Peripheral opacities in the posterior right upper lobe likely indicate pulmonary infarction. There is bibasilar subsegmental atelectasis. No pleural effusion. Upper Abdomen: Contrast bolus timing is not optimized for evaluation of the abdominal organs. Within this limitation, the visualized organs of the upper abdomen are normal. Musculoskeletal: No chest wall abnormality. No acute or significant osseous findings. Review of the MIP images confirms the above findings. IMPRESSION: 1. Acute bilateral pulmonary emboli within the right main pulmonary artery; right upper and right lower lobar arteries, extending into right upper and lower lobe segmental branches; and left lower lobe segmental arteries. 2. No CT evidence of right heart strain. 3. Posterior right upper lobe peripheral opacities  likely indicate an area of infarcted lung. Critical Value/emergent results were called by telephone at the time of interpretation on 02/20/2017 at 5:51 am to Dr. Geoffery Lyons , who verbally acknowledged these results. Electronically Signed   By: Deatra Robinson M.D.   On: 02/20/2017 05:58     CBC  Recent Labs Lab 02/20/17 0130  WBC 11.5*  HGB 15.1  HCT 43.6  PLT 223  MCV 97.1  MCH 33.6  MCHC 34.6  RDW 11.8    Chemistries   Recent Labs Lab 02/20/17 0130  NA 135  K 3.7  CL 102  CO2 23  GLUCOSE 156*  BUN 8  CREATININE 1.20  CALCIUM 9.4    ------------------------------------------------------------------------------------------------------------------ estimated creatinine clearance is 81.1 mL/min (by C-G formula based on SCr of 1.2 mg/dL). ------------------------------------------------------------------------------------------------------------------  Recent Labs  02/20/17 0737  HGBA1C 5.8*   ------------------------------------------------------------------------------------------------------------------ No results for input(s): CHOL, HDL, LDLCALC, TRIG, CHOLHDL, LDLDIRECT in the last 72 hours. ------------------------------------------------------------------------------------------------------------------ No results for input(s): TSH, T4TOTAL, T3FREE, THYROIDAB in the last 72 hours.  Invalid input(s): FREET3 ------------------------------------------------------------------------------------------------------------------ No results for input(s): VITAMINB12, FOLATE, FERRITIN, TIBC, IRON, RETICCTPCT in the last 72 hours.  Coagulation profile No results for input(s): INR, PROTIME in the last 168 hours.  No results for input(s): DDIMER in the last 72 hours.  Cardiac Enzymes No results for input(s): CKMB, TROPONINI, MYOGLOBIN in the last 168 hours.  Invalid input(s): CK ------------------------------------------------------------------------------------------------------------------ Invalid input(s): POCBNP   CBG:  Recent Labs Lab 02/21/17 0503  GLUCAP 178*       Studies: Dg Chest 2 View  Result Date: 02/20/2017 CLINICAL DATA:  Acute onset of right-sided chest pain, shortness of breath and cough. Initial encounter. EXAM: CHEST  2 VIEW COMPARISON:  Chest radiograph performed 12/03/2016 FINDINGS: Left basilar airspace opacity is concerning for pneumonia. Bibasilar atelectasis or scarring is seen. No definite pleural effusion or pneumothorax is identified. The heart is normal in size. No acute osseous  abnormalities are identified. IMPRESSION: 1. Left basilar airspace opacity is concerning for pneumonia. 2. Underlying bibasilar atelectasis or scarring seen. Electronically Signed   By: Roanna Raider M.D.   On: 02/20/2017 01:55   Ct Angio Chest Pe W Or Wo Contrast  Result Date: 02/20/2017 CLINICAL DATA:  Right-sided chest pain EXAM: CT ANGIOGRAPHY CHEST WITH CONTRAST TECHNIQUE: Multidetector CT imaging of the chest was performed using the standard protocol during bolus administration of intravenous contrast. Multiplanar CT image reconstructions and MIPs were obtained to evaluate the vascular anatomy. CONTRAST:  78 mL Isovue 370 COMPARISON:  Chest radiograph 02/20/2017 FINDINGS: Cardiovascular: Contrast injection is sufficient to demonstrate satisfactory opacification of the pulmonary arteries to the segmental level. There is a central filling defect within the distal right main pulmonary artery that extends into the right lower lobar artery and the right upper lobar artery. The right middle lobe arteries are spared. The emboli extend all the way into the most distal aspect of the right lower lobe segmental branches. On the left, there are filling defects within the left lower lobe segmental branches. The main pulmonary artery is within normal limits for size. There is no CT evidence of acute right heart strain. The visualized aorta is normal. There is a normal variant aortic arch branching pattern with the brachiocephalic and left common carotid arteries sharing a common origin. Heart size is normal, without pericardial effusion. Mediastinum/Nodes: No mediastinal, hilar or axillary lymphadenopathy. The visualized thyroid and thoracic esophageal course are unremarkable. Lungs/Pleura: Peripheral opacities in the posterior right upper lobe likely  indicate pulmonary infarction. There is bibasilar subsegmental atelectasis. No pleural effusion. Upper Abdomen: Contrast bolus timing is not optimized for evaluation of  the abdominal organs. Within this limitation, the visualized organs of the upper abdomen are normal. Musculoskeletal: No chest wall abnormality. No acute or significant osseous findings. Review of the MIP images confirms the above findings. IMPRESSION: 1. Acute bilateral pulmonary emboli within the right main pulmonary artery; right upper and right lower lobar arteries, extending into right upper and lower lobe segmental branches; and left lower lobe segmental arteries. 2. No CT evidence of right heart strain. 3. Posterior right upper lobe peripheral opacities likely indicate an area of infarcted lung. Critical Value/emergent results were called by telephone at the time of interpretation on 02/20/2017 at 5:51 am to Dr. Geoffery Lyons , who verbally acknowledged these results. Electronically Signed   By: Deatra Robinson M.D.   On: 02/20/2017 05:58      Lab Results  Component Value Date   HGBA1C 5.8 (H) 02/20/2017   HGBA1C  06/13/2010    5.6 (NOTE)                                                                       According to the ADA Clinical Practice Recommendations for 2011, when HbA1c is used as a screening test:   >=6.5%   Diagnostic of Diabetes Mellitus           (if abnormal result  is confirmed)  5.7-6.4%   Increased risk of developing Diabetes Mellitus  References:Diagnosis and Classification of Diabetes Mellitus,Diabetes Care,2011,34(Suppl 1):S62-S69 and Standards of Medical Care in         Diabetes - 2011,Diabetes Care,2011,34  (Suppl 1):S11-S61.   Lab Results  Component Value Date   LDLCALC 115 08/21/2016   CREATININE 1.20 02/20/2017       Scheduled Meds: . elvitegravir-cobicistat-emtricitabine-tenofovir  1 tablet Oral Q breakfast  . lisinopril  20 mg Oral BID  . pantoprazole  40 mg Oral Daily  . sodium chloride flush  3 mL Intravenous Q12H  . torsemide  20 mg Oral Daily   Continuous Infusions: . heparin 1,950 Units/hr (02/21/17 0511)     LOS: 0 days    Time spent: >30  MINS    Richarda Overlie  [facility] Pager [phone]. If 7PM-7AM, please contact night-coverage at [email or web address], password [facility] 02/21/2017, 7:45 AM  LOS: 0 days

## 2017-02-21 NOTE — Care Management Obs Status (Signed)
MEDICARE OBSERVATION STATUS NOTIFICATION   Patient Details  Name: Devin [name] MRN: [id number] Date of Birth: 1961/05/03   Medicare Observation Status Notification Given:  Yes    Darrold Span, RN 02/21/2017, 4:34 PM

## 2017-02-21 NOTE — Care Management Note (Signed)
Case Management Note Donn Pierini RN, BSN Unit 2W-Case Manager [phone]  Patient Details  Name: Devin [name] MRN: [id number] Date of Birth: 1961-02-03  Subjective/Objective:  Pt presented with PE/DVT                  Action/Plan: PTA pt lived at home with SO- tammy-  Per MD plan for Xarelto at discharge- benefits check completed- spoke with pt and SO at bedside- per conversation pt uses both his medicare and Medicaid benefits for medications- coverage info shared- and informed where they can fill Xarelto starter pack if ordered ([facility])- 30 day free card given to use on discharge.   Expected Discharge Date:                  Expected Discharge Plan:  Home/Self Care  In-House Referral:     Discharge planning Services  CM Consult, Medication Assistance  Post Acute Care Choice:    Choice offered to:     DME Arranged:    DME Agency:     HH Arranged:    HH Agency:     Status of Service:  In process, will continue to follow  If discussed at Long Length of Stay Meetings, dates discussed:    Discharge Disposition:   Additional Comments:  Darrold Span, RN 02/21/2017, 4:34 PM

## 2017-02-21 NOTE — Progress Notes (Signed)
ANTICOAGULATION CONSULT NOTE - FOLLOW UP    HL = 0.32 (goal 0.3 - 0.7 units/mL) Heparin dosing weight = 90 kg   Assessment: 55 YOM with new, extensive bilateral PE and RLE DVT to continue on IV heparin.  Heparin level is therapeutic and toward the low end of normal.  No bleeding reported.   Plan: - Increase heparin gtt to 2150 units/hr - F/U AM labs    Rumeal Cullipher D. Laney Potash, PharmD, BCPS 02/21/2017, 8:33 PM

## 2017-02-21 NOTE — Progress Notes (Signed)
Pt states he is still having 10/10 back pain despite PRN pain medications given.  NP Craige Cotta notified, new orders received and implemented.  RN will continue to monitor pt closely.  Erenest Rasher, RN

## 2017-02-21 NOTE — Progress Notes (Signed)
Per insurance check for Xarelto # 1.  S/W ANNA  @ [employer] [facility] # [phone]   XARELTO  20 MG  DAILY   COVER- YES  CO-PAY- 25 % OF TOTAL COAST Q/L ONE PILL PER DAY  TIER- 3 DRUG  PRIOR APPROVAL- NO   PHARMACY : [facility] [place] [facility]  [facility]   PATIENT HAS MEDICAID ALSO  CO-PAY- $ 3.70

## 2017-02-22 DIAGNOSIS — I2699 Other pulmonary embolism without acute cor pulmonale: Principal | ICD-10-CM

## 2017-02-22 LAB — COMPREHENSIVE METABOLIC PANEL
ALK PHOS: 52 U/L (ref 38–126)
ALT: 12 U/L — ABNORMAL LOW (ref 17–63)
ANION GAP: 9 (ref 5–15)
AST: 19 U/L (ref 15–41)
Albumin: 3.2 g/dL — ABNORMAL LOW (ref 3.5–5.0)
BUN: 16 mg/dL (ref 6–20)
CALCIUM: 9.5 mg/dL (ref 8.9–10.3)
CO2: 28 mmol/L (ref 22–32)
CREATININE: 1.43 mg/dL — AB (ref 0.61–1.24)
Chloride: 101 mmol/L (ref 101–111)
GFR calc Af Amer: >60 mL/min (ref >60)
GFR, EST NON AFRICAN AMERICAN: 55 mL/min — AB (ref >60)
Glucose, Bld: 108 mg/dL — ABNORMAL HIGH (ref 65–99)
Potassium: 4 mmol/L (ref 3.5–5.1)
SODIUM: 138 mmol/L (ref 135–145)
Total Bilirubin: 0.3 mg/dL (ref 0.3–1.2)
Total Protein: 7.1 g/dL (ref 6.5–8.1)

## 2017-02-22 LAB — CBC
HCT: 37.9 % — ABNORMAL LOW (ref 39.0–52.0)
Hemoglobin: 13 g/dL (ref 13.0–17.0)
MCH: 33.1 pg (ref 26.0–34.0)
MCHC: 34.3 g/dL (ref 30.0–36.0)
MCV: 96.4 fL (ref 78.0–100.0)
Platelets: 214 K/uL (ref 150–400)
RBC: 3.93 MIL/uL — ABNORMAL LOW (ref 4.22–5.81)
RDW: 11.7 % (ref 11.5–15.5)
WBC: 6.8 K/uL (ref 4.0–10.5)

## 2017-02-22 LAB — HEPARIN LEVEL (UNFRACTIONATED): Heparin Unfractionated: 0.4 IU/mL (ref 0.30–0.70)

## 2017-02-22 LAB — GLUCOSE, CAPILLARY: GLUCOSE-CAPILLARY: 123 mg/dL — AB (ref 65–99)

## 2017-02-22 MED ORDER — RIVAROXABAN 15 MG PO TABS: 15.0000 mg | ORAL_TABLET | Freq: Two times a day (BID) | ORAL | 0 refills | Status: DC

## 2017-02-22 MED ORDER — RIVAROXABAN 15 MG PO TABS
15.0000 mg | ORAL_TABLET | Freq: Two times a day (BID) | ORAL | Status: DC
  Administered 2017-02-22: 15 mg via ORAL
  Filled 2017-02-22: qty 1

## 2017-02-22 MED ORDER — LEVOFLOXACIN 750 MG PO TABS: 750.0000 mg | ORAL_TABLET | Freq: Every day | ORAL | 0 refills | Status: DC

## 2017-02-22 MED ORDER — PANTOPRAZOLE SODIUM 40 MG PO TBEC: 40.0000 mg | DELAYED_RELEASE_TABLET | Freq: Every day | ORAL | 1 refills | Status: DC

## 2017-02-22 MED ORDER — RIVAROXABAN 20 MG PO TABS: 20.0000 mg | ORAL_TABLET | Freq: Every day | ORAL | 3 refills | Status: DC

## 2017-02-22 MED ORDER — NICOTINE 21 MG/24HR TD PT24: 21.0000 mg | MEDICATED_PATCH | Freq: Every day | TRANSDERMAL | 0 refills | Status: DC

## 2017-02-22 MED ORDER — RIVAROXABAN 20 MG PO TABS: 20.0000 mg | ORAL_TABLET | Freq: Every day | ORAL | Status: DC

## 2017-02-22 MED ORDER — OXYCODONE HCL ER 15 MG PO T12A: 15.0000 mg | EXTENDED_RELEASE_TABLET | Freq: Two times a day (BID) | ORAL | 0 refills | Status: DC

## 2017-02-22 NOTE — Progress Notes (Signed)
Pt discharged to  Personal home via personal vehicle. Reviewed discharge instructions with pt and pt's wife. Pt and pt's wife verbalized understanding.   Laquita Harlan M

## 2017-02-22 NOTE — Progress Notes (Signed)
ANTICOAGULATION CONSULT NOTE - Follow Up Consult  Pharmacy Consult for Heparin --> Xarelto Indication: pulmonary embolus and DVT  No Known Allergies  Patient Measurements: Height:  (175.3 cm) Weight: 211 lb 6.7 oz (95.9 kg) IBW/kg (Calculated) : 70.7 Heparin Dosing Weight: 90kg  Vital Signs: Temp: 98.3 F (36.8 C) (04/28 0459) Temp Source: Oral (04/28 0459) BP: 94/60 (04/28 0459) Pulse Rate: 91 (04/28 0459)  Labs:  Recent Labs  02/20/17 0130  02/21/17 0836 02/21/17 1935 02/22/17 0227  HGB 15.1  --  15.0  --  13.0  HCT 43.6  --  42.8  --  37.9*  PLT 223  --  260  --  214  LABPROT  --   --  14.2  --   --   INR  --   --  1.10  --   --   HEPARINUNFRC  --   < > 0.37 0.32 0.40  CREATININE 1.20  --  1.50*  --  1.43*  < > = values in this interval not displayed.  Estimated Creatinine Clearance: 68.3 mL/min (A) (by C-G formula based on SCr of 1.43 mg/dL (H)).   Medications:  Heparin @ 2150 units/hr  Assessment: 55yom on heparin for extensive bilateral PE and RLE DVT now to transition to xarelto. CBC wnl. No bleeding.  Goal of Therapy:  Monitor platelets by anticoagulation protocol: Yes   Plan:  1) Xarelto  po bid x 21 days then  po daily 2) Will provide education  Fredrik Rigger 02/22/2017,10:22 AM

## 2017-02-22 NOTE — Discharge Summary (Signed)
Physician Discharge Summary  Devin [name] MRN: [id number] DOB/AGE: 04/22/1961 55 y.o.  PCP: Stephen D Knowlton, MD   Admit date: 02/20/2017 Discharge date: 02/22/2017  Discharge Diagnoses:    Active Problems:   Human immunodeficiency virus (HIV) disease (HCC)   Tobacco use disorder   LOW BACK PAIN   CHEST PAIN   Essential hypertension, benign   Pulmonary emboli (HCC)   Pulmonary infarct (HCC)    Follow-up recommendations Follow-up with PCP in 3-5 days , including all  additional recommended appointments as below Follow-up CBC, CMP in 3-5 days Patient needs outpatient echocardiogram in the next 1-2 weeks to further evaluate the swelling in his legs Plan of care discussed with girlfriend and father     Current Discharge Medication List    START taking these medications   Details  levofloxacin (LEVAQUIN) 750 MG tablet Take 1 tablet (750 mg total) by mouth daily. Qty: 5 tablet, Refills: 0    nicotine (NICODERM CQ - DOSED IN MG/24 HOURS) 21 mg/24hr patch Place 1 patch (21 mg total) onto the skin daily. Qty: 28 patch, Refills: 0    oxyCODONE (OXYCONTIN) 15 mg 12 hr tablet Take 1 tablet (15 mg total) by mouth every 12 (twelve) hours. Qty: 14 tablet, Refills: 0    pantoprazole (PROTONIX) 40 MG tablet Take 1 tablet (40 mg total) by mouth daily. Qty: 30 tablet, Refills: 1    !! Rivaroxaban (XARELTO) 15 MG TABS tablet Take 1 tablet (15 mg total) by mouth 2 (two) times daily with a meal. Qty: 42 tablet, Refills: 0    !! rivaroxaban (XARELTO) 20 MG TABS tablet Take 1 tablet (20 mg total) by mouth daily with supper. Qty: 30 tablet, Refills: 3     !! - Potential duplicate medications found. Please discuss with provider.    CONTINUE these medications which have NOT CHANGED   Details  albuterol (PROVENTIL HFA;VENTOLIN HFA) 108 (90 BASE) MCG/ACT inhaler Inhale 2 puffs into the lungs every 4 (four) hours as needed for wheezing or shortness of breath.    albuterol (PROVENTIL)  (2.5 MG/3ML) 0.083% nebulizer solution Take 2.5 mg by nebulization every 6 (six) hours as needed for wheezing or shortness of breath.    amitriptyline (ELAVIL) 50 MG tablet Take 50 mg by mouth at bedtime as needed for sleep.     COMBIVENT RESPIMAT 20-100 MCG/ACT AERS respimat Inhale 2 puffs into the lungs every 4 (four) hours as needed. wheezing    cyclobenzaprine (FLEXERIL) 10 MG tablet Take 10 mg by mouth 3 (three) times daily as needed for muscle spasms.    diazepam (VALIUM) 10 MG tablet Take 10 mg by mouth 3 (three) times daily as needed for anxiety.    Associated Diagnoses: Lumbago    elvitegravir-cobicistat-emtricitabine-tenofovir (GENVOYA) 150-150-200-10 MG TABS tablet Take 1 tablet by mouth daily with breakfast. Qty: 90 tablet, Refills: 3   Associated Diagnoses: Human immunodeficiency virus (HIV) disease (HCC)    gabapentin (NEURONTIN) 600 MG tablet Take 600 mg by mouth every 4 (four) hours as needed (pain).    omeprazole (PRILOSEC) 40 MG capsule Take 40 mg by mouth daily.      oxyCODONE-acetaminophen (PERCOCET/ROXICET) 5-325 MG per tablet Take 1 tablet by mouth every 4 (four) hours as needed for severe pain. Qty: 20 tablet, Refills: 0    torsemide (DEMADEX) 20 MG tablet Take 20 mg by mouth daily.      STOP taking these medications     HYDROcodone-acetaminophen (NORCO) 10-325 MG per tablet        ibuprofen (ADVIL,MOTRIN) 600 MG tablet      lisinopril (PRINIVIL,ZESTRIL) 20 MG tablet      Multiple Vitamin (MULTIVITAMIN WITH MINERALS) TABS tablet          Discharge Condition: *Stable   Discharge Instructions Get Medicines reviewed and adjusted: Please take all your medications with you for your next visit with your Primary MD  Please request your Primary MD to go over all hospital tests and procedure/radiological results at the follow up, please ask your Primary MD to get all Hospital records sent to his/her office.  If you experience worsening of your admission  symptoms, develop shortness of breath, life threatening emergency, suicidal or homicidal thoughts you must seek medical attention immediately by calling 911 or calling your MD immediately if symptoms less severe.  You must read complete instructions/literature along with all the possible adverse reactions/side effects for all the Medicines you take and that have been prescribed to you. Take any new Medicines after you have completely understood and accpet all the possible adverse reactions/side effects.   Do not drive when taking Pain medications.   Do not take more than prescribed Pain, Sleep and Anxiety Medications  Special Instructions: If you have smoked or chewed Tobacco in the last 2 yrs please stop smoking, stop any regular Alcohol and or any Recreational drug use.  Wear Seat belts while driving.  Please note  You were cared for by a hospitalist during your hospital stay. Once you are discharged, your primary care physician will handle any further medical issues. Please note that NO REFILLS for any discharge medications will be authorized once you are discharged, as it is imperative that you return to your primary care physician (or establish a relationship with a primary care physician if you do not have one) for your aftercare needs so that they can reassess your need for medications and monitor your lab values.  Discharge Instructions    Diet - low sodium heart healthy    Complete by:  As directed    Increase activity slowly    Complete by:  As directed        No Known Allergies    Disposition: 01-Home or Self Care   Consults:  None    Significant Diagnostic Studies:  X-ray Chest Pa And Lateral  Result Date: 02/21/2017 CLINICAL DATA:  Pulmonary embolism.  Chest discomfort. EXAM: CHEST  2 VIEW COMPARISON:  02/20/2017 chest radiograph and 02/20/2017 CT. FINDINGS: Cardiomegaly. Bibasilar atelectasis, improved from 04/26. Peripheral opacity in the RIGHT upper lobe  correlating with prior CT likely indicative of pulmonary infarction. No effusion or pneumothorax. Bones unremarkable. IMPRESSION: Findings consistent with a RIGHT upper lobe pulmonary infarction. This is slightly more pronounced than was seen on 02/20/2017. Bibasilar atelectasis is improved. Cardiomegaly. Electronically Signed   By: John T Curnes M.D.   On: 02/21/2017 10:49   Dg Chest 2 View  Result Date: 02/20/2017 CLINICAL DATA:  Acute onset of right-sided chest pain, shortness of breath and cough. Initial encounter. EXAM: CHEST  2 VIEW COMPARISON:  Chest radiograph performed 12/03/2016 FINDINGS: Left basilar airspace opacity is concerning for pneumonia. Bibasilar atelectasis or scarring is seen. No definite pleural effusion or pneumothorax is identified. The heart is normal in size. No acute osseous abnormalities are identified. IMPRESSION: 1. Left basilar airspace opacity is concerning for pneumonia. 2. Underlying bibasilar atelectasis or scarring seen. Electronically Signed   By: Jeffery  Chang M.D.   On: 02/20/2017 01:55   Ct Angio Chest Pe W Or Wo   Contrast  Result Date: 02/20/2017 CLINICAL DATA:  Right-sided chest pain EXAM: CT ANGIOGRAPHY CHEST WITH CONTRAST TECHNIQUE: Multidetector CT imaging of the chest was performed using the standard protocol during bolus administration of intravenous contrast. Multiplanar CT image reconstructions and MIPs were obtained to evaluate the vascular anatomy. CONTRAST:  78 mL Isovue 370 COMPARISON:  Chest radiograph 02/20/2017 FINDINGS: Cardiovascular: Contrast injection is sufficient to demonstrate satisfactory opacification of the pulmonary arteries to the segmental level. There is a central filling defect within the distal right main pulmonary artery that extends into the right lower lobar artery and the right upper lobar artery. The right middle lobe arteries are spared. The emboli extend all the way into the most distal aspect of the right lower lobe segmental  branches. On the left, there are filling defects within the left lower lobe segmental branches. The main pulmonary artery is within normal limits for size. There is no CT evidence of acute right heart strain. The visualized aorta is normal. There is a normal variant aortic arch branching pattern with the brachiocephalic and left common carotid arteries sharing a common origin. Heart size is normal, without pericardial effusion. Mediastinum/Nodes: No mediastinal, hilar or axillary lymphadenopathy. The visualized thyroid and thoracic esophageal course are unremarkable. Lungs/Pleura: Peripheral opacities in the posterior right upper lobe likely indicate pulmonary infarction. There is bibasilar subsegmental atelectasis. No pleural effusion. Upper Abdomen: Contrast bolus timing is not optimized for evaluation of the abdominal organs. Within this limitation, the visualized organs of the upper abdomen are normal. Musculoskeletal: No chest wall abnormality. No acute or significant osseous findings. Review of the MIP images confirms the above findings. IMPRESSION: 1. Acute bilateral pulmonary emboli within the right main pulmonary artery; right upper and right lower lobar arteries, extending into right upper and lower lobe segmental branches; and left lower lobe segmental arteries. 2. No CT evidence of right heart strain. 3. Posterior right upper lobe peripheral opacities likely indicate an area of infarcted lung. Critical Value/emergent results were called by telephone at the time of interpretation on 02/20/2017 at 5:51 am to Dr. Veryl Speak , who verbally acknowledged these results. Electronically Signed   By: Ulyses Jarred M.D.   On: 02/20/2017 05:58        Filed Weights   02/21/17 0452 02/22/17 0459  Weight: 95.2 kg (209 lb 14.1 oz) 95.9 kg (211 lb 6.7 oz)     Microbiology: No results found for this or any previous visit (from the past 240 hour(s)).     Blood Culture No results found for: SDES,  [place], CULT, REPTSTATUS    Labs: Results for orders placed or performed during the hospital encounter of 02/20/17 (from the past 48 hour(s))  Heparin level (unfractionated)     Status: Abnormal   Collection Time: 02/20/17  1:53 PM  Result Value Ref Range   Heparin Unfractionated 0.19 (L) 0.30 - 0.70 IU/mL    Comment:        IF HEPARIN RESULTS ARE BELOW EXPECTED VALUES, AND PATIENT DOSAGE HAS BEEN CONFIRMED, SUGGEST FOLLOW UP TESTING OF ANTITHROMBIN III LEVELS.   Heparin level (unfractionated)     Status: None   Collection Time: 02/20/17  9:23 PM  Result Value Ref Range   Heparin Unfractionated 0.31 0.30 - 0.70 IU/mL    Comment:        IF HEPARIN RESULTS ARE BELOW EXPECTED VALUES, AND PATIENT DOSAGE HAS BEEN CONFIRMED, SUGGEST FOLLOW UP TESTING OF ANTITHROMBIN III LEVELS.   Urinalysis, Routine w reflex microscopic  Status: Abnormal   Collection Time: 02/21/17  4:58 AM  Result Value Ref Range   Color, Urine AMBER (A) YELLOW    Comment: BIOCHEMICALS MAY BE AFFECTED BY COLOR   APPearance CLEAR CLEAR   Specific Gravity, Urine 1.042 (H) 1.005 - 1.030   pH 5.0 5.0 - 8.0   Glucose, UA NEGATIVE NEGATIVE mg/dL   Hgb urine dipstick NEGATIVE NEGATIVE   Bilirubin Urine NEGATIVE NEGATIVE   Ketones, ur NEGATIVE NEGATIVE mg/dL   Protein, ur 30 (A) NEGATIVE mg/dL   Nitrite NEGATIVE NEGATIVE   Leukocytes, UA NEGATIVE NEGATIVE   RBC / HPF 0-5 0 - 5 RBC/hpf   WBC, UA 0-5 0 - 5 WBC/hpf   Bacteria, UA NONE SEEN NONE SEEN   Squamous Epithelial / LPF NONE SEEN NONE SEEN   Mucous PRESENT   Glucose, capillary     Status: Abnormal   Collection Time: 02/21/17  5:03 AM  Result Value Ref Range   Glucose-Capillary 178 (H) 65 - 99 mg/dL  Heparin level (unfractionated)     Status: None   Collection Time: 02/21/17  8:36 AM  Result Value Ref Range   Heparin Unfractionated 0.37 0.30 - 0.70 IU/mL    Comment:        IF HEPARIN RESULTS ARE BELOW EXPECTED VALUES, AND PATIENT DOSAGE HAS  BEEN CONFIRMED, SUGGEST FOLLOW UP TESTING OF ANTITHROMBIN III LEVELS.   CBC     Status: Abnormal   Collection Time: 02/21/17  8:36 AM  Result Value Ref Range   WBC 11.2 (H) 4.0 - 10.5 K/uL   RBC 4.45 4.22 - 5.81 MIL/uL   Hemoglobin 15.0 13.0 - 17.0 g/dL   HCT 42.8 39.0 - 52.0 %   MCV 96.2 78.0 - 100.0 fL   MCH 33.7 26.0 - 34.0 pg   MCHC 35.0 30.0 - 36.0 g/dL   RDW 11.7 11.5 - 15.5 %   Platelets 260 150 - 400 K/uL  Comprehensive metabolic panel     Status: Abnormal   Collection Time: 02/21/17  8:36 AM  Result Value Ref Range   Sodium 136 135 - 145 mmol/L   Potassium 3.8 3.5 - 5.1 mmol/L   Chloride 103 101 - 111 mmol/L   CO2 22 22 - 32 mmol/L   Glucose, Bld 113 (H) 65 - 99 mg/dL   BUN 18 6 - 20 mg/dL   Creatinine, Ser 1.50 (H) 0.61 - 1.24 mg/dL   Calcium 9.5 8.9 - 10.3 mg/dL   Total Protein 8.3 (H) 6.5 - 8.1 g/dL   Albumin 3.8 3.5 - 5.0 g/dL   AST 23 15 - 41 U/L   ALT 13 (L) 17 - 63 U/L   Alkaline Phosphatase 54 38 - 126 U/L   Total Bilirubin 0.4 0.3 - 1.2 mg/dL   GFR calc non Af Amer 51 (L) >60 mL/min   GFR calc Af Amer 60 (L) >60 mL/min    Comment: (NOTE) The eGFR has been calculated using the CKD EPI equation. This calculation has not been validated in all clinical situations. eGFR's persistently <60 mL/min signify possible Chronic Kidney Disease.    Anion gap 11 5 - 15  Protime-INR     Status: None   Collection Time: 02/21/17  8:36 AM  Result Value Ref Range   Prothrombin Time 14.2 11.4 - 15.2 seconds   INR 1.10   Heparin level (unfractionated)     Status: None   Collection Time: 02/21/17  7:35 PM  Result Value Ref Range  Heparin Unfractionated 0.32 0.30 - 0.70 IU/mL    Comment:        IF HEPARIN RESULTS ARE BELOW EXPECTED VALUES, AND PATIENT DOSAGE HAS BEEN CONFIRMED, SUGGEST FOLLOW UP TESTING OF ANTITHROMBIN III LEVELS.   Heparin level (unfractionated)     Status: None   Collection Time: 02/22/17  2:27 AM  Result Value Ref Range   Heparin  Unfractionated 0.40 0.30 - 0.70 IU/mL    Comment:        IF HEPARIN RESULTS ARE BELOW EXPECTED VALUES, AND PATIENT DOSAGE HAS BEEN CONFIRMED, SUGGEST FOLLOW UP TESTING OF ANTITHROMBIN III LEVELS.   CBC     Status: Abnormal   Collection Time: 02/22/17  2:27 AM  Result Value Ref Range   WBC 6.8 4.0 - 10.5 K/uL   RBC 3.93 (L) 4.22 - 5.81 MIL/uL   Hemoglobin 13.0 13.0 - 17.0 g/dL   HCT 37.9 (L) 39.0 - 52.0 %   MCV 96.4 78.0 - 100.0 fL   MCH 33.1 26.0 - 34.0 pg   MCHC 34.3 30.0 - 36.0 g/dL   RDW 11.7 11.5 - 15.5 %   Platelets 214 150 - 400 K/uL  Comprehensive metabolic panel     Status: Abnormal   Collection Time: 02/22/17  2:27 AM  Result Value Ref Range   Sodium 138 135 - 145 mmol/L   Potassium 4.0 3.5 - 5.1 mmol/L   Chloride 101 101 - 111 mmol/L   CO2 28 22 - 32 mmol/L   Glucose, Bld 108 (H) 65 - 99 mg/dL   BUN 16 6 - 20 mg/dL   Creatinine, Ser 1.43 (H) 0.61 - 1.24 mg/dL   Calcium 9.5 8.9 - 10.3 mg/dL   Total Protein 7.1 6.5 - 8.1 g/dL   Albumin 3.2 (L) 3.5 - 5.0 g/dL   AST 19 15 - 41 U/L   ALT 12 (L) 17 - 63 U/L   Alkaline Phosphatase 52 38 - 126 U/L   Total Bilirubin 0.3 0.3 - 1.2 mg/dL   GFR calc non Af Amer 55 (L) >60 mL/min   GFR calc Af Amer >60 >60 mL/min    Comment: (NOTE) The eGFR has been calculated using the CKD EPI equation. This calculation has not been validated in all clinical situations. eGFR's persistently <60 mL/min signify possible Chronic Kidney Disease.    Anion gap 9 5 - 15  Glucose, capillary     Status: Abnormal   Collection Time: 02/22/17  6:04 AM  Result Value Ref Range   Glucose-Capillary 123 (H) 65 - 99 mg/dL   Comment 1 Notify RN      Lipid Panel     Component Value Date/Time   CHOL 203 (H) 08/21/2016 1602   TRIG 292 (H) 08/21/2016 1602   HDL 30 (L) 08/21/2016 1602   CHOLHDL 6.8 (H) 08/21/2016 1602   VLDL 58 (H) 08/21/2016 1602   LDLCALC 115 08/21/2016 1602     Lab Results  Component Value Date   HGBA1C 5.8 (H) 02/20/2017    HGBA1C  06/13/2010    5.6 (NOTE)                                                                       According to the   ADA Clinical Practice Recommendations for 2011, when HbA1c is used as a screening test:   >=6.5%   Diagnostic of Diabetes Mellitus           (if abnormal result  is confirmed)  5.7-6.4%   Increased risk of developing Diabetes Mellitus  References:Diagnosis and Classification of Diabetes Mellitus,Diabetes IBBC,4888,91(QXIHW 1):S62-S69 and Standards of Medical Care in         Diabetes - 2011,Diabetes TUUE,2800,34  (Suppl 1):S11-S61.     Lab Results  Component Value Date   LDLCALC 115 08/21/2016   CREATININE 1.43 (H) 02/22/2017     HPI :56 y.o.maleWith a history of HIV +followed by ID Dr. Johnnye Sima, hypertension, COPD, peripheral neuropathy, anxiety, chronic back pain in the setting of spondylosis, presenting with 2-3 day history of R sided Chest pain, cough when lying down, productive yellow sputum, accompanied by shortness of breath with deep inspiration . Admitted for bilateral PE and DVT  Hospital course Pulmonary Embolism, Bilateral.  Admitted w SOB/ R sided CP Sats 90s in RA . Abnormal CTA with R>LPE withoutR heart strain. Tn at 0. EKG SR, normal . Placed on Heparin drip 48 hours. Risk factors include decreased mobility due to chronic back pain and cigarette smoking WBC 11.5 likely reactive Telemetry uneventful Currently on room air Discussed with Dr Chase Caller , PESI score is low , no need for vascular intervention Patient has been transitioned to Xarelto Bilateral lower extremity US -shows right lower extremity DVT Tobacco cessation counseled. Will give Nicotine patch  OxyContin to better control his chest pain and back pain  AKI Cr increased from 1.2 to 1.5 Hold lisinopril, can continue Demadex for bilateral lower extremity swelling  Leukocytosis, likely reactive versus secondary to pulmonary infarction Low-grade fever Blood cultures no growth  so far Small area of pulmonary infarction posterior right upper lobe, will start patient on levofloxacin Continue albuterol nebs for COPD   HIV positive,followed by Dr. Johnnye Sima. 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 No acute issues  Continue home meds and Neurontin dose decreased due to AKI   Elevated glucose,currently at 156. No history of DM. No recent steroids  Hemoglobin A1c 5.8  Depression/ Anxiety Continue Elavil and Diazepam     Discharge Exam:  Blood pressure 94/60, pulse 91, temperature 98.3 F (36.8 C), temperature source Oral, resp. rate 18, height 5' 9" (1.753 m), weight 95.9 kg (211 lb 6.7 oz), SpO2 94 %.  General exam: Appears calm and comfortable  Respiratory system: Clear to auscultation. Respiratory effort normal. Cardiovascular system: S1 & S2 heard, RRR. No JVD, murmurs, rubs, gallops or clicks. No pedal edema. Gastrointestinal system: Abdomen is nondistended, soft and nontender. No organomegaly or masses felt. Normal bowel sounds heard. Central nervous system: Alert and oriented. No focal neurological deficits. Extremities: Symmetric 5 x 5 power. Skin: No rashes, lesions or ulcers Psychiatry: Judgement and insight appear normal. Mood & affect appropriate      Signed: Reyne Dumas 02/22/2017, 11:19 AM        Time spent >45 mins

## 2017-02-22 NOTE — Discharge Instructions (Signed)
Information on my medicine - XARELTO (rivaroxaban)  This medication education was reviewed with me or my healthcare representative as part of my discharge preparation.  The pharmacist that spoke with me during my hospital stay was:  Fredrik Rigger, [facility]  WHY WAS Carlena Hurl PRESCRIBED FOR YOU? Xarelto was prescribed to treat blood clots that may have been found in the veins of your legs (deep vein thrombosis) or in your lungs (pulmonary embolism) and to reduce the risk of them occurring again.  What do you need to know about Xarelto? The starting dose is one 15 mg tablet taken TWICE daily with food for the FIRST 21 DAYS then on 03/15/2017  the dose is changed to one 20 mg tablet taken ONCE A DAY with your evening meal.  DO NOT stop taking Xarelto without talking to the health care provider who prescribed the medication.  Refill your prescription for 20 mg tablets before you run out.  After discharge, you should have regular check-up appointments with your healthcare provider that is prescribing your Xarelto.  In the future your dose may need to be changed if your kidney function changes by a significant amount.  What do you do if you miss a dose? If you are taking Xarelto TWICE DAILY and you miss a dose, take it as soon as you remember. You may take two 15 mg tablets (total 30 mg) at the same time then resume your regularly scheduled 15 mg twice daily the next day.  If you are taking Xarelto ONCE DAILY and you miss a dose, take it as soon as you remember on the same day then continue your regularly scheduled once daily regimen the next day. Do not take two doses of Xarelto at the same time.   Important Safety Information Xarelto is a blood thinner medicine that can cause bleeding. You should call your healthcare provider right away if you experience any of the following: ? Bleeding from an injury or your nose that does not stop. ? Unusual colored urine (red or dark brown) or unusual  colored stools (red or black). ? Unusual bruising for unknown reasons. ? A serious fall or if you hit your head (even if there is no bleeding).  Some medicines may interact with Xarelto and might increase your risk of bleeding while on Xarelto. To help avoid this, consult your healthcare provider or pharmacist prior to using any new prescription or non-prescription medications, including herbals, vitamins, non-steroidal anti-inflammatory drugs (NSAIDs) and supplements.  This website has more information on Xarelto: VisitDestination.com.br.

## 2017-04-01 ENCOUNTER — Ambulatory Visit: Payer: Medicare Other | Admitting: Infectious Diseases

## 2017-06-06 ENCOUNTER — Other Ambulatory Visit (HOSPITAL_COMMUNITY): Payer: Self-pay | Admitting: Neurological Surgery

## 2017-06-06 DIAGNOSIS — M4316 Spondylolisthesis, lumbar region: Secondary | ICD-10-CM

## 2017-06-16 ENCOUNTER — Ambulatory Visit (HOSPITAL_COMMUNITY)
Admission: RE | Admit: 2017-06-16 | Discharge: 2017-06-16 | Disposition: A | Discharge status: Home / Self Care | Payer: Medicare Other | Source: Ambulatory Visit | Attending: Neurological Surgery | Admitting: Neurological Surgery

## 2017-06-16 DIAGNOSIS — M48061 Spinal stenosis, lumbar region without neurogenic claudication: Secondary | ICD-10-CM | POA: Diagnosis not present

## 2017-06-16 DIAGNOSIS — M5126 Other intervertebral disc displacement, lumbar region: Secondary | ICD-10-CM | POA: Insufficient documentation

## 2017-06-16 DIAGNOSIS — M7138 Other bursal cyst, other site: Secondary | ICD-10-CM | POA: Insufficient documentation

## 2017-06-16 DIAGNOSIS — M4807 Spinal stenosis, lumbosacral region: Secondary | ICD-10-CM | POA: Insufficient documentation

## 2017-06-16 DIAGNOSIS — M5127 Other intervertebral disc displacement, lumbosacral region: Secondary | ICD-10-CM | POA: Diagnosis not present

## 2017-06-16 DIAGNOSIS — M4316 Spondylolisthesis, lumbar region: Secondary | ICD-10-CM

## 2017-06-18 IMAGING — DX DG CHEST 2V
2 series · 2 of 2 positions shown · non-contrast
Comparison: 08/28/2014

CLINICAL DATA: Coughing up blood since this morning, RIGHT-sided
chest pain, hypertension, COPD, scoliosis, smoker, HIV

EXAM:
CHEST  2 VIEW

[chest pa]
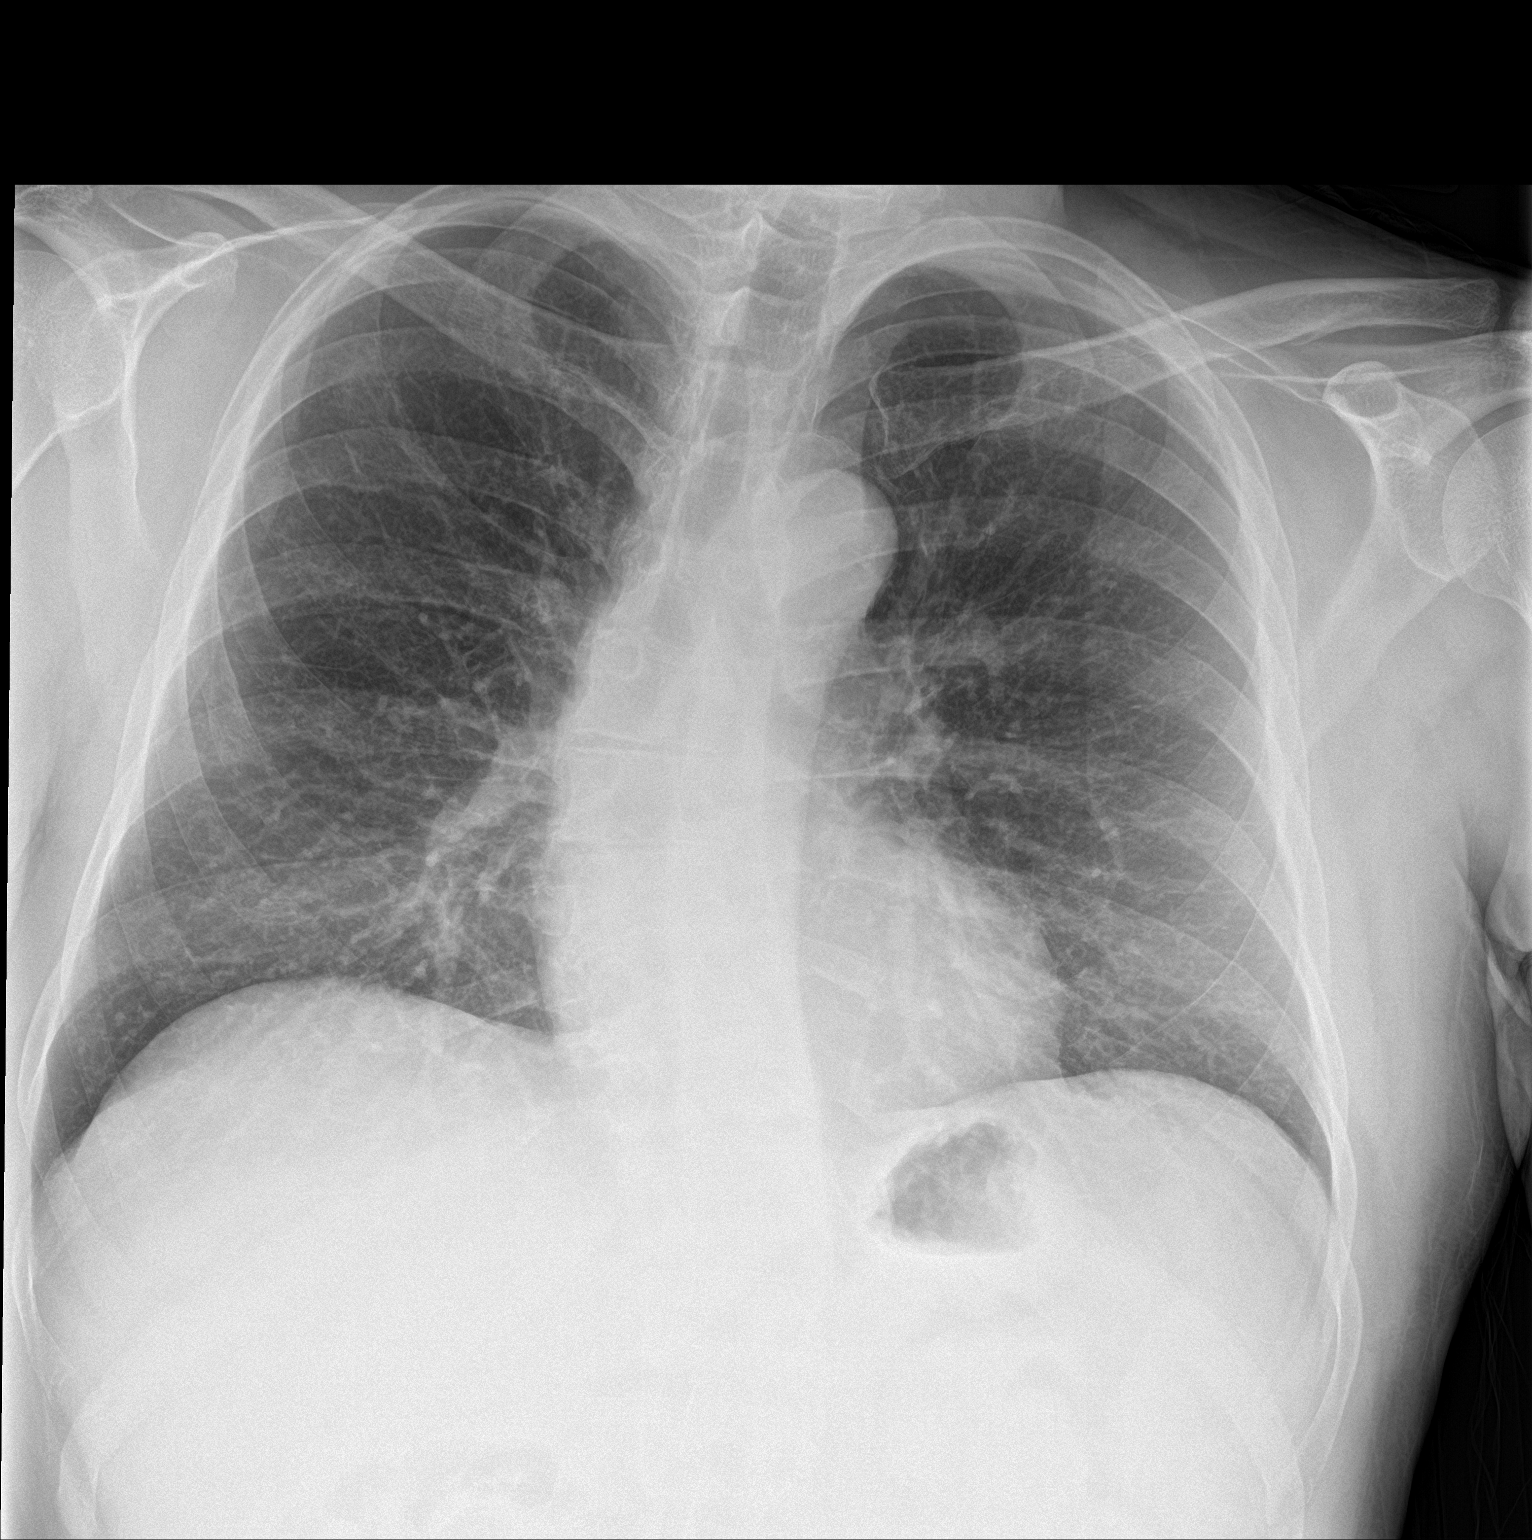

[chest lat]
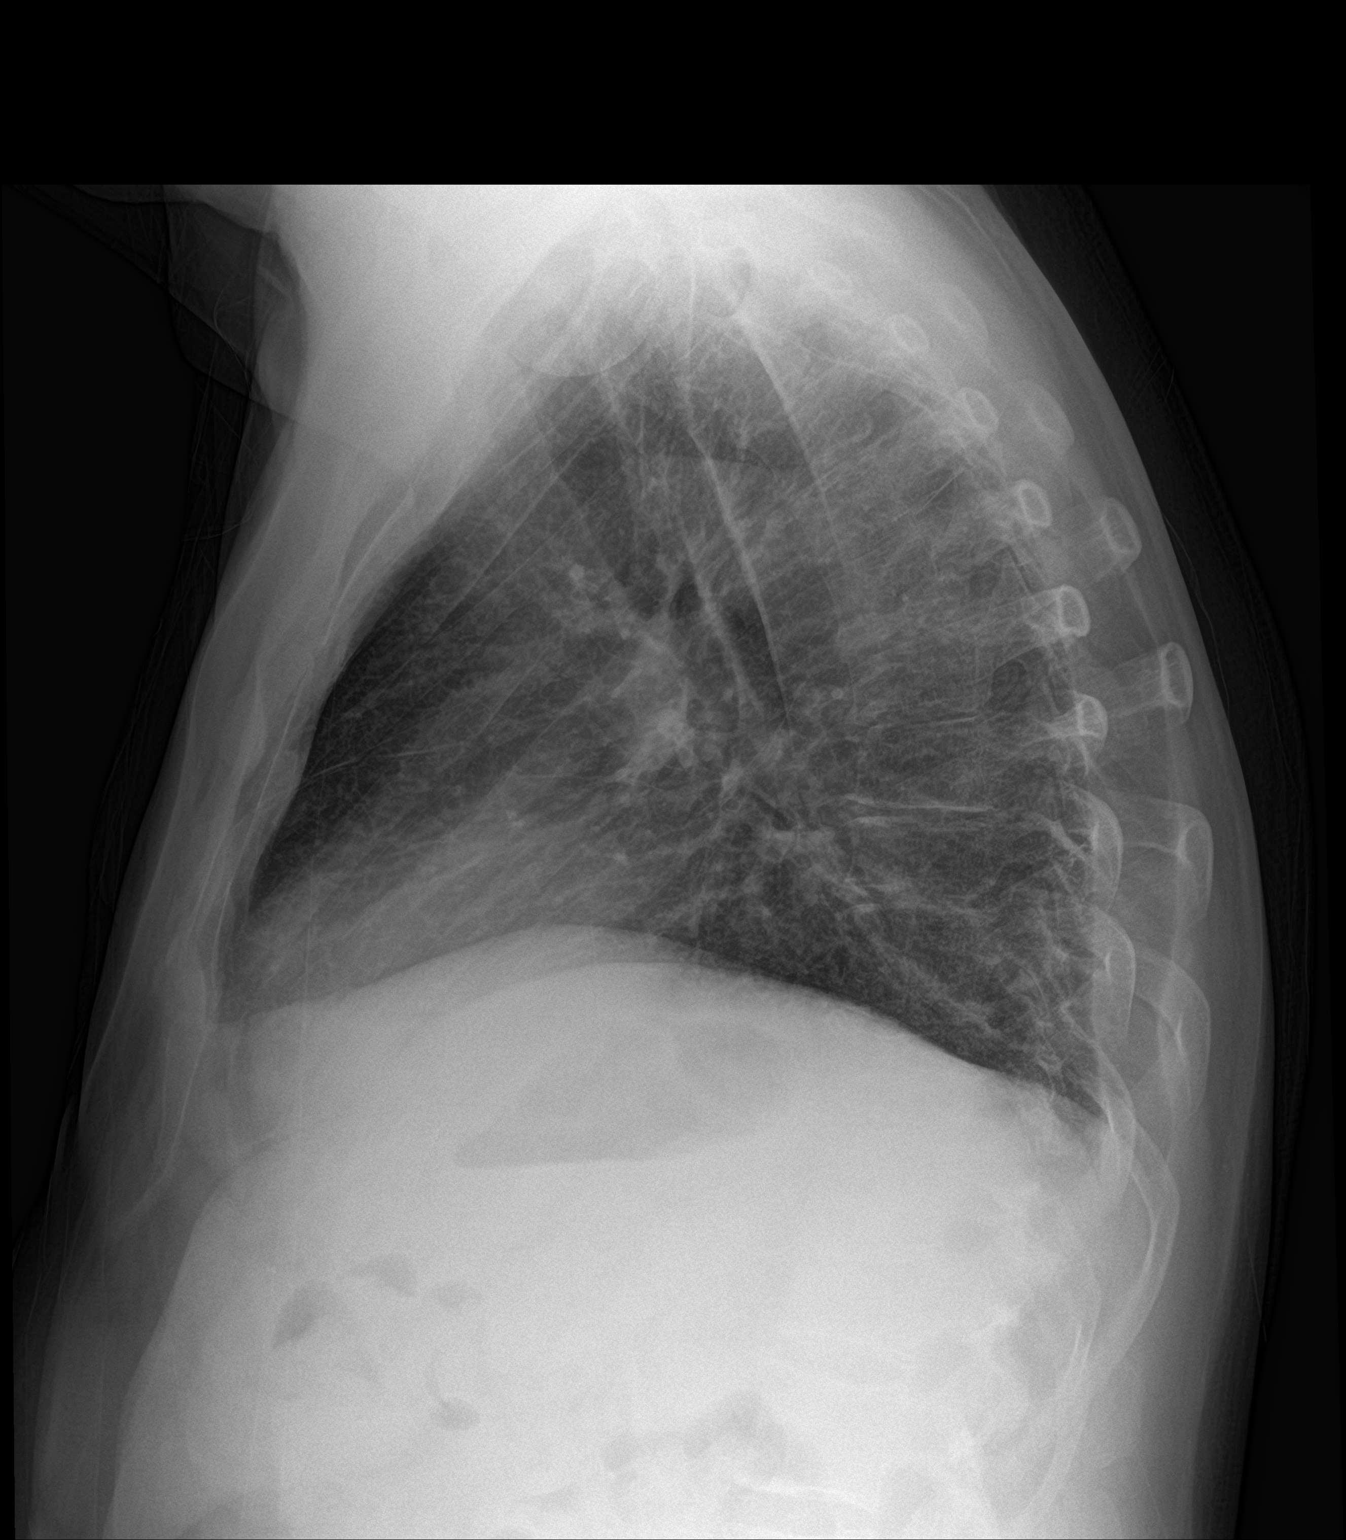

[2 of 2 positions shown; findings below may reference images not displayed]

FINDINGS: Normal heart size, mediastinal contours and pulmonary vascularity.

Minimal bronchitic changes and RIGHT basilar subsegmental
atelectasis.

Lungs otherwise clear.

No infiltrate, pleural effusion or pneumothorax.

Bones demineralized without acute osseous findings.
IMPRESSION: Minimal bronchitic changes and RIGHT basilar subsegmental
atelectasis.

## 2017-07-24 ENCOUNTER — Ambulatory Visit (HOSPITAL_COMMUNITY): Payer: Medicare Other

## 2017-08-21 ENCOUNTER — Encounter (HOSPITAL_COMMUNITY): Payer: Medicare Other | Attending: Oncology | Admitting: Oncology

## 2017-08-21 ENCOUNTER — Ambulatory Visit (HOSPITAL_COMMUNITY)
Admission: RE | Admit: 2017-08-21 | Discharge: 2017-08-21 | Disposition: A | Discharge status: Home / Self Care | Payer: Medicare Other | Source: Ambulatory Visit | Attending: Oncology | Admitting: Oncology

## 2017-08-21 ENCOUNTER — Encounter (HOSPITAL_COMMUNITY): Payer: Medicare Other | Attending: Oncology

## 2017-08-21 ENCOUNTER — Encounter (HOSPITAL_COMMUNITY): Payer: Self-pay

## 2017-08-21 VITALS — BP 139/96 | HR 96 | Temp 97.8°F | Resp 18 | Ht 72.0 in | Wt 215.5 lb

## 2017-08-21 DIAGNOSIS — Z7901 Long term (current) use of anticoagulants: Secondary | ICD-10-CM

## 2017-08-21 DIAGNOSIS — I82401 Acute embolism and thrombosis of unspecified deep veins of right lower extremity: Secondary | ICD-10-CM

## 2017-08-21 DIAGNOSIS — I2699 Other pulmonary embolism without acute cor pulmonale: Secondary | ICD-10-CM

## 2017-08-21 DIAGNOSIS — Z720 Tobacco use: Secondary | ICD-10-CM

## 2017-08-21 DIAGNOSIS — Z86711 Personal history of pulmonary embolism: Secondary | ICD-10-CM | POA: Insufficient documentation

## 2017-08-21 DIAGNOSIS — B20 Human immunodeficiency virus [HIV] disease: Secondary | ICD-10-CM

## 2017-08-21 LAB — CBC WITH DIFFERENTIAL/PLATELET
BASOS ABS: 0 10*3/uL (ref 0.0–0.1)
BASOS PCT: 0 %
Eosinophils Absolute: 0.2 10*3/uL (ref 0.0–0.7)
Eosinophils Relative: 4 %
HEMATOCRIT: 41.9 % (ref 39.0–52.0)
HEMOGLOBIN: 14.6 g/dL (ref 13.0–17.0)
LYMPHS PCT: 43 %
Lymphs Abs: 2.3 10*3/uL (ref 0.7–4.0)
MCH: 33.4 pg (ref 26.0–34.0)
MCHC: 34.8 g/dL (ref 30.0–36.0)
MCV: 95.9 fL (ref 78.0–100.0)
MONO ABS: 0.4 10*3/uL (ref 0.1–1.0)
Monocytes Relative: 8 %
NEUTROS ABS: 2.4 10*3/uL (ref 1.7–7.7)
NEUTROS PCT: 45 %
Platelets: 155 10*3/uL (ref 150–400)
RBC: 4.37 MIL/uL (ref 4.22–5.81)
RDW: 11.9 % (ref 11.5–15.5)
WBC: 5.3 10*3/uL (ref 4.0–10.5)

## 2017-08-21 LAB — COMPREHENSIVE METABOLIC PANEL
ALBUMIN: 4 g/dL (ref 3.5–5.0)
ALT: 24 U/L (ref 17–63)
ANION GAP: 10 (ref 5–15)
AST: 30 U/L (ref 15–41)
Alkaline Phosphatase: 50 U/L (ref 38–126)
BUN: 6 mg/dL (ref 6–20)
CALCIUM: 9.1 mg/dL (ref 8.9–10.3)
CO2: 26 mmol/L (ref 22–32)
Chloride: 101 mmol/L (ref 101–111)
Creatinine, Ser: 0.98 mg/dL (ref 0.61–1.24)
GFR calc non Af Amer: >60 mL/min (ref >60)
GLUCOSE: 139 mg/dL — AB (ref 65–99)
POTASSIUM: 3.6 mmol/L (ref 3.5–5.1)
SODIUM: 137 mmol/L (ref 135–145)
TOTAL PROTEIN: 7.3 g/dL (ref 6.5–8.1)
Total Bilirubin: 0.3 mg/dL (ref 0.3–1.2)

## 2017-08-21 MED ORDER — IOPAMIDOL (ISOVUE-370) INJECTION 76%
100.0000 mL | Freq: Once | INTRAVENOUS | Status: AC | PRN
  Administered 2017-08-21: 100 mL via INTRAVENOUS

## 2017-08-21 NOTE — Progress Notes (Signed)
[facility] Initial Visit:  Patient Care Team: Lemmie Evens, MD as PCP - General (Family Medicine) Johnnye Sima Doroteo Bradford, MD as PCP - Infectious Diseases (Infectious Diseases) Campbell Riches, MD (Infectious Diseases)  CHIEF COMPLAINTS/PURPOSE OF CONSULTATION:  Right leg DVT and bilateral Pes  HISTORY OF PRESENTING ILLNESS: Devin [name] 56 y.o. male presents for evaluation of PE.  Patient initially presented to [facility] ED on 02/20/2017 with severe pain on the right side of his chest.  He had a CT angiogram of the chest on 02/20/2017 which demonstrated acute bilateral pulmonary emboli within the right main pulmonary artery; right upper and right lower lobar arteries, extending into right upper and lower lobe segmental branches; and left lower lobe segmental arteries. No CT evidence of right heart strain. Posterior right upper lobe peripheral opacities likely indicate an area of infarcted lung.  He then had a Doppler venous ultrasound of bilateral lower extremities which demonstrated right leg positive for subacute DVT in the popliteal vein and posterior tibial and peroneal veins. Left leg, negative for deep and superficial vein thrombosis.   Patient was treated with a heparin gtt while inpatient and upon discharge on 02/22/17 he was transitioned to [place]. He has been on Xarelto since April and has not had any problems with [place] bleeding.  Patient states that prior to his diagnosis of PE/DVT, he was more sedentary due to the severity of his back pain and sciatica.  Patient had noticed swelling in his bilateral legs for a few weeks right worse than the left.  He denies any history of travel including road trips or air flights.  He does have a history of HIV but states that his viral load has been undetectable for years.  It appears that he may have had a hypercoagulable workup performed by his primary care physician, since his last PCP note stated patient elevated protein C and  Antithrombin III activity level, however I do not have any of these labs available to me. This was his first episode of PE/DVT. No family history of thrombophilia to his knowledge.  Today he states that he feels very debilitated by his back pain and sciatica. He has occasional swelling in his feet.  He states that he has occasional shortness of breath.  Denies any chest pain, palpitations, abdominal pain, constipation, diarrhea, nausea, vomiting, headaches, dizziness.  He smokes 1 pack/day.  Review of Systems - Oncology ROS as per HPI otherwise 12 point ROS is negative.  MEDICAL HISTORY: Past Medical History:  Diagnosis Date  . Anxiety   . Arthritis    "I'm eat up w/it" (02/20/2017)  . Asthma   . Chronic back pain    "the whole back" (02/20/2017)  . Chronic bronchitis ([place])   . Complication of anesthesia    "felt like I couldn't breath coming out of it"  . COPD (chronic obstructive pulmonary disease) ([place])   . Depression   . GERD (gastroesophageal reflux disease)   . History of hiatal hernia   . History of stomach ulcers    "bleeding ones; I was young then"  . HIV infection ([place]) dx'd ~ 1999  . Hypertension   . Pneumonia    "several times" (02/20/2017)  . Prolapsed disk 10/28  . Pulmonary embolism ([place]) 02/20/2017  . Scoliosis 08/24/13    SURGICAL HISTORY: Past Surgical History:  Procedure Laterality Date  . KNEE ARTHROSCOPY Right 1980s    SOCIAL HISTORY: Social History   Social History  . Marital  status: Divorced    Spouse name: N/A  . Number of children: N/A  . Years of education: N/A   Occupational History  . Not on file.   Social History Main Topics  . Smoking status: Current Every Day Smoker    Packs/day: 1.00    Years: 48.00    Types: Cigarettes  . Smokeless tobacco: Never Used  . Alcohol use Yes     Comment: 02/20/2017 "nothing in  1 1/2 to 2 years"  . Drug use: Yes    Frequency: 1.0 time per week    Types: Marijuana     Comment: 02/20/2017 "weekly"   . Sexual activity: Yes    Partners: Female     Comment: pt. given condoms   Other Topics Concern  . Not on file   Social History Narrative  . No narrative on file    FAMILY HISTORY Family History  Problem Relation Age of Onset  . Diabetes Father   . Stroke Other   . Colon cancer Neg Hx     ALLERGIES:  has No Known Allergies.  MEDICATIONS:  Current Outpatient Prescriptions  Medication Sig Dispense Refill  . albuterol (PROVENTIL HFA;VENTOLIN HFA) 108 (90 BASE) MCG/ACT inhaler Inhale 2 puffs into the lungs every 4 (four) hours as needed for wheezing or shortness of breath.    Marland Kitchen albuterol (PROVENTIL) (2.5 MG/3ML) 0.083% nebulizer solution Take 2.5 mg by nebulization every 6 (six) hours as needed for wheezing or shortness of breath.    Marland Kitchen amitriptyline (ELAVIL) 50 MG tablet Take 50 mg by mouth at bedtime as needed for sleep.     . COMBIVENT RESPIMAT 20-100 MCG/ACT AERS respimat Inhale 2 puffs into the lungs every 4 (four) hours as needed. wheezing    . cyclobenzaprine (FLEXERIL) 10 MG tablet Take 10 mg by mouth 3 (three) times daily as needed for muscle spasms.    . diazepam (VALIUM) 10 MG tablet Take 10 mg by mouth 3 (three) times daily as needed for anxiety.     . elvitegravir-cobicistat-emtricitabine-tenofovir (GENVOYA) 150-150-200-10 MG TABS tablet Take 1 tablet by mouth daily with breakfast. 90 tablet 3  . gabapentin (NEURONTIN) 600 MG tablet Take 600 mg by mouth every 4 (four) hours as needed (pain).    Marland Kitchen levofloxacin (LEVAQUIN) 750 MG tablet Take 1 tablet (750 mg total) by mouth daily. 5 tablet 0  . nicotine (NICODERM CQ - DOSED IN MG/24 HOURS) 21 mg/24hr patch Place 1 patch (21 mg total) onto the skin daily. 28 patch 0  . omeprazole (PRILOSEC) 40 MG capsule Take 40 mg by mouth daily.      Marland Kitchen oxyCODONE (OXYCONTIN) 15 mg 12 hr tablet Take 1 tablet (15 mg total) by mouth every 12 (twelve) hours. 14 tablet 0  . oxyCODONE-acetaminophen (PERCOCET/ROXICET) 5-325 MG per tablet Take 1  tablet by mouth every 4 (four) hours as needed for severe pain. 20 tablet 0  . pantoprazole (PROTONIX) 40 MG tablet Take 1 tablet (40 mg total) by mouth daily. 30 tablet 1  . Rivaroxaban (XARELTO) 15 MG TABS tablet Take 1 tablet (15 mg total) by mouth 2 (two) times daily with a meal. 42 tablet 0  . rivaroxaban (XARELTO) 20 MG TABS tablet Take 1 tablet (20 mg total) by mouth daily with supper. 30 tablet 3  . torsemide (DEMADEX) 20 MG tablet Take 20 mg by mouth daily.     No current facility-administered medications for this visit.     PHYSICAL EXAMINATION:  Vitals:  08/21/17 1333  BP: (!) 139/96  Pulse: 96  Resp: 18  Temp: 97.8 F (36.6 C)  SpO2: 98%    Filed Weights   08/21/17 1333  Weight: 215 lb 8 oz (97.8 kg)     Physical Exam Constitutional: Well-developed, well-nourished, and in no distress.   HENT:  Head: Normocephalic and atraumatic.  Mouth/Throat: No oropharyngeal exudate. Mucosa moist. Eyes: Pupils are equal, round, and reactive to light. Conjunctivae are normal. No scleral icterus.  Neck: Normal range of motion. Neck supple. No JVD present.  Cardiovascular: Normal rate, regular rhythm and normal heart sounds.  Exam reveals no gallop and no friction rub.   No murmur heard. Pulmonary/Chest: Effort normal. No respiratory distress. No rales. Bilateral expiratory wheezes Abdominal: Soft. Bowel sounds are normal. No distension. There is no tenderness. There is no guarding.  Musculoskeletal: No edema or tenderness.  Lymphadenopathy:    No cervical or supraclavicular adenopathy.  Neurological: Alert and oriented to person, place, and time. No cranial nerve deficit.  Skin: Skin is warm and dry. No rash noted. No erythema. No pallor.  Psychiatric: Affect and judgment normal.    LABORATORY DATA: I have personally reviewed the data as listed:  No visits with results within 1 Month(s) from this visit.  Latest known visit with results is:  Admission on 02/20/2017,  Discharged on 02/22/2017  Component Date Value Ref Range Status  . Sodium 02/20/2017 135  135 - 145 mmol/L Final  . Potassium 02/20/2017 3.7  3.5 - 5.1 mmol/L Final  . Chloride 02/20/2017 102  101 - 111 mmol/L Final  . CO2 02/20/2017 23  22 - 32 mmol/L Final  . Glucose, Bld 02/20/2017 156* 65 - 99 mg/dL Final  . BUN 02/20/2017 8  6 - 20 mg/dL Final  . Creatinine, Ser 02/20/2017 1.20  0.61 - 1.24 mg/dL Final  . Calcium 02/20/2017 9.4  8.9 - 10.3 mg/dL Final  . GFR calc non Af Amer 02/20/2017 >60  >60 mL/min Final  . GFR calc Af Amer 02/20/2017 >60  >60 mL/min Final   Comment: (NOTE) The eGFR has been calculated using the CKD EPI equation. This calculation has not been validated in all clinical situations. eGFR's persistently <60 mL/min signify possible Chronic Kidney Disease.   . Anion gap 02/20/2017 10  5 - 15 Final  . WBC 02/20/2017 11.5* 4.0 - 10.5 K/uL Final  . RBC 02/20/2017 4.49  4.22 - 5.81 MIL/uL Final  . Hemoglobin 02/20/2017 15.1  13.0 - 17.0 g/dL Final  . HCT 02/20/2017 43.6  39.0 - 52.0 % Final  . MCV 02/20/2017 97.1  78.0 - 100.0 fL Final  . MCH 02/20/2017 33.6  26.0 - 34.0 pg Final  . MCHC 02/20/2017 34.6  30.0 - 36.0 g/dL Final  . RDW 02/20/2017 11.8  11.5 - 15.5 % Final  . Platelets 02/20/2017 223  150 - 400 K/uL Final  . Troponin i, poc 02/20/2017 0.00  0.00 - 0.08 ng/mL Final  . Comment 3 02/20/2017          Final   Comment: Due to the release kinetics of cTnI, a negative result within the first hours of the onset of symptoms does not rule out myocardial infarction with certainty. If myocardial infarction is still suspected, repeat the test at appropriate intervals.   . Heparin Unfractionated 02/20/2017 0.19* 0.30 - 0.70 IU/mL Final   Comment:        IF HEPARIN RESULTS ARE BELOW EXPECTED VALUES, AND PATIENT DOSAGE HAS BEEN  CONFIRMED, SUGGEST FOLLOW UP TESTING OF ANTITHROMBIN III LEVELS.   . Color, Urine 02/21/2017 AMBER* YELLOW Final   BIOCHEMICALS MAY  BE AFFECTED BY COLOR  . APPearance 02/21/2017 CLEAR  CLEAR Final  . Specific Gravity, Urine 02/21/2017 1.042* 1.005 - 1.030 Final  . pH 02/21/2017 5.0  5.0 - 8.0 Final  . Glucose, UA 02/21/2017 NEGATIVE  NEGATIVE mg/dL Final  . Hgb urine dipstick 02/21/2017 NEGATIVE  NEGATIVE Final  . Bilirubin Urine 02/21/2017 NEGATIVE  NEGATIVE Final  . Ketones, ur 02/21/2017 NEGATIVE  NEGATIVE mg/dL Final  . Protein, ur 02/21/2017 30* NEGATIVE mg/dL Final  . Nitrite 02/21/2017 NEGATIVE  NEGATIVE Final  . Leukocytes, UA 02/21/2017 NEGATIVE  NEGATIVE Final  . RBC / HPF 02/21/2017 0-5  0 - 5 RBC/hpf Final  . WBC, UA 02/21/2017 0-5  0 - 5 WBC/hpf Final  . Bacteria, UA 02/21/2017 NONE SEEN  NONE SEEN Final  . Squamous Epithelial / LPF 02/21/2017 NONE SEEN  NONE SEEN Final  . Mucus 02/21/2017 PRESENT   Final  . Hgb A1c MFr Bld 02/20/2017 5.8* 4.8 - 5.6 % Final   Comment: (NOTE)         Pre-diabetes: 5.7 - 6.4         Diabetes: >6.4         Glycemic control for adults with diabetes: <7.0   . Mean Plasma Glucose 02/20/2017 120  mg/dL Final   Comment: (NOTE) Performed At: [facility] [place], [place] [id number] Lindon Romp MD [id number]   . Heparin Unfractionated 02/20/2017 0.31  0.30 - 0.70 IU/mL Final   Comment:        IF HEPARIN RESULTS ARE BELOW EXPECTED VALUES, AND PATIENT DOSAGE HAS BEEN CONFIRMED, SUGGEST FOLLOW UP TESTING OF ANTITHROMBIN III LEVELS.   Marland Kitchen Heparin Unfractionated 02/21/2017 0.37  0.30 - 0.70 IU/mL Final   Comment:        IF HEPARIN RESULTS ARE BELOW EXPECTED VALUES, AND PATIENT DOSAGE HAS BEEN CONFIRMED, SUGGEST FOLLOW UP TESTING OF ANTITHROMBIN III LEVELS.   . WBC 02/21/2017 11.2* 4.0 - 10.5 K/uL Final  . RBC 02/21/2017 4.45  4.22 - 5.81 MIL/uL Final  . Hemoglobin 02/21/2017 15.0  13.0 - 17.0 g/dL Final  . HCT 02/21/2017 42.8  39.0 - 52.0 % Final  . MCV 02/21/2017 96.2  78.0 - 100.0 fL Final  . MCH 02/21/2017 33.7  26.0 - 34.0 pg Final   . MCHC 02/21/2017 35.0  30.0 - 36.0 g/dL Final  . RDW 02/21/2017 11.7  11.5 - 15.5 % Final  . Platelets 02/21/2017 260  150 - 400 K/uL Final  . Sodium 02/21/2017 136  135 - 145 mmol/L Final  . Potassium 02/21/2017 3.8  3.5 - 5.1 mmol/L Final  . Chloride 02/21/2017 103  101 - 111 mmol/L Final  . CO2 02/21/2017 22  22 - 32 mmol/L Final  . Glucose, Bld 02/21/2017 113* 65 - 99 mg/dL Final  . BUN 02/21/2017 18  6 - 20 mg/dL Final  . Creatinine, Ser 02/21/2017 1.50* 0.61 - 1.24 mg/dL Final  . Calcium 02/21/2017 9.5  8.9 - 10.3 mg/dL Final  . Total Protein 02/21/2017 8.3* 6.5 - 8.1 g/dL Final  . Albumin 02/21/2017 3.8  3.5 - 5.0 g/dL Final  . AST 02/21/2017 23  15 - 41 U/L Final  . ALT 02/21/2017 13* 17 - 63 U/L Final  . Alkaline Phosphatase 02/21/2017 54  38 - 126 U/L Final  . Total Bilirubin 02/21/2017 0.4  0.3 - 1.2 mg/dL Final  . GFR calc non Af Amer 02/21/2017 51* >60 mL/min Final  . GFR calc Af Amer 02/21/2017 60* >60 mL/min Final   Comment: (NOTE) The eGFR has been calculated using the CKD EPI equation. This calculation has not been validated in all clinical situations. eGFR's persistently <60 mL/min signify possible Chronic Kidney Disease.   . Anion gap 02/21/2017 11  5 - 15 Final  . Prothrombin Time 02/21/2017 14.2  11.4 - 15.2 seconds Final  . INR 02/21/2017 1.10   Final  . Glucose-Capillary 02/21/2017 178* 65 - 99 mg/dL Final  . Heparin Unfractionated 02/21/2017 0.32  0.30 - 0.70 IU/mL Final   Comment:        IF HEPARIN RESULTS ARE BELOW EXPECTED VALUES, AND PATIENT DOSAGE HAS BEEN CONFIRMED, SUGGEST FOLLOW UP TESTING OF ANTITHROMBIN III LEVELS.   Marland Kitchen Heparin Unfractionated 02/22/2017 0.40  0.30 - 0.70 IU/mL Final   Comment:        IF HEPARIN RESULTS ARE BELOW EXPECTED VALUES, AND PATIENT DOSAGE HAS BEEN CONFIRMED, SUGGEST FOLLOW UP TESTING OF ANTITHROMBIN III LEVELS.   . WBC 02/22/2017 6.8  4.0 - 10.5 K/uL Final  . RBC 02/22/2017 3.93* 4.22 - 5.81 MIL/uL Final  .  Hemoglobin 02/22/2017 13.0  13.0 - 17.0 g/dL Final  . HCT 02/22/2017 37.9* 39.0 - 52.0 % Final  . MCV 02/22/2017 96.4  78.0 - 100.0 fL Final  . MCH 02/22/2017 33.1  26.0 - 34.0 pg Final  . MCHC 02/22/2017 34.3  30.0 - 36.0 g/dL Final  . RDW 02/22/2017 11.7  11.5 - 15.5 % Final  . Platelets 02/22/2017 214  150 - 400 K/uL Final  . Sodium 02/22/2017 138  135 - 145 mmol/L Final  . Potassium 02/22/2017 4.0  3.5 - 5.1 mmol/L Final  . Chloride 02/22/2017 101  101 - 111 mmol/L Final  . CO2 02/22/2017 28  22 - 32 mmol/L Final  . Glucose, Bld 02/22/2017 108* 65 - 99 mg/dL Final  . BUN 02/22/2017 16  6 - 20 mg/dL Final  . Creatinine, Ser 02/22/2017 1.43* 0.61 - 1.24 mg/dL Final  . Calcium 02/22/2017 9.5  8.9 - 10.3 mg/dL Final  . Total Protein 02/22/2017 7.1  6.5 - 8.1 g/dL Final  . Albumin 02/22/2017 3.2* 3.5 - 5.0 g/dL Final  . AST 02/22/2017 19  15 - 41 U/L Final  . ALT 02/22/2017 12* 17 - 63 U/L Final  . Alkaline Phosphatase 02/22/2017 52  38 - 126 U/L Final  . Total Bilirubin 02/22/2017 0.3  0.3 - 1.2 mg/dL Final  . GFR calc non Af Amer 02/22/2017 55* >60 mL/min Final  . GFR calc Af Amer 02/22/2017 >60  >60 mL/min Final   Comment: (NOTE) The eGFR has been calculated using the CKD EPI equation. This calculation has not been validated in all clinical situations. eGFR's persistently <60 mL/min signify possible Chronic Kidney Disease.   . Anion gap 02/22/2017 9  5 - 15 Final  . Glucose-Capillary 02/22/2017 123* 65 - 99 mg/dL Final  . Comment 1 02/22/2017 Notify RN   Final    RADIOGRAPHIC STUDIES: I have personally reviewed the radiological images as listed and agree with the findings in the report  No results found.  ASSESSMENT/PLAN 56 year old male with PMH of HIV with undetectable viral load presents for evaluation of bilateral PE/right leg DVT diagnosed in April 2018.  PLAN: -This is the patient's first thrombotic event.  It may have been provoked  by him being more sedentary in  the weeks leading up to developing his blood clots.  -HIV infection itself can cause a prothrombotic state. Recent literature describes an incidence ranging from 0.26% to 7.6%; higher incidence is seen in patients with active opportunistic infections or malignancy, and in patients with the acquired immunodeficiency syndrome. A variety of potential mechanisms have been proposed to account for the observed hypercoagulability in HIV-infected patients. These include the presence of antiphospholipid-anticardiolipin antibodies, decreased activities of natural anticoagulants (especially protein S), and increased platelet activation.  -I will get his hypercoagulable workup from his PCP for review.  -Patient has had a 53-monthmark for treatment of his PE/DVT.  Therefore I have ordered a stat CT angiogram of the chest as well as Doppler venous ultrasounds of bilateral lower extremity to assess for resolution of his blood clots.  If all of his blood clots have resolved he can likely be taken off of anticoagulation since this is his first thrombotic event.  We may repeat his hypercoagulable workup after he is off of Xarelto since many of the tests and the hypercoagulable workup can be skewed by being on a direct thrombin inhibitor.  -Return to clinic in 1 week to review CT angiogram and Doppler venous ultrasound and to discuss the next plan of care.   Orders Placed This Encounter  Procedures  . CT ANGIO CHEST PE W OR WO CONTRAST    Standing Status:   Future    Standing Expiration Date:   11/21/2018    Order Specific Question:   If indicated for the ordered procedure, I authorize the administration of contrast media per Radiology protocol    Answer:   Yes    Order Specific Question:   Preferred imaging location?    Answer:   [facility]   Order Specific Question:   Radiology Contrast Protocol - do NOT remove file path    Answer:   [email or web address]    All questions were  answered. The patient knows to call the clinic with any problems, questions or concerns.  This note was electronically signed.    LTwana First MD  08/21/2017 1:19 PM

## 2017-08-26 NOTE — Progress Notes (Signed)
[facility] [address], [place] [address]   CLINIC:  Medical Oncology/Hematology  PCP:  Gareth Morgan, MD [address] [place] [address] [phone]   REASON FOR VISIT:  Follow-up for (R) LE DVT and bilateral PEs   CURRENT THERAPY: Xarelto 20 mg daily, since 01/2017    HISTORY OF PRESENT ILLNESS:  (From Dr. Juanda Chance last note on 08/21/17)  Devin [name] 56 y.o. male presents for evaluation of PE.  Patient initially presented to [facility] ED on 02/20/2017 with severe pain on the right side of his chest.  He had a CT angiogram of the chest on 02/20/2017 which demonstrated acute bilateral pulmonary emboli within the right main pulmonary artery; right upper and right lower lobar arteries, extending into right upper and lower lobe segmental branches; and left lower lobe segmental arteries. No CT evidence of right heart strain. Posterior right upper lobe peripheral opacities likely indicate an area of infarcted lung.  He then had a Doppler venous ultrasound of bilateral lower extremities which demonstrated right leg positive for subacute DVT in the popliteal vein and posterior tibial and peroneal veins. Left leg, negative for deep and superficial vein thrombosis.   Patient was treated with a heparin gtt while inpatient and upon discharge on 02/22/17 he was transitioned to xarelto. He has been on Xarelto since April and has not had any problems with Xyler bleeding.  Patient states that prior to his diagnosis of PE/DVT, he was more sedentary due to the severity of his back pain and sciatica.  Patient had noticed swelling in his bilateral legs for a few weeks right worse than the left.  He denies any history of travel including road trips or air flights.  He does have a history of HIV but states that his viral load has been undetectable for years.  It appears that he may have had a hypercoagulable workup performed by his primary care physician, since his last PCP note  stated patient elevated protein C and Antithrombin III activity level, however I do not have any of these labs available to me. This was his first episode of PE/DVT. No family history of thrombophilia to his knowledge.  Today he states that he feels very debilitated by his back pain and sciatica. He has occasional swelling in his feet.  He states that he has occasional shortness of breath.  Denies any chest pain, palpitations, abdominal pain, constipation, diarrhea, nausea, vomiting, headaches, dizziness.  He smokes 1 pack/day.    INTERVAL HISTORY:  Devin [name] 56 y.o. male returns for routine follow-up for right leg DVT and bilateral PEs.   His biggest complaint today is cough and congestion; he recently received antibiotics from his PCP. He has not tried any OTC expectorants "because I don't know how they will react with my HIV meds."  He feels short of breath "always."    Remains on Xarelto with good tolerance. Denies any bleeding episodes. He has leg swelling, which has been chronic. He is not very active, but is trying to do better with his activity. He walks with cane.  Denies any skin changes/redness/warmth to his bilateral lower extremities.  He feels tired; appetite 25% and "no energy" per his report. He has peripheral neuropathy, which is also chronic.      REVIEW OF SYSTEMS:  Review of Systems  Constitutional: Positive for diaphoresis and fatigue. Negative for chills and fever (denies any recent fevers).  HENT:  Negative.   Eyes: Negative.  Respiratory: Positive for cough and shortness of breath.   Cardiovascular: Positive for leg swelling.  Gastrointestinal: Negative.   Endocrine: Negative.   Genitourinary: Negative.    Musculoskeletal: Positive for arthralgias (back, legs, and feet pain).  Skin: Negative.   Neurological: Positive for numbness.  Hematological: Bruises/bleeds easily.  Psychiatric/Behavioral: Negative.      PAST MEDICAL/SURGICAL HISTORY:  Past Medical  History:  Diagnosis Date  . Anxiety   . Arthritis    "I'm eat up w/it" (02/20/2017)  . Asthma   . Chronic back pain    "the whole back" (02/20/2017)  . Chronic bronchitis (HCC)   . Complication of anesthesia    "felt like I couldn't breath coming out of it"  . COPD (chronic obstructive pulmonary disease) (HCC)   . Depression   . GERD (gastroesophageal reflux disease)   . History of hiatal hernia   . History of stomach ulcers    "bleeding ones; I was young then"  . HIV infection (HCC) dx'd ~ 1999  . Hypertension   . Pneumonia    "several times" (02/20/2017)  . Prolapsed disk 10/28  . Pulmonary embolism (HCC) 02/20/2017  . Scoliosis 08/24/13   Past Surgical History:  Procedure Laterality Date  . KNEE ARTHROSCOPY Right 1980s     SOCIAL HISTORY:  Social History   Social History  . Marital status: Divorced    Spouse name: N/A  . Number of children: N/A  . Years of education: N/A   Occupational History  . Not on file.   Social History Main Topics  . Smoking status: Current Every Day Smoker    Packs/day: 1.00    Years: 48.00    Types: Cigarettes  . Smokeless tobacco: Never Used  . Alcohol use Yes     Comment: 02/20/2017 "nothing in  1 1/2 to 2 years"  . Drug use: Yes    Frequency: 1.0 time per week    Types: Marijuana     Comment: 02/20/2017 "weekly"  . Sexual activity: Yes    Partners: Female     Comment: pt. given condoms   Other Topics Concern  . Not on file   Social History Narrative  . No narrative on file    FAMILY HISTORY:  Family History  Problem Relation Age of Onset  . Diabetes Father   . Stroke Other   . Colon cancer Neg Hx     CURRENT MEDICATIONS:  Outpatient Encounter Prescriptions as of 08/27/2017  Medication Sig Note  . albuterol (PROVENTIL HFA;VENTOLIN HFA) 108 (90 BASE) MCG/ACT inhaler Inhale 2 puffs into the lungs every 4 (four) hours as needed for wheezing or shortness of breath.   Marland Kitchen albuterol (PROVENTIL) (2.5 MG/3ML) 0.083%  nebulizer solution Take 2.5 mg by nebulization every 6 (six) hours as needed for wheezing or shortness of breath.   Marland Kitchen amitriptyline (ELAVIL) 50 MG tablet Take 50 mg by mouth at bedtime as needed for sleep.  08/28/2014: .  Marland Kitchen COMBIVENT RESPIMAT 20-100 MCG/ACT AERS respimat Inhale 2 puffs into the lungs every 4 (four) hours as needed. wheezing 08/28/2014: .  . cyclobenzaprine (FLEXERIL) 10 MG tablet Take 10 mg by mouth 3 (three) times daily as needed for muscle spasms.   . diazepam (VALIUM) 10 MG tablet Take 10 mg by mouth 3 (three) times daily as needed for anxiety.    . elvitegravir-cobicistat-emtricitabine-tenofovir (GENVOYA) 150-150-200-10 MG TABS tablet Take 1 tablet by mouth daily with breakfast.   . gabapentin (NEURONTIN) 600 MG tablet Take 600 mg  by mouth every 4 (four) hours as needed (pain).   Marland Kitchen. levofloxacin (LEVAQUIN) 750 MG tablet Take 1 tablet (750 mg total) by mouth daily.   . nicotine (NICODERM CQ - DOSED IN MG/24 HOURS) 21 mg/24hr patch Place 1 patch (21 mg total) onto the skin daily.   Marland Kitchen. omeprazole (PRILOSEC) 40 MG capsule Take 40 mg by mouth daily.     Marland Kitchen. oxyCODONE (OXYCONTIN) 15 mg 12 hr tablet Take 1 tablet (15 mg total) by mouth every 12 (twelve) hours.   Marland Kitchen. oxyCODONE-acetaminophen (PERCOCET/ROXICET) 5-325 MG per tablet Take 1 tablet by mouth every 4 (four) hours as needed for severe pain.   . pantoprazole (PROTONIX) 40 MG tablet Take 1 tablet (40 mg total) by mouth daily.   . rivaroxaban (XARELTO) 20 MG TABS tablet Take 1 tablet (20 mg total) by mouth daily with supper.   . torsemide (DEMADEX) 20 MG tablet Take 20 mg by mouth daily.   . [DISCONTINUED] oxyCODONE-acetaminophen (PERCOCET) 10-325 MG tablet    . [DISCONTINUED] Rivaroxaban (XARELTO) 15 MG TABS tablet Take 1 tablet (15 mg total) by mouth 2 (two) times daily with a meal.    No facility-administered encounter medications on file as of 08/27/2017.     ALLERGIES:  No Known Allergies   PHYSICAL EXAM:  ECOG Performance  status: 1 - Symptomatic; remains largely independent   Vitals:   08/27/17 1008  BP: (!) 150/97  Pulse: 99  Resp: 20  Temp: 98 F (36.7 C)  SpO2: 94%   Filed Weights   08/27/17 1008  Weight: 214 lb (97.1 kg)    Physical Exam  Constitutional: He is oriented to person, place, and time and well-developed, well-nourished, and in no distress.  HENT:  Head: Normocephalic.  Mouth/Throat: Oropharynx is clear and moist. No oropharyngeal exudate.  Eyes: Pupils are equal, round, and reactive to light. Conjunctivae are normal. No scleral icterus.  Neck: Normal range of motion. Neck supple.  Cardiovascular: Normal rate and regular rhythm.   Pulmonary/Chest: Effort normal. No respiratory distress.  Course rhonchi noted to bilat upper lobes (encouraged him to try OTC Mucinex)  Abdominal: Soft. Bowel sounds are normal. There is no tenderness.  Musculoskeletal: He exhibits edema (1+ BLE edema; left slightly worse than right ).  Ambulates with cane  Lymphadenopathy:    He has no cervical adenopathy.  Neurological: He is alert and oriented to person, place, and time. No cranial nerve deficit.  Skin: Skin is warm and dry. No rash noted.  Psychiatric: Mood, memory, affect and judgment normal.  Nursing note and vitals reviewed.    LABORATORY DATA:  I have reviewed the labs as listed.  CBC    Component Value Date/Time   WBC 5.3 08/21/2017 1421   RBC 4.37 08/21/2017 1421   HGB 14.6 08/21/2017 1421   HCT 41.9 08/21/2017 1421   PLT 155 08/21/2017 1421   MCV 95.9 08/21/2017 1421   MCH 33.4 08/21/2017 1421   MCHC 34.8 08/21/2017 1421   RDW 11.9 08/21/2017 1421   LYMPHSABS 2.3 08/21/2017 1421   MONOABS 0.4 08/21/2017 1421   EOSABS 0.2 08/21/2017 1421   BASOSABS 0.0 08/21/2017 1421   CMP Latest Ref Rng & Units 08/21/2017 02/22/2017 02/21/2017  Glucose 65 - 99 mg/dL 409(W139(H) 119(J108(H) 478(G113(H)  BUN 6 - 20 mg/dL 6 16 18   Creatinine 0.61 - 1.24 mg/dL 9.560.98 2.13(Y1.43(H) 8.65(H1.50(H)  Sodium 135 - 145 mmol/L  137 138 136  Potassium 3.5 - 5.1 mmol/L 3.6 4.0 3.8  Chloride  101 - 111 mmol/L 101 101 103  CO2 22 - 32 mmol/L 26 28 22   Calcium 8.9 - 10.3 mg/dL 9.1 9.5 9.5  Total Protein 6.5 - 8.1 g/dL 7.3 7.1 9.6(E)  Total Bilirubin 0.3 - 1.2 mg/dL 0.3 0.3 0.4  Alkaline Phos 38 - 126 U/L 50 52 54  AST 15 - 41 U/L 30 19 23   ALT 17 - 63 U/L 24 12(L) 13(L)    PENDING LABS:    DIAGNOSTIC IMAGING:  *The following radiologic images and reports have been reviewed independently and agree with below findings.  CTA chest: 08/21/17 CLINICAL DATA:  Evaluate for residual pulmonary embolism status post treatment.  EXAM: CT ANGIOGRAPHY CHEST WITH CONTRAST  TECHNIQUE: Multidetector CT imaging of the chest was performed using the standard protocol during bolus administration of intravenous contrast. Multiplanar CT image reconstructions and MIPs were obtained to evaluate the vascular anatomy.  CONTRAST:  100 cc Isovue 370 intravenous contrast.  COMPARISON:  CTA chest dated February 20, 2017.  FINDINGS: Cardiovascular: Satisfactory opacification of the pulmonary arteries to the segmental level. No evidence of pulmonary embolism. Normal heart size. No pericardial effusion.  Mediastinum/Nodes: No enlarged mediastinal, hilar, or axillary lymph nodes. Thyroid gland, trachea, and esophagus demonstrate no significant findings.  Lungs/Pleura: Nearly resolved infarcts versus scarring in the right upper lobe. No suspicious pulmonary nodule. No pleural effusion or pneumothorax.  Upper Abdomen: No acute abnormality.  Musculoskeletal: No chest wall abnormality. No acute or significant osseous findings.  Review of the MIP images confirms the above findings.  IMPRESSION: 1. Resolved bilateral pulmonary emboli. Nearly resolved pulmonary infarcts versus scarring in the right upper lobe.   Electronically Signed   By: Obie Dredge M.D.   On: 08/21/2017 15:54    Ultrasound bilat LE:  08/21/17 CLINICAL DATA:  Bilateral lower extremity edema, right greater than left. History of pulmonary embolism in April 2018.  EXAM: BILATERAL LOWER EXTREMITY VENOUS DOPPLER ULTRASOUND  TECHNIQUE: Gray-scale sonography with graded compression, as well as color Doppler and duplex ultrasound were performed to evaluate the lower extremity deep venous systems from the level of the common femoral vein and including the common femoral, femoral, profunda femoral, popliteal and calf veins including the posterior tibial, peroneal and gastrocnemius veins when visible. The superficial great saphenous vein was also interrogated. Spectral Doppler was utilized to evaluate flow at rest and with distal augmentation maneuvers in the common femoral, femoral and popliteal veins.  COMPARISON:  None.  FINDINGS: RIGHT LOWER EXTREMITY  Common Femoral Vein: No evidence of thrombus. Normal compressibility, respiratory phasicity and response to augmentation.  Saphenofemoral Junction: No evidence of thrombus. Normal compressibility and flow on color Doppler imaging.  Profunda Femoral Vein: No evidence of thrombus. Normal compressibility and flow on color Doppler imaging.  Femoral Vein: No evidence of thrombus. Normal compressibility, respiratory phasicity and response to augmentation.  Popliteal Vein: No evidence of thrombus. Normal compressibility, respiratory phasicity and response to augmentation.  Calf Veins: No evidence of thrombus. Normal compressibility and flow on color Doppler imaging.  Superficial Great Saphenous Vein: No evidence of thrombus. Normal compressibility.  Venous Reflux:  None.  Other Findings:  None.  LEFT LOWER EXTREMITY  Common Femoral Vein: No evidence of thrombus. Normal compressibility, respiratory phasicity and response to augmentation.  Saphenofemoral Junction: No evidence of thrombus. Normal compressibility and flow on color Doppler  imaging.  Profunda Femoral Vein: No evidence of thrombus. Normal compressibility and flow on color Doppler imaging.  Femoral Vein: No evidence of thrombus. Normal compressibility,  respiratory phasicity and response to augmentation.  Popliteal Vein: No evidence of thrombus. Normal compressibility, respiratory phasicity and response to augmentation.  Calf Veins: No evidence of thrombus. Normal compressibility and flow on color Doppler imaging.  Superficial Great Saphenous Vein: No evidence of thrombus. Normal compressibility.  Venous Reflux:  None.  Other Findings:  None.  IMPRESSION: No evidence of deep venous thrombosis in either lower extremity.   Electronically Signed   By: Delbert Phenix M.D.   On: 08/21/2017 15:23     PATHOLOGY:     ASSESSMENT & PLAN:   (R) LE DVT and bilateral PEs:  -PEs & DVT diagnosed in 01/2017; he was admitted to hospital for anticoagulation where he received Heparin infusion, followed by transition to oral Xarelto.  He has been on Xarelto since 01/2017.   -Possibility that DVT/PE was provoked given recent sedentary lifestyle in weeks leading up to thrombotic events; he also smokes and is HIV+ which puts him at higher risk for thrombosis as well.  He reportedly had a prior hypercoagulable work-up with his PCP; we have requested those results.  -Repeat CTA chest and doppler ultrasound of bilateral lower extremities are negative for any residual clot.  These results were reviewed in detail with patient today; he was provided a copy of the radiologic reports as well.  -Discussed with Dr. Janyth Contes.  Given that this was his first possibly provoked thrombotic event, without evidence of residual clot on imaging, we can stop anti-coagulation at this time.  Would recommend that we repeat hypercoagulable work-up in the future when he is off of Xarelto, as being on anticoagulation can impact these results.  He agrees to this plan.  -Labs only in ~1 month.   -Return to cancer center in ~6-7 weeks for follow-up after labs have had adequate time to be resulted.  Gave him strict instructions to contact us and/or go to closest ED if he experiences any LE edema, shortness of breath, or chest pain.  Encouraged him to increase his physical activity as tolerated.    Cough/congestion:  -Encouraged continued follow-up with PCP for persistent symptoms. He currently smokes ~1 ppd (down from 2-3 ppd). He is working on quitting smoking. Provided support today for smoking cessation.  -Recommended he talk with pharmacy at retail pharmacy about OTC Mucinex taken with his HIV medications to ensure compatibility.       Dispo:  -Stop Xarelto today.  Return for labs only in ~1 month (hypercoag panel, CBC with diff, and CMET orders placed today).  -Return to cancer center in 6-7 weeks for follow-up to review results and finalize treatment plan.    All questions were answered to patient's stated satisfaction. Encouraged patient to call with any new concerns or questions before his next visit to the cancer center and we can certain see him sooner, if needed.    Plan of care discussed with Dr. Janyth Contes, who agrees with the above aforementioned.    Orders placed this encounter:  Orders Placed This Encounter  Procedures  . Antithrombin III  . Protein C activity  . Protein C, total  . Protein S activity  . Protein S, total  . Lupus anticoagulant panel  . Beta-2-glycoprotein i abs, IgG/M/A  . Homocysteine, serum  . Factor 5 leiden  . Prothrombin gene mutation  . Cardiolipin antibodies, IgG, IgM, IgA  . CBC with Differential/Platelet  . Comprehensive metabolic panel      Lubertha Basque, NP [facility] [phone]

## 2017-08-27 ENCOUNTER — Encounter (HOSPITAL_BASED_OUTPATIENT_CLINIC_OR_DEPARTMENT_OTHER): Payer: Medicare Other | Admitting: Adult Health

## 2017-08-27 ENCOUNTER — Encounter (HOSPITAL_COMMUNITY): Payer: Self-pay | Admitting: Adult Health

## 2017-08-27 VITALS — BP 150/97 | HR 99 | Temp 98.0°F | Resp 20 | Ht 72.0 in | Wt 214.0 lb

## 2017-08-27 DIAGNOSIS — I2699 Other pulmonary embolism without acute cor pulmonale: Secondary | ICD-10-CM | POA: Diagnosis not present

## 2017-08-27 DIAGNOSIS — Z720 Tobacco use: Secondary | ICD-10-CM

## 2017-08-27 DIAGNOSIS — Z7901 Long term (current) use of anticoagulants: Secondary | ICD-10-CM

## 2017-08-27 DIAGNOSIS — I82401 Acute embolism and thrombosis of unspecified deep veins of right lower extremity: Secondary | ICD-10-CM

## 2017-09-02 ENCOUNTER — Ambulatory Visit (HOSPITAL_COMMUNITY): Payer: Medicare Other

## 2017-09-03 ENCOUNTER — Other Ambulatory Visit (HOSPITAL_COMMUNITY): Payer: Medicare Other

## 2017-09-04 ENCOUNTER — Ambulatory Visit (HOSPITAL_COMMUNITY): Payer: Medicare Other | Admitting: Adult Health

## 2017-09-09 ENCOUNTER — Other Ambulatory Visit: Payer: Self-pay | Admitting: *Deleted

## 2017-09-09 ENCOUNTER — Telehealth: Payer: Self-pay | Admitting: Pharmacist

## 2017-09-09 DIAGNOSIS — B20 Human immunodeficiency virus [HIV] disease: Secondary | ICD-10-CM

## 2017-09-09 MED ORDER — ELVITEG-COBIC-EMTRICIT-TENOFAF 150-150-200-10 MG PO TABS: 1.0000 | ORAL_TABLET | Freq: Every day | ORAL | 0 refills | Status: DC

## 2017-09-09 NOTE — Telephone Encounter (Signed)
Patient coming to see Dr. Ninetta LightsHatcher tomorrow for HIV appointment. He was started on Xarelto back in April for bilateral PEs and a DVT.  Devin [name] is contraindicated with Xarelto, and the two medications should NOT be used together.  Will speak to Dr. Ninetta LightsHatcher [facility].

## 2017-09-09 NOTE — Progress Notes (Signed)
Patient's fiance Tammy called, requesting medication refill. He has not been seen in 1 year due to other medical issues. Patient's fiance states he has had multiple issues with blood clots and pulmonary embolisms, has been [facility] and by oncology/hematology frequently in the last year.  Patient has missed 2 days of medication.  [facility] day [facility], patient will come tomorrow for appointment with Dr Ninetta LightsHatcher. Tammy aware that this cannot be refilled without MD visit. Andree CossHowell, Michelle M, RN

## 2017-09-10 ENCOUNTER — Encounter: Payer: Self-pay | Admitting: Infectious Diseases

## 2017-09-10 ENCOUNTER — Ambulatory Visit (INDEPENDENT_AMBULATORY_CARE_PROVIDER_SITE_OTHER): Payer: Medicare Other | Admitting: Infectious Diseases

## 2017-09-10 VITALS — BP 154/104 | HR 92 | Temp 98.7°F | Wt 213.0 lb

## 2017-09-10 DIAGNOSIS — I2699 Other pulmonary embolism without acute cor pulmonale: Secondary | ICD-10-CM | POA: Diagnosis not present

## 2017-09-10 DIAGNOSIS — Z113 Encounter for screening for infections with a predominantly sexual mode of transmission: Secondary | ICD-10-CM

## 2017-09-10 DIAGNOSIS — B20 Human immunodeficiency virus [HIV] disease: Secondary | ICD-10-CM | POA: Diagnosis not present

## 2017-09-10 DIAGNOSIS — Z789 Other specified health status: Secondary | ICD-10-CM | POA: Insufficient documentation

## 2017-09-10 DIAGNOSIS — M41125 Adolescent idiopathic scoliosis, thoracolumbar region: Secondary | ICD-10-CM | POA: Diagnosis not present

## 2017-09-10 DIAGNOSIS — Z79899 Other long term (current) drug therapy: Secondary | ICD-10-CM

## 2017-09-10 MED ORDER — ELVITEG-COBIC-EMTRICIT-TENOFAF 150-150-200-10 MG PO TABS: 1.0000 | ORAL_TABLET | Freq: Every day | ORAL | 6 refills | Status: DC

## 2017-09-10 NOTE — Progress Notes (Signed)
   Subjective:    Patient ID: Devin [name], male    DOB: Nov 05, 1960, 56 y.o.   MRN: [id number]  HPI 56 yo M with HIV+ dx 1995, and hx of COPD. Maintained on genvoya (prev took combivi/efavirenz). Still taking care of his father with health issues 81(54 yo). Parents divorced, he has limited interactions with his mom.  Continues to have intermittent back pain- has had f/u with rehab/PT. Has seen neurosurgery. 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  MRI 07-26-16: IMPRESSION: MR THORACIC SPINE IMPRESSION 1. Stable thoracic spine since 2016. Dextro convex scoliosis with mid and lower thoracic disc, endplate, and posterior element degeneration. 2. No thoracic spinal stenosis. Moderate multifactorial left T8 foraminal stenosis is stable. 3. Small volume retained or aspirated secretions in the trachea. Query recent cough or respiratory infection.  MR LUMBAR SPINE IMPRESSION 1. Stable lumbar spine since 2016. Levoconvex upper lumbar scoliosis. Mild grade 1 anterolisthesis at L5-S1 with chronic L5 pars fractures. 2. Small caudal disc herniation on the left at L4-L5 with up to a moderate left lateral recess stenosis at the level of the descending L5 nerve roots has not significantly changed. 3. Up to moderate left L4 and bilateral L5 neural foraminal stenosis is stable  [facility]2017 with acute bilateral PE with lung infarct, LE DVT. He was started on Xarelto (contraindicated with his ART).     He had repeat Ct on 10-25 showing resolution of PE and nearly resolved pulmonary infarcts (vs scar RUL).  His repeat LE dopplers did not show DVTs.  He had f/u with H/O on 10-31 and was advised to stop his Xarelto. He will get hypercoaguable w/u in f/u.  Still having pain in his R groin. Feels like it is in his "main artery". Also has pain in his legs and feet from pinched nerve in his back, broken vertebrae.  [facility]).   No problems.  Review of Systems  Constitutional: Negative for appetite change, chills, fever and unexpected weight change.  Respiratory: Positive for cough. Negative for shortness of breath.   Cardiovascular: Positive for leg swelling.  Gastrointestinal: Negative for blood in stool, constipation and diarrhea.  Genitourinary: Negative for difficulty urinating.  Musculoskeletal: Positive for arthralgias.  Neurological: Positive for numbness.  Hematological: Does not bruise/bleed easily.  Psychiatric/Behavioral: Positive for dysphoric mood.  night sweats. Numbness in feet is worse with exertion.  Please see HPI. All other systems reviewed and negative.      Objective:   Physical Exam  Constitutional: He appears well-developed and well-nourished.  HENT:  Mouth/Throat: No oropharyngeal exudate.  Eyes: EOM are normal. Pupils are equal, round, and reactive to light.  Neck: Neck supple.  Cardiovascular: Normal rate, regular rhythm and normal heart sounds.  Pulmonary/Chest: Effort normal.  Abdominal: Soft. Bowel sounds are normal. There is no tenderness. There is no rebound.  Musculoskeletal: He exhibits no edema.  Lymphadenopathy:    He has no cervical adenopathy.  Psychiatric: He exhibits a depressed mood.  Tearful.       Assessment & Plan:

## 2017-09-10 NOTE — Assessment & Plan Note (Signed)
Await his surgical eval wt [facility]

## 2017-09-10 NOTE — Assessment & Plan Note (Addendum)
Appears to be resolved Glad he is off BlueLinxXafelto Hyper-coag w/u pending

## 2017-09-10 NOTE — Progress Notes (Signed)
HPI: Devin [name] is a 56 y.o. male who is here for his HIV visit.   Allergies: No Known Allergies  Vitals: Temp: 98.7 F (37.1 C) (11/14 0942) Temp Source: Oral (11/14 0942) BP: 154/104 (11/14 0942) Pulse Rate: 92 (11/14 0942)  Past Medical History: Past Medical History:  Diagnosis Date  . Anxiety   . Arthritis    "I'm eat up w/it" (02/20/2017)  . Asthma   . Chronic back pain    "the whole back" (02/20/2017)  . Chronic bronchitis (HCC)   . Complication of anesthesia    "felt like I couldn't breath coming out of it"  . COPD (chronic obstructive pulmonary disease) (HCC)   . Depression   . GERD (gastroesophageal reflux disease)   . History of hiatal hernia   . History of stomach ulcers    "bleeding ones; I was young then"  . HIV infection (HCC) dx'd ~ 1999  . Hypertension   . Pneumonia    "several times" (02/20/2017)  . Prolapsed disk 10/28  . Pulmonary embolism (HCC) 02/20/2017  . Scoliosis 08/24/13    Social History: Social History   Socioeconomic History  . Marital status: Divorced    Spouse name: None  . Number of children: None  . Years of education: None  . Highest education level: None  Social Needs  . Financial resource strain: None  . Food insecurity - worry: None  . Food insecurity - inability: None  . Transportation needs - medical: None  . Transportation needs - non-medical: None  Occupational History  . None  Tobacco Use  . Smoking status: Current Every Day Smoker    Packs/day: 1.00    Years: 48.00    Pack years: 48.00    Types: Cigarettes  . Smokeless tobacco: Never Used  Substance and Sexual Activity  . Alcohol use: Yes    Comment: 02/20/2017 "nothing in  1 1/2 to 2 years"  . Drug use: Yes    Frequency: 1.0 times per week    Types: Marijuana    Comment: 02/20/2017 "weekly"  . Sexual activity: Yes    Partners: Female    Comment: pt. given condoms  Other Topics Concern  . None  Social History Narrative  . None    Previous  Regimen: ATP  Current Regimen: Genvoya  Labs: HIV 1 RNA Quant (copies/mL)  Date Value  08/21/2016 <20  12/01/2013 <20  04/12/2013 <20   CD4 T Cell Abs  Date Value  08/21/2016 1,110 /uL  12/01/2013 870 /uL  04/12/2013 750 cmm   Hep B S Ab (no units)  Date Value  04/12/2013 REACTIVE (A)   Hepatitis B Surface Ag (no units)  Date Value  12/22/2006 NO   HCV Ab (no units)  Date Value  12/22/2006 NO    CrCl: Estimated Creatinine Clearance: 103.8 mL/min (by C-G formula based on SCr of 0.98 mg/dL).  Lipids:    Component Value Date/Time   CHOL 203 (H) 08/21/2016 1602   TRIG 292 (H) 08/21/2016 1602   HDL 30 (L) 08/21/2016 1602   CHOLHDL 6.8 (H) 08/21/2016 1602   VLDL 58 (H) 08/21/2016 1602   LDLCALC 115 08/21/2016 1602    Assessment: Devin [name] is here for his HIV visit with Dr. Ninetta LightsHatcher. He is here with his wife. He has always been suppressed. Dr. Ninetta LightsHatcher would like to put her on PrEP. Explained the PrEP process to her. She doesn't work right now or have any income, therefore, we will use the uninsured  PrEP for her. The transmission risk is extremely low or basically zero since he is suppressed. He is no longer on Xarelto for his PE so the interaction issue is not an issue anymore. Took the med of the profile. [facility] will mail his meds  His wife will come and see us for PrEP on Friday.   Recommendations:  Continue Genvoya 1 PO qday [facility] so [facility] can mail  Ulyses SouthwardMinh Lamario Mani, PharmD, BCPS, AAHIVP, CPP Clinical Infectious Disease Pharmacist [facility] 09/10/2017, 10:41 AM

## 2017-09-10 NOTE — Assessment & Plan Note (Signed)
Get wife tested, has not been tested this year.  Check labs for pt Offered/refused condoms.  He has gotten flu shot.  rtc in 6 months.

## 2017-09-11 LAB — LIPID PANEL
CHOLESTEROL: 221 mg/dL — AB (ref <200)
HDL: 30 mg/dL — ABNORMAL LOW (ref >40)
LDL Cholesterol (Calc): 135 mg/dL (calc) — ABNORMAL HIGH
Non-HDL Cholesterol (Calc): 191 mg/dL (calc) — ABNORMAL HIGH (ref <130)
Total CHOL/HDL Ratio: 7.4 (calc) — ABNORMAL HIGH (ref <5.0)
Triglycerides: 365 mg/dL — ABNORMAL HIGH (ref <150)

## 2017-09-11 LAB — COMPREHENSIVE METABOLIC PANEL
AG RATIO: 1.7 (calc) (ref 1.0–2.5)
ALBUMIN MSPROF: 4.7 g/dL (ref 3.6–5.1)
ALKALINE PHOSPHATASE (APISO): 49 U/L (ref 40–115)
ALT: 23 U/L (ref 9–46)
AST: 31 U/L (ref 10–35)
BILIRUBIN TOTAL: 0.5 mg/dL (ref 0.2–1.2)
BUN: 8 mg/dL (ref 7–25)
CALCIUM: 9.9 mg/dL (ref 8.6–10.3)
CHLORIDE: 107 mmol/L (ref 98–110)
CO2: 23 mmol/L (ref 20–32)
Creat: 0.96 mg/dL (ref 0.70–1.33)
GLOBULIN: 2.8 g/dL (ref 1.9–3.7)
Glucose, Bld: 111 mg/dL — ABNORMAL HIGH (ref 65–99)
POTASSIUM: 4.2 mmol/L (ref 3.5–5.3)
SODIUM: 140 mmol/L (ref 135–146)
TOTAL PROTEIN: 7.5 g/dL (ref 6.1–8.1)

## 2017-09-11 LAB — CBC
HEMATOCRIT: 45.5 % (ref 38.5–50.0)
HEMOGLOBIN: 16.3 g/dL (ref 13.2–17.1)
MCH: 33.3 pg — ABNORMAL HIGH (ref 27.0–33.0)
MCHC: 35.8 g/dL (ref 32.0–36.0)
MCV: 92.9 fL (ref 80.0–100.0)
MPV: 9.3 fL (ref 7.5–12.5)
Platelets: 225 10*3/uL (ref 140–400)
RBC: 4.9 10*6/uL (ref 4.20–5.80)
RDW: 12.4 % (ref 11.0–15.0)
WBC: 8.8 10*3/uL (ref 3.8–10.8)

## 2017-09-11 LAB — RPR: RPR Ser Ql: NONREACTIVE

## 2017-09-11 LAB — T-HELPER CELL (CD4) - (RCID CLINIC ONLY)
CD4 T CELL ABS: 900 /uL (ref 400–2700)
CD4 T CELL HELPER: 36 % (ref 33–55)

## 2017-09-12 ENCOUNTER — Other Ambulatory Visit: Payer: Self-pay

## 2017-09-12 LAB — HIV-1 RNA QUANT-NO REFLEX-BLD
HIV 1 RNA QUANT: NOT DETECTED {copies}/mL
HIV-1 RNA Quant, Log: <1.3 Log copies/mL

## 2017-10-01 ENCOUNTER — Other Ambulatory Visit (HOSPITAL_COMMUNITY): Payer: Medicare Other

## 2017-10-02 MED FILL — GENVOYA TABLET: 150-150-200 | 30 days supply | Qty: 30 | Fill #0

## 2017-10-07 NOTE — Progress Notes (Deleted)
[facility] [address], [place] [address]   CLINIC:  Medical Oncology/Hematology  PCP:  Gareth Morgan, MD [address] 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 Juanda Chance last note on 08/21/17)  Devin [name] 56 y.o. male presents for evaluation of PE.  Patient initially presented to [facility] ED on 02/20/2017 with severe pain on the right side of his chest.  He had a CT angiogram of the chest on 02/20/2017 which demonstrated acute bilateral pulmonary emboli within the right main pulmonary artery; right upper and right lower lobar arteries, extending into right upper and lower lobe segmental branches; and left lower lobe segmental arteries. No CT evidence of right heart strain. Posterior right upper lobe peripheral opacities likely indicate an area of infarcted lung.  He then had a Doppler venous ultrasound of bilateral lower extremities which demonstrated right leg positive for subacute DVT in the popliteal vein and posterior tibial and peroneal veins. Left leg, negative for deep and superficial vein thrombosis.   Patient was treated with a heparin gtt while inpatient and upon discharge on 02/22/17 he was transitioned to xarelto. He has been on Xarelto since April and has not had any problems with [place] bleeding.  Patient states that prior to his diagnosis of PE/DVT, he was more sedentary due to the severity of his back pain and sciatica.  Patient had noticed swelling in his bilateral legs for a few weeks right worse than the left.  He denies any history of travel including road trips or air flights.  He does have a history of HIV but states that his viral load has been undetectable for years.  It appears that he may have had a hypercoagulable workup performed by his primary care physician, since his last PCP note  stated patient elevated protein C and Antithrombin III activity level, however I do not have any of these labs available to me. This was his first episode of PE/DVT. No family history of thrombophilia to his knowledge.  Today he states that he feels very debilitated by his back pain and sciatica. He has occasional swelling in his feet.  He states that he has occasional shortness of breath.  Denies any chest pain, palpitations, abdominal pain, constipation, diarrhea, nausea, vomiting, headaches, dizziness.  He smokes 1 pack/day.    INTERVAL HISTORY:  Devin [name] 56 y.o. male returns for routine follow-up for right leg DVT and bilateral PEs.   His biggest complaint today is cough and congestion; he recently received antibiotics from his PCP. He has not tried any OTC expectorants "because I don't know how they will react with my HIV meds."  He feels short of breath "always."    Remains on Xarelto with good tolerance. Denies any bleeding episodes. He has leg swelling, which has been chronic. He is not very active, but is trying to do better with his activity. He walks with cane.  Denies any skin changes/redness/warmth to his bilateral lower extremities.  He feels tired; appetite 25% and "no energy" per his report. He has peripheral neuropathy, which is also chronic.      REVIEW OF SYSTEMS:  Review of Systems  Constitutional: Positive for diaphoresis and fatigue. Negative for chills and fever (denies any recent fevers).  HENT:  Negative.   Eyes: Negative.  Respiratory: Positive for cough and shortness of breath.   Cardiovascular: Positive for leg swelling.  Gastrointestinal: Negative.   Endocrine: Negative.   Genitourinary: Negative.    Musculoskeletal: Positive for arthralgias (back, legs, and feet pain).  Skin: Negative.   Neurological: Positive for numbness.  Hematological: Bruises/bleeds easily.  Psychiatric/Behavioral: Negative.      PAST MEDICAL/SURGICAL HISTORY:  Past Medical  History:  Diagnosis Date  . Anxiety   . Arthritis    "I'm eat up w/it" (02/20/2017)  . Asthma   . Chronic back pain    "the whole back" (02/20/2017)  . Chronic bronchitis (HCC)   . Complication of anesthesia    "felt like I couldn't breath coming out of it"  . COPD (chronic obstructive pulmonary disease) (HCC)   . Depression   . GERD (gastroesophageal reflux disease)   . History of hiatal hernia   . History of stomach ulcers    "bleeding ones; I was young then"  . HIV infection (HCC) dx'd ~ 1999  . Hypertension   . Pneumonia    "several times" (02/20/2017)  . Prolapsed disk 10/28  . Pulmonary embolism (HCC) 02/20/2017  . Scoliosis 08/24/13   Past Surgical History:  Procedure Laterality Date  . KNEE ARTHROSCOPY Right 1980s     SOCIAL HISTORY:  Social History   Socioeconomic History  . Marital status: Divorced    Spouse name: Not on file  . Number of children: Not on file  . Years of education: Not on file  . Highest education level: Not on file  Social Needs  . Financial resource strain: Not on file  . Food insecurity - worry: Not on file  . Food insecurity - inability: Not on file  . Transportation needs - medical: Not on file  . Transportation needs - non-medical: Not on file  Occupational History  . Not on file  Tobacco Use  . Smoking status: Current Every Day Smoker    Packs/day: 1.00    Years: 48.00    Pack years: 48.00    Types: Cigarettes  . Smokeless tobacco: Never Used  Substance and Sexual Activity  . Alcohol use: Yes    Comment: 02/20/2017 "nothing in  1 1/2 to 2 years"  . Drug use: Yes    Frequency: 1.0 times per week    Types: Marijuana    Comment: 02/20/2017 "weekly"  . Sexual activity: Yes    Partners: Female    Comment: pt. given condoms  Other Topics Concern  . Not on file  Social History Narrative  . Not on file    FAMILY HISTORY:  Family History  Problem Relation Age of Onset  . Diabetes Father   . Stroke Other   . Colon cancer  Neg Hx     CURRENT MEDICATIONS:  Outpatient Encounter Medications as of 10/08/2017  Medication Sig Note  . albuterol (PROVENTIL HFA;VENTOLIN HFA) 108 (90 BASE) MCG/ACT inhaler Inhale 2 puffs into the lungs every 4 (four) hours as needed for wheezing or shortness of breath.   Marland Kitchen albuterol (PROVENTIL) (2.5 MG/3ML) 0.083% nebulizer solution Take 2.5 mg by nebulization every 6 (six) hours as needed for wheezing or shortness of breath.   Marland Kitchen amitriptyline (ELAVIL) 50 MG tablet Take 50 mg by mouth at bedtime as needed for sleep.  08/28/2014: .  Marland Kitchen COMBIVENT RESPIMAT 20-100 MCG/ACT AERS respimat Inhale 2 puffs into the lungs every 4 (four) hours as needed. wheezing 08/28/2014: .  . cyclobenzaprine (FLEXERIL) 10 MG tablet Take 10  mg by mouth 3 (three) times daily as needed for muscle spasms.   . diazepam (VALIUM) 10 MG tablet Take 10 mg by mouth 3 (three) times daily as needed for anxiety.    . elvitegravir-cobicistat-emtricitabine-tenofovir (GENVOYA) 150-150-200-10 MG TABS tablet Take 1 tablet daily with breakfast by mouth.   . gabapentin (NEURONTIN) 600 MG tablet Take 600 mg by mouth every 4 (four) hours as needed (pain).   Marland Kitchen. levofloxacin (LEVAQUIN) 750 MG tablet Take 1 tablet (750 mg total) by mouth daily. (Patient not taking: Reported on 09/10/2017)   . nicotine (NICODERM CQ - DOSED IN MG/24 HOURS) 21 mg/24hr patch Place 1 patch (21 mg total) onto the skin daily. (Patient not taking: Reported on 09/10/2017)   . omeprazole (PRILOSEC) 40 MG capsule Take 40 mg by mouth daily.     Marland Kitchen. oxyCODONE (OXYCONTIN) 15 mg 12 hr tablet Take 1 tablet (15 mg total) by mouth every 12 (twelve) hours.   Marland Kitchen. oxyCODONE-acetaminophen (PERCOCET/ROXICET) 5-325 MG per tablet Take 1 tablet by mouth every 4 (four) hours as needed for severe pain.   . pantoprazole (PROTONIX) 40 MG tablet Take 1 tablet (40 mg total) by mouth daily.   Marland Kitchen. torsemide (DEMADEX) 20 MG tablet Take 20 mg by mouth daily.    No facility-administered encounter  medications on file as of 10/08/2017.     ALLERGIES:  No Known Allergies   PHYSICAL EXAM:  ECOG Performance status: 1 - Symptomatic; remains largely independent   There were no vitals filed for this visit. There were no vitals filed for this visit.  Physical Exam  Constitutional: He is oriented to person, place, and time and well-developed, well-nourished, and in no distress.  HENT:  Head: Normocephalic.  Mouth/Throat: Oropharynx is clear and moist. No oropharyngeal exudate.  Eyes: Conjunctivae are normal. Pupils are equal, round, and reactive to light. No scleral icterus.  Neck: Normal range of motion. Neck supple.  Cardiovascular: Normal rate and regular rhythm.  Pulmonary/Chest: Effort normal. No respiratory distress.  Course rhonchi noted to bilat upper lobes (encouraged him to try OTC Mucinex)  Abdominal: Soft. Bowel sounds are normal. There is no tenderness.  Musculoskeletal: He exhibits edema (1+ BLE edema; left slightly worse than right ).  Ambulates with cane  Lymphadenopathy:    He has no cervical adenopathy.  Neurological: He is alert and oriented to person, place, and time. No cranial nerve deficit.  Skin: Skin is warm and dry. No rash noted.  Psychiatric: Mood, memory, affect and judgment normal.  Nursing note and vitals reviewed.    LABORATORY DATA:  I have reviewed the labs as listed.  CBC    Component Value Date/Time   WBC 8.8 09/10/2017 1038   RBC 4.90 09/10/2017 1038   HGB 16.3 09/10/2017 1038   HCT 45.5 09/10/2017 1038   PLT 225 09/10/2017 1038   MCV 92.9 09/10/2017 1038   MCH 33.3 (H) 09/10/2017 1038   MCHC 35.8 09/10/2017 1038   RDW 12.4 09/10/2017 1038   LYMPHSABS 2.3 08/21/2017 1421   MONOABS 0.4 08/21/2017 1421   EOSABS 0.2 08/21/2017 1421   BASOSABS 0.0 08/21/2017 1421   CMP Latest Ref Rng & Units 09/10/2017 08/21/2017 02/22/2017  Glucose 65 - 99 mg/dL 098(J111(H) 191(Y139(H) 782(N108(H)  BUN 7 - 25 mg/dL 8 6 16   Creatinine 0.70 - 1.33 mg/dL 5.620.96 1.300.98  8.65(H1.43(H)  Sodium 135 - 146 mmol/L 140 137 138  Potassium 3.5 - 5.3 mmol/L 4.2 3.6 4.0  Chloride 98 - 110 mmol/L 107  101 101  CO2 20 - 32 mmol/L 23 26 28   Calcium 8.6 - 10.3 mg/dL 9.9 9.1 9.5  Total Protein 6.1 - 8.1 g/dL 7.5 7.3 7.1  Total Bilirubin 0.2 - 1.2 mg/dL 0.5 0.3 0.3  Alkaline Phos 38 - 126 U/L - 50 52  AST 10 - 35 U/L 31 30 19   ALT 9 - 46 U/L 23 24 12(L)    PENDING LABS:    DIAGNOSTIC IMAGING:  *The following radiologic images and reports have been reviewed independently and agree with below findings.  CTA chest: 08/21/17 CLINICAL DATA:  Evaluate for residual pulmonary embolism status post treatment.  EXAM: CT ANGIOGRAPHY CHEST WITH CONTRAST  TECHNIQUE: Multidetector CT imaging of the chest was performed using the standard protocol during bolus administration of intravenous contrast. Multiplanar CT image reconstructions and MIPs were obtained to evaluate the vascular anatomy.  CONTRAST:  100 cc Isovue 370 intravenous contrast.  COMPARISON:  CTA chest dated February 20, 2017.  FINDINGS: Cardiovascular: Satisfactory opacification of the pulmonary arteries to the segmental level. No evidence of pulmonary embolism. Normal heart size. No pericardial effusion.  Mediastinum/Nodes: No enlarged mediastinal, hilar, or axillary lymph nodes. Thyroid gland, trachea, and esophagus demonstrate no significant findings.  Lungs/Pleura: Nearly resolved infarcts versus scarring in the right upper lobe. No suspicious pulmonary nodule. No pleural effusion or pneumothorax.  Upper Abdomen: No acute abnormality.  Musculoskeletal: No chest wall abnormality. No acute or significant osseous findings.  Review of the MIP images confirms the above findings.  IMPRESSION: 1. Resolved bilateral pulmonary emboli. Nearly resolved pulmonary infarcts versus scarring in the right upper lobe.   Electronically Signed   By: Obie Dredge M.D.   On: 08/21/2017  15:54    Ultrasound bilat LE: 08/21/17 CLINICAL DATA:  Bilateral lower extremity edema, right greater than left. History of pulmonary embolism in April 2018.  EXAM: BILATERAL LOWER EXTREMITY VENOUS DOPPLER ULTRASOUND  TECHNIQUE: Gray-scale sonography with graded compression, as well as color Doppler and duplex ultrasound were performed to evaluate the lower extremity deep venous systems from the level of the common femoral vein and including the common femoral, femoral, profunda femoral, popliteal and calf veins including the posterior tibial, peroneal and gastrocnemius veins when visible. The superficial great saphenous vein was also interrogated. Spectral Doppler was utilized to evaluate flow at rest and with distal augmentation maneuvers in the common femoral, femoral and popliteal veins.  COMPARISON:  None.  FINDINGS: RIGHT LOWER EXTREMITY  Common Femoral Vein: No evidence of thrombus. Normal compressibility, respiratory phasicity and response to augmentation.  Saphenofemoral Junction: No evidence of thrombus. Normal compressibility and flow on color Doppler imaging.  Profunda Femoral Vein: No evidence of thrombus. Normal compressibility and flow on color Doppler imaging.  Femoral Vein: No evidence of thrombus. Normal compressibility, respiratory phasicity and response to augmentation.  Popliteal Vein: No evidence of thrombus. Normal compressibility, respiratory phasicity and response to augmentation.  Calf Veins: No evidence of thrombus. Normal compressibility and flow on color Doppler imaging.  Superficial Great Saphenous Vein: No evidence of thrombus. Normal compressibility.  Venous Reflux:  None.  Other Findings:  None.  LEFT LOWER EXTREMITY  Common Femoral Vein: No evidence of thrombus. Normal compressibility, respiratory phasicity and response to augmentation.  Saphenofemoral Junction: No evidence of thrombus. Normal compressibility  and flow on color Doppler imaging.  Profunda Femoral Vein: No evidence of thrombus. Normal compressibility and flow on color Doppler imaging.  Femoral Vein: No evidence of thrombus. Normal compressibility, respiratory phasicity and response to  augmentation.  Popliteal Vein: No evidence of thrombus. Normal compressibility, respiratory phasicity and response to augmentation.  Calf Veins: No evidence of thrombus. Normal compressibility and flow on color Doppler imaging.  Superficial Great Saphenous Vein: No evidence of thrombus. Normal compressibility.  Venous Reflux:  None.  Other Findings:  None.  IMPRESSION: No evidence of deep venous thrombosis in either lower extremity.   Electronically Signed   By: Delbert PhenixJason A Poff M.D.   On: 08/21/2017 15:23     PATHOLOGY:     ASSESSMENT & PLAN:   (R) LE DVT and bilateral PEs:  -PEs & DVT diagnosed in 01/2017; he was admitted to hospital for anticoagulation where he received Heparin infusion, followed by transition to oral Xarelto.  He has been on Xarelto since 01/2017.   -Possibility that DVT/PE was provoked given recent sedentary lifestyle in weeks leading up to thrombotic events; he also smokes and is HIV+ which puts him at higher risk for thrombosis as well.  He reportedly had a prior hypercoagulable work-up with his PCP; we have requested those results.  -Repeat CTA chest and doppler ultrasound of bilateral lower extremities are negative for any residual clot.  These results were reviewed in detail with patient today; he was provided a copy of the radiologic reports as well.  -Discussed with Dr. Janyth ContesZhou.  Given that this was his first possibly provoked thrombotic event, without evidence of residual clot on imaging, we can stop anti-coagulation at this time.  Would recommend that we repeat hypercoagulable work-up in the future when he is off of Xarelto, as being on anticoagulation can impact these results.  He agrees to this plan.   -Labs only in ~1 month.  -Return to cancer center in ~6-7 weeks for follow-up after labs have had adequate time to be resulted.  Gave him strict instructions to contact us and/or go to closest ED if he experiences any LE edema, shortness of breath, or chest pain.  Encouraged him to increase his physical activity as tolerated.    Cough/congestion:  -Encouraged continued follow-up with PCP for persistent symptoms. He currently smokes ~1 ppd (down from 2-3 ppd). He is working on quitting smoking. Provided support today for smoking cessation.  -Recommended he talk with pharmacy at retail pharmacy about OTC Mucinex taken with his HIV medications to ensure compatibility.       Dispo:  -Stop Xarelto today.  Return for labs only in ~1 month (hypercoag panel, CBC with diff, and CMET orders placed today).  -Return to cancer center in 6-7 weeks for follow-up to review results and finalize treatment plan.    All questions were answered to patient's stated satisfaction. Encouraged patient to call with any new concerns or questions before his next visit to the cancer center and we can certain see him sooner, if needed.    Plan of care discussed with Dr. Janyth ContesZhou, who agrees with the above aforementioned.    Orders placed this encounter:  No orders of the defined types were placed in this encounter.     Lubertha BasqueGretchen Dawson, NP [facility] [phone]

## 2017-10-08 ENCOUNTER — Ambulatory Visit (HOSPITAL_COMMUNITY): Payer: Medicare Other | Admitting: Adult Health

## 2017-10-17 ENCOUNTER — Ambulatory Visit (HOSPITAL_COMMUNITY)
Admission: RE | Admit: 2017-10-17 | Discharge: 2017-10-17 | Disposition: A | Discharge status: Home / Self Care | Payer: Medicare Other | Source: Ambulatory Visit | Attending: Family Medicine | Admitting: Family Medicine

## 2017-10-17 ENCOUNTER — Other Ambulatory Visit (HOSPITAL_COMMUNITY): Payer: Self-pay | Admitting: Family Medicine

## 2017-10-17 ENCOUNTER — Other Ambulatory Visit (HOSPITAL_COMMUNITY)
Admission: RE | Admit: 2017-10-17 | Discharge: 2017-10-17 | Disposition: A | Discharge status: Home / Self Care | Payer: Medicare Other | Source: Ambulatory Visit | Attending: Family Medicine | Admitting: Family Medicine

## 2017-10-17 DIAGNOSIS — R071 Chest pain on breathing: Secondary | ICD-10-CM | POA: Diagnosis not present

## 2017-10-17 DIAGNOSIS — Z86711 Personal history of pulmonary embolism: Secondary | ICD-10-CM | POA: Insufficient documentation

## 2017-10-17 DIAGNOSIS — J189 Pneumonia, unspecified organism: Secondary | ICD-10-CM

## 2017-10-17 DIAGNOSIS — J984 Other disorders of lung: Secondary | ICD-10-CM | POA: Insufficient documentation

## 2017-10-17 DIAGNOSIS — Z8701 Personal history of pneumonia (recurrent): Secondary | ICD-10-CM | POA: Insufficient documentation

## 2017-10-17 LAB — CBC
HCT: 46.4 % (ref 39.0–52.0)
HEMOGLOBIN: 15.9 g/dL (ref 13.0–17.0)
MCH: 32.9 pg (ref 26.0–34.0)
MCHC: 34.3 g/dL (ref 30.0–36.0)
MCV: 95.9 fL (ref 78.0–100.0)
PLATELETS: 205 10*3/uL (ref 150–400)
RBC: 4.84 MIL/uL (ref 4.22–5.81)
RDW: 12 % (ref 11.5–15.5)
WBC: 7.6 10*3/uL (ref 4.0–10.5)

## 2017-10-17 LAB — D-DIMER, QUANTITATIVE (NOT AT ARMC): D DIMER QUANT: 0.29 ug{FEU}/mL (ref 0.00–0.50)

## 2017-10-24 ENCOUNTER — Ambulatory Visit (HOSPITAL_COMMUNITY): Payer: Medicare Other | Admitting: Oncology

## 2017-10-24 ENCOUNTER — Other Ambulatory Visit (HOSPITAL_COMMUNITY): Payer: Medicare Other

## 2017-10-27 ENCOUNTER — Encounter (HOSPITAL_COMMUNITY): Payer: Medicare Other | Attending: Adult Health

## 2017-10-27 DIAGNOSIS — I2699 Other pulmonary embolism without acute cor pulmonale: Secondary | ICD-10-CM | POA: Diagnosis present

## 2017-10-27 LAB — COMPREHENSIVE METABOLIC PANEL
ALT: 22 U/L (ref 17–63)
AST: 32 U/L (ref 15–41)
Albumin: 4.4 g/dL (ref 3.5–5.0)
Alkaline Phosphatase: 48 U/L (ref 38–126)
Anion gap: 10 (ref 5–15)
BUN: 11 mg/dL (ref 6–20)
CHLORIDE: 105 mmol/L (ref 101–111)
CO2: 25 mmol/L (ref 22–32)
CREATININE: 1.13 mg/dL (ref 0.61–1.24)
Calcium: 9.4 mg/dL (ref 8.9–10.3)
GFR calc Af Amer: >60 mL/min (ref >60)
GFR calc non Af Amer: >60 mL/min (ref >60)
Glucose, Bld: 130 mg/dL — ABNORMAL HIGH (ref 65–99)
POTASSIUM: 3.7 mmol/L (ref 3.5–5.1)
SODIUM: 140 mmol/L (ref 135–145)
Total Bilirubin: 0.6 mg/dL (ref 0.3–1.2)
Total Protein: 7.9 g/dL (ref 6.5–8.1)

## 2017-10-27 LAB — CBC WITH DIFFERENTIAL/PLATELET
BASOS ABS: 0 10*3/uL (ref 0.0–0.1)
BASOS PCT: 0 %
EOS ABS: 0.2 10*3/uL (ref 0.0–0.7)
EOS PCT: 3 %
HCT: 44.2 % (ref 39.0–52.0)
Hemoglobin: 15.1 g/dL (ref 13.0–17.0)
Lymphocytes Relative: 40 %
Lymphs Abs: 2.3 10*3/uL (ref 0.7–4.0)
MCH: 32.7 pg (ref 26.0–34.0)
MCHC: 34.2 g/dL (ref 30.0–36.0)
MCV: 95.7 fL (ref 78.0–100.0)
Monocytes Absolute: 0.3 10*3/uL (ref 0.1–1.0)
Monocytes Relative: 6 %
Neutro Abs: 2.9 10*3/uL (ref 1.7–7.7)
Neutrophils Relative %: 51 %
PLATELETS: 177 10*3/uL (ref 150–400)
RBC: 4.62 MIL/uL (ref 4.22–5.81)
RDW: 12.1 % (ref 11.5–15.5)
WBC: 5.6 10*3/uL (ref 4.0–10.5)

## 2017-10-27 LAB — ANTITHROMBIN III: AntiThromb III Func: 102 % (ref 75–120)

## 2017-10-28 HISTORY — PX: BACK SURGERY: SHX140

## 2017-10-29 LAB — HOMOCYSTEINE: Homocysteine: 12.5 umol/L (ref 0.0–15.0)

## 2017-10-29 LAB — BETA-2-GLYCOPROTEIN I ABS, IGG/M/A

## 2017-10-29 LAB — PROTEIN C, TOTAL: Protein C, Total: 146 % (ref 60–150)

## 2017-10-30 LAB — CARDIOLIPIN ANTIBODIES, IGG, IGM, IGA

## 2017-10-31 LAB — PROTHROMBIN GENE MUTATION

## 2017-10-31 LAB — FACTOR 5 LEIDEN

## 2017-11-03 MED FILL — GENVOYA TABLET: 150-150-200 | 30 days supply | Qty: 30 | Fill #1

## 2017-11-04 LAB — LUPUS ANTICOAGULANT PANEL
DRVVT: 51.6 s — ABNORMAL HIGH (ref 0.0–47.0)
PTT Lupus Anticoagulant: 40.8 s (ref 0.0–51.9)

## 2017-11-04 LAB — PROTEIN S, TOTAL: Protein S Ag, Total: 145 % (ref 60–150)

## 2017-11-04 LAB — PROTEIN S ACTIVITY: PROTEIN S ACTIVITY: 103 % (ref 63–140)

## 2017-11-04 LAB — PROTEIN C ACTIVITY: Protein C Activity: 138 % (ref 73–180)

## 2017-11-04 LAB — DRVVT MIX: dRVVT Mix: 43 s (ref 0.0–47.0)

## 2017-11-11 ENCOUNTER — Ambulatory Visit (HOSPITAL_COMMUNITY): Payer: Medicare Other | Admitting: Oncology

## 2017-12-03 MED FILL — GENVOYA TABLET: 150-150-200 | 30 days supply | Qty: 30 | Fill #2

## 2017-12-05 DIAGNOSIS — Z9189 Other specified personal risk factors, not elsewhere classified: Secondary | ICD-10-CM | POA: Insufficient documentation

## 2017-12-19 ENCOUNTER — Other Ambulatory Visit: Payer: Self-pay | Admitting: Pharmacist

## 2018-01-02 MED FILL — GENVOYA TABLET: 150-150-200 | 30 days supply | Qty: 30 | Fill #3

## 2018-01-23 DIAGNOSIS — Z981 Arthrodesis status: Secondary | ICD-10-CM | POA: Insufficient documentation

## 2018-01-29 MED FILL — GENVOYA TABLET: 150-150-200 | 30 days supply | Qty: 30 | Fill #4

## 2018-01-30 ENCOUNTER — Other Ambulatory Visit (HOSPITAL_COMMUNITY): Payer: Self-pay | Admitting: Family Medicine

## 2018-01-30 DIAGNOSIS — M79604 Pain in right leg: Secondary | ICD-10-CM

## 2018-01-30 DIAGNOSIS — Z9889 Other specified postprocedural states: Secondary | ICD-10-CM

## 2018-01-30 DIAGNOSIS — M79605 Pain in left leg: Principal | ICD-10-CM

## 2018-02-06 ENCOUNTER — Ambulatory Visit (HOSPITAL_COMMUNITY): Admission: RE | Admit: 2018-02-06 | Payer: Medicare Other | Source: Ambulatory Visit

## 2018-02-24 MED FILL — GENVOYA TABLET: 150-150-200 | 30 days supply | Qty: 30 | Fill #5

## 2018-03-11 ENCOUNTER — Ambulatory Visit (HOSPITAL_COMMUNITY)
Admission: RE | Admit: 2018-03-11 | Discharge: 2018-03-11 | Disposition: A | Discharge status: Home / Self Care | Payer: Medicare Other | Source: Ambulatory Visit | Attending: Family Medicine | Admitting: Family Medicine

## 2018-03-11 DIAGNOSIS — M79604 Pain in right leg: Secondary | ICD-10-CM | POA: Insufficient documentation

## 2018-03-11 DIAGNOSIS — Z9889 Other specified postprocedural states: Secondary | ICD-10-CM | POA: Insufficient documentation

## 2018-03-11 DIAGNOSIS — M79605 Pain in left leg: Secondary | ICD-10-CM | POA: Insufficient documentation

## 2018-03-16 ENCOUNTER — Ambulatory Visit: Payer: Medicare Other | Admitting: Infectious Diseases

## 2018-04-03 MED FILL — GENVOYA TABLET: 150-150-200 | 30 days supply | Qty: 30 | Fill #6

## 2018-05-05 ENCOUNTER — Other Ambulatory Visit: Payer: Self-pay | Admitting: Infectious Diseases

## 2018-05-05 DIAGNOSIS — B20 Human immunodeficiency virus [HIV] disease: Secondary | ICD-10-CM

## 2018-05-07 MED FILL — GENVOYA TABLET: 150-150-200 | 30 days supply | Qty: 30 | Fill #0

## 2018-06-03 ENCOUNTER — Other Ambulatory Visit: Payer: Self-pay | Admitting: Infectious Diseases

## 2018-06-03 DIAGNOSIS — B20 Human immunodeficiency virus [HIV] disease: Secondary | ICD-10-CM

## 2018-06-15 ENCOUNTER — Other Ambulatory Visit: Payer: Self-pay | Admitting: Infectious Diseases

## 2018-06-15 DIAGNOSIS — B20 Human immunodeficiency virus [HIV] disease: Secondary | ICD-10-CM

## 2018-06-15 MED FILL — GENVOYA TABLET: 150-150-200 | 30 days supply | Qty: 30 | Fill #0

## 2018-06-25 ENCOUNTER — Other Ambulatory Visit (HOSPITAL_COMMUNITY)
Admission: RE | Admit: 2018-06-25 | Discharge: 2018-06-25 | Disposition: A | Discharge status: Home / Self Care | Payer: Medicare Other | Source: Ambulatory Visit | Attending: Infectious Diseases | Admitting: Infectious Diseases

## 2018-06-25 ENCOUNTER — Ambulatory Visit (INDEPENDENT_AMBULATORY_CARE_PROVIDER_SITE_OTHER): Payer: Medicare Other | Admitting: Infectious Diseases

## 2018-06-25 ENCOUNTER — Encounter: Payer: Self-pay | Admitting: Infectious Diseases

## 2018-06-25 VITALS — BP 164/103 | HR 74 | Temp 97.8°F | Wt 206.0 lb

## 2018-06-25 DIAGNOSIS — J449 Chronic obstructive pulmonary disease, unspecified: Secondary | ICD-10-CM

## 2018-06-25 DIAGNOSIS — Z113 Encounter for screening for infections with a predominantly sexual mode of transmission: Secondary | ICD-10-CM | POA: Diagnosis present

## 2018-06-25 DIAGNOSIS — B20 Human immunodeficiency virus [HIV] disease: Secondary | ICD-10-CM | POA: Diagnosis not present

## 2018-06-25 DIAGNOSIS — Z23 Encounter for immunization: Secondary | ICD-10-CM

## 2018-06-25 DIAGNOSIS — Z79899 Other long term (current) drug therapy: Secondary | ICD-10-CM

## 2018-06-25 DIAGNOSIS — Z720 Tobacco use: Secondary | ICD-10-CM | POA: Diagnosis not present

## 2018-06-25 DIAGNOSIS — I10 Essential (primary) hypertension: Secondary | ICD-10-CM

## 2018-06-25 DIAGNOSIS — M41125 Adolescent idiopathic scoliosis, thoracolumbar region: Secondary | ICD-10-CM

## 2018-06-25 MED ORDER — PREGABALIN 100 MG PO CAPS: 100.0000 mg | ORAL_CAPSULE | Freq: Two times a day (BID) | ORAL | 3 refills | Status: DC

## 2018-06-25 NOTE — Assessment & Plan Note (Signed)
Elevated today, suspect due to pain.  Will continue to watch.

## 2018-06-25 NOTE — Addendum Note (Signed)
Addended by: Lorenso CourierMALDONADO, JOSE L on: 06/25/2018 11:45 AM   Modules accepted: Orders

## 2018-06-25 NOTE — Assessment & Plan Note (Signed)
He had Lumbar-sacral fusion.  He has continued pain He is dissapointed that he has not had scolioisis repair.  Encouraged him to f/u with PT, ortho.

## 2018-06-25 NOTE — Addendum Note (Signed)
Addended by: Tyrah Broers C on: 06/25/2018 11:25 AM   Modules accepted: Orders

## 2018-06-25 NOTE — Assessment & Plan Note (Signed)
Appears to be stable on his current regimen.

## 2018-06-25 NOTE — Assessment & Plan Note (Signed)
Encouraged to quit smoking.  

## 2018-06-25 NOTE — Progress Notes (Signed)
   Subjective:    Patient ID: Devin [name], male    DOB: 1961/03/12, 57 y.o.   MRN: [id number]  HPI 57yo M with HIV+ dx 1995, and hx of COPD. Maintained on genvoya (prev took combivir/efavirenz). Since his last visit he had back surgery- L5-S1 fusion ("artificial disks, bone grafts"). Continues to have discomfort.    No problems with Genvoya.  Still smoking- 1ppd. Down from 2ppd.  Wt down 7 #. Has been trying to lose wt.  Uses inhalers at night. Scheduled.  Wife has been on PReP  HIV 1 RNA Quant (copies/mL)  Date Value  09/10/2017 <20 NOT DETECTED  08/21/2016 <20  12/01/2013 <20   CD4 T Cell Abs (/uL)  Date Value  09/10/2017 900  08/21/2016 1,110  12/01/2013 870    Review of Systems  Constitutional: Positive for unexpected weight change. Negative for chills and fever.  Gastrointestinal: Negative for constipation and diarrhea.  Genitourinary: Negative for difficulty urinating.  Musculoskeletal: Positive for back pain.  Psychiatric/Behavioral: Positive for sleep disturbance.  sleep disturbance due to pian.  Please see HPI. All other systems reviewed and negative.     Objective:   Physical Exam  Constitutional: He appears well-developed and well-nourished. He appears distressed.  HENT:  Mouth/Throat: No oropharyngeal exudate.  Eyes: Pupils are equal, round, and reactive to light. EOM are normal.  Neck: Normal range of motion. Neck supple.  Cardiovascular: Normal rate, regular rhythm and normal heart sounds.  Pulmonary/Chest: Effort normal and breath sounds normal.  Abdominal: Soft. Bowel sounds are normal. He exhibits no distension. There is no tenderness.  Musculoskeletal: He exhibits no edema.  Lymphadenopathy:    He has no cervical adenopathy.  Skin:         Assessment & Plan:

## 2018-06-25 NOTE — Assessment & Plan Note (Signed)
He is doing well Will recheck his labs His wife has been on prep.  PCV today rtc in 9 months.

## 2018-06-26 LAB — URINE CYTOLOGY ANCILLARY ONLY
CHLAMYDIA, DNA PROBE: NEGATIVE
Neisseria Gonorrhea: NEGATIVE

## 2018-06-27 LAB — COMPREHENSIVE METABOLIC PANEL WITH GFR
AG Ratio: 1.6 (calc) (ref 1.0–2.5)
ALT: 13 U/L (ref 9–46)
AST: 16 U/L (ref 10–35)
Albumin: 4.3 g/dL (ref 3.6–5.1)
Alkaline phosphatase (APISO): 67 U/L (ref 40–115)
BUN: 10 mg/dL (ref 7–25)
CO2: 34 mmol/L — ABNORMAL HIGH (ref 20–32)
Calcium: 9.8 mg/dL (ref 8.6–10.3)
Chloride: 102 mmol/L (ref 98–110)
Creat: 1.22 mg/dL (ref 0.70–1.33)
Globulin: 2.7 g/dL (ref 1.9–3.7)
Glucose, Bld: 119 mg/dL — ABNORMAL HIGH (ref 65–99)
Potassium: 4 mmol/L (ref 3.5–5.3)
Sodium: 141 mmol/L (ref 135–146)
Total Bilirubin: 0.3 mg/dL (ref 0.2–1.2)
Total Protein: 7 g/dL (ref 6.1–8.1)

## 2018-06-27 LAB — CBC
HCT: 43.4 % (ref 38.5–50.0)
Hemoglobin: 15.1 g/dL (ref 13.2–17.1)
MCH: 32.3 pg (ref 27.0–33.0)
MCHC: 34.8 g/dL (ref 32.0–36.0)
MCV: 92.7 fL (ref 80.0–100.0)
MPV: 9.9 fL (ref 7.5–12.5)
Platelets: 187 Thousand/uL (ref 140–400)
RBC: 4.68 Million/uL (ref 4.20–5.80)
RDW: 12.4 % (ref 11.0–15.0)
WBC: 7 Thousand/uL (ref 3.8–10.8)

## 2018-06-27 LAB — HIV-1 RNA QUANT-NO REFLEX-BLD
HIV 1 RNA Quant: <20 {copies}/mL
HIV-1 RNA Quant, Log: <1.3 {Log_copies}/mL

## 2018-06-27 LAB — RPR: RPR Ser Ql: NONREACTIVE

## 2018-07-01 ENCOUNTER — Telehealth: Payer: Self-pay

## 2018-07-01 ENCOUNTER — Telehealth (HOSPITAL_COMMUNITY): Payer: Self-pay | Admitting: Physical Therapy

## 2018-07-01 NOTE — Telephone Encounter (Signed)
Called MD office to let them know that Devin [name] will not schedule PT and that he ordered US to cx this referral. [name] states this MD knows nothing about him. NF 07/01/18

## 2018-07-01 NOTE — Telephone Encounter (Signed)
Pt called and He said he is not coming to this and will not make an appointment. He will call his surgeon Dr. Lorenso Courier if he wants to come here for PT. NF 07/01/2018

## 2018-07-01 NOTE — Telephone Encounter (Signed)
Harriett Sine from [facility] called today regarding referral for patient to receive physical therapy.  Patient refused to schedule an appointment and will reach out to his surgeon to see if he would recommend physical therapy. Harriett Sine will close referral for patient. Lorenso Courier, [place]

## 2018-07-08 ENCOUNTER — Other Ambulatory Visit: Payer: Self-pay | Admitting: Infectious Diseases

## 2018-07-08 DIAGNOSIS — B20 Human immunodeficiency virus [HIV] disease: Secondary | ICD-10-CM

## 2018-07-08 MED FILL — GENVOYA TABLET: 150-150-200 | 30 days supply | Qty: 30 | Fill #0

## 2018-07-21 ENCOUNTER — Other Ambulatory Visit (HOSPITAL_COMMUNITY): Payer: Self-pay | Admitting: Family Medicine

## 2018-07-21 DIAGNOSIS — Z981 Arthrodesis status: Secondary | ICD-10-CM

## 2018-07-23 ENCOUNTER — Ambulatory Visit (HOSPITAL_COMMUNITY): Payer: Medicare Other

## 2018-07-28 ENCOUNTER — Ambulatory Visit (HOSPITAL_COMMUNITY): Payer: Medicare Other

## 2018-07-30 ENCOUNTER — Ambulatory Visit (HOSPITAL_COMMUNITY)
Admission: RE | Admit: 2018-07-30 | Discharge: 2018-07-30 | Disposition: A | Discharge status: Home / Self Care | Payer: Medicare Other | Source: Ambulatory Visit | Attending: Family Medicine | Admitting: Family Medicine

## 2018-07-30 DIAGNOSIS — M4317 Spondylolisthesis, lumbosacral region: Secondary | ICD-10-CM | POA: Insufficient documentation

## 2018-07-30 DIAGNOSIS — Z981 Arthrodesis status: Secondary | ICD-10-CM | POA: Insufficient documentation

## 2018-07-30 DIAGNOSIS — M5126 Other intervertebral disc displacement, lumbar region: Secondary | ICD-10-CM | POA: Insufficient documentation

## 2018-08-10 MED FILL — GENVOYA TABLET: 150-150-200 | 30 days supply | Qty: 30 | Fill #1

## 2018-09-14 MED FILL — GENVOYA TABLET: 150-150-200 | 30 days supply | Qty: 30 | Fill #2

## 2018-10-19 MED FILL — GENVOYA TABLET: 150-150-200 | 30 days supply | Qty: 30 | Fill #3

## 2018-11-19 MED FILL — GENVOYA TABLET: 150-150-200 | 30 days supply | Qty: 30 | Fill #4

## 2018-12-09 ENCOUNTER — Ambulatory Visit (HOSPITAL_COMMUNITY)
Admission: RE | Admit: 2018-12-09 | Discharge: 2018-12-09 | Disposition: A | Discharge status: Home / Self Care | Payer: Medicare Other | Source: Ambulatory Visit | Attending: Family Medicine | Admitting: Family Medicine

## 2018-12-09 ENCOUNTER — Other Ambulatory Visit (HOSPITAL_COMMUNITY)
Admission: AD | Admit: 2018-12-09 | Discharge: 2018-12-09 | Disposition: A | Discharge status: Home / Self Care | Payer: Medicare Other | Source: Skilled Nursing Facility | Attending: *Deleted | Admitting: *Deleted

## 2018-12-09 ENCOUNTER — Other Ambulatory Visit (HOSPITAL_COMMUNITY): Payer: Self-pay | Admitting: Family Medicine

## 2018-12-09 DIAGNOSIS — J189 Pneumonia, unspecified organism: Secondary | ICD-10-CM | POA: Diagnosis present

## 2018-12-09 DIAGNOSIS — R0602 Shortness of breath: Secondary | ICD-10-CM

## 2018-12-12 LAB — CULTURE, RESPIRATORY

## 2018-12-12 LAB — CULTURE, RESPIRATORY W GRAM STAIN: Culture: NORMAL

## 2018-12-18 ENCOUNTER — Telehealth: Payer: Self-pay | Admitting: Pharmacy Technician

## 2018-12-18 NOTE — Telephone Encounter (Signed)
error 

## 2018-12-21 MED FILL — GENVOYA TABLET: 150-150-200 | 30 days supply | Qty: 30 | Fill #5

## 2019-01-19 MED FILL — GENVOYA TABLET: 150-150-200 | 30 days supply | Qty: 30 | Fill #6

## 2019-02-11 ENCOUNTER — Other Ambulatory Visit: Payer: Self-pay | Admitting: Infectious Diseases

## 2019-02-11 DIAGNOSIS — B20 Human immunodeficiency virus [HIV] disease: Secondary | ICD-10-CM

## 2019-02-16 MED FILL — GENVOYA TABLET: 150-150-200 | 30 days supply | Qty: 30 | Fill #0

## 2019-03-19 MED FILL — GENVOYA TABLET: 150-150-200 | 30 days supply | Qty: 30 | Fill #1

## 2019-03-24 IMAGING — DX DG CHEST 2V
2 series · 2 of 2 positions shown · non-contrast
Comparison: CTA chest 08/21/2017, 02/20/2017. Chest x-rays
02/21/2017, 02/20/2017 and earlier.

CLINICAL DATA: Two week history of right-sided chest pain. Personal
history of bilateral pulmonary emboli in January 2017.

EXAM:
CHEST  2 VIEW

[chest pa]
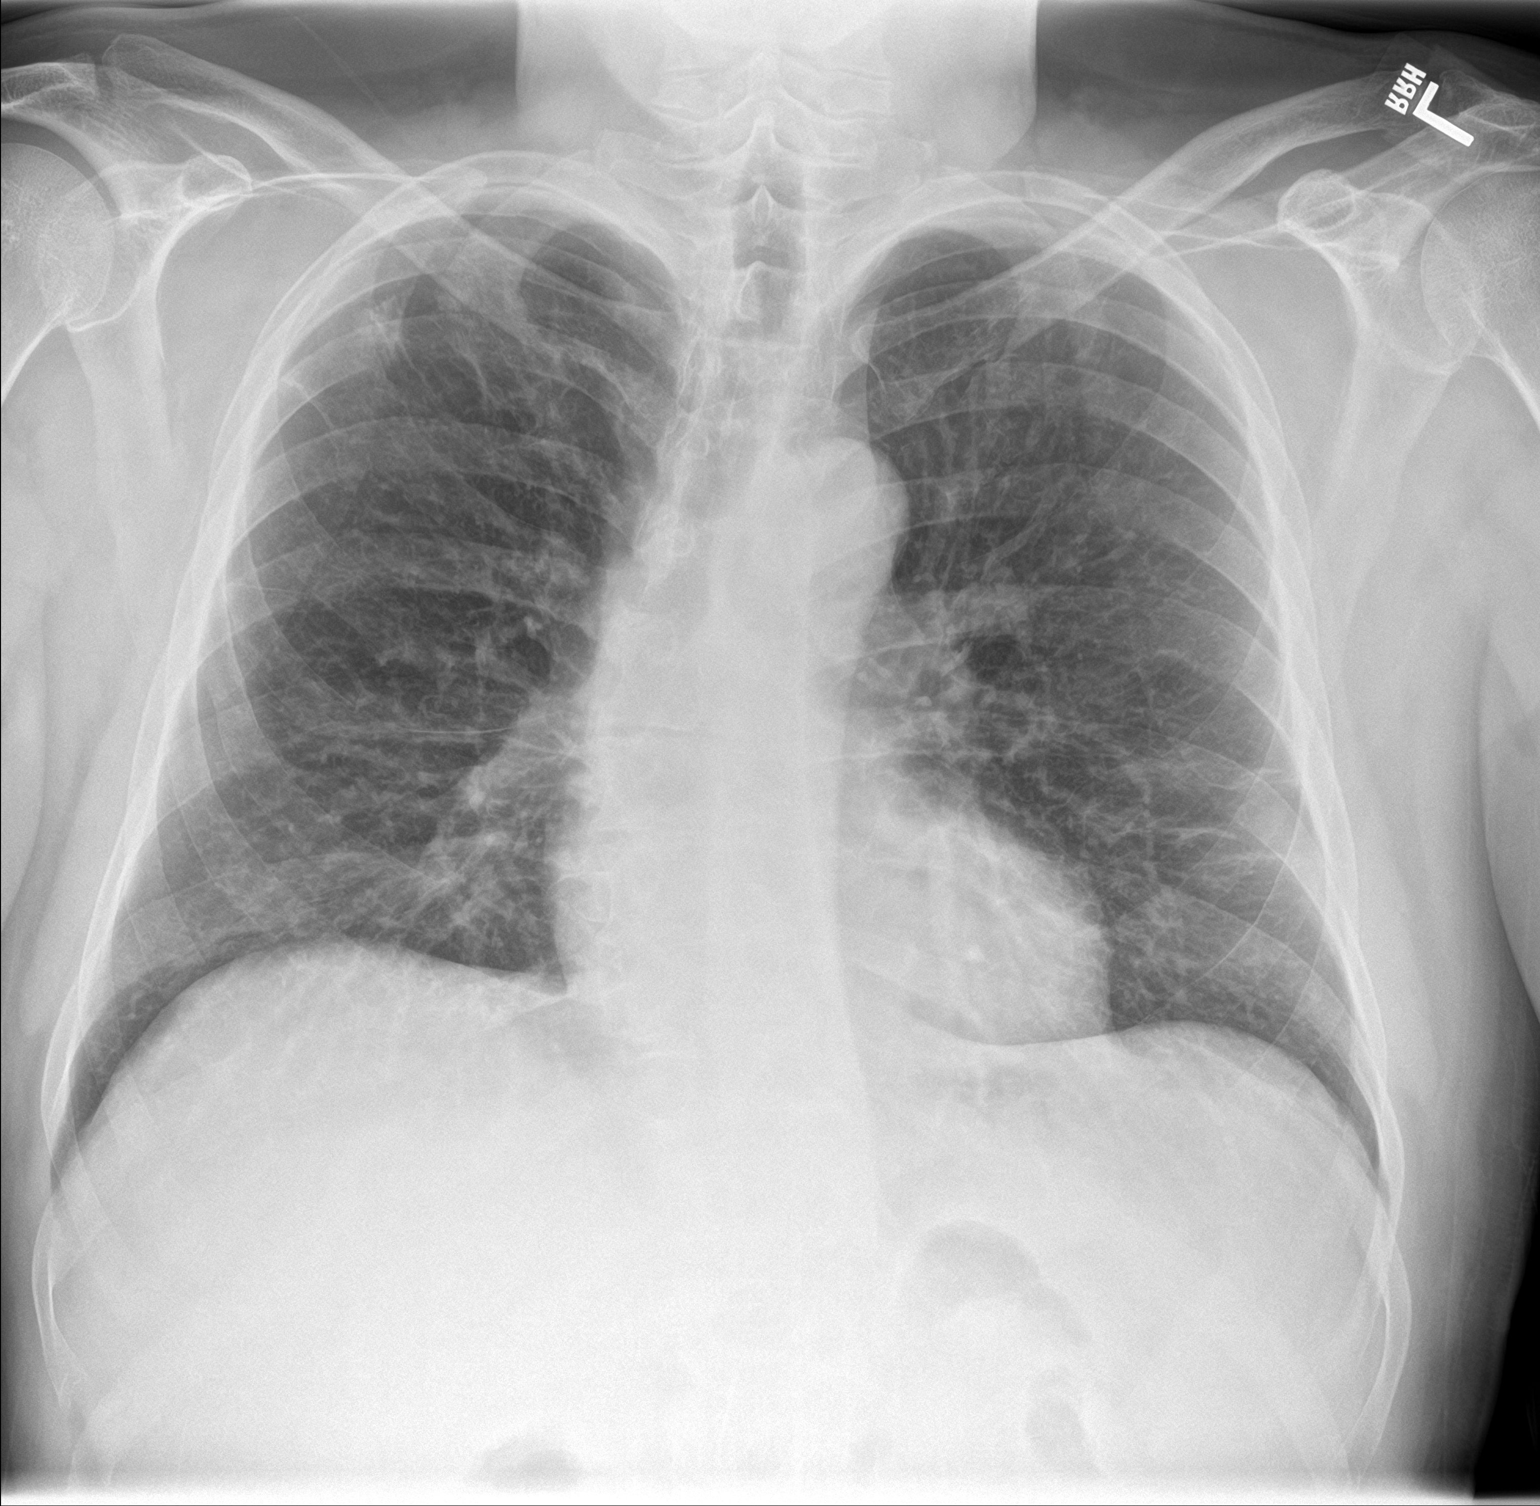

[chest lat]
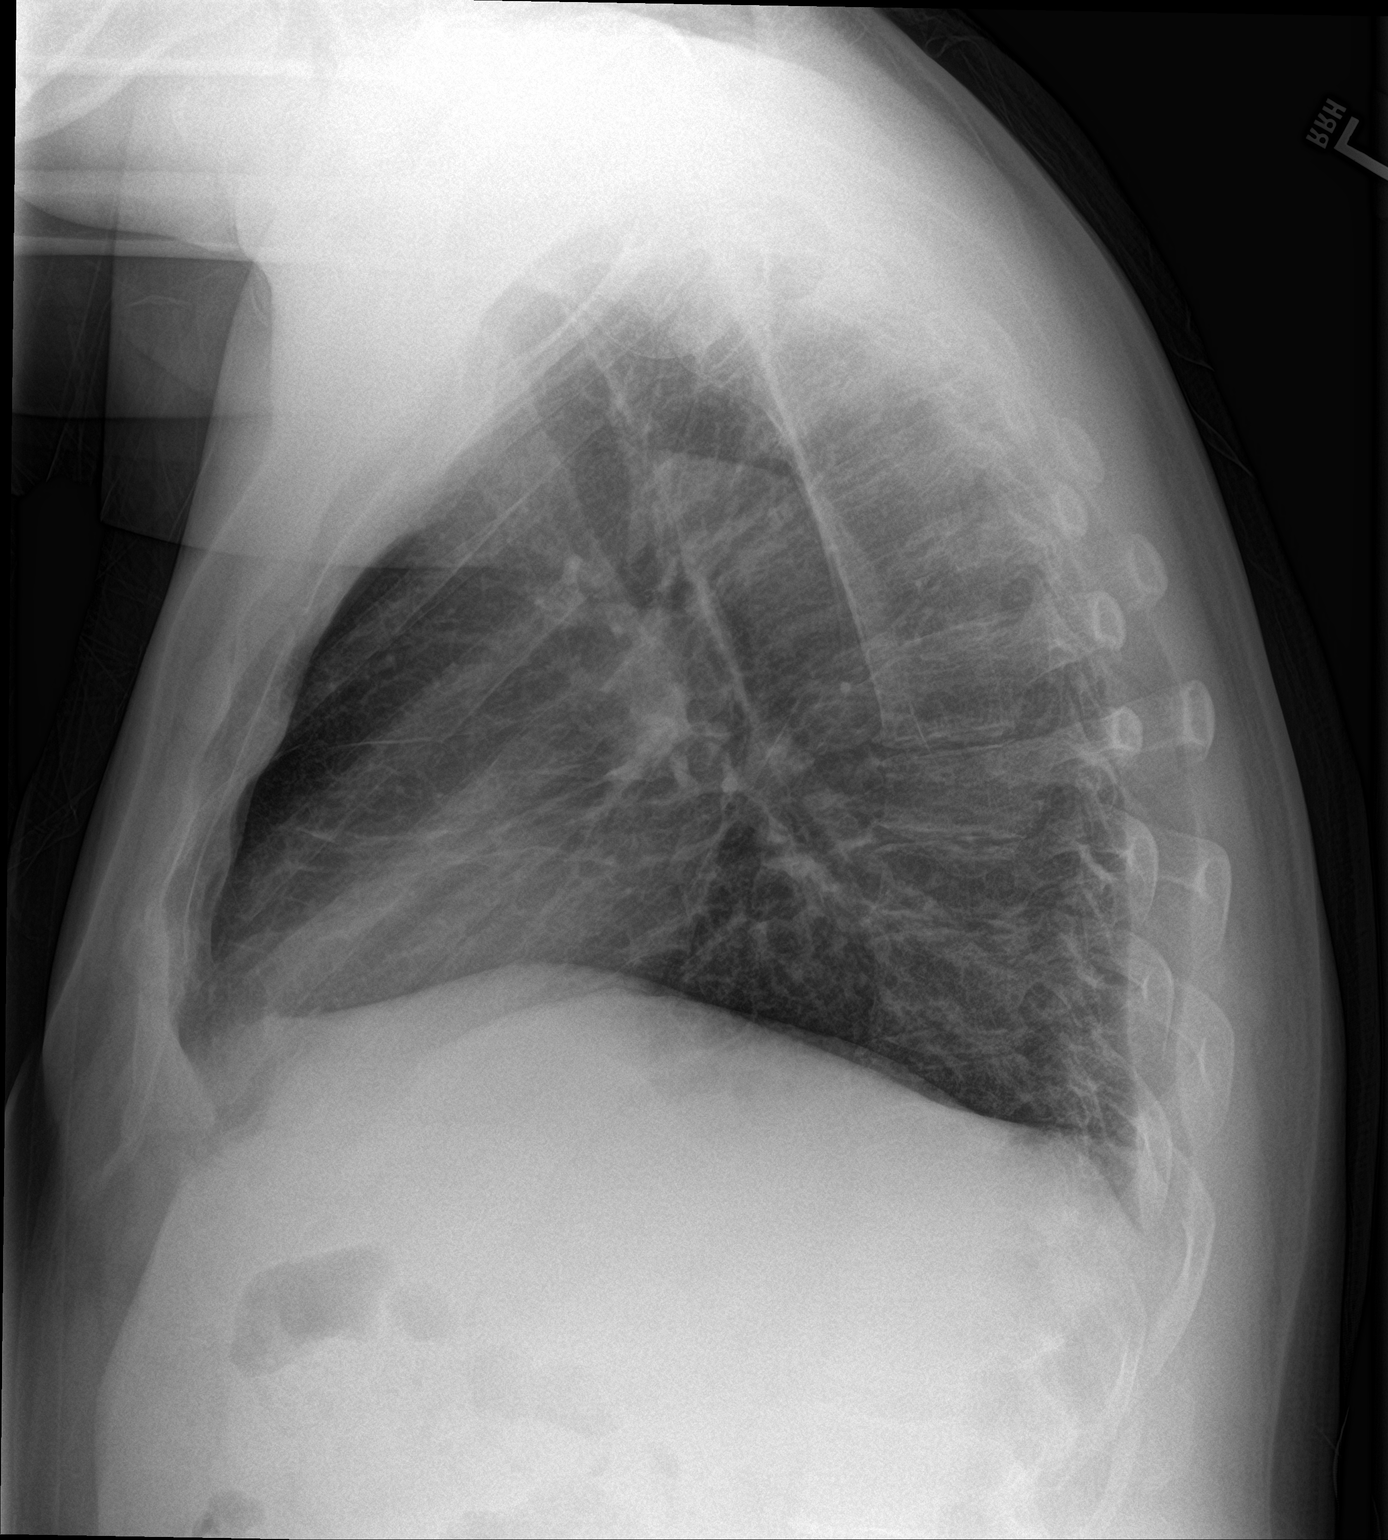

[2 of 2 positions shown; findings below may reference images not displayed]

FINDINGS: Cardiomediastinal silhouette unremarkable, unchanged. Scarring in
the right upper lobe at the site of the prior pulmonary infarct.
Stable linear scarring in the lingula. No new pulmonary parenchymal
abnormalities. No pleural effusions. Mild degenerative changes
involving the thoracic spine. Thoracic dextroscoliosis as noted
previously.
IMPRESSION: 1.  No acute cardiopulmonary disease.
2. Scarring involving the right upper lobe at the site of prior
pulmonary infarct. Stable scarring involving the lingula.

## 2019-03-29 ENCOUNTER — Ambulatory Visit: Payer: Medicare Other | Admitting: Infectious Diseases

## 2019-04-06 ENCOUNTER — Encounter (HOSPITAL_COMMUNITY): Payer: Self-pay

## 2019-04-06 ENCOUNTER — Other Ambulatory Visit (HOSPITAL_COMMUNITY): Payer: Self-pay | Admitting: Family Medicine

## 2019-04-06 ENCOUNTER — Ambulatory Visit (HOSPITAL_COMMUNITY): Admission: RE | Admit: 2019-04-06 | Payer: Medicare Other | Source: Ambulatory Visit

## 2019-04-06 ENCOUNTER — Other Ambulatory Visit: Payer: Self-pay | Admitting: Family Medicine

## 2019-04-06 DIAGNOSIS — R2243 Localized swelling, mass and lump, lower limb, bilateral: Secondary | ICD-10-CM

## 2019-04-07 ENCOUNTER — Other Ambulatory Visit: Payer: Self-pay

## 2019-04-07 ENCOUNTER — Ambulatory Visit (HOSPITAL_COMMUNITY)
Admission: RE | Admit: 2019-04-07 | Discharge: 2019-04-07 | Disposition: A | Discharge status: Home / Self Care | Payer: Medicare Other | Source: Ambulatory Visit | Attending: Family Medicine | Admitting: Family Medicine

## 2019-04-07 ENCOUNTER — Other Ambulatory Visit (HOSPITAL_COMMUNITY)
Admission: RE | Admit: 2019-04-07 | Discharge: 2019-04-07 | Disposition: A | Discharge status: Home / Self Care | Payer: Medicare Other | Source: Ambulatory Visit | Attending: Family Medicine | Admitting: Family Medicine

## 2019-04-07 DIAGNOSIS — R2243 Localized swelling, mass and lump, lower limb, bilateral: Secondary | ICD-10-CM | POA: Insufficient documentation

## 2019-04-07 LAB — D-DIMER, QUANTITATIVE: D-Dimer, Quant: 0.46 ug/mL-FEU (ref 0.00–0.50)

## 2019-04-15 ENCOUNTER — Other Ambulatory Visit: Payer: Self-pay | Admitting: Infectious Diseases

## 2019-04-15 ENCOUNTER — Telehealth: Payer: Self-pay

## 2019-04-15 DIAGNOSIS — B20 Human immunodeficiency virus [HIV] disease: Secondary | ICD-10-CM

## 2019-04-15 NOTE — Telephone Encounter (Signed)
Called patient to schedule an appointment with our office. Patient was able to schedule an appointment with Dr. Johnnye Sima for 6/30 at 3:30. Patient would like to do labs same day due to distance between home address and our office. [place]

## 2019-04-26 ENCOUNTER — Telehealth: Payer: Self-pay | Admitting: Infectious Diseases

## 2019-04-26 MED FILL — GENVOYA TABLET: 150-150-200 | 30 days supply | Qty: 30 | Fill #0

## 2019-04-26 NOTE — Telephone Encounter (Signed)
COVID-19 Pre-Screening Questions: ° °Do you currently have a fever (>100 °F), chills or unexplained body aches? No  ° °Are you currently experiencing new cough, shortness of breath, sore throat, runny nose? No  °•  °Have you recently travelled outside the state of [place] in the last 14 days? No  °•  °1. Have you been in contact with someone that is currently pending confirmation of Covid19 testing or has been confirmed to have the Covid19 virus?  No  ° °

## 2019-04-27 ENCOUNTER — Ambulatory Visit: Payer: Medicare Other | Admitting: Infectious Diseases

## 2019-05-21 ENCOUNTER — Other Ambulatory Visit: Payer: Self-pay | Admitting: Infectious Diseases

## 2019-05-21 DIAGNOSIS — B20 Human immunodeficiency virus [HIV] disease: Secondary | ICD-10-CM

## 2019-05-27 MED FILL — GENVOYA TABLET: 150-150-200 | 30 days supply | Qty: 30 | Fill #0

## 2019-06-21 ENCOUNTER — Other Ambulatory Visit: Payer: Self-pay | Admitting: Infectious Diseases

## 2019-06-21 DIAGNOSIS — B20 Human immunodeficiency virus [HIV] disease: Secondary | ICD-10-CM

## 2019-06-22 NOTE — Telephone Encounter (Signed)
HOLD for appointment on 8/28

## 2019-06-23 ENCOUNTER — Telehealth: Payer: Self-pay

## 2019-06-23 NOTE — Telephone Encounter (Signed)
Received refill request for Genvoya, called patient to reschedule missed appointments, no answer and unable to leave message.  Is due for labs same day due to transportation, and appointment.   Devin [name], [place]

## 2019-06-24 MED FILL — GENVOYA TABLET: 150-150-200 | 30 days supply | Qty: 30 | Fill #0

## 2019-06-25 ENCOUNTER — Ambulatory Visit: Payer: Medicare Other | Admitting: Infectious Diseases

## 2019-07-26 MED FILL — GENVOYA TABLET: 150-150-200 | 30 days supply | Qty: 30 | Fill #1

## 2019-08-27 ENCOUNTER — Telehealth: Payer: Self-pay | Admitting: *Deleted

## 2019-08-27 ENCOUNTER — Other Ambulatory Visit: Payer: Self-pay | Admitting: Infectious Diseases

## 2019-08-27 DIAGNOSIS — B20 Human immunodeficiency virus [HIV] disease: Secondary | ICD-10-CM

## 2019-08-27 NOTE — Telephone Encounter (Signed)
RN received, and denied, refill request for Genvoya. Patient last seen 6.2019, has had multiple outreach attempts to get him into clinic.  RN denied the fill, marked "need for appointment" as reason why. RN called patient, left message letting him know he had to contact his doctor's office, get an appointment, for future refills. Unsure if I can refer to [facility], as patient is in [place]. Landis Gandy, RN

## 2019-08-31 ENCOUNTER — Other Ambulatory Visit: Payer: Self-pay

## 2019-08-31 ENCOUNTER — Ambulatory Visit: Payer: Medicare Other | Admitting: Infectious Diseases

## 2019-08-31 ENCOUNTER — Other Ambulatory Visit (HOSPITAL_COMMUNITY)
Admission: RE | Admit: 2019-08-31 | Discharge: 2019-08-31 | Disposition: A | Discharge status: Home / Self Care | Payer: Medicare Other | Source: Ambulatory Visit | Attending: Infectious Diseases | Admitting: Infectious Diseases

## 2019-08-31 ENCOUNTER — Other Ambulatory Visit: Payer: Medicare Other

## 2019-08-31 DIAGNOSIS — B20 Human immunodeficiency virus [HIV] disease: Secondary | ICD-10-CM | POA: Diagnosis present

## 2019-08-31 DIAGNOSIS — Z113 Encounter for screening for infections with a predominantly sexual mode of transmission: Secondary | ICD-10-CM

## 2019-08-31 DIAGNOSIS — Z79899 Other long term (current) drug therapy: Secondary | ICD-10-CM

## 2019-09-01 LAB — T-HELPER CELL (CD4) - (RCID CLINIC ONLY)
CD4 % Helper T Cell: 36 % (ref 33–65)
CD4 T Cell Abs: 1091 /uL (ref 400–1790)

## 2019-09-02 LAB — URINE CYTOLOGY ANCILLARY ONLY
Chlamydia: NEGATIVE
Comment: NEGATIVE
Comment: NORMAL
Neisseria Gonorrhea: NEGATIVE

## 2019-09-04 LAB — LIPID PANEL
Cholesterol: 231 mg/dL — ABNORMAL HIGH (ref <200)
HDL: 34 mg/dL — ABNORMAL LOW (ref >=40)
Non-HDL Cholesterol (Calc): 197 mg/dL (calc) — ABNORMAL HIGH (ref <130)
Total CHOL/HDL Ratio: 6.8 (calc) — ABNORMAL HIGH (ref <5.0)
Triglycerides: 640 mg/dL — ABNORMAL HIGH (ref <150)

## 2019-09-04 LAB — CBC WITH DIFFERENTIAL/PLATELET
Absolute Monocytes: 525 cells/uL (ref 200–950)
Basophils Absolute: 60 cells/uL (ref 0–200)
Basophils Relative: 0.8 %
Eosinophils Absolute: 173 cells/uL (ref 15–500)
Eosinophils Relative: 2.3 %
HCT: 46.4 % (ref 38.5–50.0)
Hemoglobin: 15.8 g/dL (ref 13.2–17.1)
Lymphs Abs: 3045 cells/uL (ref 850–3900)
MCH: 31.8 pg (ref 27.0–33.0)
MCHC: 34.1 g/dL (ref 32.0–36.0)
MCV: 93.4 fL (ref 80.0–100.0)
MPV: 9.9 fL (ref 7.5–12.5)
Monocytes Relative: 7 %
Neutro Abs: 3698 cells/uL (ref 1500–7800)
Neutrophils Relative %: 49.3 %
Platelets: 201 10*3/uL (ref 140–400)
RBC: 4.97 10*6/uL (ref 4.20–5.80)
RDW: 12.2 % (ref 11.0–15.0)
Total Lymphocyte: 40.6 %
WBC: 7.5 10*3/uL (ref 3.8–10.8)

## 2019-09-04 LAB — HIV-1 RNA QUANT-NO REFLEX-BLD
HIV 1 RNA Quant: <20 copies/mL
HIV-1 RNA Quant, Log: <1.3 Log copies/mL

## 2019-09-04 LAB — COMPLETE METABOLIC PANEL WITH GFR
AG Ratio: 1.6 (calc) (ref 1.0–2.5)
ALT: 15 U/L (ref 9–46)
AST: 17 U/L (ref 10–35)
Albumin: 4.5 g/dL (ref 3.6–5.1)
Alkaline phosphatase (APISO): 63 U/L (ref 35–144)
BUN: 9 mg/dL (ref 7–25)
CO2: 33 mmol/L — ABNORMAL HIGH (ref 20–32)
Calcium: 10.2 mg/dL (ref 8.6–10.3)
Chloride: 103 mmol/L (ref 98–110)
Creat: 1.21 mg/dL (ref 0.70–1.33)
GFR, Est African American: 77 mL/min/{1.73_m2} (ref >=60)
GFR, Est Non African American: 67 mL/min/{1.73_m2} (ref >=60)
Globulin: 2.8 g/dL (calc) (ref 1.9–3.7)
Glucose, Bld: 128 mg/dL — ABNORMAL HIGH (ref 65–99)
Potassium: 4.5 mmol/L (ref 3.5–5.3)
Sodium: 140 mmol/L (ref 135–146)
Total Bilirubin: 0.2 mg/dL (ref 0.2–1.2)
Total Protein: 7.3 g/dL (ref 6.1–8.1)

## 2019-09-04 LAB — RPR: RPR Ser Ql: NONREACTIVE

## 2019-09-06 ENCOUNTER — Other Ambulatory Visit: Payer: Self-pay | Admitting: Infectious Diseases

## 2019-09-06 DIAGNOSIS — B20 Human immunodeficiency virus [HIV] disease: Secondary | ICD-10-CM

## 2019-09-06 MED FILL — GENVOYA TABLET: 150-150-200 | 30 days supply | Qty: 30 | Fill #0

## 2019-09-16 ENCOUNTER — Ambulatory Visit: Payer: Medicare Other | Admitting: Infectious Diseases

## 2019-09-20 ENCOUNTER — Other Ambulatory Visit: Payer: Self-pay | Admitting: Infectious Diseases

## 2019-09-20 DIAGNOSIS — B20 Human immunodeficiency virus [HIV] disease: Secondary | ICD-10-CM

## 2019-10-04 ENCOUNTER — Other Ambulatory Visit: Payer: Self-pay | Admitting: Infectious Diseases

## 2019-10-04 DIAGNOSIS — B20 Human immunodeficiency virus [HIV] disease: Secondary | ICD-10-CM

## 2019-10-06 ENCOUNTER — Telehealth: Payer: Self-pay

## 2019-10-06 NOTE — Telephone Encounter (Signed)
COVID-19 Pre-Screening Questions:  Do you currently have a fever (>100 F), chills or unexplained body aches? NO   Are you currently experiencing new cough, shortness of breath, sore throat, runny nose?NO .  Have you recently travelled outside the state of [place] in the last 14 days? NO .  Have you been in contact with someone that is currently pending confirmation of Covid19 testing or has been confirmed to have the Covid19 virus?  NO  **If the patient answers NO to ALL questions -  advise the patient to please call the clinic before coming to the office should any symptoms develop.     

## 2019-10-07 ENCOUNTER — Other Ambulatory Visit: Payer: Self-pay

## 2019-10-07 ENCOUNTER — Encounter: Payer: Self-pay | Admitting: Infectious Diseases

## 2019-10-07 ENCOUNTER — Ambulatory Visit (INDEPENDENT_AMBULATORY_CARE_PROVIDER_SITE_OTHER): Payer: Medicare Other | Admitting: Infectious Diseases

## 2019-10-07 VITALS — BP 155/93 | HR 91 | Temp 97.8°F | Wt 214.0 lb

## 2019-10-07 DIAGNOSIS — Z79899 Other long term (current) drug therapy: Secondary | ICD-10-CM

## 2019-10-07 DIAGNOSIS — J449 Chronic obstructive pulmonary disease, unspecified: Secondary | ICD-10-CM

## 2019-10-07 DIAGNOSIS — Z720 Tobacco use: Secondary | ICD-10-CM | POA: Diagnosis not present

## 2019-10-07 DIAGNOSIS — B20 Human immunodeficiency virus [HIV] disease: Secondary | ICD-10-CM

## 2019-10-07 DIAGNOSIS — I10 Essential (primary) hypertension: Secondary | ICD-10-CM

## 2019-10-07 DIAGNOSIS — Z113 Encounter for screening for infections with a predominantly sexual mode of transmission: Secondary | ICD-10-CM

## 2019-10-07 DIAGNOSIS — Z1211 Encounter for screening for malignant neoplasm of colon: Secondary | ICD-10-CM

## 2019-10-07 MED ORDER — GENVOYA 150-150-200-10 MG PO TABS: ORAL_TABLET | ORAL | 0 refills | Status: DC

## 2019-10-07 MED ORDER — GENVOYA 150-150-200-10 MG PO TABS: ORAL_TABLET | ORAL | 9 refills | Status: DC

## 2019-10-07 MED FILL — GENVOYA TABLET: 150-150-200 | 30 days supply | Qty: 30 | Fill #0

## 2019-10-07 NOTE — Assessment & Plan Note (Signed)
He denies having had this. Will send.

## 2019-10-07 NOTE — Assessment & Plan Note (Signed)
He states that his BP is up due to pain. States that his BP is normal o/w.  Asked him to keep BP log so we can monitor.

## 2019-10-07 NOTE — Assessment & Plan Note (Signed)
Would consider adding steroid inh.  Will send to pulmonary for eval.

## 2019-10-07 NOTE — Assessment & Plan Note (Signed)
Encouraged him to quit smoking,  Took nicoderm off his list as he is not taking.

## 2019-10-07 NOTE — Progress Notes (Signed)
   Subjective:    Patient ID: Devin [name], male    DOB: June 26, 1961, 58 y.o.   MRN: [id number]  HPI 58yo M with HIV+ dx 1995, and hx of COPD.Maintained on genvoya(prev tookcombivir/efavirenz). Since his last visit he had back surgery- L5-S1 fusion ("artificial disks, bone grafts").  Today feels SOB, wife attributes to mask. He attributes to cigarettes.  Trying to quit. 1/2 ppd. Down from 2ppd. Has used patches, prior but was sneaking smokes. Then developed rash.  Dad died Jun 02, 2023 from AKI, 2 yo. He was living with pt.   Having difficulty sleeping. Not SOB, emotions keep him up. Has prev taken amytrptiline. Gives him hangover. Not sure that his valium helps him sleep.   Takes combivent bid and albuterol prn.   HIV 1 RNA Quant (copies/mL)  Date Value  08/31/2019 <20 NOT DETECTED  06/25/2018 <20 NOT DETECTED  09/10/2017 <20 NOT DETECTED   CD4 T Cell Abs (/uL)  Date Value  08/31/2019 1,091  09/10/2017 900  08/21/2016 1,110    Review of Systems  Constitutional: Negative for activity change, appetite change, fever and unexpected weight change.  Respiratory: Positive for cough and shortness of breath.   Gastrointestinal: Negative for constipation and diarrhea.  Genitourinary: Negative for difficulty urinating.  Psychiatric/Behavioral: Positive for sleep disturbance.       Objective:   Physical Exam Vitals reviewed.  Constitutional:      Appearance: He is obese.  HENT:     Mouth/Throat:     Mouth: Mucous membranes are moist.     Dentition: Abnormal dentition.     Pharynx: No oropharyngeal exudate.  Eyes:     Extraocular Movements: Extraocular movements intact.     Pupils: Pupils are equal, round, and reactive to light.  Cardiovascular:     Rate and Rhythm: Normal rate and regular rhythm.  Pulmonary:     Effort: Pulmonary effort is normal. No respiratory distress.     Breath sounds: Normal breath sounds. No rhonchi.  Abdominal:     General: Bowel sounds are normal.  There is no distension.     Palpations: Abdomen is soft.     Tenderness: There is no abdominal tenderness.  Musculoskeletal:        General: Normal range of motion.     Cervical back: Normal range of motion.     Right lower leg: No edema.     Left lower leg: No edema.  Neurological:     General: No focal deficit present.     Mental Status: He is alert.           Assessment & Plan:

## 2019-10-07 NOTE — Assessment & Plan Note (Addendum)
Has had flu shot.  Will continue genvoya unless need to change for steroid inh Offered/refused condoms.  Offered/refused condoms.  Will have him seen for colon.  Dental referral placed today for [facility]. Information to schedule appointment completed today.   rtc in 9 months.

## 2019-11-02 ENCOUNTER — Other Ambulatory Visit: Payer: Self-pay | Admitting: Infectious Diseases

## 2019-11-02 DIAGNOSIS — B20 Human immunodeficiency virus [HIV] disease: Secondary | ICD-10-CM

## 2019-11-04 MED FILL — GENVOYA TABLET: 150-150-200 | 30 days supply | Qty: 30 | Fill #0

## 2019-11-08 ENCOUNTER — Ambulatory Visit: Payer: Medicare Other | Admitting: Gastroenterology

## 2019-11-18 ENCOUNTER — Institutional Professional Consult (permissible substitution): Payer: Medicare Other | Admitting: Internal Medicine

## 2019-11-19 ENCOUNTER — Ambulatory Visit (HOSPITAL_COMMUNITY): Payer: Medicaid Other | Admitting: Licensed Clinical Social Worker

## 2019-11-23 ENCOUNTER — Institutional Professional Consult (permissible substitution): Payer: Medicare Other | Admitting: Emergency Medicine

## 2019-12-01 MED FILL — GENVOYA TABLET: 150-150-200 | 30 days supply | Qty: 30 | Fill #1

## 2019-12-08 ENCOUNTER — Ambulatory Visit: Payer: Medicare Other | Admitting: Gastroenterology

## 2019-12-08 ENCOUNTER — Telehealth: Payer: Self-pay

## 2019-12-08 NOTE — Telephone Encounter (Signed)
Patient cancelled his appt for today because he wasn't feeling well. Called and spoke to patient and offered him a virtual visit. He declined because he indicated his Internet is so bad it drops calls all the time. He would prefer to call back and reschedule an in office visit when he can.

## 2019-12-14 ENCOUNTER — Institutional Professional Consult (permissible substitution): Payer: Medicare Other | Admitting: Internal Medicine

## 2019-12-31 MED FILL — GENVOYA TABLET: 150-150-200 | 30 days supply | Qty: 30 | Fill #2

## 2020-01-30 ENCOUNTER — Encounter (HOSPITAL_COMMUNITY): Payer: Self-pay

## 2020-01-30 ENCOUNTER — Emergency Department (HOSPITAL_COMMUNITY): Payer: Medicare Other

## 2020-01-30 ENCOUNTER — Emergency Department (HOSPITAL_COMMUNITY)
Admission: EM | Admit: 2020-01-30 | Discharge: 2020-01-30 | Disposition: A | Discharge status: Home / Self Care | Payer: Medicare Other | Attending: Emergency Medicine | Admitting: Emergency Medicine

## 2020-01-30 ENCOUNTER — Other Ambulatory Visit: Payer: Self-pay

## 2020-01-30 DIAGNOSIS — R0789 Other chest pain: Secondary | ICD-10-CM | POA: Diagnosis not present

## 2020-01-30 DIAGNOSIS — R519 Headache, unspecified: Secondary | ICD-10-CM | POA: Insufficient documentation

## 2020-01-30 DIAGNOSIS — F17210 Nicotine dependence, cigarettes, uncomplicated: Secondary | ICD-10-CM | POA: Diagnosis not present

## 2020-01-30 DIAGNOSIS — S161XXA Strain of muscle, fascia and tendon at neck level, initial encounter: Secondary | ICD-10-CM

## 2020-01-30 DIAGNOSIS — F1290 Cannabis use, unspecified, uncomplicated: Secondary | ICD-10-CM | POA: Diagnosis not present

## 2020-01-30 DIAGNOSIS — I10 Essential (primary) hypertension: Secondary | ICD-10-CM | POA: Diagnosis not present

## 2020-01-30 DIAGNOSIS — Y939 Activity, unspecified: Secondary | ICD-10-CM | POA: Insufficient documentation

## 2020-01-30 DIAGNOSIS — J449 Chronic obstructive pulmonary disease, unspecified: Secondary | ICD-10-CM | POA: Insufficient documentation

## 2020-01-30 DIAGNOSIS — Z79899 Other long term (current) drug therapy: Secondary | ICD-10-CM | POA: Diagnosis not present

## 2020-01-30 DIAGNOSIS — Y999 Unspecified external cause status: Secondary | ICD-10-CM | POA: Insufficient documentation

## 2020-01-30 DIAGNOSIS — Y92017 Garden or yard in single-family (private) house as the place of occurrence of the external cause: Secondary | ICD-10-CM | POA: Diagnosis not present

## 2020-01-30 DIAGNOSIS — M542 Cervicalgia: Secondary | ICD-10-CM | POA: Diagnosis present

## 2020-01-30 DIAGNOSIS — Z86711 Personal history of pulmonary embolism: Secondary | ICD-10-CM | POA: Insufficient documentation

## 2020-01-30 DIAGNOSIS — B20 Human immunodeficiency virus [HIV] disease: Secondary | ICD-10-CM | POA: Insufficient documentation

## 2020-01-30 DIAGNOSIS — W19XXXA Unspecified fall, initial encounter: Secondary | ICD-10-CM

## 2020-01-30 LAB — CBC
HCT: 45.1 % (ref 39.0–52.0)
Hemoglobin: 15.7 g/dL (ref 13.0–17.0)
MCH: 33.5 pg (ref 26.0–34.0)
MCHC: 34.8 g/dL (ref 30.0–36.0)
MCV: 96.2 fL (ref 80.0–100.0)
Platelets: 207 10*3/uL (ref 150–400)
RBC: 4.69 MIL/uL (ref 4.22–5.81)
RDW: 11.7 % (ref 11.5–15.5)
WBC: 7.4 10*3/uL (ref 4.0–10.5)
nRBC: 0 % (ref 0.0–0.2)

## 2020-01-30 LAB — BASIC METABOLIC PANEL
Anion gap: 12 (ref 5–15)
BUN: 6 mg/dL (ref 6–20)
CO2: 26 mmol/L (ref 22–32)
Calcium: 9.6 mg/dL (ref 8.9–10.3)
Chloride: 102 mmol/L (ref 98–111)
Creatinine, Ser: 1.25 mg/dL — ABNORMAL HIGH (ref 0.61–1.24)
GFR calc Af Amer: >60 mL/min (ref >60)
GFR calc non Af Amer: >60 mL/min (ref >60)
Glucose, Bld: 135 mg/dL — ABNORMAL HIGH (ref 70–99)
Potassium: 3.6 mmol/L (ref 3.5–5.1)
Sodium: 140 mmol/L (ref 135–145)

## 2020-01-30 LAB — TROPONIN I (HIGH SENSITIVITY)
Troponin I (High Sensitivity): 11 ng/L (ref <18)
Troponin I (High Sensitivity): 8 ng/L (ref <18)

## 2020-01-30 MED ORDER — SODIUM CHLORIDE 0.9% FLUSH: 3.0000 mL | Freq: Once | INTRAVENOUS | Status: DC

## 2020-01-30 MED ORDER — ACETAMINOPHEN 500 MG PO TABS
1000.0000 mg | ORAL_TABLET | Freq: Once | ORAL | Status: DC
  Filled 2020-01-30: qty 2

## 2020-01-30 NOTE — ED Notes (Signed)
Pt taken to xray 
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
## 2020-01-30 NOTE — ED Notes (Signed)
PIV initiated, 18G to LAC. IV flushes with 10 cc NS without s/s of infiltration. Positive blood return noted. Secured with tape and tegaderm

## 2020-01-30 NOTE — ED Notes (Signed)
Pt back from xray without incidence 

## 2020-01-30 NOTE — ED Notes (Signed)
Care endorsed to Katie, RN 
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
## 2020-01-30 NOTE — ED Notes (Signed)
Pt transported to XR.  
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
## 2020-02-02 MED FILL — GENVOYA TABLET: 150-150-200 | 30 days supply | Qty: 30 | Fill #3

## 2020-02-15 ENCOUNTER — Other Ambulatory Visit: Payer: Self-pay | Admitting: Nurse Practitioner

## 2020-02-15 ENCOUNTER — Other Ambulatory Visit (HOSPITAL_COMMUNITY): Payer: Self-pay | Admitting: Nurse Practitioner

## 2020-02-15 DIAGNOSIS — Z86711 Personal history of pulmonary embolism: Secondary | ICD-10-CM

## 2020-02-15 DIAGNOSIS — M79661 Pain in right lower leg: Secondary | ICD-10-CM

## 2020-02-15 DIAGNOSIS — M79662 Pain in left lower leg: Secondary | ICD-10-CM

## 2020-03-02 ENCOUNTER — Encounter (HOSPITAL_COMMUNITY): Payer: Self-pay

## 2020-03-02 ENCOUNTER — Ambulatory Visit (HOSPITAL_COMMUNITY): Payer: Medicare Other

## 2020-03-02 ENCOUNTER — Ambulatory Visit (HOSPITAL_COMMUNITY): Payer: Medicare Other | Attending: Nurse Practitioner

## 2020-03-02 MED FILL — GENVOYA TABLET: 150-150-200 | 30 days supply | Qty: 30 | Fill #4

## 2020-03-30 MED FILL — GENVOYA TABLET: 150-150-200 | 30 days supply | Qty: 30 | Fill #5

## 2020-04-04 ENCOUNTER — Other Ambulatory Visit: Payer: Self-pay | Admitting: Nurse Practitioner

## 2020-04-04 DIAGNOSIS — I2699 Other pulmonary embolism without acute cor pulmonale: Secondary | ICD-10-CM

## 2020-04-19 ENCOUNTER — Ambulatory Visit
Admission: RE | Admit: 2020-04-19 | Discharge: 2020-04-19 | Disposition: A | Discharge status: Home / Self Care | Payer: Medicare Other | Source: Ambulatory Visit | Attending: Nurse Practitioner | Admitting: Nurse Practitioner

## 2020-04-19 DIAGNOSIS — Z86711 Personal history of pulmonary embolism: Secondary | ICD-10-CM

## 2020-04-27 MED FILL — GENVOYA TABLET: 150-150-200 | 30 days supply | Qty: 30 | Fill #6

## 2020-05-25 MED FILL — GENVOYA TABLET: 150-150-200 | 30 days supply | Qty: 30 | Fill #7

## 2020-06-27 ENCOUNTER — Other Ambulatory Visit: Payer: Self-pay | Admitting: Infectious Diseases

## 2020-06-27 DIAGNOSIS — B20 Human immunodeficiency virus [HIV] disease: Secondary | ICD-10-CM

## 2020-07-04 MED FILL — GENVOYA TABLET: 150-150-200 | 30 days supply | Qty: 30 | Fill #0

## 2020-07-06 ENCOUNTER — Ambulatory Visit: Payer: Medicare Other | Admitting: Infectious Diseases

## 2020-07-12 ENCOUNTER — Other Ambulatory Visit: Payer: Self-pay

## 2020-07-12 ENCOUNTER — Encounter: Payer: Self-pay | Admitting: Infectious Diseases

## 2020-07-12 ENCOUNTER — Ambulatory Visit (INDEPENDENT_AMBULATORY_CARE_PROVIDER_SITE_OTHER): Payer: Medicare Other | Admitting: Infectious Diseases

## 2020-07-12 ENCOUNTER — Other Ambulatory Visit (HOSPITAL_COMMUNITY)
Admission: RE | Admit: 2020-07-12 | Discharge: 2020-07-12 | Disposition: A | Discharge status: Home / Self Care | Payer: Medicare Other | Source: Ambulatory Visit | Attending: Infectious Diseases | Admitting: Infectious Diseases

## 2020-07-12 VITALS — BP 129/97 | HR 93 | Temp 98.3°F | Wt 194.0 lb

## 2020-07-12 DIAGNOSIS — F1910 Other psychoactive substance abuse, uncomplicated: Secondary | ICD-10-CM

## 2020-07-12 DIAGNOSIS — G8929 Other chronic pain: Secondary | ICD-10-CM

## 2020-07-12 DIAGNOSIS — B20 Human immunodeficiency virus [HIV] disease: Secondary | ICD-10-CM | POA: Diagnosis not present

## 2020-07-12 DIAGNOSIS — Z23 Encounter for immunization: Secondary | ICD-10-CM

## 2020-07-12 DIAGNOSIS — Z113 Encounter for screening for infections with a predominantly sexual mode of transmission: Secondary | ICD-10-CM | POA: Insufficient documentation

## 2020-07-12 DIAGNOSIS — M545 Low back pain, unspecified: Secondary | ICD-10-CM

## 2020-07-12 DIAGNOSIS — Z720 Tobacco use: Secondary | ICD-10-CM

## 2020-07-12 DIAGNOSIS — Z79899 Other long term (current) drug therapy: Secondary | ICD-10-CM
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
## 2020-07-12 NOTE — Assessment & Plan Note (Signed)
Encouraged to quit. 

## 2020-07-12 NOTE — Assessment & Plan Note (Addendum)
Need to check his labs COVD vax been given Needs PCV 23 and flu today.  His girlfriend does not want to use condoms. He is given condoms.  Dental referral placed today for [facility]. Information to schedule appointment completed today. rtc in 9 months.

## 2020-07-12 NOTE — Assessment & Plan Note (Signed)
He'll need to f/u with his PCP, surgeon.

## 2020-07-12 NOTE — Addendum Note (Signed)
Addended by: Teshaun Olarte C on: 07/12/2020 12:32 PM   Modules accepted: Orders

## 2020-07-12 NOTE — Assessment & Plan Note (Signed)
Not clear if he is active- he has been using to manage his back pain.

## 2020-07-13 LAB — URINE CYTOLOGY ANCILLARY ONLY
Chlamydia: NEGATIVE
Comment: NEGATIVE
Comment: NORMAL
Neisseria Gonorrhea: NEGATIVE

## 2020-07-13 LAB — T-HELPER CELL (CD4) - (RCID CLINIC ONLY)
CD4 % Helper T Cell: 38 % (ref 33–65)
CD4 T Cell Abs: 708 /uL (ref 400–1790)

## 2020-07-15 LAB — LIPID PANEL
Cholesterol: 189 mg/dL (ref <200)
HDL: 38 mg/dL — ABNORMAL LOW (ref >=40)
LDL Cholesterol (Calc): 126 mg/dL (calc) — ABNORMAL HIGH
Non-HDL Cholesterol (Calc): 151 mg/dL (calc) — ABNORMAL HIGH (ref <130)
Total CHOL/HDL Ratio: 5 (calc) — ABNORMAL HIGH (ref <5.0)
Triglycerides: 131 mg/dL (ref <150)

## 2020-07-15 LAB — COMPREHENSIVE METABOLIC PANEL
AG Ratio: 1.6 (calc) (ref 1.0–2.5)
ALT: 12 U/L (ref 9–46)
AST: 18 U/L (ref 10–35)
Albumin: 4.4 g/dL (ref 3.6–5.1)
Alkaline phosphatase (APISO): 72 U/L (ref 35–144)
BUN: 16 mg/dL (ref 7–25)
CO2: 26 mmol/L (ref 20–32)
Calcium: 9.4 mg/dL (ref 8.6–10.3)
Chloride: 108 mmol/L (ref 98–110)
Creat: 1.03 mg/dL (ref 0.70–1.33)
Globulin: 2.8 g/dL (calc) (ref 1.9–3.7)
Glucose, Bld: 105 mg/dL — ABNORMAL HIGH (ref 65–99)
Potassium: 4.2 mmol/L (ref 3.5–5.3)
Sodium: 140 mmol/L (ref 135–146)
Total Bilirubin: 0.3 mg/dL (ref 0.2–1.2)
Total Protein: 7.2 g/dL (ref 6.1–8.1)

## 2020-07-15 LAB — CBC
HCT: 45.7 % (ref 38.5–50.0)
Hemoglobin: 15.7 g/dL (ref 13.2–17.1)
MCH: 32.6 pg (ref 27.0–33.0)
MCHC: 34.4 g/dL (ref 32.0–36.0)
MCV: 95 fL (ref 80.0–100.0)
MPV: 10 fL (ref 7.5–12.5)
Platelets: 182 10*3/uL (ref 140–400)
RBC: 4.81 10*6/uL (ref 4.20–5.80)
RDW: 11.8 % (ref 11.0–15.0)
WBC: 6.5 10*3/uL (ref 3.8–10.8)

## 2020-07-15 LAB — PSA: PSA: 0.33 ng/mL (ref <=4.0)

## 2020-07-15 LAB — RPR: RPR Ser Ql: NONREACTIVE

## 2020-07-15 LAB — HIV-1 RNA QUANT-NO REFLEX-BLD
HIV 1 RNA Quant: <20 Copies/mL
HIV-1 RNA Quant, Log: <1.3 Log cps/mL

## 2020-07-26 ENCOUNTER — Other Ambulatory Visit: Payer: Self-pay | Admitting: Infectious Diseases

## 2020-07-26 ENCOUNTER — Other Ambulatory Visit (HOSPITAL_COMMUNITY): Payer: Self-pay | Admitting: Infectious Diseases

## 2020-07-26 ENCOUNTER — Telehealth: Payer: Self-pay

## 2020-07-26 DIAGNOSIS — B20 Human immunodeficiency virus [HIV] disease: Secondary | ICD-10-CM

## 2020-07-26 NOTE — Telephone Encounter (Signed)
Patient called requesting a records release form for records to be released from our office to be mailed to him. The form has been mailed to the patient. Patient also requested a dental form. Spoke with Claris Che with the dental clinic and the patient still has a dental application on file with them. Claris Che states she has been trying to contact patient since last year and will try reaching out to him today. I have verified that she has the correct number for the patient.  Patient advised the dental clinic will contact him. Luiza Carranco T Pricilla Loveless

## 2020-07-31 MED FILL — GENVOYA TABLET: 150-150-200 | 30 days supply | Qty: 30 | Fill #0

## 2020-09-12 MED FILL — GENVOYA TABLET: 150-150-200 | 30 days supply | Qty: 30 | Fill #1

## 2020-10-18 MED FILL — GENVOYA TABLET: 150-150-200 | 30 days supply | Qty: 30 | Fill #2

## 2020-11-21 ENCOUNTER — Other Ambulatory Visit: Payer: Medicare Other

## 2020-11-21 DIAGNOSIS — Z20822 Contact with and (suspected) exposure to covid-19: Secondary | ICD-10-CM

## 2020-11-22 ENCOUNTER — Telehealth: Payer: Self-pay | Admitting: Family Medicine

## 2020-11-22 LAB — NOVEL CORONAVIRUS, NAA: SARS-CoV-2, NAA: NOT DETECTED

## 2020-11-22 LAB — SARS-COV-2, NAA 2 DAY TAT

## 2020-11-22 MED FILL — GENVOYA TABLET: 150-150-200 | 30 days supply | Qty: 30 | Fill #3

## 2020-11-22 NOTE — Telephone Encounter (Signed)
Negative COVID results given. Patient results "NOT Detected." Caller expressed understanding. ° °

## 2020-12-25 MED FILL — GENVOYA TABLET: 150-150-200 | 30 days supply | Qty: 30 | Fill #4

## 2021-01-24 ENCOUNTER — Ambulatory Visit (HOSPITAL_COMMUNITY): Payer: Medicare Other

## 2021-01-25 ENCOUNTER — Encounter (HOSPITAL_COMMUNITY): Payer: Self-pay

## 2021-01-25 ENCOUNTER — Ambulatory Visit (HOSPITAL_COMMUNITY): Payer: Medicare Other

## 2021-01-25 MED FILL — GENVOYA TABLET: 150-150-200 | 30 days supply | Qty: 30 | Fill #5

## 2021-01-26 ENCOUNTER — Other Ambulatory Visit (HOSPITAL_COMMUNITY): Payer: Self-pay

## 2021-01-28 ENCOUNTER — Emergency Department (HOSPITAL_COMMUNITY): Payer: Medicare Other

## 2021-01-28 ENCOUNTER — Emergency Department (HOSPITAL_COMMUNITY)
Admission: EM | Admit: 2021-01-28 | Discharge: 2021-01-29 | Disposition: A | Discharge status: Home / Self Care | Payer: Medicare Other | Attending: Emergency Medicine | Admitting: Emergency Medicine

## 2021-01-28 ENCOUNTER — Other Ambulatory Visit: Payer: Self-pay

## 2021-01-28 DIAGNOSIS — J45909 Unspecified asthma, uncomplicated: Secondary | ICD-10-CM | POA: Diagnosis not present

## 2021-01-28 DIAGNOSIS — J441 Chronic obstructive pulmonary disease with (acute) exacerbation: Secondary | ICD-10-CM | POA: Diagnosis not present

## 2021-01-28 DIAGNOSIS — R091 Pleurisy: Secondary | ICD-10-CM

## 2021-01-28 DIAGNOSIS — Z21 Asymptomatic human immunodeficiency virus [HIV] infection status: Secondary | ICD-10-CM | POA: Diagnosis not present

## 2021-01-28 DIAGNOSIS — F17210 Nicotine dependence, cigarettes, uncomplicated: Secondary | ICD-10-CM | POA: Insufficient documentation

## 2021-01-28 DIAGNOSIS — R0789 Other chest pain: Secondary | ICD-10-CM | POA: Diagnosis present

## 2021-01-28 DIAGNOSIS — Z79899 Other long term (current) drug therapy: Secondary | ICD-10-CM | POA: Insufficient documentation

## 2021-01-28 NOTE — ED Provider Notes (Incomplete)
MSE was initiated and I personally evaluated the patient and placed orders (if any) at  11:35 PM on January 28, 2021.  Patient placed in Quick Look pathway, seen and evaluated   Chief Complaint: Chest pain, toe pain  HPI:   Patient is a 60 year old male past medical history significant for EtOH abuse, HIV, depression, reflux, chest pain, back pain, pulmonary emboli    ROS: *** (one)  Physical Exam:   Gen: No distress  Neuro: Awake and Alert  Skin: Warm    Focused Exam: ***    Given patient's history it is worth evaluating patient for pulmonary embolism.  Given the patient's his blood pressure is mildly hypertensive but without hypotension and he is not tachycardic   Initiation of care has begun. The patient has been counseled on the process, plan, and necessity for staying for the completion/evaluation, and the remainder of the medical screening examination  The patient appears stable so that the remainder of the MSE may be completed by another provider.

## 2021-01-28 NOTE — ED Provider Notes (Signed)
MSE was initiated and I personally evaluated the patient and placed orders (if any) at  11:35 PM on January 28, 2021.  Patient placed in Quick Look pathway, seen and evaluated   Chief Complaint: Chest pain, toe pain  HPI:   Patient is a 60 year old male past medical history significant for EtOH abuse, HIV, depression, reflux, chest pain, back pain, pulmonary emboli    ROS: chest pain (one)  Physical Exam:   Gen: No distress  Neuro: Awake and Alert  Skin: Warm    Focused Exam: gen well appearing, no acute distress    Given patient's history it is worth evaluating patient for pulmonary embolism.  Given the patient's his blood pressure is mildly hypertensive but without hypotension and he is not tachycardic  Pt is being moved immediately back to [facility]  Initiation of care has begun. The patient has been counseled on the process, plan, and necessity for staying for the completion/evaluation, and the remainder of the medical screening examination  The patient appears stable so that the remainder of the MSE may be completed by another provider.   Solon Augusta [place], [place] 01/31/21 1508    Gilda Crease, MD 02/07/21 [phone]

## 2021-01-28 NOTE — ED Triage Notes (Addendum)
Pt said he has been hurting in both lungs per pt. He said he is suppose to be having test to see if he has blood clots Wednesday this week. Pt said he hurts in his left groin area also. No obvious swelling in his legs. Pt said he has SOB bc of pain in his lungs. Pt said has been going on for quite some time. Pt said like a flutter feeling in his right lung. Marland Kitchen

## 2021-01-29 ENCOUNTER — Emergency Department (HOSPITAL_COMMUNITY): Payer: Medicare Other

## 2021-01-29 DIAGNOSIS — J441 Chronic obstructive pulmonary disease with (acute) exacerbation: Secondary | ICD-10-CM | POA: Diagnosis not present

## 2021-01-29 LAB — CBC WITH DIFFERENTIAL/PLATELET
Abs Immature Granulocytes: 0.02 10*3/uL (ref 0.00–0.07)
Basophils Absolute: 0 10*3/uL (ref 0.0–0.1)
Basophils Relative: 1 %
Eosinophils Absolute: 0.2 10*3/uL (ref 0.0–0.5)
Eosinophils Relative: 2 %
HCT: 45 % (ref 39.0–52.0)
Hemoglobin: 15.7 g/dL (ref 13.0–17.0)
Immature Granulocytes: 0 %
Lymphocytes Relative: 44 %
Lymphs Abs: 3.1 10*3/uL (ref 0.7–4.0)
MCH: 33.5 pg (ref 26.0–34.0)
MCHC: 34.9 g/dL (ref 30.0–36.0)
MCV: 95.9 fL (ref 80.0–100.0)
Monocytes Absolute: 0.5 10*3/uL (ref 0.1–1.0)
Monocytes Relative: 7 %
Neutro Abs: 3.3 10*3/uL (ref 1.7–7.7)
Neutrophils Relative %: 46 %
Platelets: 199 10*3/uL (ref 150–400)
RBC: 4.69 MIL/uL (ref 4.22–5.81)
RDW: 12.3 % (ref 11.5–15.5)
WBC: 7.1 10*3/uL (ref 4.0–10.5)
nRBC: 0 % (ref 0.0–0.2)

## 2021-01-29 LAB — BASIC METABOLIC PANEL
Anion gap: 7 (ref 5–15)
BUN: 8 mg/dL (ref 6–20)
CO2: 29 mmol/L (ref 22–32)
Calcium: 9.2 mg/dL (ref 8.9–10.3)
Chloride: 101 mmol/L (ref 98–111)
Creatinine, Ser: 1.29 mg/dL — ABNORMAL HIGH (ref 0.61–1.24)
GFR, Estimated: >60 mL/min (ref >60)
Glucose, Bld: 141 mg/dL — ABNORMAL HIGH (ref 70–99)
Potassium: 3.8 mmol/L (ref 3.5–5.1)
Sodium: 137 mmol/L (ref 135–145)

## 2021-01-29 LAB — ETHANOL: Alcohol, Ethyl (B): 12 mg/dL — ABNORMAL HIGH (ref <10)

## 2021-01-29 LAB — TROPONIN I (HIGH SENSITIVITY): Troponin I (High Sensitivity): 7 ng/L (ref <18)

## 2021-01-29 MED ORDER — IOHEXOL 350 MG/ML SOLN
75.0000 mL | Freq: Once | INTRAVENOUS | Status: AC | PRN
  Administered 2021-01-29: 75 mL via INTRAVENOUS

## 2021-01-29 MED ORDER — AMOXICILLIN 500 MG PO CAPS: 1000.0000 mg | ORAL_CAPSULE | Freq: Two times a day (BID) | ORAL | 0 refills | Status: DC

## 2021-01-29 MED ORDER — PREDNISONE 20 MG PO TABS: 40.0000 mg | ORAL_TABLET | Freq: Every day | ORAL | 0 refills | Status: DC

## 2021-01-29 NOTE — ED Provider Notes (Signed)
[facility]   CSN: [id number] Arrival date & time: 01/28/21  2303     History No chief complaint on file.   Devin [name] is a 60 y.o. male.  Patient presents to the emergency department for evaluation of bilateral chest and "lung" pain.  Patient reports that he has been noticing shortness of breath and pain with breathing.  He tells me that he is supposed to be having a test for "blood clots" in 3 days.  He does have a history of pulmonary embolism.  Patient also with history of chronic pain syndrome as well as COPD.        Past Medical History:  Diagnosis Date  . Anxiety   . Arthritis    "I'm eat up w/it" (02/20/2017)  . Asthma   . Chronic back pain    "the whole back" (02/20/2017)  . Chronic bronchitis (HCC)   . Complication of anesthesia    "felt like I couldn't breath coming out of it"  . COPD (chronic obstructive pulmonary disease) (HCC)   . Depression   . GERD (gastroesophageal reflux disease)   . History of hiatal hernia   . History of stomach ulcers    "bleeding ones; I was young then"  . HIV infection (HCC) dx'd ~ 1999  . Hypertension   . Pneumonia    "several times" (02/20/2017)  . Prolapsed disk 10/28  . Pulmonary embolism (HCC) 02/20/2017  . Scoliosis 08/24/13    Patient Active Problem List   Diagnosis Date Noted  . Hepatitis B immune 09/10/2017  . Pulmonary emboli (HCC) 02/20/2017  . Pulmonary infarct (HCC) 02/20/2017  . Adolescent idiopathic scoliosis of thoracolumbar region 09/14/2015  . Dysphagia 01/11/2014  . Encounter for screening colonoscopy 01/11/2014  . ETOH abuse 12/01/2013  . Encounter for long-term (current) use of other medications 04/26/2013  . Essential hypertension, benign 04/12/2013  . CHEST PAIN 06/12/2010  . ABDOMINAL PAIN 06/12/2010  . ACUTE SINUSITIS, UNSPECIFIED 11/24/2009  . ALLERGIC RHINITIS 02/14/2009  . Tobacco abuse 11/15/2008  . ACUTE BRONCHITIS 03/04/2008  .  DEPRESSION 12/03/2006  . LOW BACK PAIN 12/03/2006  . Human immunodeficiency virus (HIV) disease (HCC) 11/13/2006  . Substance abuse (HCC) 11/13/2006  . Chronic obstructive pulmonary disease (HCC) 11/13/2006  . GERD 11/13/2006    Past Surgical History:  Procedure Laterality Date  . KNEE ARTHROSCOPY Right 1980s       Family History  Problem Relation Age of Onset  . Diabetes Father   . Stroke Other   . Colon cancer Neg Hx     Social History   Tobacco Use  . Smoking status: Current Every Day Smoker    Packs/day: 1.00    Years: 48.00    Pack years: 48.00    Types: Cigarettes  . Smokeless tobacco: Never Used  Vaping Use  . Vaping Use: Never used  Substance Use Topics  . Alcohol use: Yes    Comment: 02/20/2017 "nothing in  1 1/2 to 2 years"  . Drug use: Yes    Frequency: 1.0 times per week    Types: Marijuana    Comment: 02/20/2017 "weekly"    Home Medications Prior to Admission medications   Medication Sig Start Date End Date Taking? Authorizing Provider  albuterol (PROVENTIL HFA;VENTOLIN HFA) 108 (90 BASE) MCG/ACT inhaler Inhale 2 puffs into the lungs every 4 (four) hours as needed for wheezing or shortness of breath.    [provider]  albuterol (PROVENTIL) (2.5  MG/3ML) 0.083% nebulizer solution Take 2.5 mg by nebulization every 6 (six) hours as needed for wheezing or shortness of breath.    [provider]  amitriptyline (ELAVIL) 50 MG tablet Take 50 mg by mouth at bedtime as needed for sleep.  09/12/13   [provider]  calcium carbonate (TUMS EX) 750 MG chewable tablet Chew 2 tablets by mouth as needed for heartburn. Patient not taking: Reported on 07/12/2020    [provider]  cholecalciferol (VITAMIN D3) 25 MCG (1000 UNIT) tablet Take 1,000 Units by mouth daily. Patient not taking: Reported on 07/12/2020    [provider]  COMBIVENT RESPIMAT 20-100 MCG/ACT AERS respimat Inhale 2 puffs into the lungs every 4 (four) hours  as needed. wheezing 12/14/13   [provider]  cyclobenzaprine (FLEXERIL) 10 MG tablet Take 10 mg by mouth 3 (three) times daily as needed for muscle spasms.    [provider]  diazepam (VALIUM) 10 MG tablet Take 10 mg by mouth 3 (three) times daily as needed for anxiety.     [provider]  docusate sodium (COLACE) 100 MG capsule Take 100 mg by mouth daily. Patient not taking: Reported on 07/12/2020    [provider]  GENVOYA 150-150-200-10 MG TABS tablet TAKE 1 TABLET BY MOUTH ONCE DAILY WITH BREAKFAST. 07/26/20   Ginnie Smart, MD  Multiple Vitamins-Minerals (MULTI FOR HIM 50+) TABS Take 1 tablet by mouth daily.    [provider]  oxyCODONE (OXYCONTIN) 15 mg 12 hr tablet Take 1 tablet (15 mg total) by mouth every 12 (twelve) hours. 02/22/17   Richarda Overlie, MD  Oxycodone HCl 10 MG TABS Take 10-20 mg by mouth every 6 (six) hours as needed for pain. 01/03/20   [provider]  pregabalin (LYRICA) 300 MG capsule Take 300 mg by mouth every 12 (twelve) hours. 06/16/20   [provider]  pantoprazole (PROTONIX) 40 MG tablet Take 1 tablet (40 mg total) by mouth daily. Patient not taking: Reported on 10/07/2019 02/22/17 01/30/20  Richarda Overlie, MD    Allergies    Patient has no known allergies.  Review of Systems   Review of Systems  Respiratory: Positive for chest tightness and shortness of breath.   Cardiovascular: Positive for chest pain.  All other systems reviewed and are negative.   Physical Exam Updated Vital Signs BP 110/78   Pulse 84   Temp 97.7 F (36.5 C) (Oral)   Resp 16   SpO2 92%   Physical Exam Vitals and nursing note reviewed.  Constitutional:      General: He is not in acute distress.    Appearance: Normal appearance. He is well-developed.  HENT:     Head: Normocephalic and atraumatic.     Right Ear: Hearing normal.     Left Ear: Hearing normal.     Nose: Nose normal.  Eyes:     Conjunctiva/sclera:  Conjunctivae normal.     Pupils: Pupils are equal, round, and reactive to light.  Cardiovascular:     Rate and Rhythm: Regular rhythm.     Heart sounds: S1 normal and S2 normal. No murmur heard. No friction rub. No gallop.   Pulmonary:     Effort: Pulmonary effort is normal. No respiratory distress.     Breath sounds: Normal breath sounds.  Chest:     Chest wall: No tenderness.  Abdominal:     General: Bowel sounds are normal.     Palpations: Abdomen is soft.  Tenderness: There is no abdominal tenderness. There is no guarding or rebound. Negative signs include Murphy's sign and McBurney's sign.     Hernia: No hernia is present.  Musculoskeletal:        General: Normal range of motion.     Cervical back: Normal range of motion and neck supple.  Skin:    General: Skin is warm and dry.     Findings: No rash.  Neurological:     Mental Status: He is alert.     GCS: GCS eye subscore is 4. GCS verbal subscore is 5. GCS motor subscore is 6.     Cranial Nerves: No cranial nerve deficit.     Sensory: No sensory deficit.     Coordination: Coordination normal.     Comments: Somnolent  Psychiatric:        Speech: Speech normal.        Behavior: Behavior normal.        Thought Content: Thought content normal.     ED Results / Procedures / Treatments   Labs (all labs ordered are listed, but only abnormal results are displayed) Labs Reviewed  BASIC METABOLIC PANEL - Abnormal; Notable for the following components:      Result Value   Glucose, Bld 141 (*)    Creatinine, Ser 1.29 (*)    All other components within normal limits  ETHANOL - Abnormal; Notable for the following components:   Alcohol, Ethyl (B) 12 (*)    All other components within normal limits  CBC WITH DIFFERENTIAL/PLATELET  TROPONIN I (HIGH SENSITIVITY)  TROPONIN I (HIGH SENSITIVITY)    EKG EKG Interpretation  Date/Time:  Sunday January 28 2021 23:13:15 EDT Ventricular Rate:  90 PR Interval:  162 QRS  Duration: 98 QT Interval:  370 QTC Calculation: 452 R Axis:   4 Text Interpretation: Normal sinus rhythm Normal ECG Confirmed by Gilda Crease [phone]) on 01/29/2021 1:26:52 AM   Radiology DG Chest 2 View  Result Date: 01/29/2021 CLINICAL DATA:  Right-sided chest pain. EXAM: CHEST - 2 VIEW COMPARISON:  January 30, 2020 FINDINGS: Chronic appearing increased interstitial lung markings are seen. Mild areas of atelectasis are noted within the bilateral lung bases. There is no evidence of a pleural effusion or pneumothorax. The heart size and mediastinal contours are within normal limits. Moderate severity dextroscoliosis of thoracic spine is present. IMPRESSION: Chronic appearing increased interstitial lung markings with mild bibasilar atelectasis. Electronically Signed   By: Aram Candela M.D.   On: 01/29/2021 00:07   CT ANGIO CHEST PE W OR WO CONTRAST  Result Date: 01/29/2021 CLINICAL DATA:  Chest pain EXAM: CT ANGIOGRAPHY CHEST WITH CONTRAST TECHNIQUE: Multidetector CT imaging of the chest was performed using the standard protocol during bolus administration of intravenous contrast. Multiplanar CT image reconstructions and MIPs were obtained to evaluate the vascular anatomy. CONTRAST:  69mL OMNIPAQUE IOHEXOL 350 MG/ML SOLN COMPARISON:  08/21/2017 FINDINGS: Cardiovascular: No filling defects in the pulmonary arteries to suggest pulmonary emboli. Heart is normal size. Aorta is normal caliber. Mediastinum/Nodes: No mediastinal, hilar, or axillary adenopathy. Trachea and esophagus are unremarkable. Thyroid unremarkable. Lungs/Pleura: Right upper lobe scarring. Lingular scarring at the left base. No effusions or acute confluent opacities. Upper Abdomen: Imaging into the upper abdomen demonstrates no acute findings. Musculoskeletal: Chest wall soft tissues are unremarkable. No acute bony abnormality. Review of the MIP images confirms the above findings. IMPRESSION: No evidence of pulmonary embolus. Areas  of scarring in the lungs bilaterally. No acute cardiopulmonary disease.  Electronically Signed   By: Charlett Nose M.D.   On: 01/29/2021 01:54    Procedures Procedures   Medications Ordered in ED Medications  iohexol (OMNIPAQUE) 350 MG/ML injection 75 mL (75 mLs Intravenous Contrast Given 01/29/21 0150)    ED Course  I have reviewed the triage vital signs and the nursing notes.  Pertinent labs & imaging results that were available during my care of the patient were reviewed by me and considered in my medical decision making (see chart for details).    MDM Rules/Calculators/A&P                          Patient with complaints of bilateral lung pain for "sometime".  He reports a history of pulmonary embolism.  Patient was somnolent and difficult to arouse upon my initial evaluation.  He does not appear to be in any distress.  Chest x-ray without acute abnormality.  No evidence of congestive heart failure.  EKG normal.  Troponin negative.  CT angiography has been performed and does not show any acute pathology including no evidence of PE.  Will treat for pleurisy related to COPD   Final Clinical Impression(s) / ED Diagnoses Final diagnoses:  Chronic obstructive pulmonary disease with acute exacerbation (HCC)  Pleurisy    Rx / DC Orders ED Discharge Orders    None       Fatma Rutten, Canary Brim, MD 01/29/21 0222

## 2021-01-29 NOTE — ED Notes (Signed)
Patient to CT.

## 2021-01-29 NOTE — ED Notes (Signed)
Patient back from CT.

## 2021-02-01 ENCOUNTER — Ambulatory Visit (HOSPITAL_COMMUNITY): Payer: Medicare Other

## 2021-02-15 ENCOUNTER — Ambulatory Visit (HOSPITAL_COMMUNITY): Admission: RE | Admit: 2021-02-15 | Payer: Medicare Other | Source: Ambulatory Visit

## 2021-02-23 ENCOUNTER — Other Ambulatory Visit (HOSPITAL_COMMUNITY): Payer: Self-pay

## 2021-02-27 ENCOUNTER — Other Ambulatory Visit: Payer: Self-pay | Admitting: Infectious Diseases

## 2021-02-27 ENCOUNTER — Other Ambulatory Visit (HOSPITAL_COMMUNITY): Payer: Self-pay

## 2021-02-27 MED ORDER — GENVOYA 150-150-200-10 MG PO TABS
1.0000 | ORAL_TABLET | Freq: Every day | ORAL | 2 refills | Status: AC
  Filled 2021-02-27: qty 30, 30d supply, fill #0
  Filled 2021-04-09: qty 30, 30d supply, fill #1
  Filled 2022-01-02: qty 30, 30d supply, fill #2

## 2021-02-28 ENCOUNTER — Other Ambulatory Visit (HOSPITAL_COMMUNITY): Payer: Self-pay

## 2021-03-01 ENCOUNTER — Other Ambulatory Visit (HOSPITAL_COMMUNITY): Payer: Self-pay

## 2021-03-28 ENCOUNTER — Other Ambulatory Visit (HOSPITAL_COMMUNITY): Payer: Self-pay

## 2021-03-29 ENCOUNTER — Other Ambulatory Visit (HOSPITAL_COMMUNITY): Payer: Self-pay

## 2021-03-30 ENCOUNTER — Other Ambulatory Visit (HOSPITAL_COMMUNITY)
Admission: RE | Admit: 2021-03-30 | Discharge: 2021-03-30 | Disposition: A | Discharge status: Home / Self Care | Payer: Medicare Other | Source: Other Acute Inpatient Hospital | Attending: Family Medicine | Admitting: Family Medicine

## 2021-03-30 DIAGNOSIS — Z79891 Long term (current) use of opiate analgesic: Secondary | ICD-10-CM | POA: Insufficient documentation

## 2021-03-30 DIAGNOSIS — Z79899 Other long term (current) drug therapy: Secondary | ICD-10-CM | POA: Diagnosis not present

## 2021-03-30 DIAGNOSIS — F1190 Opioid use, unspecified, uncomplicated: Secondary | ICD-10-CM | POA: Diagnosis present

## 2021-03-30 LAB — RAPID URINE DRUG SCREEN, HOSP PERFORMED
Amphetamines: NOT DETECTED
Barbiturates: NOT DETECTED
Benzodiazepines: POSITIVE — AB
Cocaine: NOT DETECTED
Opiates: POSITIVE — AB
Tetrahydrocannabinol: NOT DETECTED

## 2021-04-05 ENCOUNTER — Ambulatory Visit: Payer: Medicare Other | Admitting: Neurology

## 2021-04-09 ENCOUNTER — Other Ambulatory Visit (HOSPITAL_COMMUNITY): Payer: Self-pay

## 2021-04-12 ENCOUNTER — Ambulatory Visit: Payer: Medicare Other | Admitting: Neurology

## 2021-04-13 ENCOUNTER — Ambulatory Visit: Payer: Medicare Other | Admitting: Infectious Diseases

## 2021-04-27 ENCOUNTER — Other Ambulatory Visit (HOSPITAL_COMMUNITY): Payer: Self-pay

## 2021-05-01 ENCOUNTER — Other Ambulatory Visit (HOSPITAL_COMMUNITY): Payer: Self-pay | Admitting: Physician Assistant

## 2021-05-01 DIAGNOSIS — M545 Low back pain, unspecified: Secondary | ICD-10-CM

## 2021-05-03 ENCOUNTER — Other Ambulatory Visit (HOSPITAL_COMMUNITY): Payer: Self-pay

## 2021-05-04 ENCOUNTER — Other Ambulatory Visit (HOSPITAL_COMMUNITY): Payer: Self-pay | Admitting: Physician Assistant

## 2021-05-04 DIAGNOSIS — M545 Low back pain, unspecified: Secondary | ICD-10-CM

## 2021-05-08 ENCOUNTER — Other Ambulatory Visit: Payer: Self-pay

## 2021-05-08 ENCOUNTER — Other Ambulatory Visit (HOSPITAL_COMMUNITY): Payer: Self-pay

## 2021-05-08 ENCOUNTER — Ambulatory Visit (INDEPENDENT_AMBULATORY_CARE_PROVIDER_SITE_OTHER): Payer: Medicare Other | Admitting: Infectious Diseases

## 2021-05-08 ENCOUNTER — Encounter: Payer: Self-pay | Admitting: Infectious Diseases

## 2021-05-08 VITALS — BP 148/92 | HR 103 | Temp 98.5°F | Wt 194.6 lb

## 2021-05-08 DIAGNOSIS — B20 Human immunodeficiency virus [HIV] disease: Secondary | ICD-10-CM

## 2021-05-08 DIAGNOSIS — Z720 Tobacco use: Secondary | ICD-10-CM | POA: Diagnosis not present

## 2021-05-08 DIAGNOSIS — Z79899 Other long term (current) drug therapy: Secondary | ICD-10-CM

## 2021-05-08 DIAGNOSIS — Z113 Encounter for screening for infections with a predominantly sexual mode of transmission: Secondary | ICD-10-CM

## 2021-05-08 DIAGNOSIS — J449 Chronic obstructive pulmonary disease, unspecified: Secondary | ICD-10-CM | POA: Diagnosis not present

## 2021-05-08 DIAGNOSIS — Z7689 Persons encountering health services in other specified circumstances: Secondary | ICD-10-CM | POA: Insufficient documentation

## 2021-05-08 MED ORDER — GENVOYA 150-150-200-10 MG PO TABS
1.0000 | ORAL_TABLET | Freq: Every day | ORAL | 5 refills | Status: DC
  Filled 2021-05-08: qty 30, 30d supply, fill #0
  Filled 2021-06-25: qty 30, 30d supply, fill #1
  Filled 2021-07-17: qty 30, 30d supply, fill #2
  Filled 2021-09-03: qty 30, 30d supply, fill #3
  Filled 2021-10-01: qty 30, 30d supply, fill #4
  Filled 2021-11-06: qty 30, 30d supply, fill #5

## 2021-05-08 NOTE — Progress Notes (Signed)
   Subjective:    Patient ID: Devin [name], male  DOB: 1961-09-27, 60 y.o.        MRN: [id number]   HPI 60 yo M with HIV+ dx 1995, and hx of COPD. Maintained on genvoya (prev took combivir/efavirenz). He had back surgery- L5-S1 fusion ("artificial disks, bone grafts"). 10-2017.  "I have so much going on with me health wise"  Having dental pain, receded below gum line.  Needs ART refilled.  Having a hard time going to sleep due to his breathing. Takes both his albuterol and his combivent to help relieve this (simultaneously).   Having shoulder blade and back pain.   Wants to quit smoking.   UDS 03-2021: benzo, opiates.   HIV 1 RNA Quant  Date Value  07/12/2020 <20 Copies/mL  08/31/2019 <20 NOT DETECTED copies/mL  06/25/2018 <20 NOT DETECTED copies/mL   CD4 T Cell Abs (/uL)  Date Value  07/12/2020 708  08/31/2019 1,091  09/10/2017 900     Health Maintenance  Topic Date Due  . Zoster Vaccines- Shingrix (1 of 2) Never done  . COLONOSCOPY (Pts 45-73yrs Insurance coverage will need to be confirmed)  Never done  . COVID-19 Vaccine (3 - Moderna risk series) 07/05/2020  . TETANUS/TDAP  08/01/2023  . Pneumococcal Vaccine 53-58 Years old (4 - PPSV23 or PCV20) 05/13/2028  . Hepatitis C Screening  Completed  . HIV Screening  Completed  . HPV VACCINES  Aged Out      Review of Systems  Constitutional:  Negative for chills, fever and weight loss.  Respiratory:  Positive for cough, sputum production and shortness of breath.   Cardiovascular:  Positive for chest pain (R sides, similar to when he had his blood clots.).  Gastrointestinal:  Negative for constipation and diarrhea.  Genitourinary:  Negative for dysuria.  Musculoskeletal:  Positive for back pain.  Neurological:  Positive for headaches.   Please see HPI. All other systems reviewed and negative.     Objective:  Physical Exam Vitals reviewed.  Constitutional:      General: He is not in acute distress.    Appearance:  Normal appearance. He is obese. He is not ill-appearing.  HENT:     Mouth/Throat:     Mouth: Mucous membranes are moist.     Dentition: Gingival swelling and dental caries present.     Pharynx: No oropharyngeal exudate.  Eyes:     Extraocular Movements: Extraocular movements intact.     Pupils: Pupils are equal, round, and reactive to light.  Cardiovascular:     Rate and Rhythm: Normal rate and regular rhythm.  Pulmonary:     Effort: Pulmonary effort is normal.     Breath sounds: Normal breath sounds.  Abdominal:     General: Bowel sounds are normal. There is no distension.     Palpations: Abdomen is soft.     Tenderness: There is no abdominal tenderness.  Musculoskeletal:     Cervical back: Normal range of motion and neck supple.     Right lower leg: No edema.     Left lower leg: No edema.  Neurological:     Mental Status: He is alert.  Psychiatric:        Mood and Affect: Mood normal.           Assessment & Plan:

## 2021-05-08 NOTE — Assessment & Plan Note (Signed)
Will send [employer]

## 2021-05-08 NOTE — Assessment & Plan Note (Signed)
Dental referral placed today for [facility]. Information to schedule appointment completed today.   

## 2021-05-08 NOTE — Assessment & Plan Note (Signed)
Will continue genvoya Check his labs today Provided his labs are good, back in 9 months.

## 2021-05-08 NOTE — Assessment & Plan Note (Signed)
Will try to get him in with pulmonary.

## 2021-05-09 ENCOUNTER — Telehealth: Payer: Self-pay

## 2021-05-09 LAB — T-HELPER CELL (CD4) - (RCID CLINIC ONLY)
CD4 % Helper T Cell: 41 % (ref 33–65)
CD4 T Cell Abs: 860 /uL (ref 400–1790)

## 2021-05-09 NOTE — Telephone Encounter (Signed)
Patient's partner called to follow up on antibiotics that they thought were to be sent into pharmacy for patient's dental infection. RN unable to locate prescription. Dental forms completed to be seen with [facility] dental team.   Forwarding to provider for review. ABX can be sent to [facility] pharmacy (if indicated)

## 2021-05-10 ENCOUNTER — Other Ambulatory Visit: Payer: Self-pay | Admitting: Infectious Diseases

## 2021-05-10 DIAGNOSIS — Z7689 Persons encountering health services in other specified circumstances: Secondary | ICD-10-CM

## 2021-05-10 LAB — CBC
HCT: 46.5 % (ref 38.5–50.0)
Hemoglobin: 16 g/dL (ref 13.2–17.1)
MCH: 32.7 pg (ref 27.0–33.0)
MCHC: 34.4 g/dL (ref 32.0–36.0)
MCV: 95.1 fL (ref 80.0–100.0)
MPV: 9.7 fL (ref 7.5–12.5)
Platelets: 239 10*3/uL (ref 140–400)
RBC: 4.89 10*6/uL (ref 4.20–5.80)
RDW: 12.1 % (ref 11.0–15.0)
WBC: 6.1 10*3/uL (ref 3.8–10.8)

## 2021-05-10 LAB — COMPREHENSIVE METABOLIC PANEL
AG Ratio: 1.7 (calc) (ref 1.0–2.5)
ALT: 22 U/L (ref 9–46)
AST: 21 U/L (ref 10–35)
Albumin: 4.7 g/dL (ref 3.6–5.1)
Alkaline phosphatase (APISO): 64 U/L (ref 35–144)
BUN: 9 mg/dL (ref 7–25)
CO2: 32 mmol/L (ref 20–32)
Calcium: 10.1 mg/dL (ref 8.6–10.3)
Chloride: 99 mmol/L (ref 98–110)
Creat: 1.14 mg/dL (ref 0.70–1.30)
Globulin: 2.7 g/dL (calc) (ref 1.9–3.7)
Glucose, Bld: 143 mg/dL — ABNORMAL HIGH (ref 65–99)
Potassium: 4.3 mmol/L (ref 3.5–5.3)
Sodium: 139 mmol/L (ref 135–146)
Total Bilirubin: 0.3 mg/dL (ref 0.2–1.2)
Total Protein: 7.4 g/dL (ref 6.1–8.1)

## 2021-05-10 LAB — HIV-1 RNA QUANT-NO REFLEX-BLD
HIV 1 RNA Quant: NOT DETECTED Copies/mL
HIV-1 RNA Quant, Log: NOT DETECTED Log cps/mL

## 2021-05-10 LAB — RPR: RPR Ser Ql: NONREACTIVE

## 2021-05-10 LAB — PSA: PSA: 0.37 ng/mL (ref <=4.00)

## 2021-05-10 MED ORDER — AMOXICILLIN-POT CLAVULANATE 875-125 MG PO TABS: 1.0000 | ORAL_TABLET | Freq: Two times a day (BID) | ORAL | 0 refills | Status: DC

## 2021-05-10 NOTE — Telephone Encounter (Signed)
Patient's partner contacted team to report that since taking the abx he's been violently throwing up. Has been taking with food however hasn't been able to keep the medication down. Requesting an alternative. Reports that he's able to tolerate just "plain amoxicillin" and not the "amoxicillin clavulanate" that was prescribed.

## 2021-05-11 ENCOUNTER — Other Ambulatory Visit: Payer: Self-pay | Admitting: Infectious Diseases

## 2021-05-11 DIAGNOSIS — Z7689 Persons encountering health services in other specified circumstances: Secondary | ICD-10-CM

## 2021-05-11 MED ORDER — AMOXICILLIN 500 MG PO TABS: 500.0000 mg | ORAL_TABLET | Freq: Three times a day (TID) | ORAL | 0 refills | Status: AC

## 2021-05-17 ENCOUNTER — Encounter (HOSPITAL_COMMUNITY): Payer: Self-pay | Admitting: Psychiatry

## 2021-05-17 ENCOUNTER — Ambulatory Visit (INDEPENDENT_AMBULATORY_CARE_PROVIDER_SITE_OTHER): Payer: Medicare Other | Admitting: Psychiatry

## 2021-05-17 ENCOUNTER — Other Ambulatory Visit: Payer: Self-pay

## 2021-05-17 DIAGNOSIS — F331 Major depressive disorder, recurrent, moderate: Secondary | ICD-10-CM
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
## 2021-05-18 ENCOUNTER — Other Ambulatory Visit (HOSPITAL_COMMUNITY): Payer: Self-pay

## 2021-05-21 ENCOUNTER — Other Ambulatory Visit (HOSPITAL_COMMUNITY): Payer: Self-pay

## 2021-05-29 ENCOUNTER — Ambulatory Visit: Payer: Medicare Other | Admitting: Neurology

## 2021-05-31 ENCOUNTER — Other Ambulatory Visit: Payer: Self-pay

## 2021-05-31 ENCOUNTER — Ambulatory Visit (INDEPENDENT_AMBULATORY_CARE_PROVIDER_SITE_OTHER): Payer: Medicare Other | Admitting: Psychiatry

## 2021-05-31 DIAGNOSIS — F331 Major depressive disorder, recurrent, moderate: Secondary | ICD-10-CM

## 2021-05-31 NOTE — Progress Notes (Signed)
Virtual Visit via Video Note  I connected with Devin [name] on 05/31/21 at 10:16 AM EDT  by a video enabled telemedicine application and verified that I am speaking with the correct person using two identifiers.  Location: Patient: Home Provider:  [facility] Outpatient [place] office    I discussed the limitations of evaluation and management by telemedicine and the availability of in person appointments. The patient expressed understanding and agreed to proceed.   I provided 44 minutes of non-face-to-face time during this encounter.   Adah Salvage, LCSW    THERAPIST PROGRESS NOTE  Session Time: Thursday 05/31/2021 10:16 AM - 11:00 AM   Participation Level: Active  Behavioral Response: CasualAlertAnxious and Depressed  Type of Therapy: Individual Therapy  Treatment Goals addressed: Alleviate depressed mood and return to previous level of effective functioning as evidenced by patient reducing crying episodes to 2 times per week for 3 consecutive weeks  Interventions: CBT and Supportive  Summary: Devin [name] is a 60 y.o. male who is referred for services by Dr. Sudie Bailey.  He reports 2 psychiatric hospitalizations with the last 1 occurring about 14 years ago.  He denies any involvement in outpatient therapy.  Patient reports being worried about son and grandson.  Per his report, he made a statement on Facebook he should not have along with calling and saying things he should not have.  He reports apologizing but says son now will not have anything to do with him or allow him to see his grandson.  He last saw son on Christmas.  He also reports stress related to mother having Alzheimer's and his father's death in 02/15/2019.  Current symptoms include sleep difficulty, crying spells, depressed mood, difficulty concentrating, loss of appetite, fatigue, muscle tension, and excessive worrying.  Patient last was seen via virtual visit for the assessment appointment about 2 weeks ago.  He reports little  to no change in symptoms.  He continues to report ruminating thoughts about his son and grandson.  Patient also reports crying spells daily.    Suicidal/Homicidal: Nowithout intent/plan  Therapist Response: Reviewed symptoms, discussed stressors, facilitated expression of thoughts and feelings, validated feelings, established therapeutic alliance, developed treatment plan, obtained patient's verbal consent to initial plan for patient as this was a virtual visit  Plan: Return again in 2 weeks.  Diagnosis: Axis I: MDD        Adah Salvage, LCSW 05/31/2021

## 2021-06-05 ENCOUNTER — Other Ambulatory Visit (HOSPITAL_COMMUNITY): Payer: Self-pay

## 2021-06-07 ENCOUNTER — Other Ambulatory Visit (HOSPITAL_COMMUNITY): Payer: Self-pay

## 2021-06-14 ENCOUNTER — Other Ambulatory Visit: Payer: Self-pay

## 2021-06-14 ENCOUNTER — Ambulatory Visit (INDEPENDENT_AMBULATORY_CARE_PROVIDER_SITE_OTHER): Payer: Medicare Other | Admitting: Psychiatry

## 2021-06-14 DIAGNOSIS — F331 Major depressive disorder, recurrent, moderate: Secondary | ICD-10-CM | POA: Diagnosis not present

## 2021-06-14 NOTE — Progress Notes (Signed)
Virtual Visit via Video Note  I connected with Devin [name] on 06/14/21 at 11:15 AM EDT  by a video enabled telemedicine application and verified that I am speaking with the correct person using two identifiers.  Location: Patient:  Home Provider: [facility] [place] office    I discussed the limitations of evaluation and management by telemedicine and the availability of in person appointments. The patient expressed understanding and agreed to proceed.   I provided 44 minutes of non-face-to-face time during this encounter.   Adah Salvage, LCSW    THERAPIST PROGRESS NOTE  Session Time: Thursday 06/14/2021 10:16 AM - 11:00 AM   Participation Level: Active  Behavioral Response: CasualAlert/less depressed/less anxious/still tearful at times  Type of Therapy: Individual Therapy  Treatment Goals addressed: Alleviate depressed mood and return to previous level of effective functioning as evidenced by patient reducing crying episodes to 2 times per week for 3 consecutive weeks  Interventions: CBT and Supportive  Summary: Devin [name] is a 60 y.o. male who is referred for services by Dr. Sudie Bailey.  He reports 2 psychiatric hospitalizations with the last 1 occurring about 14 years ago.  He denies any involvement in outpatient therapy.  Patient reports being worried about son and grandson.  Per his report, he made a statement on Facebook he should not have along with calling and saying things he should not have.  He reports apologizing but says son now will not have anything to do with him or allow him to see his grandson.  He last saw son on Christmas.  He also reports stress related to mother having Alzheimer's and his father's death in 01-Mar-2019.  Current symptoms include sleep difficulty, crying spells, depressed mood, difficulty concentrating, loss of appetite, fatigue, muscle tension, and excessive worrying.  Patient last was seen via virtual visit for the assessment appointment about  2 weeks ago.  He reports being less depressed since last session.  He reports continued attempts to try to contact his son via text and expresses frustration son does not respond.  He states he is going to have to accept that his son is just the way he is.  He continues to express sadness he has not seen his grandson but reports decreased ruminating thoughts.  He reports multiple responsibilities especially in taking care of his mother.  He reports he has felt better emotionally since he recently mowed his yard although it hurt his back.    Suicidal/Homicidal: Nowithout intent/plan  Therapist Response: Reviewed symptoms, discussed stressors, facilitated expression of thoughts and feelings, validated feelings, praised and reinforced patient's efforts to accept and respect son's boundaries, assisted patient began to identify realistic expectations, praised and reinforced patient's involvement in activity, discussed effects on his mood, discussed pacing self, also discussed the role of behavioral activation in overcoming depression, developed plan with patient to try to participate in 1 pleasurable activity per week, will send patient activity menu to assist him in his efforts   Plan: Return again in 2 weeks.  Diagnosis: Axis I: MDD        Adah Salvage, LCSW 06/14/2021

## 2021-06-25 ENCOUNTER — Other Ambulatory Visit (HOSPITAL_COMMUNITY): Payer: Self-pay

## 2021-06-28 ENCOUNTER — Ambulatory Visit (INDEPENDENT_AMBULATORY_CARE_PROVIDER_SITE_OTHER): Payer: Medicare Other | Admitting: Psychiatry

## 2021-06-28 ENCOUNTER — Other Ambulatory Visit (HOSPITAL_COMMUNITY): Payer: Self-pay

## 2021-06-28 ENCOUNTER — Other Ambulatory Visit: Payer: Self-pay

## 2021-06-28 DIAGNOSIS — F331 Major depressive disorder, recurrent, moderate: Secondary | ICD-10-CM | POA: Diagnosis not present

## 2021-06-28 NOTE — Progress Notes (Signed)
Virtual Visit via Video Note  I connected with Devin [name] on 06/28/21 at 10:15 AM EDT by a video enabled telemedicine application and verified that I am speaking with the correct person using two identifiers.  Location: Patient: Home Provider: [facility] Outpatient [place] office    I discussed the limitations of evaluation and management by telemedicine and the availability of in person appointments. The patient expressed understanding and agreed to proceed.   I provided 45 minutes of non-face-to-face time during this encounter.   Adah Salvage, LCSW     THERAPIST PROGRESS NOTE  Session Time: Thursday 06/28/2021 10:15 AM - 11:00 AM   Participation Level: Active  Behavioral Response: CasualAlert/less depressed/less anxious/still tearful at times  Type of Therapy: Individual Therapy  Treatment Goals addressed: Alleviate depressed mood and return to previous level of effective functioning as evidenced by patient reducing crying episodes to 2 times per week for 3 consecutive weeks  Interventions: CBT and Supportive  Summary: Devin [name] is a 60 y.o. male who is referred for services by Dr. Sudie Bailey.  He reports 2 psychiatric hospitalizations with the last 1 occurring about 14 years ago.  He denies any involvement in outpatient therapy.  Patient reports being worried about son and grandson.  Per his report, he made a statement on Facebook he should not have along with calling and saying things he should not have.  He reports apologizing but says son now will not have anything to do with him or allow him to see his grandson.  He last saw son on Christmas.  He also reports stress related to mother having Alzheimer's and his father's death in 02/20/19.  Current symptoms include sleep difficulty, crying spells, depressed mood, difficulty concentrating, loss of appetite, fatigue, muscle tension, and excessive worrying.  Patient last was seen via virtual visit about 2 weeks ago.  He reports having  2 crying episodes since last session.  These all were triggered by ruminating thoughts about his son and grandson.  He reports he sent a text to his son and received a positive response to his request about taking items to his son's home for son and grandson.  Patient did not actually see his son or grandson but did leave the items.  He is pleased son allowed him to do this but still expresses sadness and frustration they do not have a close relationship.  He also is pleased he was able to respect his sons limits and did not push his son for more conversation/contact.  Patient reports still trying to maintain involvement in activity which has been helpful.  However, he reports increased back pain his prohibited some involvement.  Patient reports information sent via mail is probably at his address.  Therapist and patient agreed therapist will see in forms to patient's mother's address where he currently is residing.  Suicidal/Homicidal: Nowithout intent/plan  Therapist Response: Reviewed symptoms, discussed stressors, facilitated expression of thoughts and feelings, validated feelings, praised and reinforced patient's efforts to accept and respect son's boundaries, dilatated patient sharing more information about the historical context of ongoing conflict with son, assisted patient identify the effects of ruminating thoughts, encourage patient to continue to maintain involvement in activity, will send patient handouts (weekly planner/activity log, designated worry period, leaves on a stream)  Plan: Return again in 2 weeks.  Diagnosis: Axis I: MDD        Adah Salvage, LCSW 06/28/2021

## 2021-07-03 ENCOUNTER — Encounter: Payer: Self-pay | Admitting: Neurology

## 2021-07-03 ENCOUNTER — Ambulatory Visit: Payer: Medicare Other | Admitting: Neurology

## 2021-07-17 ENCOUNTER — Other Ambulatory Visit (HOSPITAL_COMMUNITY): Payer: Self-pay

## 2021-07-20 ENCOUNTER — Other Ambulatory Visit (HOSPITAL_COMMUNITY): Payer: Self-pay

## 2021-08-06 ENCOUNTER — Ambulatory Visit: Payer: Medicare Other | Admitting: Orthopedic Surgery

## 2021-08-16 ENCOUNTER — Other Ambulatory Visit (HOSPITAL_COMMUNITY): Payer: Self-pay

## 2021-08-30 ENCOUNTER — Other Ambulatory Visit (HOSPITAL_COMMUNITY): Payer: Self-pay

## 2021-09-03 ENCOUNTER — Other Ambulatory Visit (HOSPITAL_COMMUNITY): Payer: Self-pay

## 2021-09-04 ENCOUNTER — Other Ambulatory Visit (HOSPITAL_COMMUNITY): Payer: Self-pay

## 2021-10-01 ENCOUNTER — Other Ambulatory Visit (HOSPITAL_COMMUNITY): Payer: Self-pay

## 2021-10-04 ENCOUNTER — Other Ambulatory Visit (HOSPITAL_COMMUNITY): Payer: Self-pay

## 2021-10-29 ENCOUNTER — Other Ambulatory Visit (HOSPITAL_COMMUNITY): Payer: Self-pay

## 2021-11-01 ENCOUNTER — Other Ambulatory Visit (HOSPITAL_COMMUNITY): Payer: Self-pay

## 2021-11-05 ENCOUNTER — Other Ambulatory Visit (HOSPITAL_COMMUNITY): Payer: Self-pay

## 2021-11-06 ENCOUNTER — Other Ambulatory Visit (HOSPITAL_COMMUNITY): Payer: Self-pay

## 2021-11-08 ENCOUNTER — Ambulatory Visit: Payer: Medicare Other | Admitting: Infectious Diseases

## 2021-11-21 ENCOUNTER — Other Ambulatory Visit (HOSPITAL_COMMUNITY): Payer: Self-pay | Admitting: Nurse Practitioner

## 2021-11-21 DIAGNOSIS — R0602 Shortness of breath: Secondary | ICD-10-CM

## 2021-11-23 ENCOUNTER — Ambulatory Visit: Payer: Medicare Other | Admitting: Infectious Diseases

## 2021-11-26 ENCOUNTER — Other Ambulatory Visit: Payer: Self-pay | Admitting: Infectious Diseases

## 2021-11-26 ENCOUNTER — Other Ambulatory Visit (HOSPITAL_COMMUNITY): Payer: Self-pay

## 2021-11-26 DIAGNOSIS — B20 Human immunodeficiency virus [HIV] disease: Secondary | ICD-10-CM

## 2021-11-26 MED ORDER — GENVOYA 150-150-200-10 MG PO TABS
1.0000 | ORAL_TABLET | Freq: Every day | ORAL | 5 refills | Status: DC
  Filled 2021-12-10: qty 30, 30d supply, fill #0
  Filled 2022-01-25: qty 30, 30d supply, fill #1
  Filled 2022-03-01: qty 30, 30d supply, fill #2
  Filled 2022-03-26: qty 30, 30d supply, fill #3
  Filled 2022-06-10: qty 30, 30d supply, fill #4
  Filled 2022-06-28: qty 30, 30d supply, fill #5

## 2021-12-06 ENCOUNTER — Ambulatory Visit (INDEPENDENT_AMBULATORY_CARE_PROVIDER_SITE_OTHER): Payer: Medicare Other | Admitting: Orthopedic Surgery

## 2021-12-06 ENCOUNTER — Encounter: Payer: Self-pay | Admitting: Orthopedic Surgery

## 2021-12-06 ENCOUNTER — Ambulatory Visit: Payer: Medicare Other

## 2021-12-06 ENCOUNTER — Other Ambulatory Visit: Payer: Self-pay

## 2021-12-06 VITALS — BP 146/100 | HR 99 | Ht 73.0 in | Wt 230.4 lb

## 2021-12-06 DIAGNOSIS — M25561 Pain in right knee: Secondary | ICD-10-CM

## 2021-12-06 DIAGNOSIS — M25571 Pain in right ankle and joints of right foot: Secondary | ICD-10-CM

## 2021-12-06 NOTE — Patient Instructions (Signed)
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
## 2021-12-10 ENCOUNTER — Other Ambulatory Visit (HOSPITAL_COMMUNITY): Payer: Self-pay

## 2021-12-20 ENCOUNTER — Ambulatory Visit: Payer: Medicare Other | Admitting: Infectious Diseases

## 2022-01-02 ENCOUNTER — Other Ambulatory Visit (HOSPITAL_COMMUNITY): Payer: Self-pay

## 2022-01-08 ENCOUNTER — Other Ambulatory Visit (HOSPITAL_COMMUNITY): Payer: Self-pay

## 2022-01-14 ENCOUNTER — Institutional Professional Consult (permissible substitution): Payer: Medicare Other | Admitting: Internal Medicine

## 2022-01-14 NOTE — Progress Notes (Incomplete)
? ?Devin [name], male    DOB: 10/09/1961, 61 y.o.   MRN: [id number] ? ? ?Brief patient profile:  ?***  yo*** *** referred to pulmonary clinic in [place]  01/14/2022 by *** for ***  ? ? ? ? ?History of Present Illness  ?01/14/2022  Pulmonary/ 1st office eval/ [facility] / [facility]  ?No chief complaint on file. ?  ? ?Dyspnea:  *** ?Cough: *** ?Sleep: *** ?SABA use:  ? ?Past Medical History:  ?Diagnosis Date  ? Anxiety   ? Arthritis   ? "I'm eat up w/it" (02/20/2017)  ? Asthma   ? Chronic back pain   ? "the whole back" (02/20/2017)  ? Chronic bronchitis (HCC)   ? Complication of anesthesia   ? "felt like I couldn't breath coming out of it"  ? COPD (chronic obstructive pulmonary disease) (HCC)   ? Depression   ? GERD (gastroesophageal reflux disease)   ? History of hiatal hernia   ? History of stomach ulcers   ? "bleeding ones; I was young then"  ? HIV infection (HCC) dx'd ~ 1999  ? Hypertension   ? Pneumonia   ? "several times" (02/20/2017)  ? Prolapsed disk 10/28  ? Pulmonary embolism (HCC) 02/20/2017  ? Scoliosis 08/24/13  ? ? ?Outpatient Medications Prior to Visit  ?Medication Sig Dispense Refill  ? albuterol (PROVENTIL HFA;VENTOLIN HFA) 108 (90 BASE) MCG/ACT inhaler Inhale 2 puffs into the lungs every 4 (four) hours as needed for wheezing or shortness of breath.    ? albuterol (PROVENTIL) (2.5 MG/3ML) 0.083% nebulizer solution Take 2.5 mg by nebulization every 6 (six) hours as needed for wheezing or shortness of breath.    ? amitriptyline (ELAVIL) 50 MG tablet Take 50 mg by mouth at bedtime as needed for sleep.     ? calcium carbonate (TUMS EX) 750 MG chewable tablet Chew 2 tablets by mouth as needed for heartburn.    ? cholecalciferol (VITAMIN D3) 25 MCG (1000 UNIT) tablet Take 1,000 Units by mouth daily.    ? COMBIVENT RESPIMAT 20-100 MCG/ACT AERS respimat Inhale 2 puffs into the lungs every 4 (four) hours as needed. wheezing    ? cyclobenzaprine (FLEXERIL) 10 MG tablet Take 10 mg by mouth 3 (three) times daily  as needed for muscle spasms.    ? diazepam (VALIUM) 10 MG tablet Take 10 mg by mouth 3 (three) times daily as needed for anxiety.     ? docusate sodium (COLACE) 100 MG capsule Take 100 mg by mouth daily.    ? elvitegravir-cobicistat-emtricitabine-tenofovir (GENVOYA) 150-150-200-10 MG TABS tablet Take 1 tablet by mouth daily with breakfast. 30 tablet 2  ? elvitegravir-cobicistat-emtricitabine-tenofovir (GENVOYA) 150-150-200-10 MG TABS tablet Take 1 tablet by mouth daily with breakfast. 30 tablet 5  ? Multiple Vitamins-Minerals (MULTI FOR HIM 50+) TABS Take 1 tablet by mouth daily.    ? oxyCODONE (OXYCONTIN) 15 mg 12 hr tablet Take 1 tablet (15 mg total) by mouth every 12 (twelve) hours. 14 tablet 0  ? Oxycodone HCl 10 MG TABS Take 10-20 mg by mouth every 6 (six) hours as needed for pain.    ? pantoprazole (PROTONIX) 40 MG tablet Take 40 mg by mouth daily.    ? pregabalin (LYRICA) 300 MG capsule Take 300 mg by mouth every 12 (twelve) hours.    ? ?No facility-administered medications prior to visit.  ? ? ? ?Objective:  ?  ? ?There were no vitals taken for this visit. ? ?  ? ? ?   ?  Assessment  ? ?No problem-specific Assessment & Plan notes found for this encounter. ? ? ? ? ?Sandrea Hughs, MD ?01/14/2022 ?   ?

## 2022-01-16 ENCOUNTER — Other Ambulatory Visit (HOSPITAL_COMMUNITY)
Admission: RE | Admit: 2022-01-16 | Discharge: 2022-01-16 | Disposition: A | Discharge status: Home / Self Care | Payer: Medicare Other | Source: Other Acute Inpatient Hospital | Attending: Family Medicine | Admitting: Family Medicine

## 2022-01-16 DIAGNOSIS — Z79891 Long term (current) use of opiate analgesic: Secondary | ICD-10-CM | POA: Insufficient documentation

## 2022-01-16 LAB — RAPID URINE DRUG SCREEN, HOSP PERFORMED
Amphetamines: NOT DETECTED
Barbiturates: NOT DETECTED
Benzodiazepines: POSITIVE — AB
Cocaine: NOT DETECTED
Opiates: POSITIVE — AB
Tetrahydrocannabinol: NOT DETECTED

## 2022-01-25 ENCOUNTER — Other Ambulatory Visit (HOSPITAL_COMMUNITY): Payer: Self-pay

## 2022-02-01 ENCOUNTER — Other Ambulatory Visit (HOSPITAL_COMMUNITY): Payer: Self-pay | Admitting: Nurse Practitioner

## 2022-02-01 DIAGNOSIS — R1084 Generalized abdominal pain: Secondary | ICD-10-CM

## 2022-02-04 ENCOUNTER — Other Ambulatory Visit (HOSPITAL_COMMUNITY): Payer: Self-pay

## 2022-02-25 ENCOUNTER — Other Ambulatory Visit (HOSPITAL_COMMUNITY): Payer: Self-pay

## 2022-02-27 ENCOUNTER — Other Ambulatory Visit (HOSPITAL_COMMUNITY): Payer: Self-pay

## 2022-03-01 ENCOUNTER — Other Ambulatory Visit (HOSPITAL_COMMUNITY): Payer: Self-pay

## 2022-03-05 ENCOUNTER — Other Ambulatory Visit (HOSPITAL_COMMUNITY): Payer: Self-pay

## 2022-03-07 ENCOUNTER — Institutional Professional Consult (permissible substitution): Payer: Medicare Other | Admitting: Internal Medicine

## 2022-03-07 NOTE — Progress Notes (Incomplete)
? ?Devin [name], male    DOB: Dec 09, 1960, 61 y.o.   MRN: [id number] ? ? ?Brief patient profile:  ?***  yo*** *** referred to pulmonary clinic in   03/07/2022 by *** for ***  ? ? ?July 18, 2010  1st pulmonary office eval doe x 4 year worse x 6 months to point where fast pace  x maybe a block no better with combivent assoc with congested cough worse in am's with mucoid sputum and mild heartburn. ?Rec ?1)  Protonix 40 mg Take one 30-60 min before first and last meals of the day  ?2)  Symbicort 160 2 puffs first thing  in am and 2 puffs again in pm about 12 hours later ?3)  Work on inhaler technique:  relax and blow all the way out then take a nice smooth deep breath back in, triggering the inhaler at same time you start breathing in hold a few seconds ?4)  Continue combivent 2 puffs ever 4 hours if needed ?5)  GERD diet reviewed, bed blocks rec  ?>>> Please schedule a follow-up appointment in 4 weeks, sooner if needed  with PFT's> did not return ?  ? ?History of Present Illness  ?03/07/2022  Pulmonary/ 1st office eval/ [facility] / [facility]  ?No chief complaint on file. ?  ? ?Dyspnea:  *** ?Cough: *** ?Sleep: *** ?SABA use:  ? ?Past Medical History:  ?Diagnosis Date  ? Anxiety   ? Arthritis   ? "I'm eat up w/it" (02/20/2017)  ? Asthma   ? Chronic back pain   ? "the whole back" (02/20/2017)  ? Chronic bronchitis (HCC)   ? Complication of anesthesia   ? "felt like I couldn't breath coming out of it"  ? COPD (chronic obstructive pulmonary disease) (HCC)   ? Depression   ? GERD (gastroesophageal reflux disease)   ? History of hiatal hernia   ? History of stomach ulcers   ? "bleeding ones; I was young then"  ? HIV infection (HCC) dx'd ~ 1999  ? Hypertension   ? Pneumonia   ? "several times" (02/20/2017)  ? Prolapsed disk 10/28  ? Pulmonary embolism (HCC) 02/20/2017  ? Scoliosis 08/24/13  ? ? ?Outpatient Medications Prior to Visit  ?Medication Sig Dispense Refill  ? albuterol (PROVENTIL HFA;VENTOLIN HFA) 108 (90  BASE) MCG/ACT inhaler Inhale 2 puffs into the lungs every 4 (four) hours as needed for wheezing or shortness of breath.    ? albuterol (PROVENTIL) (2.5 MG/3ML) 0.083% nebulizer solution Take 2.5 mg by nebulization every 6 (six) hours as needed for wheezing or shortness of breath.    ? amitriptyline (ELAVIL) 50 MG tablet Take 50 mg by mouth at bedtime as needed for sleep.     ? calcium carbonate (TUMS EX) 750 MG chewable tablet Chew 2 tablets by mouth as needed for heartburn.    ? cholecalciferol (VITAMIN D3) 25 MCG (1000 UNIT) tablet Take 1,000 Units by mouth daily.    ? COMBIVENT RESPIMAT 20-100 MCG/ACT AERS respimat Inhale 2 puffs into the lungs every 4 (four) hours as needed. wheezing    ? cyclobenzaprine (FLEXERIL) 10 MG tablet Take 10 mg by mouth 3 (three) times daily as needed for muscle spasms.    ? diazepam (VALIUM) 10 MG tablet Take 10 mg by mouth 3 (three) times daily as needed for anxiety.     ? docusate sodium (COLACE) 100 MG capsule Take 100 mg by mouth daily.    ? elvitegravir-cobicistat-emtricitabine-tenofovir (GENVOYA) 150-150-200-10 MG TABS  tablet Take 1 tablet by mouth daily with breakfast. 30 tablet 5  ? Multiple Vitamins-Minerals (MULTI FOR HIM 50+) TABS Take 1 tablet by mouth daily.    ? oxyCODONE (OXYCONTIN) 15 mg 12 hr tablet Take 1 tablet (15 mg total) by mouth every 12 (twelve) hours. 14 tablet 0  ? Oxycodone HCl 10 MG TABS Take 10-20 mg by mouth every 6 (six) hours as needed for pain.    ? pantoprazole (PROTONIX) 40 MG tablet Take 40 mg by mouth daily.    ? pregabalin (LYRICA) 300 MG capsule Take 300 mg by mouth every 12 (twelve) hours.    ? ?No facility-administered medications prior to visit.  ? ? ? ?Objective:  ?  ? ?There were no vitals taken for this visit. ? ?  ? ? ?   ?Assessment  ? ?No problem-specific Assessment & Plan notes found for this encounter. ? ? ? ? ?Sandrea Hughs, MD ?03/07/2022 ?   ?

## 2022-03-08 ENCOUNTER — Ambulatory Visit (HOSPITAL_COMMUNITY): Admission: RE | Admit: 2022-03-08 | Payer: Medicare Other | Source: Ambulatory Visit

## 2022-03-12 ENCOUNTER — Ambulatory Visit (HOSPITAL_COMMUNITY): Payer: Medicare Other

## 2022-03-19 ENCOUNTER — Ambulatory Visit (HOSPITAL_COMMUNITY)
Admission: RE | Admit: 2022-03-19 | Discharge: 2022-03-19 | Disposition: A | Discharge status: Home / Self Care | Payer: Medicare Other | Source: Ambulatory Visit | Attending: Nurse Practitioner | Admitting: Nurse Practitioner

## 2022-03-19 DIAGNOSIS — R1084 Generalized abdominal pain: Secondary | ICD-10-CM | POA: Insufficient documentation

## 2022-03-19 MED ORDER — IOHEXOL 300 MG/ML  SOLN
100.0000 mL | Freq: Once | INTRAMUSCULAR | Status: AC | PRN
Start: 2022-03-19 — End: 2022-03-19
  Administered 2022-03-19: 100 mL via INTRAVENOUS

## 2022-03-22 ENCOUNTER — Other Ambulatory Visit (HOSPITAL_COMMUNITY): Payer: Self-pay

## 2022-03-26 ENCOUNTER — Other Ambulatory Visit (HOSPITAL_COMMUNITY): Payer: Self-pay

## 2022-04-03 ENCOUNTER — Other Ambulatory Visit (HOSPITAL_COMMUNITY): Payer: Self-pay

## 2022-04-04 ENCOUNTER — Ambulatory Visit (INDEPENDENT_AMBULATORY_CARE_PROVIDER_SITE_OTHER): Payer: Medicare Other | Admitting: Infectious Diseases

## 2022-04-04 ENCOUNTER — Encounter: Payer: Self-pay | Admitting: Infectious Diseases

## 2022-04-04 ENCOUNTER — Other Ambulatory Visit: Payer: Self-pay

## 2022-04-04 VITALS — BP 160/109 | HR 91 | Temp 97.6°F | Wt 235.0 lb

## 2022-04-04 DIAGNOSIS — I10 Essential (primary) hypertension: Secondary | ICD-10-CM | POA: Diagnosis not present

## 2022-04-04 DIAGNOSIS — Z7689 Persons encountering health services in other specified circumstances: Secondary | ICD-10-CM

## 2022-04-04 DIAGNOSIS — M2351 Chronic instability of knee, right knee: Secondary | ICD-10-CM

## 2022-04-04 DIAGNOSIS — F17200 Nicotine dependence, unspecified, uncomplicated: Secondary | ICD-10-CM

## 2022-04-04 DIAGNOSIS — Z113 Encounter for screening for infections with a predominantly sexual mode of transmission: Secondary | ICD-10-CM

## 2022-04-04 DIAGNOSIS — Z79899 Other long term (current) drug therapy: Secondary | ICD-10-CM

## 2022-04-04 DIAGNOSIS — J449 Chronic obstructive pulmonary disease, unspecified: Secondary | ICD-10-CM

## 2022-04-04 DIAGNOSIS — B20 Human immunodeficiency virus [HIV] disease: Secondary | ICD-10-CM

## 2022-04-04 NOTE — Assessment & Plan Note (Addendum)
PCV 20 at 61 yo Needs shingles vax Not sure he could tolerate colon.  Will get him into dental Will check his labs today Continue his genvoya rtc in 6 months

## 2022-04-04 NOTE — Assessment & Plan Note (Signed)
Appreciate Pulm f/u.

## 2022-04-04 NOTE — Assessment & Plan Note (Addendum)
Encouraged him to stop smoking, he knows this has been damaging to his health.

## 2022-04-04 NOTE — Assessment & Plan Note (Signed)
Will get him in with Ortho.

## 2022-04-04 NOTE — Assessment & Plan Note (Signed)
Dental referral placed today for [facility]. Information to schedule appointment completed today.   
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
## 2022-04-04 NOTE — Assessment & Plan Note (Signed)
Appreciate his PCP f/u.

## 2022-04-05 LAB — T-HELPER CELL (CD4) - (RCID CLINIC ONLY)
CD4 % Helper T Cell: 39 % (ref 33–65)
CD4 T Cell Abs: 626 /uL (ref 400–1790)

## 2022-04-08 ENCOUNTER — Encounter: Payer: Self-pay | Admitting: Infectious Diseases

## 2022-04-09 LAB — CBC
HCT: 47.9 % (ref 38.5–50.0)
Hemoglobin: 16.3 g/dL (ref 13.2–17.1)
MCH: 32.7 pg (ref 27.0–33.0)
MCHC: 34 g/dL (ref 32.0–36.0)
MCV: 96 fL (ref 80.0–100.0)
MPV: 10 fL (ref 7.5–12.5)
Platelets: 188 10*3/uL (ref 140–400)
RBC: 4.99 10*6/uL (ref 4.20–5.80)
RDW: 12.4 % (ref 11.0–15.0)
WBC: 4.9 10*3/uL (ref 3.8–10.8)

## 2022-04-09 LAB — HIV-1 RNA QUANT-NO REFLEX-BLD
HIV 1 RNA Quant: NOT DETECTED Copies/mL
HIV-1 RNA Quant, Log: NOT DETECTED Log cps/mL

## 2022-04-09 LAB — COMPREHENSIVE METABOLIC PANEL
AG Ratio: 1.6 (calc) (ref 1.0–2.5)
ALT: 40 U/L (ref 9–46)
AST: 52 U/L — ABNORMAL HIGH (ref 10–35)
Albumin: 4.7 g/dL (ref 3.6–5.1)
Alkaline phosphatase (APISO): 73 U/L (ref 35–144)
BUN: 7 mg/dL (ref 7–25)
CO2: 28 mmol/L (ref 20–32)
Calcium: 10 mg/dL (ref 8.6–10.3)
Chloride: 100 mmol/L (ref 98–110)
Creat: 1.08 mg/dL (ref 0.70–1.30)
Globulin: 3 g/dL (calc) (ref 1.9–3.7)
Glucose, Bld: 204 mg/dL — ABNORMAL HIGH (ref 65–99)
Potassium: 4.2 mmol/L (ref 3.5–5.3)
Sodium: 139 mmol/L (ref 135–146)
Total Bilirubin: 0.4 mg/dL (ref 0.2–1.2)
Total Protein: 7.7 g/dL (ref 6.1–8.1)

## 2022-04-09 LAB — LIPID PANEL
Cholesterol: 241 mg/dL — ABNORMAL HIGH (ref <200)
HDL: 36 mg/dL — ABNORMAL LOW (ref >=40)
Non-HDL Cholesterol (Calc): 205 mg/dL (calc) — ABNORMAL HIGH (ref <130)
Total CHOL/HDL Ratio: 6.7 (calc) — ABNORMAL HIGH (ref <5.0)
Triglycerides: 574 mg/dL — ABNORMAL HIGH (ref <150)

## 2022-04-09 LAB — RPR: RPR Ser Ql: NONREACTIVE

## 2022-04-16 ENCOUNTER — Institutional Professional Consult (permissible substitution): Payer: Medicare Other | Admitting: Internal Medicine

## 2022-04-23 ENCOUNTER — Other Ambulatory Visit (HOSPITAL_COMMUNITY): Payer: Self-pay

## 2022-04-24 ENCOUNTER — Other Ambulatory Visit: Payer: Self-pay

## 2022-04-24 ENCOUNTER — Emergency Department (HOSPITAL_COMMUNITY)
Admission: EM | Admit: 2022-04-24 | Discharge: 2022-04-24 | Disposition: A | Discharge status: Home / Self Care | Payer: Medicare Other | Attending: Student | Admitting: Student

## 2022-04-24 ENCOUNTER — Emergency Department (HOSPITAL_COMMUNITY): Payer: Medicare Other

## 2022-04-24 ENCOUNTER — Encounter (HOSPITAL_COMMUNITY): Payer: Self-pay | Admitting: *Deleted

## 2022-04-24 DIAGNOSIS — Z7951 Long term (current) use of inhaled steroids: Secondary | ICD-10-CM | POA: Diagnosis not present

## 2022-04-24 DIAGNOSIS — F17210 Nicotine dependence, cigarettes, uncomplicated: Secondary | ICD-10-CM | POA: Diagnosis not present

## 2022-04-24 DIAGNOSIS — J45909 Unspecified asthma, uncomplicated: Secondary | ICD-10-CM | POA: Insufficient documentation

## 2022-04-24 DIAGNOSIS — J449 Chronic obstructive pulmonary disease, unspecified: Secondary | ICD-10-CM | POA: Diagnosis not present

## 2022-04-24 DIAGNOSIS — I10 Essential (primary) hypertension: Secondary | ICD-10-CM | POA: Insufficient documentation

## 2022-04-24 DIAGNOSIS — Z79899 Other long term (current) drug therapy: Secondary | ICD-10-CM | POA: Insufficient documentation

## 2022-04-24 DIAGNOSIS — J441 Chronic obstructive pulmonary disease with (acute) exacerbation: Secondary | ICD-10-CM

## 2022-04-24 DIAGNOSIS — Z21 Asymptomatic human immunodeficiency virus [HIV] infection status: Secondary | ICD-10-CM | POA: Diagnosis not present

## 2022-04-24 DIAGNOSIS — R0789 Other chest pain: Secondary | ICD-10-CM | POA: Diagnosis present

## 2022-04-24 DIAGNOSIS — R0602 Shortness of breath: Secondary | ICD-10-CM | POA: Insufficient documentation

## 2022-04-24 DIAGNOSIS — K5900 Constipation, unspecified: Secondary | ICD-10-CM | POA: Diagnosis not present

## 2022-04-24 LAB — BASIC METABOLIC PANEL
Anion gap: 11 (ref 5–15)
BUN: 7 mg/dL (ref 6–20)
CO2: 28 mmol/L (ref 22–32)
Calcium: 9.5 mg/dL (ref 8.9–10.3)
Chloride: 100 mmol/L (ref 98–111)
Creatinine, Ser: 1.15 mg/dL (ref 0.61–1.24)
GFR, Estimated: >60 mL/min (ref >60)
Glucose, Bld: 239 mg/dL — ABNORMAL HIGH (ref 70–99)
Potassium: 3.7 mmol/L (ref 3.5–5.1)
Sodium: 139 mmol/L (ref 135–145)

## 2022-04-24 LAB — BLOOD GAS, VENOUS
Acid-Base Excess: 8.8 mmol/L — ABNORMAL HIGH (ref 0.0–2.0)
Bicarbonate: 37 mmol/L — ABNORMAL HIGH (ref 20.0–28.0)
O2 Saturation: 86 %
Patient temperature: 37
pCO2, Ven: 67 mmHg — ABNORMAL HIGH (ref 44–60)
pH, Ven: 7.35 (ref 7.25–7.43)
pO2, Ven: 50 mmHg — ABNORMAL HIGH (ref 32–45)

## 2022-04-24 LAB — CBC
HCT: 48 % (ref 39.0–52.0)
Hemoglobin: 16.6 g/dL (ref 13.0–17.0)
MCH: 33.5 pg (ref 26.0–34.0)
MCHC: 34.6 g/dL (ref 30.0–36.0)
MCV: 97 fL (ref 80.0–100.0)
Platelets: 197 10*3/uL (ref 150–400)
RBC: 4.95 MIL/uL (ref 4.22–5.81)
RDW: 12.1 % (ref 11.5–15.5)
WBC: 7.3 10*3/uL (ref 4.0–10.5)
nRBC: 0 % (ref 0.0–0.2)

## 2022-04-24 LAB — TROPONIN I (HIGH SENSITIVITY)
Troponin I (High Sensitivity): 7 ng/L (ref <18)
Troponin I (High Sensitivity): 8 ng/L (ref <18)

## 2022-04-24 MED ORDER — PREGABALIN 100 MG PO CAPS
300.0000 mg | ORAL_CAPSULE | Freq: Two times a day (BID) | ORAL | Status: DC
  Administered 2022-04-24: 300 mg via ORAL
  Filled 2022-04-24: qty 3

## 2022-04-24 MED ORDER — IOHEXOL 350 MG/ML SOLN: 80.0000 mL | Freq: Once | INTRAVENOUS | Status: DC | PRN

## 2022-04-24 MED ORDER — IPRATROPIUM-ALBUTEROL 0.5-2.5 (3) MG/3ML IN SOLN
9.0000 mL | Freq: Once | RESPIRATORY_TRACT | Status: AC
Start: 2022-04-24 — End: 2022-04-24
  Administered 2022-04-24: 9 mL via RESPIRATORY_TRACT
  Filled 2022-04-24: qty 9

## 2022-04-24 MED ORDER — OXYCODONE HCL 5 MG PO TABS
10.0000 mg | ORAL_TABLET | Freq: Four times a day (QID) | ORAL | Status: DC | PRN
  Administered 2022-04-24: 10 mg via ORAL
  Filled 2022-04-24: qty 2

## 2022-04-24 MED ORDER — PREDNISONE 10 MG PO TABS: 40.0000 mg | ORAL_TABLET | Freq: Every day | ORAL | 0 refills | Status: DC

## 2022-04-24 MED ORDER — ALBUTEROL SULFATE (2.5 MG/3ML) 0.083% IN NEBU: 2.5000 mg | INHALATION_SOLUTION | Freq: Four times a day (QID) | RESPIRATORY_TRACT | 2 refills | Status: DC | PRN

## 2022-04-24 MED ORDER — IOHEXOL 350 MG/ML SOLN
80.0000 mL | Freq: Once | INTRAVENOUS | Status: AC | PRN
  Administered 2022-04-24: 80 mL via INTRAVENOUS

## 2022-04-24 MED ORDER — METHYLPREDNISOLONE SODIUM SUCC 125 MG IJ SOLR
125.0000 mg | Freq: Once | INTRAMUSCULAR | Status: AC
  Administered 2022-04-24: 125 mg via INTRAVENOUS
  Filled 2022-04-24: qty 2

## 2022-04-24 NOTE — ED Provider Triage Note (Signed)
Emergency Medicine Provider Triage Evaluation Note  Devin [name] , a 61 y.o. male  was evaluated in triage.  Pt complains of cp. Report pain to R side of chest with SOB and cough ongoing 1 week.  Hx of PE in the past.  No fever  Review of Systems  Positive: As above Negative: As above  Physical Exam  BP 138/86   Pulse 94   Temp 98.3 F (36.8 C)   Resp 20   Ht 6\' 1"  (1.854 m)   Wt 106.6 kg   SpO2 92%   BMI 31.01 kg/m  Gen:   Awake, no distress   Resp:  Normal effort  MSK:   Moves extremities without difficulty  Other:    Medical Decision Making  Medically screening exam initiated at 6:14 PM.  Appropriate orders placed.  Devin [name] was informed that the remainder of the evaluation will be completed by another provider, this initial triage assessment does not replace that evaluation, and the importance of remaining in the ED until their evaluation is complete.     , PA-C 04/24/22 1815

## 2022-04-24 NOTE — ED Notes (Signed)
Patient placed on 2L of 02

## 2022-04-24 NOTE — ED Triage Notes (Signed)
Pt c/o rt sided chest paion for 3-4 days worse today.  He reports he has a history of clots in his lungs previously

## 2022-04-24 NOTE — ED Provider Notes (Signed)
[name] EMERGENCY DEPARTMENT Provider Note  CSN: [id number] Arrival date & time: 04/24/22 1710  Chief Complaint(s) Chest Pain  HPI Devin [name] is a 61 y.o. male with PMH COPD, HIV on Biktarvy, previous PE who has completed Eliquis therapy who presents emergency department for evaluation of chest pain and shortness of breath.  Patient states that he has had worsening chest pain, shortness of breath and cough for last 1 week and his pain localizes to the right chest wall.  He also endorses abdominal distention and constipation.  Denies headache, fever, nausea, vomiting or other systemic symptoms.   Past Medical History Past Medical History:  Diagnosis Date   Anxiety    Arthritis    "I'm eat up w/it" (02/20/2017)   Asthma    Chronic back pain    "the whole back" (02/20/2017)   Chronic bronchitis (HCC)    Complication of anesthesia    "felt like I couldn't breath coming out of it"   COPD (chronic obstructive pulmonary disease) (HCC)    Depression    GERD (gastroesophageal reflux disease)    History of hiatal hernia    History of stomach ulcers    "bleeding ones; I was young then"   HIV infection (HCC) dx'd ~ 1999   Hypertension    Pneumonia    "several times" (02/20/2017)   Prolapsed disk 10/28   Pulmonary embolism (HCC) 02/20/2017   Scoliosis 08/24/13   Patient Active Problem List   Diagnosis Date Noted   Poor dentition requiring referral to dentistry 05/08/2021   S/P spinal fusion 01/23/2018   At risk for obstructive sleep apnea 12/05/2017   Hepatitis B immune 09/10/2017   Spondylolisthesis of lumbar region 06/06/2017   Pulmonary emboli (HCC) 02/20/2017   Pulmonary infarct (HCC) 02/20/2017   Adolescent idiopathic scoliosis of thoracolumbar region 09/14/2015   Dysphagia 01/11/2014   Encounter for screening colonoscopy 01/11/2014   ETOH abuse 12/01/2013   Encounter for long-term (current) use of other medications 04/26/2013   Other long term  (current) drug therapy 04/26/2013   Essential hypertension, benign 04/12/2013   CHEST PAIN 06/12/2010   ABDOMINAL PAIN 06/12/2010   ACUTE SINUSITIS, UNSPECIFIED 11/24/2009   ALLERGIC RHINITIS 02/14/2009   Tobacco dependence syndrome 11/15/2008   ACUTE BRONCHITIS 03/04/2008   DEPRESSION 12/03/2006   LOW BACK PAIN 12/03/2006   Human immunodeficiency virus (HIV) disease (HCC) 11/13/2006   Substance abuse (HCC) 11/13/2006   Chronic obstructive pulmonary disease (HCC) 11/13/2006   GERD 11/13/2006   Recurrent knee instability, 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 Authorizing Provider  albuterol (PROVENTIL HFA;VENTOLIN HFA) 108 (90 BASE) MCG/ACT inhaler Inhale 2 puffs into the lungs every 4 (four) hours as needed for wheezing or shortness of breath.    [provider]  albuterol (PROVENTIL) (2.5 MG/3ML) 0.083% nebulizer solution Take 2.5 mg by nebulization every 6 (six) hours as needed for wheezing or shortness of breath.    [provider]  amitriptyline (ELAVIL) 50 MG tablet Take 50 mg by mouth at bedtime as needed for sleep.  09/12/13   [provider]  calcium carbonate (TUMS EX) 750 MG chewable tablet Chew 2 tablets by mouth as needed for heartburn.    [provider]  cholecalciferol (VITAMIN D3) 25 MCG (1000 UNIT) tablet Take 1,000 Units by mouth daily.    [provider]  COMBIVENT RESPIMAT 20-100 MCG/ACT AERS respimat Inhale 2 puffs into the  lungs every 4 (four) hours as needed. wheezing 12/14/13   [provider]  cyclobenzaprine (FLEXERIL) 10 MG tablet Take 10 mg by mouth 3 (three) times daily as needed for muscle spasms.    [provider]  diazepam (VALIUM) 10 MG tablet Take 10 mg by mouth 3 (three) times daily as needed for anxiety.     [provider]  docusate sodium (COLACE) 100 MG capsule Take 100 mg by mouth daily.    [provider]  elvitegravir-cobicistat-emtricitabine-tenofovir (GENVOYA) 150-150-200-10 MG TABS tablet Take 1 tablet by mouth daily with breakfast. 11/26/21   Campbell Riches, MD  Multiple Vitamins-Minerals (MULTI FOR HIM 50+) TABS Take 1 tablet by mouth daily.    [provider]  oxyCODONE (OXYCONTIN) 15 mg 12 hr tablet Take 1 tablet (15 mg total) by mouth every 12 (twelve) hours. 02/22/17   Reyne Dumas, MD  Oxycodone HCl 10 MG TABS Take 10-20 mg by mouth every 6 (six) hours as needed for pain. 01/03/20   [provider]  pantoprazole (PROTONIX) 40 MG tablet Take 40 mg by mouth daily. 04/27/21   [provider]  pregabalin (LYRICA) 300 MG capsule Take 300 mg by mouth every 12 (twelve) hours. 06/16/20   [provider]                                                                                                                                    Past Surgical History Past Surgical History:  Procedure Laterality Date   BACK SURGERY  2019   KNEE ARTHROSCOPY Right 1980s   Family History Family History  Problem Relation Age of Onset   Diabetes Father    Stroke Other    Colon cancer Neg Hx     Social History Social History   Tobacco Use   Smoking status: Every Day    Packs/day: 1.00    Years: 48.00    Total pack years: 48.00    Types: Cigarettes   Smokeless tobacco: Never  Vaping Use   Vaping Use: Never used  Substance Use Topics   Alcohol use: Not Currently    Comment: last used about 9 years ago   Drug use: Yes    Types: Marijuana    Comment: occ   Allergies Patient has no known allergies.  Review of Systems Review of Systems  Respiratory:  Positive for shortness of breath and wheezing.   Cardiovascular:  Positive for chest pain.    Physical Exam Vital Signs  I have reviewed the triage vital signs BP 137/85   Pulse 87   Temp 98.3 F (36.8 C)   Resp 16   Ht 6\' 1"  (1.854 m)   Wt 106.6 kg   SpO2 (!) 87%   BMI 31.01 kg/m    Physical Exam Constitutional:      General: He is not in acute distress.    Appearance: Normal appearance.  HENT:     Head: Normocephalic and atraumatic.     Nose: No congestion or rhinorrhea.  Eyes:     General:        Right eye: No discharge.        Left eye: No discharge.     Extraocular Movements: Extraocular movements intact.     Pupils: Pupils are equal, round, and reactive to light.  Cardiovascular:     Rate and Rhythm: Normal rate and regular rhythm.     Heart sounds: No murmur heard. Pulmonary:     Effort: Tachypnea and respiratory distress present.     Breath sounds: Wheezing present. No rales.  Abdominal:     General: There is no distension.     Tenderness: There is no abdominal tenderness.  Musculoskeletal:        General: Normal range of motion.     Cervical back: Normal range of motion.  Skin:    General: Skin is warm and dry.  Neurological:     General: No focal deficit present.     Mental Status: He is alert.     ED Results and Treatments Labs (all labs ordered are listed, but only abnormal results are displayed) Labs Reviewed  BASIC METABOLIC PANEL - Abnormal; Notable for the following components:      Result Value   Glucose, Bld 239 (*)    All other components within normal limits  CBC  TROPONIN I (HIGH SENSITIVITY)  TROPONIN I (HIGH SENSITIVITY)                                                                                                                          Radiology DG Chest 1 View  Result Date: 04/24/2022 CLINICAL DATA:  Chest pain EXAM: CHEST  1 VIEW COMPARISON:  01/28/2021 FINDINGS: Horizontal linear densities in the left lateral lung base unchanged compatible with scarring. Right lung clear Heart size and vascularity normal. Negative for heart failure. Negative for pneumonia or effusion. IMPRESSION: No active disease. Electronically Signed   By: Franchot Gallo M.D.   On: 04/24/2022 19:03    Pertinent labs & imaging results that  were available during my care of the patient were reviewed by me and considered in my medical decision making (see MDM for details).  Medications Ordered in ED Medications - No data to display  Procedures .Critical Care  Performed by: Glendora Score, MD Authorized by: Glendora Score, MD   Critical care provider statement:    Critical care time (minutes):  30   Critical care was necessary to treat or prevent imminent or life-threatening deterioration of the following conditions:  Respiratory failure   Critical care was time spent personally by me on the following activities:  Development of treatment plan with patient or surrogate, discussions with consultants, evaluation of patient's response to treatment, examination of patient, ordering and review of laboratory studies, ordering and review of radiographic studies, ordering and performing treatments and interventions, pulse oximetry, re-evaluation of patient's condition and review of old charts   (including critical care time)  Medical Decision Making / ED Course   This patient presents to the ED for concern of chest pain, shortness of breath, this involves an extensive number of treatment options, and is a complaint that carries with it a high risk of complications and morbidity.  The differential diagnosis includes COPD exacerbation, pneumonia, costochondritis, PE  MDM: Patient seen emergency room for evaluation of chest pain shortness of breath.  Physical exam reveals significant wheezing and decreased air movement with tenderness along the right chest wall.  Physical exam also with some mild abdominal distention but no tenderness.  Laboratory evaluation largely unremarkable and pH is 7.35 with a PCO2 of 67 but a bicarb of 37 and this likely represents chronic hypercarbia from his COPD.  He was given 3 DuoNebs  and methylprednisolone and on reevaluation his symptoms have dramatically improved.  A CT PE and a CT abdomen pelvis was obtained to rule out pulmonary embolism in the setting of his previous PE and chest pain as well as evaluate his stool burden given large stool burden on his last CT.  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  CT abdomen pelvis unremarkable with small to moderate stool burden.  Patient informed he will need to continue his MiraLAX regimen at home.  His wheezing is minimal on reevaluation and I offered admission to the patient but he is requesting be discharged which I do not think is unreasonable at this time.  Using shared decision making, we agreed to discharge patient with outpatient follow-up and I refilled his nebulizer and he will be discharged on 4 days of steroids.  Patient to discharge.  Of note, patient arrived with hypoxia down to 87%, but after DuoNeb therapy, patient maintaining oxygen saturations greater than 90% on room air.   Additional history obtained: -Additional history obtained from wife -External records from outside source obtained and reviewed including: Chart review including previous notes, labs, imaging, consultation notes   Lab Tests: -I ordered, reviewed, and interpreted labs.   The pertinent results include:   Labs Reviewed  BASIC METABOLIC PANEL - Abnormal; Notable for the following components:      Result Value   Glucose, Bld 239 (*)    All other components within normal limits  CBC  TROPONIN I (HIGH SENSITIVITY)  TROPONIN I (HIGH SENSITIVITY)      EKG   EKG Interpretation  Date/Time:  Wednesday April 24 2022 18:30:55 EDT Ventricular Rate:  95 PR Interval:  160 QRS Duration: 100 QT Interval:  366 QTC Calculation: 459 R Axis:   67 Text Interpretation: Normal sinus rhythm When compared with ECG of 28-Jan-2021 23:13, PREVIOUS ECG IS PRESENT Confirmed by Kyriana Yankee ([id number]) on  04/24/2022 11:16:17 PM  Imaging Studies ordered: I ordered imaging studies including CT abdomen pelvis, CT PE I independently visualized and interpreted imaging. I agree with the radiologist interpretation   Medicines ordered and prescription drug management: No orders of the defined types were placed in this encounter.   -I have reviewed the patients home medicines and have made adjustments as needed  Critical interventions Multiple DuoNebs, oxygen supplementation, steroids    Cardiac Monitoring: The patient was maintained on a cardiac monitor.  I personally viewed and interpreted the cardiac monitored which showed an underlying rhythm of: NSR  Social Determinants of Health:  Factors impacting patients care include: none   Reevaluation: After the interventions noted above, I reevaluated the patient and found that they have :improved  Co morbidities that complicate the patient evaluation  Past Medical History:  Diagnosis Date   Anxiety    Arthritis    "I'm eat up w/it" (02/20/2017)   Asthma    Chronic back pain    "the whole back" (02/20/2017)   Chronic bronchitis ([place])    Complication of anesthesia    "felt like I couldn't breath coming out of it"   COPD (chronic obstructive pulmonary disease) ([place])    Depression    GERD (gastroesophageal reflux disease)    History of hiatal hernia    History of stomach ulcers    "bleeding ones; I was young then"   HIV infection ([place]) dx'd ~ 1999   Hypertension    Pneumonia    "several times" (02/20/2017)   Prolapsed disk 10/28   Pulmonary embolism ([place]) 02/20/2017   Scoliosis 08/24/13      Dispostion: I considered admission for this patient, and I did offer admission of the patient for DuoNeb therapy and observation, but he declined and is requesting to go home which is not unreasonable given his significant improvement.  Patient then discharged with close outpatient follow-up, steroids and home  nebulizers.     Final Clinical Impression(s) / ED Diagnoses Final diagnoses:  None     @PCDICTATION @    Teressa Lower, MD 04/24/22 2318

## 2022-04-24 NOTE — ED Notes (Signed)
Patient transported to CT 

## 2022-04-25 ENCOUNTER — Other Ambulatory Visit (HOSPITAL_COMMUNITY): Payer: Self-pay

## 2022-04-29 ENCOUNTER — Other Ambulatory Visit (HOSPITAL_COMMUNITY): Payer: Self-pay

## 2022-05-01 ENCOUNTER — Encounter: Payer: Self-pay | Admitting: Internal Medicine

## 2022-05-20 ENCOUNTER — Encounter: Payer: Self-pay | Admitting: Internal Medicine

## 2022-05-27 ENCOUNTER — Ambulatory Visit: Payer: Medicare Other | Admitting: Gastroenterology

## 2022-05-29 ENCOUNTER — Ambulatory Visit (INDEPENDENT_AMBULATORY_CARE_PROVIDER_SITE_OTHER): Payer: Medicare Other | Admitting: Internal Medicine

## 2022-05-29 ENCOUNTER — Encounter: Payer: Self-pay | Admitting: Internal Medicine

## 2022-05-29 DIAGNOSIS — J449 Chronic obstructive pulmonary disease, unspecified: Secondary | ICD-10-CM

## 2022-05-29 DIAGNOSIS — F17210 Nicotine dependence, cigarettes, uncomplicated: Secondary | ICD-10-CM

## 2022-05-29 DIAGNOSIS — J4489 Other specified chronic obstructive pulmonary disease: Secondary | ICD-10-CM | POA: Insufficient documentation

## 2022-05-29 MED ORDER — BUDESONIDE-FORMOTEROL FUMARATE 80-4.5 MCG/ACT IN AERO: INHALATION_SPRAY | RESPIRATORY_TRACT | 12 refills | Status: DC

## 2022-05-29 NOTE — Progress Notes (Signed)
History of Present Illness: 61yowm active smoker with onset doe around  2007   July 18, 2010  1st pulmonary office eval doe x 4 years worse x 6 months to point where fast pace  x maybe a block sob no better with combivent assoc with congested cough worse in am's with mucoid sputum and mild heartburn.        Current Medications (verified): 1)  Diazepam 10 Mg Tabs (Diazepam) .... Take 1 Tablet By Mouth Three Times A Day 2)  Combivent 103-18 Mcg/act Aero (Albuterol-Ipratropium) .... 2 Puffs Every 4-6 Hrs As Needed 3)  Atripla 600-200-300 Mg Tabs (Efavirenz-Emtricitab-Tenofovir) .... Take 1 Tablet By Mouth At Bedtime 4)  Hydrocodone-Acetaminophen 10-325 Mg Tabs (Hydrocodone-Acetaminophen) .... One Tablet Three Times A Day 5)  Flexeril 10 Mg Tabs (Cyclobenzaprine Hcl) .... As Directed 6)  Protonix 40 Mg Tbec (Pantoprazole Sodium) .... Take 1 Tablet By Mouth Once A Day       Past Medical History: COPD..........................................................Marland KitchenWert     - HFA 75% p coaching  HIV disease dx 19...................................Marland KitchenVolmer  GERD Depression Low back pain      CXR   date:  06/13/2010  Findings:      no acute findings   Impression & Recommendations:   Problem # 1:  DYSPNEA (ICD-786.05)     DDX of  difficult airways managment all start with A and  include Adherence, Ace Inhibitors, Acid Reflux, Active Sinus Disease, Alpha 1 Antitripsin deficiency, Anxiety masquerading as Airways dz,  ABPA,  allergy(esp in young), Aspiration (esp in elderly), Adverse effects of DPI,  Active smokers, plus one B  = Beta blocker use.Marland Kitchen     Active smoking:  see #2   Adherence: I spent extra time with the patient today explaining optimal mdi  technique.  This improved from  25-75% with coaching   ? acid reflux:  See instructions for specific recommendations    Orders: New Patient Level V [id number])   Problem # 2:  TOBACCO ABUSE (ICD-305.1)   The following medications  were removed from the medication list:    Nicoderm Cq 21 Mg/24hr Pt24 (Nicotine) .Marland Kitchen... Apply one patch once daily    I emphasized that although we never turn away smokers from the pulmonary clinic, we do ask that they understand that the recommendations that were made won't work nearly as well in the presence of continued cigarette exposure and we may reach a point where we can't help the patient if he/she can't quit smoking.     Orders: New Patient Level V [id number])   Medications Added to Medication List This Visit: 1)  Protonix 40 Mg Tbec (Pantoprazole sodium) .... Take one 30-60 min before first and last meals of the day 2)  Symbicort 160-4.5 Mcg/act Aero (Budesonide-formoterol fumarate) .... 2 puffs first thing  in am and 2 puffs again in pm about 12 hours later 3)  Combivent 103-18 Mcg/act Aero (Albuterol-ipratropium) .... 2 puffs every 4-6 hrs as needed   Patient Instructions: 1)  Protonix 40 mg Take one 30-60 min before first and last meals of the day  2)  Symbicort 160 2 puffs first thing  in am and 2 puffs again in pm about 12 hours later 3)  Work on inhaler technique:  relax and blow all the way out then take a nice smooth deep breath back in, triggering the inhaler at same time you start breathing in hold a few seconds 4)  Continue combivent 2 puffs ever 4 hours if  needed 5)  GERD  diet  8)  Please schedule a follow-up appointment in 4 weeks, sooner if needed  with PFT's> never done  rx SYMBICORT 160-4.5 MCG/ACT  AERO (BUDESONIDE-FORMOTEROL FUMARATE) 2 puffs first thing  in am and 2 puffs again in pm about 12 hours later  #1 x 11      Devin [name], male    DOB: 1961-05-22    MRN: [id number]   Brief patient profile:  64  yowm  active smoker  referred back to pulmonary clinic in [place]  05/29/2022 by Gareth Morgan  for copd re-eval      History of Present Illness  05/29/2022  Pulmonary/ 1st office eval/ [facility]adde[facility] / [facility]  Chief Complaint  Patient presents with    Consult    Ref for COPD. Suspected to have OSA.   Dyspnea:  room to room limited also by hips/back/ knees Cough: congestion/ mucoid sputum Sleep: 30 degrees HOB with pillows  SABA use: hfa and neb qid  No obvious day to day or daytime pattern/variability or assoc excess/ purulent sputum or mucus plugs or hemoptysis or cp or chest tightness, subjective wheeze or overt sinus or hb symptoms.    Also denies any obvious fluctuation of symptoms with weather or environmental changes or other aggravating or alleviating factors except as outlined above   No unusual exposure hx or h/o childhood pna/ asthma or knowledge of premature birth.  Current Allergies, Complete Past Medical History, Past Surgical History, Family History, and Social History were reviewed in [employer] record.  ROS  The following are not active complaints unless bolded Hoarseness, sore throat, dysphagia, dental problems, itching, sneezing,  nasal congestion or discharge of excess mucus or purulent secretions, ear ache,   fever, chills, sweats, unintended wt loss or wt gain, classically pleuritic or exertional cp,  orthopnea pnd or arm/hand swelling  or leg swelling, presyncope, palpitations, abdominal pain, anorexia, nausea, vomiting, diarrhea  or change in bowel habits or change in bladder habits, change in stools or change in urine, dysuria, hematuria,  rash, arthralgias, visual complaints, headache, numbness, weakness or ataxia or problems with walking or coordination,  change in mood or  memory.           Past Medical History:  Diagnosis Date   Anxiety    Arthritis    "I'm eat up w/it" (02/20/2017)   Asthma    Chronic back pain    "the whole back" (02/20/2017)   Chronic bronchitis (HCC)    Complication of anesthesia    "felt like I couldn't breath coming out of it"   COPD (chronic obstructive pulmonary disease) (HCC)    Depression    GERD (gastroesophageal reflux disease)    History of hiatal  hernia    History of stomach ulcers    "bleeding ones; I was young then"   HIV infection (HCC) dx'd ~ 1999   Hypertension    Pneumonia    "several times" (02/20/2017)   Prolapsed disk 10/28   Pulmonary embolism (HCC) 02/20/2017   Scoliosis 08/24/13    Outpatient Medications Prior to Visit - - NOTE:   Unable to verify as accurately reflecting what pt takes    Medication Sig Dispense Refill   albuterol (PROVENTIL HFA;VENTOLIN HFA) 108 (90 BASE) MCG/ACT inhaler Inhale 2 puffs into the lungs every 4 (four) hours as needed for wheezing or shortness of breath.     albuterol (PROVENTIL) (2.5 MG/3ML) 0.083% nebulizer solution Take 3 mLs (2.5  mg total) by nebulization every 6 (six) hours as needed for wheezing or shortness of breath. 75 mL 2   amitriptyline (ELAVIL) 50 MG tablet Take 50 mg by mouth at bedtime as needed for sleep.      calcium carbonate (TUMS EX) 750 MG chewable tablet Chew 2 tablets by mouth as needed for heartburn.     cholecalciferol (VITAMIN D3) 25 MCG (1000 UNIT) tablet Take 1,000 Units by mouth daily.     COMBIVENT RESPIMAT 20-100 MCG/ACT AERS respimat Inhale 2 puffs into the lungs every 4 (four) hours as needed. wheezing     cyclobenzaprine (FLEXERIL) 10 MG tablet Take 10 mg by mouth 3 (three) times daily as needed for muscle spasms.     diazepam (VALIUM) 10 MG tablet Take 10 mg by mouth 3 (three) times daily as needed for anxiety.      docusate sodium (COLACE) 100 MG capsule Take 100 mg by mouth daily.     elvitegravir-cobicistat-emtricitabine-tenofovir (GENVOYA) 150-150-200-10 MG TABS tablet Take 1 tablet by mouth daily with breakfast. 30 tablet 5   Multiple Vitamins-Minerals (MULTI FOR HIM 50+) TABS Take 1 tablet by mouth daily.     oxyCODONE (OXYCONTIN) 15 mg 12 hr tablet Take 1 tablet (15 mg total) by mouth every 12 (twelve) hours. 14 tablet 0   Oxycodone HCl 10 MG TABS Take 10-20 mg by mouth every 6 (six) hours as needed for pain.     pantoprazole (PROTONIX) 40 MG tablet  Take 40 mg by mouth daily.     predniSONE (DELTASONE) 10 MG tablet Take 4 tablets (40 mg total) by mouth daily. 15 tablet 0   pregabalin (LYRICA) 300 MG capsule Take 300 mg by mouth every 12 (twelve) hours.     No facility-administered medications prior to visit.     Objective:     BP 136/88 (BP Location: Left Arm, Patient Position: Sitting)   Pulse 92   Temp 98.9 F (37.2 C) (Temporal)   Ht 6\' 1"  (1.854 m)   Wt 230 lb 9.6 oz (104.6 kg)   SpO2 92% Comment: ra  BMI 30.42 kg/m   SpO2: 92 % (ra)  Disheveled amb wm / congested sounding cough   HEENT : Oropharynx  clear   Nasal turbinates nl    NECK :  without  apparent JVD/ palpable Nodes/TM    LUNGS: no acc muscle use,  Mild barrel  contour chest wall with bilateral  insp/exp coarse rhonchi  and  without cough on insp or exp maneuvers  and mild  Hyperresonant  to  percussion bilaterally     CV:  RRR  no s3 or murmur or increase in P2, and no edema   ABD:  soft and nontender with pos end  insp Hoover's  in the supine position.  No bruits or organomegaly appreciated   MS:  Nl gait/ ext warm without deformities Or obvious joint restrictions  calf tenderness, cyanosis or clubbing     SKIN: warm and dry without lesions    NEURO:  alert, approp, nl sensorium with  no motor or cerebellar deficits apparent.     I personally reviewed images and agree with radiology impression as follows:   Chest CTa  04/24/22  1. No pulmonary embolism. 2. Tracheobronchomalacia. Superimposed airway inflammation. 3. No acute intra-abdominal pathology identified. No definite radiographic explanation for the patient's reported symptoms. 4. Mild hepatic steatosis. 5. Small to moderate volume stool throughout the colon with relatively small volume stool within the rectal vault.  Assessment   Asthmatic bronchitis , chronic (HCC) Active smoker with doe across the room  - 05/29/2022  After extensive coaching inhaler device,  effectiveness =     75% from a baseline of 25% so try breztri samples then rx with symb 80 2bid given HIV pos status and return for pfts to add spiriva at point or whatever his insurance combo/pfts indicate best options   Group D (now reclassified as E) in terms of symptom/risk and laba/lama/ICS  therefore appropriate rx at this point >>>  breztri x 2 week samples then symb 80 2bid pending pfts with more approp saba use than he presently employs using ABC format action plan  Re SABA :  I spent extra time with pt today reviewing appropriate use of albuterol for prn use on exertion with the following points: 1) saba is for relief of sob that does not improve by walking a slower pace or resting but rather if the pt does not improve after trying this first. 2) If the pt is convinced, as many are, that saba helps recover from activity faster then it's easy to tell if this is the case by re-challenging : ie stop, take the inhaler, then p 5 minutes try the exact same activity (intensity of workload) that just caused the symptoms and see if they are substantially diminished or not after saba 3) if there is an activity that reproducibly causes the symptoms, try the saba 15 min before the activity on alternate days   If in fact the saba really does help, then fine to continue to use it prn but advised may need to look closer at the maintenance regimen being used to achieve better control of airways disease with exertion.        Cigarette smoker Counseled re importance of smoking cessation but did not meet time criteria for separate billing  F/u 4 weeks with all meds in hand using a trust but verify approach to confirm accurate Medication  Reconciliation The principal here is that until we are certain that the  patients are doing what we've asked, it makes no sense to ask them to do more.            Each maintenance medication was reviewed in detail including emphasizing most importantly the difference between maintenance  and prns and under what circumstances the prns are to be triggered using an action plan format where appropriate.  Total time for H and P, chart review, counseling, reviewing hfa/neb device(s) and generating customized AVS unique to this office visit / same day charting > 45 min with pt not seen in over 10 years           Sandrea Hughs, MD 05/29/2022

## 2022-05-29 NOTE — Assessment & Plan Note (Addendum)
Counseled re importance of smoking cessation but did not meet time criteria for separate billing  F/u 4 weeks with all meds in hand using a trust but verify approach to confirm accurate Medication  Reconciliation The principal here is that until we are certain that the  patients are doing what we've asked, it makes no sense to ask them to do more.            Each maintenance medication was reviewed in detail including emphasizing most importantly the difference between maintenance and prns and under what circumstances the prns are to be triggered using an action plan format where appropriate.  Total time for H and P, chart review, counseling, reviewing hfa/neb device(s) and generating customized AVS unique to this office visit / same day charting > 45 min with pt not seen in over 10 years

## 2022-05-29 NOTE — Patient Instructions (Addendum)
Plan A = Automatic = Always=    Breztri (symbicort) Take 2 puffs first thing in am and then another 2 puffs about 12 hours later   Work on inhaler technique:  relax and gently blow all the way out then take a nice smooth full deep breath back in, triggering the inhaler at same time you start breathing in.  Hold breath in for at least  5 seconds if you can. Blow out thru nose. Rinse and gargle with water when done.  If mouth or throat bother you at all,  try brushing teeth/gums/tongue with arm and hammer toothpaste/ make a slurry and gargle and spit out.  - keep the empty inhaler handy to practice with     Plan B = Backup (to supplement plan A, not to replace it) Only use your albuterol(or combivent)  inhaler as a rescue medication to be used if you can't catch your breath by resting or doing a relaxed purse lip breathing pattern.  - The less you use it, the better it will work when you need it. - Ok to use the inhaler up to 2 puffs  every 4 hours if you must but call for appointment if use goes up over your usual need - Don't leave home without it !!  (think of it like the spare tire for your car)   Plan C = Crisis (instead of Plan B but only if Plan B stops working) - only use your albuterol nebulizer if you first try Plan B and it fails to help > ok to use the nebulizer up to every 4 hours but if start needing it regularly call for immediate appointment   Please schedule a follow up office visit in 4 weeks, sooner if needed  with all medications /inhalers/ solutions in hand so we can verify exactly what you are taking. This includes all medications from all doctors and over the counters

## 2022-05-29 NOTE — Assessment & Plan Note (Signed)
Active smoker with doe across the room  - 05/29/2022  After extensive coaching inhaler device,  effectiveness =    75% from a baseline of 25% so try breztri samples then rx with symb 80 2bid given HIV pos status and return for pfts to add spiriva at point or whatever his insurance combo/pfts indicate best options   Group D (now reclassified as E) in terms of symptom/risk and laba/lama/ICS  therefore appropriate rx at this point >>>  breztri x 2 week samples then symb 80 2bid pending pfts with more approp saba use than he presently employs using ABC format action plan  Re SABA :  I spent extra time with pt today reviewing appropriate use of albuterol for prn use on exertion with the following points: 1) saba is for relief of sob that does not improve by walking a slower pace or resting but rather if the pt does not improve after trying this first. 2) If the pt is convinced, as many are, that saba helps recover from activity faster then it's easy to tell if this is the case by re-challenging : ie stop, take the inhaler, then p 5 minutes try the exact same activity (intensity of workload) that just caused the symptoms and see if they are substantially diminished or not after saba 3) if there is an activity that reproducibly causes the symptoms, try the saba 15 min before the activity on alternate days   If in fact the saba really does help, then fine to continue to use it prn but advised may need to look closer at the maintenance regimen being used to achieve better control of airways disease with exertion.

## 2022-06-10 ENCOUNTER — Other Ambulatory Visit (HOSPITAL_COMMUNITY): Payer: Self-pay

## 2022-06-12 ENCOUNTER — Other Ambulatory Visit: Payer: Self-pay | Admitting: Orthopedic Surgery

## 2022-06-12 ENCOUNTER — Other Ambulatory Visit (HOSPITAL_COMMUNITY): Payer: Self-pay | Admitting: Orthopedic Surgery

## 2022-06-12 DIAGNOSIS — S83249A Other tear of medial meniscus, current injury, unspecified knee, initial encounter: Secondary | ICD-10-CM

## 2022-06-18 ENCOUNTER — Ambulatory Visit: Payer: Medicare Other | Admitting: Gastroenterology

## 2022-06-24 ENCOUNTER — Ambulatory Visit (HOSPITAL_COMMUNITY): Payer: Medicare Other

## 2022-06-26 ENCOUNTER — Ambulatory Visit (HOSPITAL_COMMUNITY)
Admission: RE | Admit: 2022-06-26 | Discharge: 2022-06-26 | Disposition: A | Discharge status: Home / Self Care | Payer: Medicare Other | Source: Ambulatory Visit | Attending: Orthopedic Surgery | Admitting: Orthopedic Surgery

## 2022-06-26 DIAGNOSIS — S83249A Other tear of medial meniscus, current injury, unspecified knee, initial encounter: Secondary | ICD-10-CM | POA: Insufficient documentation

## 2022-06-28 ENCOUNTER — Other Ambulatory Visit (HOSPITAL_COMMUNITY): Payer: Self-pay

## 2022-07-03 ENCOUNTER — Telehealth: Payer: Self-pay | Admitting: Internal Medicine

## 2022-07-03 ENCOUNTER — Other Ambulatory Visit (HOSPITAL_COMMUNITY): Payer: Self-pay

## 2022-07-03 DIAGNOSIS — J449 Chronic obstructive pulmonary disease, unspecified: Secondary | ICD-10-CM

## 2022-07-03 DIAGNOSIS — R0609 Other forms of dyspnea: Secondary | ICD-10-CM

## 2022-07-03 NOTE — Telephone Encounter (Signed)
First Will need to complete the recs on last avs which include pfts and f/u ov which may be faster if schedule for [place] office but must bring all active meds with him

## 2022-07-03 NOTE — Telephone Encounter (Signed)
Fax received from Dr. Margarita Rana with [facility] to perform a Right Knee Scope on patient.  Patient needs surgery clearance. Surgery is Pending. Patient was seen on 05/29/2022. Office protocol is a risk assessment can be sent to surgeon if patient has been seen in 60 days or less.   Sending to Dr Sherene Sires for risk assessment or recommendations if patient needs to be seen in office prior to surgical procedure.

## 2022-07-05 NOTE — Telephone Encounter (Signed)
ATC patient- unable to leave vm due to mailbox being full.   

## 2022-07-10 ENCOUNTER — Ambulatory Visit: Payer: Medicare Other | Admitting: Internal Medicine

## 2022-07-10 ENCOUNTER — Telehealth: Payer: Self-pay | Admitting: Internal Medicine

## 2022-07-10 MED ORDER — BREZTRI AEROSPHERE 160-9-4.8 MCG/ACT IN AERO: 2.0000 | INHALATION_SPRAY | Freq: Two times a day (BID) | RESPIRATORY_TRACT | 3 refills | Status: DC

## 2022-07-10 NOTE — Telephone Encounter (Signed)
I called and spoke with the wife and she did verify that he is suppose to be on Breztri and I have sent in a refill to the pharmacy.   The wife is being told that her spouse will have to wait until October or November to get a breathing test either at [facility] or [facility].   The wife car is broke down at this time and they only have a truck that sucks gas and neither one are able to get out well at this time. Can you please talk to the wife? She wants to talk about a [facility] testing for the [place] office. They live in [place] and its too far for them to drive due to gas prices.   Thanks

## 2022-07-10 NOTE — Telephone Encounter (Signed)
I called and spoke with the wife and she reports that he has not had his wife and he has not had PFT's at this time. The wife wants to know if he should cancel today and re-schedule after his PFT. Please advise.

## 2022-07-10 NOTE — Progress Notes (Deleted)
History of Present Illness: 61yowm active smoker with onset doe around  2007   July 18, 2010  1st pulmonary [name] eval doe x 4 years worse x 6 months to point where fast pace  x maybe a block sob no better with combivent assoc with congested cough worse in am's with mucoid sputum and mild heartburn.        Current Medications (verified): 1)  Diazepam 10 Mg Tabs (Diazepam) .... Take 1 Tablet By Mouth Three Times A Day 2)  Combivent 103-18 Mcg/act Aero (Albuterol-Ipratropium) .... 2 Puffs Every 4-6 Hrs As Needed 3)  Atripla 600-200-300 Mg Tabs (Efavirenz-Emtricitab-Tenofovir) .... Take 1 Tablet By Mouth At Bedtime 4)  Hydrocodone-Acetaminophen 10-325 Mg Tabs (Hydrocodone-Acetaminophen) .... One Tablet Three Times A Day 5)  Flexeril 10 Mg Tabs (Cyclobenzaprine Hcl) .... As Directed 6)  Protonix 40 Mg Tbec (Pantoprazole Sodium) .... Take 1 Tablet By Mouth Once A Day       Past Medical History: COPD..........................................................Marland KitchenWert     - HFA 75% p coaching  HIV disease dx 19...................................Marland KitchenVolmer  GERD Depression Low back pain      CXR   date:  06/13/2010  Findings:      no acute findings   Impression & Recommendations:   Problem # 1:  DYSPNEA (ICD-786.05)     DDX of  difficult airways managment all start with A and  include Adherence, Ace Inhibitors, Acid Reflux, Active Sinus Disease, Alpha 1 Antitripsin deficiency, Anxiety masquerading as Airways dz,  ABPA,  allergy(esp in young), Aspiration (esp in elderly), Adverse effects of DPI,  Active smokers, plus one B  = Beta blocker use.Marland Kitchen     Active smoking:  see #2   Adherence: I spent extra time with the patient today explaining optimal mdi  technique.  This improved from  25-75% with coaching   ? acid reflux:  See instructions for specific recommendations    Orders: New Patient Level V [id number]   Problem # 2:  TOBACCO ABUSE (ICD-305.1)   The following medications  were removed from the medication list:    Nicoderm Cq 21 Mg/24hr Pt24 (Nicotine) .Marland Kitchen... Apply one patch once daily    I emphasized that although we never turn away smokers from the pulmonary clinic, we do ask that they understand that the recommendations that were made won't work nearly as well in the presence of continued cigarette exposure and we may reach a point where we can't help the patient if he/she can't quit smoking.     Orders: New Patient Level V [id number])   Medications Added to Medication List This Visit: 1)  Protonix 40 Mg Tbec (Pantoprazole sodium) .... Take one 30-60 min before first and last meals of the day 2)  Symbicort 160-4.5 Mcg/act Aero (Budesonide-formoterol fumarate) .... 2 puffs first thing  in am and 2 puffs again in pm about 12 hours later 3)  Combivent 103-18 Mcg/act Aero (Albuterol-ipratropium) .... 2 puffs every 4-6 hrs as needed   Patient Instructions: 1)  Protonix 40 mg Take one 30-60 min before first and last meals of the day  2)  Symbicort 160 2 puffs first thing  in am and 2 puffs again in pm about 12 hours later 3)  Work on inhaler technique:  relax and blow all the way out then take a nice smooth deep breath back in, triggering the inhaler at same time you start breathing in hold a few seconds 4)  Continue combivent 2 puffs ever 4 hours if  needed 5)  GERD  diet  8)  Please schedule a follow-up appointment in 4 weeks, sooner if needed  with PFT's> never done  rx SYMBICORT 160-4.5 MCG/ACT  AERO (BUDESONIDE-FORMOTEROL FUMARATE) 2 puffs first thing  in am and 2 puffs again in pm about 12 hours later  #1 x 11      Devin [name], male    DOB: Oct 07, 1961    MRN: [id number]   Brief patient profile:  29  yowm  active smoker  referred back to pulmonary clinic in [place]  05/29/2022 by Devin [name]  for copd re-eval      History of Present Illness  05/29/2022  Pulmonary/ 1st [name] eval/ Devin [name] / [name]  Chief Complaint  Patient presents with    Consult    Ref for COPD. Suspected to have OSA.   Dyspnea:  room to room limited also by hips/back/ knees Cough: congestion/ mucoid sputum Sleep: 30 degrees HOB with pillows  SABA use: hfa and neb qid Rec Plan A = Automatic = Always=    Breztri (symbicort) Take 2 puffs first thing in am and then another 2 puffs about 12 hours later Work on inhaler technique:    Plan B = Backup (to supplement plan A, not to replace it) Only use your albuterol(or combivent)  inhaler as a rescue medication  Plan C = Crisis (instead of Plan B but only if Plan B stops working) - only use your albuterol nebulizer if you first try Plan B  Please schedule a follow up [name] visit in 4 weeks, sooner if needed  with all medications /inhalers/ solutions in hand     07/10/2022  f/u ov/Devin [name]/Devin [name] re: *** maint on ***  No chief complaint on file.   Dyspnea:  *** Cough: *** Sleeping: *** SABA use: *** 02: *** Covid status: *** Lung cancer screening: ***   No obvious day to day or daytime variability or assoc excess/ purulent sputum or mucus plugs or hemoptysis or cp or chest tightness, subjective wheeze or overt sinus or hb symptoms.   *** without nocturnal  or early am exacerbation  of respiratory  c/o's or need for noct saba. Also denies any obvious fluctuation of symptoms with weather or environmental changes or other aggravating or alleviating factors except as outlined above   No unusual exposure hx or h/o childhood pna/ asthma or knowledge of premature birth.  Current Allergies, Complete Past Medical History, Past Surgical History, Family History, and Social History were reviewed in [employer] record.  ROS  The following are not active complaints unless bolded Hoarseness, sore throat, dysphagia, dental problems, itching, sneezing,  nasal congestion or discharge of excess mucus or purulent secretions, ear ache,   fever, chills, sweats, unintended wt loss or wt gain,  classically pleuritic or exertional cp,  orthopnea pnd or arm/hand swelling  or leg swelling, presyncope, palpitations, abdominal pain, anorexia, nausea, vomiting, diarrhea  or change in bowel habits or change in bladder habits, change in stools or change in urine, dysuria, hematuria,  rash, arthralgias, visual complaints, headache, numbness, weakness or ataxia or problems with walking or coordination,  change in mood or  memory.        No outpatient medications have been marked as taking for the 07/10/22 encounter (Appointment) with Nyoka Cowden, MD.             Past Medical History:  Diagnosis Date   Anxiety    Arthritis    "I'm  eat up w/it" (02/20/2017)   Asthma    Chronic back pain    "the whole back" (02/20/2017)   Chronic bronchitis (HCC)    Complication of anesthesia    "felt like I couldn't breath coming out of it"   COPD (chronic obstructive pulmonary disease) (HCC)    Depression    GERD (gastroesophageal reflux disease)    History of hiatal hernia    History of stomach ulcers    "bleeding ones; I was young then"   HIV infection (HCC) dx'd ~ 1999   Hypertension    Pneumonia    "several times" (02/20/2017)   Prolapsed disk 10/28   Pulmonary embolism (HCC) 02/20/2017   Scoliosis 08/24/13        Objective:    Wt Readings from Last 3 Encounters:  05/29/22 230 lb 9.6 oz (104.6 kg)  04/24/22 235 lb 0.2 oz (106.6 kg)  04/04/22 235 lb (106.6 kg)      Vital signs reviewed  07/10/2022  - Note at rest 02 sats  ***% on ***   General appearance:    ***       Mild bar***   I personally reviewed images and agree with radiology impression as follows:   Chest CTa  04/24/22  1. No pulmonary embolism. 2. Tracheobronchomalacia. Superimposed airway inflammation. 3. No acute intra-abdominal pathology identified. No definite radiographic explanation for the patient's reported symptoms. 4. Mild hepatic steatosis. 5. Small to moderate volume stool throughout the colon with  relatively small volume stool within the rectal vault.    Assessment

## 2022-07-10 NOTE — Telephone Encounter (Signed)
The wife will call and make a follow up for another day for the PFT and the office visit.

## 2022-07-10 NOTE — Telephone Encounter (Signed)
The plan was ideally to get  pfts which I could have done in the [place] office - if we have an opening next week there then change to [place] and I'll do spirometry in the office.   If nothing available and surgery is more urgent, just keep appt today and I can clear him s pfts

## 2022-07-29 NOTE — Telephone Encounter (Signed)
Called and wife states she will call back when she and patient knows schedule better for full pfts and follow up with Dr Melvyn Novas. Advised her to call office back when they are ready to move forward with surgical clearance. Nothing further needed

## 2022-07-30 ENCOUNTER — Other Ambulatory Visit: Payer: Self-pay | Admitting: Infectious Diseases

## 2022-07-30 ENCOUNTER — Ambulatory Visit (HOSPITAL_COMMUNITY): Payer: Medicare Other | Attending: Internal Medicine

## 2022-07-30 ENCOUNTER — Other Ambulatory Visit (HOSPITAL_COMMUNITY): Payer: Self-pay

## 2022-07-30 DIAGNOSIS — B20 Human immunodeficiency virus [HIV] disease: Secondary | ICD-10-CM

## 2022-07-31 ENCOUNTER — Other Ambulatory Visit (HOSPITAL_COMMUNITY): Payer: Self-pay

## 2022-07-31 MED ORDER — GENVOYA 150-150-200-10 MG PO TABS
1.0000 | ORAL_TABLET | Freq: Every day | ORAL | 5 refills | Status: DC
  Filled 2022-07-31: qty 30, 30d supply, fill #0
  Filled 2022-08-23: qty 30, 30d supply, fill #1
  Filled 2022-09-24 – 2022-10-02 (×2): qty 30, 30d supply, fill #2
  Filled 2022-10-24: qty 30, 30d supply, fill #3
  Filled 2022-11-19: qty 30, 30d supply, fill #4
  Filled 2023-01-21: qty 30, 30d supply, fill #5

## 2022-08-23 ENCOUNTER — Other Ambulatory Visit (HOSPITAL_COMMUNITY): Payer: Self-pay

## 2022-08-30 ENCOUNTER — Other Ambulatory Visit (HOSPITAL_COMMUNITY): Payer: Self-pay

## 2022-09-24 ENCOUNTER — Other Ambulatory Visit (HOSPITAL_COMMUNITY): Payer: Self-pay

## 2022-10-02 ENCOUNTER — Other Ambulatory Visit (HOSPITAL_COMMUNITY): Payer: Self-pay

## 2022-10-02 ENCOUNTER — Other Ambulatory Visit: Payer: Self-pay

## 2022-10-17 ENCOUNTER — Emergency Department (HOSPITAL_COMMUNITY): Payer: Medicare Other

## 2022-10-17 ENCOUNTER — Emergency Department (HOSPITAL_BASED_OUTPATIENT_CLINIC_OR_DEPARTMENT_OTHER): Payer: Medicare Other

## 2022-10-17 ENCOUNTER — Emergency Department (HOSPITAL_COMMUNITY)
Admission: EM | Admit: 2022-10-17 | Discharge: 2022-10-17 | Discharge status: Against Medical Advice | Payer: Medicare Other | Attending: Student | Admitting: Student

## 2022-10-17 DIAGNOSIS — R52 Pain, unspecified: Secondary | ICD-10-CM

## 2022-10-17 DIAGNOSIS — L538 Other specified erythematous conditions: Secondary | ICD-10-CM | POA: Diagnosis not present

## 2022-10-17 DIAGNOSIS — S0990XA Unspecified injury of head, initial encounter: Secondary | ICD-10-CM | POA: Diagnosis not present

## 2022-10-17 DIAGNOSIS — Z86718 Personal history of other venous thrombosis and embolism: Secondary | ICD-10-CM | POA: Diagnosis not present

## 2022-10-17 DIAGNOSIS — M79605 Pain in left leg: Secondary | ICD-10-CM | POA: Diagnosis not present

## 2022-10-17 DIAGNOSIS — R0602 Shortness of breath: Secondary | ICD-10-CM | POA: Diagnosis not present

## 2022-10-17 DIAGNOSIS — M7989 Other specified soft tissue disorders: Secondary | ICD-10-CM | POA: Diagnosis not present

## 2022-10-17 LAB — CBC WITH DIFFERENTIAL/PLATELET
Abs Immature Granulocytes: 0.04 10*3/uL (ref 0.00–0.07)
Basophils Absolute: 0.1 10*3/uL (ref 0.0–0.1)
Basophils Relative: 1 %
Eosinophils Absolute: 0.2 10*3/uL (ref 0.0–0.5)
Eosinophils Relative: 3 %
HCT: 47.7 % (ref 39.0–52.0)
Hemoglobin: 16.3 g/dL (ref 13.0–17.0)
Immature Granulocytes: 1 %
Lymphocytes Relative: 30 %
Lymphs Abs: 2.6 10*3/uL (ref 0.7–4.0)
MCH: 33 pg (ref 26.0–34.0)
MCHC: 34.2 g/dL (ref 30.0–36.0)
MCV: 96.6 fL (ref 80.0–100.0)
Monocytes Absolute: 0.6 10*3/uL (ref 0.1–1.0)
Monocytes Relative: 7 %
Neutro Abs: 5.2 10*3/uL (ref 1.7–7.7)
Neutrophils Relative %: 58 %
Platelets: 198 10*3/uL (ref 150–400)
RBC: 4.94 MIL/uL (ref 4.22–5.81)
RDW: 11.9 % (ref 11.5–15.5)
WBC: 8.7 10*3/uL (ref 4.0–10.5)
nRBC: 0 % (ref 0.0–0.2)

## 2022-10-17 LAB — BASIC METABOLIC PANEL
Anion gap: 7 (ref 5–15)
BUN: 6 mg/dL (ref 6–20)
CO2: 31 mmol/L (ref 22–32)
Calcium: 9.1 mg/dL (ref 8.9–10.3)
Chloride: 100 mmol/L (ref 98–111)
Creatinine, Ser: 1.13 mg/dL (ref 0.61–1.24)
GFR, Estimated: >60 mL/min (ref >60)
Glucose, Bld: 172 mg/dL — ABNORMAL HIGH (ref 70–99)
Potassium: 4 mmol/L (ref 3.5–5.1)
Sodium: 138 mmol/L (ref 135–145)

## 2022-10-17 LAB — BRAIN NATRIURETIC PEPTIDE: B Natriuretic Peptide: 7.6 pg/mL (ref 0.0–100.0)

## 2022-10-17 LAB — TROPONIN I (HIGH SENSITIVITY): Troponin I (High Sensitivity): 6 ng/L (ref <18)

## 2022-10-17 NOTE — ED Notes (Signed)
Pt said, "I am leaving, I am not going to sit out here all day without being seen." Pt and pts wife exited the ED.

## 2022-10-17 NOTE — Progress Notes (Signed)
Lower extremity venous left study completed.  Preliminary results relayed to [place], [place] and Merton Border, RN in triage.   See CV Proc for preliminary results report.   Jean Rosenthal, RDMS, RVT

## 2022-10-17 NOTE — ED Triage Notes (Signed)
Patient here for evaluation left calf pain and tightness that started a few days ago. Reports history of DVT, states he was taken off his blood thinner because it interacted with this HIV medication but he was never prescribed a new one. Patient is alert, oriented, and in no apparent distress at this time.

## 2022-10-17 NOTE — ED Provider Triage Note (Signed)
Emergency Medicine Provider Triage Evaluation Note  Devin [name] , a 61 y.o. male  was evaluated in triage.  Pt complains of left lower extremity pain.  Patient states he has a history of DVT in 2019 and was placed on 2 weeks of anticoagulation prior to discontinuation due to interaction with his HIV medication.  Patient states that over the past week or so he has noticed worsening swelling in the left lower extremity which has been painful.  Patient also complains of a fall sometime within the past month where he hit the back of his head and also jarred his lower back.  Patient states he has a history of previous lower back surgery and is now having pain radiating to the left lower lumbar region down the left leg.  Patient also endorses mild shortness of breath with mild right-sided chest pain  Review of Systems  Positive: As above Negative: As above  Physical Exam  BP 137/86 (BP Location: Right Arm)   Pulse 98   Temp 98.2 F (36.8 C) (Oral)   Resp 19   SpO2 93%  Gen:   Awake, no distress   Resp:  Normal effort  MSK:   Moves extremities without difficulty  Other:    Medical Decision Making  Medically screening exam initiated at 12:41 PM.  Appropriate orders placed.  Zebulan O Craddock was informed that the remainder of the evaluation will be completed by another provider, this initial triage assessment does not replace that evaluation, and the importance of remaining in the ED until their evaluation is complete.     Darrick Grinder, PA-C 10/17/22 1248

## 2022-10-24 ENCOUNTER — Other Ambulatory Visit (HOSPITAL_COMMUNITY): Payer: Self-pay

## 2022-11-19 ENCOUNTER — Other Ambulatory Visit (HOSPITAL_COMMUNITY): Payer: Self-pay

## 2022-11-22 ENCOUNTER — Other Ambulatory Visit (HOSPITAL_COMMUNITY): Payer: Self-pay

## 2022-12-19 ENCOUNTER — Other Ambulatory Visit (HOSPITAL_COMMUNITY): Payer: Self-pay

## 2022-12-26 ENCOUNTER — Encounter: Payer: Self-pay | Admitting: Radiology

## 2023-01-06 ENCOUNTER — Other Ambulatory Visit (HOSPITAL_COMMUNITY): Payer: Self-pay

## 2023-01-08 ENCOUNTER — Other Ambulatory Visit (HOSPITAL_COMMUNITY): Payer: Self-pay

## 2023-01-13 ENCOUNTER — Other Ambulatory Visit (HOSPITAL_COMMUNITY): Payer: Self-pay

## 2023-01-21 ENCOUNTER — Other Ambulatory Visit (HOSPITAL_COMMUNITY): Payer: Self-pay

## 2023-01-22 ENCOUNTER — Other Ambulatory Visit (HOSPITAL_COMMUNITY): Payer: Self-pay

## 2023-02-11 ENCOUNTER — Other Ambulatory Visit (HOSPITAL_COMMUNITY): Payer: Self-pay

## 2023-02-14 ENCOUNTER — Other Ambulatory Visit: Payer: Self-pay | Admitting: Infectious Diseases

## 2023-02-14 ENCOUNTER — Other Ambulatory Visit (HOSPITAL_COMMUNITY): Payer: Self-pay

## 2023-02-14 DIAGNOSIS — B20 Human immunodeficiency virus [HIV] disease: Secondary | ICD-10-CM

## 2023-02-17 ENCOUNTER — Other Ambulatory Visit (HOSPITAL_COMMUNITY): Payer: Self-pay

## 2023-02-17 ENCOUNTER — Other Ambulatory Visit: Payer: Self-pay

## 2023-02-17 MED ORDER — GENVOYA 150-150-200-10 MG PO TABS
1.0000 | ORAL_TABLET | Freq: Every day | ORAL | 5 refills | Status: DC
  Filled 2023-02-17: qty 30, 30d supply, fill #0
  Filled 2023-03-19: qty 30, 30d supply, fill #1

## 2023-02-21 ENCOUNTER — Other Ambulatory Visit (HOSPITAL_COMMUNITY): Payer: Self-pay

## 2023-02-21 ENCOUNTER — Other Ambulatory Visit: Payer: Self-pay

## 2023-03-13 ENCOUNTER — Other Ambulatory Visit (HOSPITAL_COMMUNITY): Payer: Self-pay

## 2023-03-19 ENCOUNTER — Other Ambulatory Visit (HOSPITAL_COMMUNITY): Payer: Self-pay

## 2023-03-19 ENCOUNTER — Other Ambulatory Visit: Payer: Self-pay

## 2023-04-08 ENCOUNTER — Other Ambulatory Visit (HOSPITAL_COMMUNITY): Payer: Self-pay

## 2023-04-11 ENCOUNTER — Other Ambulatory Visit (HOSPITAL_COMMUNITY): Payer: Self-pay

## 2023-04-14 ENCOUNTER — Encounter (HOSPITAL_COMMUNITY): Payer: Self-pay

## 2023-04-14 ENCOUNTER — Other Ambulatory Visit (HOSPITAL_COMMUNITY): Payer: Self-pay

## 2023-04-24 ENCOUNTER — Encounter: Payer: Self-pay | Admitting: Pharmacy Technician

## 2023-04-24 ENCOUNTER — Other Ambulatory Visit (HOSPITAL_COMMUNITY): Payer: Self-pay

## 2023-05-07 ENCOUNTER — Other Ambulatory Visit: Payer: Self-pay

## 2023-05-07 ENCOUNTER — Emergency Department
Admission: EM | Admit: 2023-05-07 | Discharge: 2023-05-09 | Disposition: A | Discharge status: Home / Self Care | Payer: 59 | Source: Home / Self Care | Attending: Emergency Medicine | Admitting: Emergency Medicine

## 2023-05-07 ENCOUNTER — Encounter: Payer: Self-pay | Admitting: Emergency Medicine

## 2023-05-07 ENCOUNTER — Emergency Department: Payer: 59

## 2023-05-07 DIAGNOSIS — F17210 Nicotine dependence, cigarettes, uncomplicated: Secondary | ICD-10-CM | POA: Diagnosis not present

## 2023-05-07 DIAGNOSIS — R000 Tachycardia, unspecified: Secondary | ICD-10-CM | POA: Insufficient documentation

## 2023-05-07 DIAGNOSIS — F1010 Alcohol abuse, uncomplicated: Secondary | ICD-10-CM

## 2023-05-07 DIAGNOSIS — F419 Anxiety disorder, unspecified: Secondary | ICD-10-CM | POA: Insufficient documentation

## 2023-05-07 DIAGNOSIS — E876 Hypokalemia: Secondary | ICD-10-CM | POA: Diagnosis not present

## 2023-05-07 DIAGNOSIS — J45909 Unspecified asthma, uncomplicated: Secondary | ICD-10-CM | POA: Diagnosis not present

## 2023-05-07 DIAGNOSIS — R7309 Other abnormal glucose: Secondary | ICD-10-CM | POA: Diagnosis not present

## 2023-05-07 DIAGNOSIS — R440 Auditory hallucinations: Secondary | ICD-10-CM | POA: Diagnosis not present

## 2023-05-07 DIAGNOSIS — J449 Chronic obstructive pulmonary disease, unspecified: Secondary | ICD-10-CM | POA: Insufficient documentation

## 2023-05-07 DIAGNOSIS — Z21 Asymptomatic human immunodeficiency virus [HIV] infection status: Secondary | ICD-10-CM | POA: Insufficient documentation

## 2023-05-07 LAB — CBC
HCT: 43.8 % (ref 39.0–52.0)
Hemoglobin: 15.2 g/dL (ref 13.0–17.0)
MCH: 32.1 pg (ref 26.0–34.0)
MCHC: 34.7 g/dL (ref 30.0–36.0)
MCV: 92.4 fL (ref 80.0–100.0)
Platelets: 235 10*3/uL (ref 150–400)
RBC: 4.74 MIL/uL (ref 4.22–5.81)
RDW: 12 % (ref 11.5–15.5)
WBC: 6 10*3/uL (ref 4.0–10.5)
nRBC: 0 % (ref 0.0–0.2)

## 2023-05-07 LAB — COMPREHENSIVE METABOLIC PANEL
ALT: 18 U/L (ref 0–44)
AST: 23 U/L (ref 15–41)
Albumin: 4.1 g/dL (ref 3.5–5.0)
Alkaline Phosphatase: 75 U/L (ref 38–126)
Anion gap: 9 (ref 5–15)
BUN: 5 mg/dL — ABNORMAL LOW (ref 6–20)
CO2: 28 mmol/L (ref 22–32)
Calcium: 9.7 mg/dL (ref 8.9–10.3)
Chloride: 104 mmol/L (ref 98–111)
Creatinine, Ser: 1.18 mg/dL (ref 0.61–1.24)
GFR, Estimated: >60 mL/min (ref >60)
Glucose, Bld: 120 mg/dL — ABNORMAL HIGH (ref 70–99)
Potassium: 3.1 mmol/L — ABNORMAL LOW (ref 3.5–5.1)
Sodium: 141 mmol/L (ref 135–145)
Total Bilirubin: 0.9 mg/dL (ref 0.3–1.2)
Total Protein: 7.1 g/dL (ref 6.5–8.1)

## 2023-05-07 LAB — ETHANOL: Alcohol, Ethyl (B): <10 mg/dL (ref <10)

## 2023-05-07 MED ORDER — ZIPRASIDONE MESYLATE 20 MG IM SOLR: 20.0000 mg | INTRAMUSCULAR | Status: DC | PRN

## 2023-05-07 MED ORDER — ACETAMINOPHEN 500 MG PO TABS
1000.0000 mg | ORAL_TABLET | Freq: Once | ORAL | Status: AC
  Administered 2023-05-07: 1000 mg via ORAL
  Filled 2023-05-07: qty 2

## 2023-05-07 MED ORDER — OLANZAPINE 5 MG PO TABS
5.0000 mg | ORAL_TABLET | Freq: Every day | ORAL | Status: DC
  Administered 2023-05-07 – 2023-05-09 (×3): 5 mg via ORAL
  Filled 2023-05-07 (×3): qty 1

## 2023-05-07 MED ORDER — LORAZEPAM 1 MG PO TABS
1.0000 mg | ORAL_TABLET | ORAL | Status: AC | PRN
  Administered 2023-05-08: 1 mg via ORAL
  Filled 2023-05-07: qty 1

## 2023-05-07 MED ORDER — OLANZAPINE 10 MG PO TBDP: 5.0000 mg | ORAL_TABLET | Freq: Three times a day (TID) | ORAL | Status: DC | PRN
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
## 2023-05-07 NOTE — ED Notes (Signed)
Patient is complaining of head ache and states it feels like He is having a stroke, no drooping, face is symmetrical , or no complaints of numbness, no unusual weakness noted.
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
## 2023-05-07 NOTE — ED Notes (Signed)
Pt asked this RN if I have messenger asking "can you reach out to that girl?" When asked what the girls name is, pt had a hard time remembering what her name is." Pt was able to recall her name stating "I think it's Devin [name]." Pt was told that this RN doesn't have access to messenger while in the hospital.
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
## 2023-05-07 NOTE — ED Notes (Signed)
Pt asked if they want their lunch tray that is still on the counter and pt refused.
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
## 2023-05-07 NOTE — ED Notes (Signed)
IVC pending consult   

## 2023-05-07 NOTE — ED Notes (Signed)
Pt is concerned about "this girl that is supposed to be coming to see me today. I'm sure she's worried about me, since she hasn't heard from me. I need to call her because she was supposed to be driving down here today from [place] and I was supposed to send her money."

## 2023-05-07 NOTE — ED Notes (Signed)
Pt is telling this RN that they have not been on meds recently because he doesn't have the pharmacy's number and they don't have his number.
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
## 2023-05-07 NOTE — ED Notes (Signed)
Patient belongings:  1 orange pants 1 Tomoki Lucken tank top
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
## 2023-05-07 NOTE — ED Notes (Signed)
INVOLUNTARY with all papers on chart/awaiting TTS/PSYCH consult ?

## 2023-05-07 NOTE — ED Notes (Signed)
Pt having TTS consult 

## 2023-05-07 NOTE — ED Notes (Signed)
-  Patient received night time snack. 

## 2023-05-08 DIAGNOSIS — R440 Auditory hallucinations: Secondary | ICD-10-CM | POA: Diagnosis not present

## 2023-05-08 MED ORDER — CALCIUM CARBONATE ANTACID 500 MG PO CHEW: 2.0000 | CHEWABLE_TABLET | ORAL | Status: DC | PRN

## 2023-05-08 MED ORDER — ADULT MULTIVITAMIN W/MINERALS CH
1.0000 | ORAL_TABLET | Freq: Every day | ORAL | Status: DC
  Administered 2023-05-08: 1 via ORAL
  Filled 2023-05-08: qty 1

## 2023-05-08 MED ORDER — IPRATROPIUM-ALBUTEROL 0.5-2.5 (3) MG/3ML IN SOLN: 3.0000 mL | RESPIRATORY_TRACT | Status: DC | PRN

## 2023-05-08 MED ORDER — DIAZEPAM 5 MG PO TABS: 10.0000 mg | ORAL_TABLET | Freq: Three times a day (TID) | ORAL | Status: DC | PRN

## 2023-05-08 MED ORDER — AMLODIPINE BESYLATE 5 MG PO TABS
5.0000 mg | ORAL_TABLET | Freq: Every morning | ORAL | Status: DC
  Administered 2023-05-08 – 2023-05-09 (×2): 5 mg via ORAL
  Filled 2023-05-08 (×2): qty 1

## 2023-05-08 MED ORDER — ALBUTEROL SULFATE HFA 108 (90 BASE) MCG/ACT IN AERS: 2.0000 | INHALATION_SPRAY | RESPIRATORY_TRACT | Status: DC | PRN

## 2023-05-08 MED ORDER — DOCUSATE SODIUM 100 MG PO CAPS
100.0000 mg | ORAL_CAPSULE | Freq: Every day | ORAL | Status: DC
  Administered 2023-05-08 – 2023-05-09 (×2): 100 mg via ORAL
  Filled 2023-05-08 (×2): qty 1

## 2023-05-08 MED ORDER — ELVITEG-COBIC-EMTRICIT-TENOFAF 150-150-200-10 MG PO TABS
1.0000 | ORAL_TABLET | Freq: Every day | ORAL | Status: DC
  Administered 2023-05-09: 1 via ORAL
  Filled 2023-05-08: qty 1

## 2023-05-08 MED ORDER — ALBUTEROL SULFATE (2.5 MG/3ML) 0.083% IN NEBU: 2.5000 mg | INHALATION_SOLUTION | Freq: Four times a day (QID) | RESPIRATORY_TRACT | Status: DC | PRN

## 2023-05-08 MED ORDER — PANTOPRAZOLE SODIUM 40 MG PO TBEC
40.0000 mg | DELAYED_RELEASE_TABLET | Freq: Every day | ORAL | Status: DC
  Administered 2023-05-08 – 2023-05-09 (×2): 40 mg via ORAL
  Filled 2023-05-08 (×2): qty 1

## 2023-05-08 MED ORDER — ROSUVASTATIN CALCIUM 5 MG PO TABS
5.0000 mg | ORAL_TABLET | Freq: Every day | ORAL | Status: DC
  Administered 2023-05-08 – 2023-05-09 (×2): 5 mg via ORAL
  Filled 2023-05-08 (×2): qty 1

## 2023-05-08 NOTE — BH Assessment (Addendum)
Per [facility] AC Tresa Endo), patient to be referred out of system.  Referral information for Psychiatric Hospitalization faxed to;   [facility] [phone]- [phone]) No available beds  [facility] [phone]- [phone]),   [facility] [phone]) [phone]  [facility] [phone]), Denied due to no appropriate bed  [facility] [phone]),   [facility] [phone] -or- [phone]),   [facility] [phone]) Denied due to patient not having access to his HIV medication and facility unable to provide the medication he needs  [place] [phone] or [phone]),

## 2023-05-08 NOTE — BH Assessment (Signed)
Patient has been accepted to [facility].  Patient assigned to [facility] Accepting physician is Dr. Loni Beckwith.  Call report to [phone].  Representative was Dewana.   ER Staff is aware of it:  Misty Stanley, ER Secretary  Gwendolyn Fill.,  Patient's Nurse  Address: [address],  [place], [place] [address]  Bed is available tomorrow (05/09/2023) after 10am.

## 2023-05-08 NOTE — ED Notes (Signed)
Report called to kim rn bhu nurse.  Pt to bhu with er tech and security via wheelchair.  Pt alert, calm and cooperative.

## 2023-05-08 NOTE — ED Notes (Signed)
Ivc/consult done/recommends psychiatric inpatient admission when medically cleared.

## 2023-05-08 NOTE — ED Notes (Addendum)
Pt cursing that he has to stay in hospital. Pt reports "I hope I die in here". Pt declines meal tray, states "I aint eating shit".

## 2023-05-08 NOTE — ED Notes (Signed)
Pt transferred from ED 25H to ED [facility] per charge RN instructions. Pt ambulatory with stand-by assist. Pt guided to bathroom and provided a washcloth and body wash per request. Pt informed shower time was in the morning at 11am, but he could wash up using the sink. Pt also provided a hospital meal tray with a cup of fresh ice water, and two warm blankets. Pt expressing concern for not being able to "send a Facebook message" to a male friend and asked EDT if he would be allowed to do so. Pt informed that is prohibited. Pt upset by this information.  Pt currently siting on side of his bed eating provided snack in no acute distress.

## 2023-05-08 NOTE — ED Notes (Signed)
Pt. To BHU from ED ambulatory without difficulty, to room  BHU 3. Report from Amy RN. Pt. Is alert and oriented, warm and dry in no distress. Pt. Denies SI, HI, and AVH. Pt. Calm and cooperative. Pt. Made aware of security cameras and Q15 minute rounds. Pt. Encouraged to let Nursing staff know of any concerns or needs.   ENVIRONMENTAL ASSESSMENT Potentially harmful objects out of patient reach: Yes.   Personal belongings secured: Yes.   Patient dressed in hospital provided attire only: Yes.   Plastic bags out of patient reach: Yes.   Patient care equipment (cords, cables, call bells, lines, and drains) shortened, removed, or accounted for: Yes.   Equipment and supplies removed from bottom of stretcher: Yes.   Potentially toxic materials out of patient reach: Yes.   Sharps container removed or out of patient reach: Yes.     
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
## 2023-05-08 NOTE — ED Notes (Signed)
Lunch tray provided. 

## 2023-05-08 NOTE — BH Assessment (Signed)
Per [facility] M,), patient to be referred out of system.  Referral information for Psychiatric Hospitalization faxed to;   [facility] [phone]- [phone]), No available bed.  [facility] [phone]),   [place] [phone]),  [place] [phone]--- [phone]--- [phone])  [address] [phone]),   [facility] [phone]- [phone]),   [facility] [phone]),  [place] [phone])  [facility] [phone] or [phone]),   [facility] [phone]).  [facility] [phone])

## 2023-05-09 ENCOUNTER — Other Ambulatory Visit: Payer: Self-pay

## 2023-05-09 DIAGNOSIS — R440 Auditory hallucinations: Secondary | ICD-10-CM | POA: Diagnosis not present

## 2023-05-09 LAB — POTASSIUM: Potassium: 3.3 mmol/L — ABNORMAL LOW (ref 3.5–5.1)

## 2023-05-09 MED ORDER — POTASSIUM CHLORIDE CRYS ER 20 MEQ PO TBCR
40.0000 meq | EXTENDED_RELEASE_TABLET | Freq: Once | ORAL | Status: AC
  Administered 2023-05-09: 40 meq via ORAL
  Filled 2023-05-09: qty 2

## 2023-05-09 MED ORDER — GENVOYA 150-150-200-10 MG PO TABS
1.0000 | ORAL_TABLET | Freq: Every day | ORAL | 0 refills | Status: DC
  Filled 2023-05-09: qty 7, 7d supply, fill #0

## 2023-05-09 NOTE — ED Notes (Signed)
DINNER TRAY GIVEN. NO OTHER NEEDS FOUND AT THIS MOMENT.

## 2023-05-09 NOTE — ED Notes (Signed)
ivc/patient has been accepted to [facility] is available (05/09/2023) after 10am.

## 2023-05-09 NOTE — ED Notes (Signed)
[facility] staff called this RN back and confirmed okay to go ahead and send pt over. Confirmed with ER secretary that transport has already been called.

## 2023-05-09 NOTE — ED Notes (Signed)
Pt had no personal belongings bag; pt confirmed himself that he came in with only underwear on and state they have since been thrown away; EMTALA, med nec and other important paperwork and medications given directly to sheriff; pt transported to sheriff's vehicle using wheelchair.

## 2023-05-09 NOTE — ED Notes (Signed)
Pt received phone as requested to briefly talk with his son Will.

## 2023-05-09 NOTE — ED Notes (Signed)
Report given to mica Fairborn for [facility], pt will need to have his potassium replaced with repeat labwork prior to arrival to [facility], dr Lenard Lance made aware and pt will need 7 days of his hiv meds sent with him

## 2023-05-09 NOTE — ED Provider Notes (Signed)
Emergency Medicine Observation Re-evaluation Note  Devin [name] is a 62 y.o. male, who is seen in the emergency department for psychiatric complaint of hearing voices with a history of alcohol and substance abuse.  Physical Exam  BP (!) 136/92 (BP Location: Left Arm)   Pulse 99   Temp 98.3 F (36.8 C) (Oral)   Resp 20   SpO2 98%   ED Course / MDM   Patient's lab work shows a reassuring chemistry, negative alcohol reassuring CBC.  CT scan head is negative as well.  Plan  Current plan is for inpatient treatment.  Psychiatry and TTS are currently working with the patient to place him at [facility].  Pending bed assignment and transport.    Minna Antis, MD 05/09/23 [phone]

## 2023-05-09 NOTE — ED Notes (Addendum)
EMTALA reviewed by this RN, pt ready for transport, pt IVC'ed no consent obtained

## 2023-05-09 NOTE — ED Notes (Signed)
Called pharm to get 7-day prescription of pt's HIV meds. Reminded EDP Paduchowski of [facility] request for this. Pharm states once provider places order they will complete it at out-pt [facility] community pharmacy and bring it to ER to drop it off to this RN.

## 2023-05-09 NOTE — ED Notes (Signed)
Called [facility] to report updated potassium result of 3.3. Left this RN's name and ascom number.

## 2023-05-09 NOTE — ED Notes (Signed)
Called pt's son Will to notify pt did, in fact, have a belongings bag in different area of ER despite him stating he came in in only underwear. Explained it has pt sticker on it and will be in the lost-and-found drawer at the front desk when he can come by and pick it up. Will states this will be no problem and that he can pick it up within the week. This occurred at 1800 05/09/2023. Will understands pt was moved to [facility] and was given main phone number to facility.

## 2023-05-09 NOTE — ED Notes (Signed)
Pt's belongings were place in lost and found cabinet. Paper slip was filled.

## 2023-05-27 ENCOUNTER — Other Ambulatory Visit: Payer: Self-pay

## 2023-05-27 ENCOUNTER — Emergency Department (HOSPITAL_COMMUNITY)
Admission: EM | Admit: 2023-05-27 | Discharge: 2023-05-28 | Disposition: A | Discharge status: Home / Self Care | Payer: 59 | Source: Home / Self Care | Attending: Emergency Medicine | Admitting: Emergency Medicine

## 2023-05-27 DIAGNOSIS — F1510 Other stimulant abuse, uncomplicated: Secondary | ICD-10-CM

## 2023-05-27 DIAGNOSIS — F1010 Alcohol abuse, uncomplicated: Secondary | ICD-10-CM | POA: Insufficient documentation

## 2023-05-27 DIAGNOSIS — B20 Human immunodeficiency virus [HIV] disease: Secondary | ICD-10-CM | POA: Diagnosis present

## 2023-05-27 DIAGNOSIS — F1910 Other psychoactive substance abuse, uncomplicated: Secondary | ICD-10-CM | POA: Insufficient documentation

## 2023-05-27 DIAGNOSIS — F17210 Nicotine dependence, cigarettes, uncomplicated: Secondary | ICD-10-CM | POA: Insufficient documentation

## 2023-05-27 DIAGNOSIS — F29 Unspecified psychosis not due to a substance or known physiological condition: Secondary | ICD-10-CM | POA: Insufficient documentation

## 2023-05-27 DIAGNOSIS — Y900 Blood alcohol level of less than 20 mg/100 ml: Secondary | ICD-10-CM | POA: Insufficient documentation

## 2023-05-27 DIAGNOSIS — F23 Brief psychotic disorder: Secondary | ICD-10-CM

## 2023-05-27 DIAGNOSIS — R443 Hallucinations, unspecified: Secondary | ICD-10-CM

## 2023-05-27 DIAGNOSIS — Z21 Asymptomatic human immunodeficiency virus [HIV] infection status: Secondary | ICD-10-CM | POA: Insufficient documentation

## 2023-05-27 DIAGNOSIS — R440 Auditory hallucinations: Secondary | ICD-10-CM | POA: Diagnosis present

## 2023-05-27 LAB — COMPREHENSIVE METABOLIC PANEL
ALT: 18 U/L (ref 0–44)
AST: 20 U/L (ref 15–41)
Albumin: 3.7 g/dL (ref 3.5–5.0)
Alkaline Phosphatase: 67 U/L (ref 38–126)
Anion gap: 8 (ref 5–15)
BUN: 8 mg/dL (ref 6–20)
CO2: 26 mmol/L (ref 22–32)
Calcium: 9 mg/dL (ref 8.9–10.3)
Chloride: 108 mmol/L (ref 98–111)
Creatinine, Ser: 0.93 mg/dL (ref 0.61–1.24)
GFR, Estimated: >60 mL/min (ref >60)
Glucose, Bld: 100 mg/dL — ABNORMAL HIGH (ref 70–99)
Potassium: 3.5 mmol/L (ref 3.5–5.1)
Sodium: 142 mmol/L (ref 135–145)
Total Bilirubin: 0.4 mg/dL (ref 0.3–1.2)
Total Protein: 6.8 g/dL (ref 6.5–8.1)

## 2023-05-27 LAB — CBC
HCT: 38.1 % — ABNORMAL LOW (ref 39.0–52.0)
Hemoglobin: 13.1 g/dL (ref 13.0–17.0)
MCH: 32.2 pg (ref 26.0–34.0)
MCHC: 34.4 g/dL (ref 30.0–36.0)
MCV: 93.6 fL (ref 80.0–100.0)
Platelets: 231 10*3/uL (ref 150–400)
RBC: 4.07 MIL/uL — ABNORMAL LOW (ref 4.22–5.81)
RDW: 11.6 % (ref 11.5–15.5)
WBC: 7.9 10*3/uL (ref 4.0–10.5)
nRBC: 0 % (ref 0.0–0.2)

## 2023-05-27 LAB — ETHANOL: Alcohol, Ethyl (B): <10 mg/dL (ref <10)

## 2023-05-27 LAB — SALICYLATE LEVEL: Salicylate Lvl: <7 mg/dL — ABNORMAL LOW (ref 7.0–30.0)

## 2023-05-27 LAB — ACETAMINOPHEN LEVEL: Acetaminophen (Tylenol), Serum: <10 ug/mL — ABNORMAL LOW (ref 10–30)

## 2023-05-27 NOTE — ED Notes (Signed)
Updated daughter per pt request.

## 2023-05-27 NOTE — ED Notes (Signed)
Snack was given to pt. 

## 2023-05-27 NOTE — ED Notes (Signed)
Dr. Cyril Loosen at bedside assessing patient.

## 2023-05-27 NOTE — ED Notes (Deleted)
VOL/  PENDING  CONSULT 
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
## 2023-05-27 NOTE — ED Notes (Signed)
IVC PENDING  CONSULT ?

## 2023-05-27 NOTE — ED Provider Notes (Signed)
[facility] Provider Note    None    (approximate)   History   Psychiatric Evaluation (/)   HPI  Devin [name] is a 62 y.o. male brought in by [facility] department under involuntary commitment for visual and auditory hallucinations.  Patient is complaining of people coming into his house patient entered his neighbors home holding a gun     Physical Exam   Triage Vital Signs: ED Triage Vitals  Encounter Vitals Group     BP 05/27/23 1254 (!) 163/105     Systolic BP Percentile --      Diastolic BP Percentile --      Pulse Rate 05/27/23 1254 (!) 108     Resp 05/27/23 1254 18     Temp 05/27/23 1254 98.3 F (36.8 C)     Temp Source 05/27/23 1254 Oral     SpO2 05/27/23 1254 98 %     Weight 05/27/23 1255 104.6 kg (230 lb 9.6 oz)     Height 05/27/23 1255 1.854 m (6\' 1" )     Head Circumference --      Peak Flow --      Pain Score 05/27/23 1255 8     Pain Loc --      Pain Education --      Exclude from Growth Chart --     Most recent vital signs: Vitals:   05/27/23 1254  BP: (!) 163/105  Pulse: (!) 108  Resp: 18  Temp: 98.3 F (36.8 C)  SpO2: 98%     General: Awake, no distress.  CV:  Good peripheral perfusion.  Resp:  Normal effort.  Abd:  No distention.  Other:  Patient is quite convinced that his neighbor is entering his home when he is sleeping.,  This appears to be consistent with hallucinations   ED Results / Procedures / Treatments   Labs (all labs ordered are listed, but only abnormal results are displayed) Labs Reviewed  COMPREHENSIVE METABOLIC PANEL - Abnormal; Notable for the following components:      Result Value   Glucose, Bld 100 (*)    All other components within normal limits  SALICYLATE LEVEL - Abnormal; Notable for the following components:   Salicylate Lvl <7.0 (*)    All other components within normal limits  ACETAMINOPHEN LEVEL - Abnormal; Notable for the following components:   Acetaminophen (Tylenol),  Serum <10 (*)    All other components within normal limits  CBC - Abnormal; Notable for the following components:   RBC 4.07 (*)    HCT 38.1 (*)    All other components within normal limits  ETHANOL  URINE DRUG SCREEN, QUALITATIVE ([facility] ONLY)     EKG     RADIOLOGY     PROCEDURES:  Critical Care performed:   Procedures   MEDICATIONS ORDERED IN ED: Medications - No data to display   IMPRESSION / MDM / ASSESSMENT AND PLAN / ED COURSE  I reviewed the triage vital signs and the nursing notes. Patient's presentation is most consistent with severe exacerbation of chronic illness.   Review of records demonstrates the patient was admitted to [facility] psychiatric hospital on July 11 for similar complaints.  Here with auditory and visual hallucinations.  Will consult behavioral team  Lab work today is reassuring, this appears to be a psychiatric condition, medically cleared for psychiatric evaluation.   The patient has been placed in psychiatric observation due to the need to provide a safe environment  for the patient while obtaining psychiatric consultation and evaluation, as well as ongoing medical and medication management to treat the patient's condition.  The patient has been placed under full IVC at this time.       FINAL CLINICAL IMPRESSION(S) / ED DIAGNOSES   Final diagnoses:  Hallucinations  Psychosis, unspecified psychosis type (HCC)     Rx / DC Orders   ED Discharge Orders     None        Note:  This document was prepared using Dragon voice recognition software and may include unintentional dictation errors.   Jene Every, MD 05/27/23 1341

## 2023-05-27 NOTE — ED Notes (Signed)
Patient belongings:  1 Jahzir Strohmeier shirt 1 Keydi Giel pants 1 Jaquetta Currier underwear

## 2023-05-27 NOTE — BH Assessment (Signed)
Comprehensive Clinical Assessment (CCA) Note  05/27/2023 Devin [name] [id number]  Chief Complaint:  Chief Complaint  Patient presents with   Psychiatric Evaluation        Visit Diagnosis: Psychosis   Devin [name] is a 62 year old male who presents to the ER via law enforcement, under IVC. Per the IVC, the patient was at his neighbors' home, with a gun, due to the belief of someone was entering his home using his microwave and stealing items. IVC states the patient isn't taking his mental health medications. Per the patient, he was brought to the ER by the "deputy because he don't like me. It's the same one who brought me up here last time. You don't see that is a sign. That's a sign that I'm not crazy." He further reports, no one believes him and no one sees the "red flags" but him.  During the interview, the patient was hyper-verbal and tangential. He was fixated on the officer that brought him to the ER. He shared how the neighbor comes in his house and uses his microwave and reset the clock on his stove. Also shared how "some woman I don't know hack my phone and stealing my internet. I know she can see me. I gave her my email and password." Patient was difficult to redirect. When asked questions, he would say, "I need you to believe me" and start talking about other things that wasn't related to the question. Patient was seen in the ER, early this month for similar presentation.  CCA Screening, Triage and Referral (STR)  Patient Reported Information How did you hear about [place]? Legal System  What Is the Reason for Your Visit/Call Today? Patient brought to the ER by law enforcement due to after he was placed on IVC.  How Long Has This Been Causing You Problems? 1 wk - 1 month  What Do You Feel Would Help You the Most Today? Treatment for Depression or other mood problem   Have You Recently Had Any Thoughts About Hurting Yourself? No  Are You Planning to Commit Suicide/Harm Yourself At  This time? No   Flowsheet Row ED from 05/27/2023 in [facility] Emergency Department at [facility] ED from 05/07/2023 in [facility] Emergency Department at [facility] ED from 10/17/2022 in [facility] Emergency Department at [facility]  C-SSRS RISK CATEGORY No Risk No Risk No Risk       Have you Recently Had Thoughts About Hurting Someone [name]? No  Are You Planning to Harm Someone at This Time? No  Explanation: No data recorded  Have You Used Any Alcohol or Drugs in the Past 24 Hours? No  What Did You Use and How Much? No data recorded  Do You Currently Have a Therapist/Psychiatrist? No  Name of Therapist/Psychiatrist:    Have You Been Recently Discharged From Any Office Practice or Programs? No  Explanation of Discharge From Practice/Program: No data recorded    CCA Screening Triage Referral Assessment Type of Contact: Face-to-Face  Telemedicine Service Delivery:   Is this Initial or Reassessment?   Date Telepsych consult ordered in [facility]:    Time Telepsych consult ordered in [facility]:    Location of Assessment: [facility] ED  Provider Location: [facility] ED   Collateral Involvement: None provided   Does Patient Have a Court Appointed Legal Guardian? No  Legal Guardian Contact Information: No data recorded Copy of Legal Guardianship Form: No data recorded Legal Guardian Notified of Arrival: No data recorded Legal Guardian  Notified of Pending Discharge: No data recorded If Minor and Not Living with Parent(s), Who has Custody? No data recorded Is CPS involved or ever been involved? Never  Is APS involved or ever been involved? Never   Patient Determined To Be At Risk for Harm To Self or Others Based on Review of Patient Reported Information or Presenting Complaint? Yes, for Harm to Others  Method: No Plan  Availability of Means: In hand or used  Intent: Vague intent or NA  Notification Required: No need or identified person  Additional Information  for Danger to Others Potential: No data recorded Additional Comments for Danger to Others Potential: No data recorded Are There Guns or Other Weapons in Your Home? Yes  Types of Guns/Weapons: Unknown  Are These Weapons Safely Secured?                            Yes  Who Could Verify You Are Able To Have These Secured: No data recorded Do You Have any Outstanding Charges, Pending Court Dates, Parole/Probation? No data recorded Contacted To Inform of Risk of Harm To Self or Others: No data recorded   Does Patient Present under Involuntary Commitment? Yes    [place] of Residence: [place]   Patient Currently Receiving the Following Services: Not Receiving Services   Determination of Need: Emergent (2 hours)   Options For Referral: Inpatient Hospitalization; ED Visit     CCA Biopsychosocial Patient Reported Schizophrenia/Schizoaffective Diagnosis in Past: No   Strengths: Have housing, able to communicate needs and polite.   Mental Health Symptoms Depression:   Difficulty Concentrating; Sleep (too much or little)   Duration of Depressive symptoms:  Duration of Depressive Symptoms: N/A   Mania:   Increased Energy; Irritability; Racing thoughts; Recklessness   Anxiety:    Difficulty concentrating; Restlessness; Worrying   Psychosis:   Hallucinations   Duration of Psychotic symptoms:  Duration of Psychotic Symptoms: Greater than six months   Trauma:   N/A   Obsessions:   Cause anxiety; Intrusive/time consuming; Poor insight; Recurrent & persistent thoughts/impulses/images   Compulsions:   N/A; Absent insight/delusional; Intrusive/time consuming   Inattention:   Does not seem to listen   Hyperactivity/Impulsivity:   Blurts out answers; Feeling of restlessness   Oppositional/Defiant Behaviors:   None   Emotional Irregularity:   N/A   Other Mood/Personality Symptoms:  No data recorded   Mental Status Exam Appearance and self-care  Stature:    Average   Weight:   Average weight   Clothing:   Disheveled   Grooming:   Neglected   Cosmetic use:   None   Posture/gait:   Normal   Motor activity:   -- (Within normal range)   Sensorium  Attention:   Distractible   Concentration:   Scattered; Focuses on irrelevancies   Orientation:   Person   Recall/memory:   Defective in Immediate   Affect and Mood  Affect:   Anxious; Labile   Mood:   Anxious   Relating  Eye contact:   Fleeting   Facial expression:   Anxious; Responsive; Tense   Attitude toward examiner:   Cooperative; Dramatic   Thought and Language  Speech flow:  Flight of Ideas   Thought content:   Appropriate to Mood and Circumstances   Preoccupation:   Obsessions   Hallucinations:   Visual; Auditory (Per law enforcement)   Organization:   Irrelevant; Tangential   [employer] of  Knowledge:   Fair   Intelligence:   Average   Abstraction:   Abstract   Judgement:   Impaired   Reality Testing:   Distorted   Insight:   Poor   Decision Making:   Impulsive   Social Functioning  Social Maturity:   Impulsive   Social Judgement:   Heedless; Victimized   Stress  Stressors:   Other (Comment)   Coping Ability:   Deficient supports   Skill Deficits:   None   Supports:   Support needed     Religion: Religion/Spirituality Are You A Religious Person?: No  Leisure/Recreation: Leisure / Recreation Do You Have Hobbies?: No  Exercise/Diet: Exercise/Diet Do You Exercise?: No Have You Gained or Lost A Significant Amount of Weight in the Past Six Months?: No Do You Follow a Special Diet?: No Do You Have Any Trouble Sleeping?: Yes Explanation of Sleeping Difficulties: States he has trouble falling asleep.   CCA Employment/Education Employment/Work Situation: Employment / Work Situation Employment Situation: Unemployed  Education: Education Is Patient Currently Attending School?:  No Did You Have An Individualized Education Program (IIEP): No Did You Have Any Difficulty At [employer]?: No Patient's Education Has Been Impacted by Current Illness: No   CCA Family/Childhood History Family and Relationship History: Family history Marital status: Single Does patient have children?: No  Childhood History:  Childhood History By whom was/is the patient raised?: Both parents Did patient suffer any verbal/emotional/physical/sexual abuse as a child?: No Did patient suffer from severe childhood neglect?: No Has patient ever been sexually abused/assaulted/raped as an adolescent or adult?: No Was the patient ever a victim of a crime or a disaster?: No Witnessed domestic violence?: No Has patient been affected by domestic violence as an adult?: No   CCA Substance Use Alcohol/Drug Use: Alcohol / Drug Use Pain Medications: See PTA Prescriptions: See PTA Over the Counter: See PTA History of alcohol / drug use?: No history of alcohol / drug abuse Longest period of sobriety (when/how long): n/a   ASAM's:  Six Dimensions of Multidimensional Assessment  Dimension 1:  Acute Intoxication and/or Withdrawal Potential:      Dimension 2:  Biomedical Conditions and Complications:      Dimension 3:  Emotional, Behavioral, or Cognitive Conditions and Complications:     Dimension 4:  Readiness to Change:     Dimension 5:  Relapse, Continued use, or Continued Problem Potential:     Dimension 6:  Recovery/Living Environment:     ASAM Severity Score:    ASAM Recommended Level of Treatment:     Substance use Disorder (SUD)    Recommendations for Services/Supports/Treatments:    Discharge Disposition:    DSM5 Diagnoses: Patient Active Problem List   Diagnosis Date Noted   Auditory hallucination 05/07/2023   Asthmatic bronchitis , chronic 05/29/2022   Cigarette smoker 05/29/2022   Poor dentition requiring referral to dentistry 05/08/2021   S/P spinal fusion 01/23/2018    At risk for obstructive sleep apnea 12/05/2017   Hepatitis B immune 09/10/2017   Spondylolisthesis of lumbar region 06/06/2017   Pulmonary emboli (HCC) 02/20/2017   Pulmonary infarct (HCC) 02/20/2017   Adolescent idiopathic scoliosis of thoracolumbar region 09/14/2015   Dysphagia 01/11/2014   Encounter for screening colonoscopy 01/11/2014   ETOH abuse 12/01/2013   Encounter for long-term (current) use of other medications 04/26/2013   Other long term (current) drug therapy 04/26/2013   Essential hypertension, benign 04/12/2013   CHEST PAIN 06/12/2010   ABDOMINAL PAIN 06/12/2010   ACUTE  SINUSITIS, UNSPECIFIED 11/24/2009   ALLERGIC RHINITIS 02/14/2009   Tobacco dependence syndrome 11/15/2008   ACUTE BRONCHITIS 03/04/2008   DEPRESSION 12/03/2006   LOW BACK PAIN 12/03/2006   Human immunodeficiency virus (HIV) disease (HCC) 11/13/2006   Substance abuse (HCC) 11/13/2006   Chronic obstructive pulmonary disease (HCC) 11/13/2006   GERD 11/13/2006   Recurrent knee instability, 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

## 2023-05-28 ENCOUNTER — Inpatient Hospital Stay
Admission: AD | Admit: 2023-05-28 | Discharge: 2023-06-03 | DRG: 885 | Disposition: A | Discharge status: Home / Self Care | Payer: 59 | Source: Intra-hospital | Attending: Psychiatry | Admitting: Psychiatry

## 2023-05-28 ENCOUNTER — Encounter: Payer: Self-pay | Admitting: Psychiatric/Mental Health

## 2023-05-28 DIAGNOSIS — Z5941 Food insecurity: Secondary | ICD-10-CM | POA: Diagnosis not present

## 2023-05-28 DIAGNOSIS — Y900 Blood alcohol level of less than 20 mg/100 ml: Secondary | ICD-10-CM | POA: Diagnosis present

## 2023-05-28 DIAGNOSIS — I10 Essential (primary) hypertension: Secondary | ICD-10-CM | POA: Diagnosis present

## 2023-05-28 DIAGNOSIS — J4489 Other specified chronic obstructive pulmonary disease: Secondary | ICD-10-CM | POA: Diagnosis present

## 2023-05-28 DIAGNOSIS — F1910 Other psychoactive substance abuse, uncomplicated: Secondary | ICD-10-CM | POA: Diagnosis present

## 2023-05-28 DIAGNOSIS — Z5982 Transportation insecurity: Secondary | ICD-10-CM | POA: Diagnosis not present

## 2023-05-28 DIAGNOSIS — F23 Brief psychotic disorder: Principal | ICD-10-CM | POA: Diagnosis present

## 2023-05-28 DIAGNOSIS — Z21 Asymptomatic human immunodeficiency virus [HIV] infection status: Secondary | ICD-10-CM | POA: Diagnosis present

## 2023-05-28 DIAGNOSIS — F1010 Alcohol abuse, uncomplicated: Secondary | ICD-10-CM | POA: Diagnosis present

## 2023-05-28 DIAGNOSIS — F17210 Nicotine dependence, cigarettes, uncomplicated: Secondary | ICD-10-CM | POA: Diagnosis present

## 2023-05-28 DIAGNOSIS — Z79899 Other long term (current) drug therapy: Secondary | ICD-10-CM

## 2023-05-28 DIAGNOSIS — F29 Unspecified psychosis not due to a substance or known physiological condition: Secondary | ICD-10-CM | POA: Diagnosis present

## 2023-05-28 DIAGNOSIS — F1510 Other stimulant abuse, uncomplicated: Secondary | ICD-10-CM

## 2023-05-28 MED ORDER — POTASSIUM CHLORIDE CRYS ER 20 MEQ PO TBCR
20.0000 meq | EXTENDED_RELEASE_TABLET | Freq: Every day | ORAL | Status: DC
  Administered 2023-05-28 – 2023-06-03 (×7): 20 meq via ORAL
  Filled 2023-05-28 (×7): qty 1

## 2023-05-28 MED ORDER — TRAZODONE HCL 50 MG PO TABS: 50.0000 mg | ORAL_TABLET | Freq: Every evening | ORAL | Status: DC | PRN

## 2023-05-28 MED ORDER — HYDROXYZINE HCL 25 MG PO TABS
25.0000 mg | ORAL_TABLET | Freq: Three times a day (TID) | ORAL | Status: DC | PRN
  Administered 2023-05-28 – 2023-05-30 (×2): 25 mg via ORAL
  Filled 2023-05-28 (×2): qty 1

## 2023-05-28 MED ORDER — ACETAMINOPHEN 325 MG PO TABS
650.0000 mg | ORAL_TABLET | Freq: Once | ORAL | Status: AC
  Administered 2023-05-28: 650 mg via ORAL
  Filled 2023-05-28: qty 2

## 2023-05-28 MED ORDER — FUROSEMIDE 20 MG PO TABS
40.0000 mg | ORAL_TABLET | Freq: Every day | ORAL | Status: DC
  Administered 2023-05-28 – 2023-06-03 (×7): 40 mg via ORAL
  Filled 2023-05-28 (×8): qty 2

## 2023-05-28 MED ORDER — ALUM & MAG HYDROXIDE-SIMETH 200-200-20 MG/5ML PO SUSP: 30.0000 mL | ORAL | Status: DC | PRN

## 2023-05-28 MED ORDER — DIPHENHYDRAMINE HCL 25 MG PO CAPS: 50.0000 mg | ORAL_CAPSULE | Freq: Three times a day (TID) | ORAL | Status: DC | PRN

## 2023-05-28 MED ORDER — HALOPERIDOL 5 MG PO TABS: 5.0000 mg | ORAL_TABLET | Freq: Three times a day (TID) | ORAL | Status: DC | PRN

## 2023-05-28 MED ORDER — ELVITEG-COBIC-EMTRICIT-TENOFAF 150-150-200-10 MG PO TABS
1.0000 | ORAL_TABLET | Freq: Every day | ORAL | Status: DC
  Administered 2023-05-28 – 2023-06-03 (×7): 1 via ORAL
  Filled 2023-05-28 (×7): qty 1

## 2023-05-28 MED ORDER — ACETAMINOPHEN 325 MG PO TABS
650.0000 mg | ORAL_TABLET | Freq: Four times a day (QID) | ORAL | Status: DC | PRN
  Administered 2023-05-29 – 2023-05-31 (×3): 650 mg via ORAL
  Filled 2023-05-28 (×3): qty 2

## 2023-05-28 MED ORDER — TRAZODONE HCL 100 MG PO TABS
100.0000 mg | ORAL_TABLET | Freq: Every evening | ORAL | Status: DC | PRN
  Administered 2023-05-28: 100 mg via ORAL
  Filled 2023-05-28: qty 1

## 2023-05-28 MED ORDER — LORAZEPAM 1 MG PO TABS: 2.0000 mg | ORAL_TABLET | Freq: Three times a day (TID) | ORAL | Status: DC | PRN

## 2023-05-28 MED ORDER — DOCUSATE SODIUM 100 MG PO CAPS
100.0000 mg | ORAL_CAPSULE | Freq: Two times a day (BID) | ORAL | Status: DC
  Administered 2023-05-28 – 2023-06-03 (×11): 100 mg via ORAL
  Filled 2023-05-28 (×13): qty 1

## 2023-05-28 MED ORDER — HALOPERIDOL LACTATE 5 MG/ML IJ SOLN: 5.0000 mg | Freq: Three times a day (TID) | INTRAMUSCULAR | Status: DC | PRN

## 2023-05-28 MED ORDER — AMLODIPINE BESYLATE 5 MG PO TABS
5.0000 mg | ORAL_TABLET | Freq: Every day | ORAL | Status: DC
  Administered 2023-05-28 – 2023-06-03 (×7): 5 mg via ORAL
  Filled 2023-05-28 (×7): qty 1

## 2023-05-28 MED ORDER — DIPHENHYDRAMINE HCL 50 MG/ML IJ SOLN: 50.0000 mg | Freq: Three times a day (TID) | INTRAMUSCULAR | Status: DC | PRN

## 2023-05-28 MED ORDER — MAGNESIUM HYDROXIDE 400 MG/5ML PO SUSP: 30.0000 mL | Freq: Every day | ORAL | Status: DC | PRN

## 2023-05-28 MED ORDER — IPRATROPIUM-ALBUTEROL 20-100 MCG/ACT IN AERS
1.0000 | INHALATION_SPRAY | Freq: Four times a day (QID) | RESPIRATORY_TRACT | Status: DC | PRN
  Administered 2023-05-30 – 2023-06-03 (×5): 1 via RESPIRATORY_TRACT
  Filled 2023-05-28: qty 4

## 2023-05-28 MED ORDER — APIXABAN 2.5 MG PO TABS
2.5000 mg | ORAL_TABLET | Freq: Two times a day (BID) | ORAL | Status: DC
  Administered 2023-05-28 – 2023-06-03 (×13): 2.5 mg via ORAL
  Filled 2023-05-28 (×14): qty 1

## 2023-05-28 MED ORDER — ATORVASTATIN CALCIUM 10 MG PO TABS
10.0000 mg | ORAL_TABLET | Freq: Every day | ORAL | Status: DC
  Administered 2023-05-28 – 2023-06-03 (×7): 10 mg via ORAL
  Filled 2023-05-28 (×7): qty 1

## 2023-05-28 MED ORDER — PANTOPRAZOLE SODIUM 40 MG PO TBEC
40.0000 mg | DELAYED_RELEASE_TABLET | Freq: Every day | ORAL | Status: DC
  Administered 2023-05-28 – 2023-06-03 (×7): 40 mg via ORAL
  Filled 2023-05-28 (×7): qty 1

## 2023-05-28 MED ORDER — LORAZEPAM 2 MG/ML IJ SOLN: 2.0000 mg | Freq: Three times a day (TID) | INTRAMUSCULAR | Status: DC | PRN

## 2023-05-28 MED ORDER — RISPERIDONE 1 MG PO TABS
0.5000 mg | ORAL_TABLET | ORAL | Status: DC
  Administered 2023-05-28 – 2023-06-03 (×14): 0.5 mg via ORAL
  Filled 2023-05-28 (×14): qty 1

## 2023-05-28 NOTE — ED Provider Notes (Signed)
Emergency Medicine Observation Re-evaluation Note  Devin [name] is a 62 y.o. male, seen on rounds today.  Pt initially presented to the ED for complaints of Psychiatric Evaluation (/) Currently, the patient is resting, voices no medical complaints.  Physical Exam  BP (!) 163/105 (BP Location: Left Arm)   Pulse (!) 108   Temp 98.3 F (36.8 C) (Oral)   Resp 18   Ht 6\' 1"  (1.854 m)   Wt 104.6 kg   SpO2 98%   BMI 30.42 kg/m  Physical Exam General: Resting in no acute distress Cardiac: No cyanosis Lungs: Equal rise and fall Psych: Not agitated  ED Course / MDM  EKG:   I have reviewed the labs performed to date as well as medications administered while in observation.  Recent changes in the last 24 hours include looked at scalp per patient's request; did not see any obvious abrasions or redness, given Tylenol for discomfort.  Plan  Current plan is for psychiatric disposition.    Irean Hong, MD 05/28/23 [phone]

## 2023-05-28 NOTE — BHH Suicide Risk Assessment (Signed)
[facility] Admission Suicide Risk Assessment   Nursing information obtained from:    Demographic factors:    Current Mental Status:    Loss Factors:    Historical Factors:    Risk Reduction Factors:     Total Time spent with patient: 1 hour Principal Problem: Acute psychosis (HCC) Diagnosis:  Principal Problem:   Acute psychosis (HCC)  Subjective Data: Devin [name] is a 62 year old male who presents to the ER via law enforcement, under IVC. Per the IVC, the patient was at his neighbors' home, with a gun, due to the belief of someone was entering his home using his microwave and stealing items. IVC states the patient isn't taking his mental health medications. Per the patient, he was brought to the ER by the "deputy because he don't like me. It's the same one who brought me up here last time. You don't see that is a sign. That's a sign that I'm not crazy." He further reports, no one believes him and no one sees the "red flags" but him.   During the interview, the patient was hyper-verbal and tangential. He was fixated on the officer that brought him to the ER. He shared how the neighbor comes in his house and uses his microwave and reset the clock on his stove. Also shared how "some woman I don't know hack my phone and stealing my internet. I know she can see me. I gave her my email and password." Patient was difficult to redirect. When asked questions, he would say, "I need you to believe me" and start talking about other things that wasn't related to the question. Patient was seen in the ER, early this month for similar presentation.  Continued Clinical Symptoms:    The "Alcohol Use Disorders Identification Test", Guidelines for Use in Primary Care, Second Edition.  World Science writer [facility]). Score between 0-7:  no or low risk or alcohol related problems. Score between 8-15:  moderate risk of alcohol related problems. Score between 16-19:  high risk of alcohol related problems. Score 20 or  above:  warrants further diagnostic evaluation for alcohol dependence and treatment.   CLINICAL FACTORS:   Schizophrenia:   Paranoid or undifferentiated type   Musculoskeletal: Strength & Muscle Tone: within normal limits Gait & Station: normal Patient leans: N/A  Psychiatric Specialty Exam:  Presentation  General Appearance:  Bizarre; Disheveled  Eye Contact: Fleeting  Speech: Pressured  Speech Volume: Normal  Handedness: Right   Mood and Affect  Mood: Anxious; Irritable  Affect: Inappropriate; Full Range   Thought Process  Thought Processes: Disorganized  Descriptions of Associations:Loose  Orientation:Full (Time, Place and Person)  Thought Content:Illogical  History of Schizophrenia/Schizoaffective disorder:No  Duration of Psychotic Symptoms:N/A  Hallucinations:Hallucinations: None  Ideas of Reference:None  Suicidal Thoughts:Suicidal Thoughts: No  Homicidal Thoughts:Homicidal Thoughts: No   Sensorium  Memory: Immediate Poor  Judgment: Impaired  Insight: Lacking; Shallow   Executive Functions  Concentration: Poor  Attention Span: Poor  Recall: Poor  Fund of Knowledge: Poor  Language: Poor   Psychomotor Activity  Psychomotor Activity: Psychomotor Activity: Normal   Assets  Assets: Communication Skills; Housing   Sleep  Sleep: Sleep: Poor     Blood pressure (!) 143/102, pulse 92, temperature 98.3 F (36.8 C), temperature source Temporal, resp. rate 16, height 6\' 1"  (1.854 m), weight 78.5 kg, SpO2 98%. Body mass index is 22.82 kg/m.   COGNITIVE FEATURES THAT CONTRIBUTE TO RISK:  Loss of executive function    SUICIDE RISK:  Minimal: No identifiable suicidal ideation.  Patients presenting with no risk factors but with morbid ruminations; may be classified as minimal risk based on the severity of the depressive symptoms  PLAN OF CARE: See orders  I certify that inpatient services furnished can  reasonably be expected to improve the patient's condition.   Sarina Ill, DO 05/28/2023, 10:57 AM
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
## 2023-05-28 NOTE — Plan of Care (Signed)

## 2023-05-28 NOTE — Progress Notes (Signed)
Patient was unable to consume lunch saying that he did not feel well. He then proceeded to cough up thick green sputum. MD informed and a respiratory consult was placed. Second complained of left-sided head pain asking for his head to be scanned. "I can only take Tylenol because of my stomach ulcers." When this writer offered Tylenol patient refused and said that it wouldn't do anything for him. MD reviewed chart with writer and it was noted that patient had a CT of the head performed on May 07, 2023. Impression showed no acute intracranial abnormality. He consumed dinner but was unable to ambulate with walker safely to the dayroom. Geri chair provided.

## 2023-05-28 NOTE — Group Note (Signed)
Date:  05/28/2023 Time:  9:36 PM  Group Topic/Focus:  Self Esteem Action Plan:   The focus of this group is to help patients create a plan to continue to build self-esteem after discharge.    Participation Level:  Did Not Attend   Insight: None  Engagement in Group:  None  Modes of Intervention:  Discussion  Additional Comments:    Osker Mason 05/28/2023, 9:36 PM

## 2023-05-28 NOTE — ED Notes (Addendum)
Report to [place], [facility]ity fund[facility]. Pt sent to ger[facility] unit via wheelchair with ED Tech, security, appropriate paperwork and 1 bag of belongings via wheelchair. Clean and dry. Pt in NAD, ambulates

## 2023-05-28 NOTE — Group Note (Addendum)
Date:  05/28/2023 Time:  3:04 PM  Group Topic/Focus:  Coping With Mental Health Crisis:   The purpose of this group is to help patients identify strategies for coping with mental health crisis.  Group discusses possible causes of crisis and ways to manage them effectively. Healthy Communication:   The focus of this group is to discuss communication, barriers to communication, as well as healthy ways to communicate with others.    Participation Level:  Did Not Attend  Participation Quality:    Affect:    Cognitive:    Insight:   Engagement in Group:    Modes of Intervention:    Additional Comments:    Tanae Petrosky L Margaruite Top 05/28/2023, 3:04 PM

## 2023-05-28 NOTE — Group Note (Signed)
Recreation Therapy Group Note   Group Topic:Communication  Group Date: 05/28/2023 Start Time: 0900 End Time: 1000 Facilitators: Rosina Lowenstein, LRT, CTRS Location: Courtyard   Group Description: Music Reminisce. LRT encouraged patients to think of their favorite song(s) that reminded them of a positive memory or time in their life. LRT encouraged patient to talk about that memory aloud to the group. LRT played the song through a speaker for all to hear. LRT and patients discussed how thinking of a positive memory or time in their life can be used as a coping skill in everyday life post discharge.    Goal Area(s) Addressed: Patient will increase verbal communication by conversing with peers. Patient will contribute to group discussion with minimal prompting. Patient will reminisce a positive memory or moment in their life.    Affect/Mood: N/A   Participation Level: Did not attend    Clinical Observations/Individualized Feedback: Devin [name] did not attend group due to being a new admission.   Plan: Continue to engage patient in RT group sessions 2-3x/week.   Rosina Lowenstein, LRT, CTRS 05/28/2023 11:03 AM

## 2023-05-28 NOTE — Plan of Care (Signed)
  Problem: Health Behavior/Discharge Planning: Goal: Ability to manage health-related needs will improve Outcome: Progressing   Problem: Clinical Measurements: Goal: Ability to maintain clinical measurements within normal limits will improve Outcome: Progressing Goal: Will remain free from infection Outcome: Progressing   

## 2023-05-28 NOTE — ED Notes (Signed)
ED Provider at bedside. 
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
## 2023-05-28 NOTE — BH IP Treatment Plan (Signed)
Interdisciplinary Treatment and Diagnostic Plan Update  05/28/2023 Time of Session: 10:35 AM  Devin [name] MRN: [id number]  Principal Diagnosis: Acute psychosis (HCC)  Secondary Diagnoses: Principal Problem:   Acute psychosis (HCC)   Current Medications:  Current Facility-Administered Medications  Medication Dose Route Frequency Provider Last Rate Last Admin   acetaminophen (TYLENOL) tablet 650 mg  650 mg Oral Q6H PRN Jearld Lesch, NP       alum & mag hydroxide-simeth (MAALOX/MYLANTA) 200-200-20 MG/5ML suspension 30 mL  30 mL Oral Q4H PRN Durwin Nora, Rashaun M, NP       amLODipine (NORVASC) tablet 5 mg  5 mg Oral Daily Sarina Ill, DO       apixaban Everlene Balls) tablet 2.5 mg  2.5 mg Oral BID Sarina Ill, DO       atorvastatin (LIPITOR) tablet 10 mg  10 mg Oral Daily Sarina Ill, DO       diphenhydrAMINE (BENADRYL) capsule 50 mg  50 mg Oral TID PRN Jearld Lesch, NP       Or   diphenhydrAMINE (BENADRYL) injection 50 mg  50 mg Intramuscular TID PRN Durwin Nora, Rashaun M, NP       docusate sodium (COLACE) capsule 100 mg  100 mg Oral BID Sarina Ill, DO       elvitegravir-cobicistat-emtricitabine-tenofovir (GENVOYA) 150-150-200-10 MG tablet 1 tablet  1 tablet Oral Q breakfast Sarina Ill, DO       furosemide (LASIX) tablet 40 mg  40 mg Oral Daily Sarina Ill, DO       haloperidol (HALDOL) tablet 5 mg  5 mg Oral TID PRN Jearld Lesch, NP       Or   haloperidol lactate (HALDOL) injection 5 mg  5 mg Intramuscular TID PRN Jearld Lesch, NP       hydrOXYzine (ATARAX) tablet 25 mg  25 mg Oral TID PRN Jearld Lesch, NP       Ipratropium-Albuterol (COMBIVENT) respimat 1 puff  1 puff Inhalation Q6H PRN Sarina Ill, DO       LORazepam (ATIVAN) tablet 2 mg  2 mg Oral TID PRN Jearld Lesch, NP       Or   LORazepam (ATIVAN) injection 2 mg  2 mg Intramuscular TID PRN Dixon, Rashaun M, NP       magnesium  hydroxide (MILK OF MAGNESIA) suspension 30 mL  30 mL Oral Daily PRN Durwin Nora, Rashaun M, NP       pantoprazole (PROTONIX) EC tablet 40 mg  40 mg Oral Daily Herrick, Richard Edward, DO       potassium chloride SA (KLOR-CON M) CR tablet 20 mEq  20 mEq Oral QPC breakfast Sarina Ill, DO       risperiDONE (RISPERDAL) tablet 0.5 mg  0.5 mg Oral BH-q8a4p Sarina Ill, DO       traZODone (DESYREL) tablet 100 mg  100 mg Oral QHS PRN Sarina Ill, DO       PTA Medications: Medications Prior to Admission  Medication Sig Dispense Refill Last Dose   albuterol (PROVENTIL HFA;VENTOLIN HFA) 108 (90 BASE) MCG/ACT inhaler Inhale 2 puffs into the lungs every 4 (four) hours as needed for wheezing or shortness of breath.      albuterol (PROVENTIL) (2.5 MG/3ML) 0.083% nebulizer solution Take 3 mLs (2.5 mg total) by nebulization every 6 (six) hours as needed for wheezing or shortness of breath. (Patient not taking: Reported on 05/07/2023) 75 mL 2  amitriptyline (ELAVIL) 50 MG tablet Take 50 mg by mouth at bedtime as needed for sleep.  (Patient not taking: Reported on 05/07/2023)      amLODipine (NORVASC) 5 MG tablet Take 5 mg by mouth every morning.      atorvastatin (LIPITOR) 10 MG tablet Take 10 mg by mouth daily.      Budeson-Glycopyrrol-Formoterol (BREZTRI AEROSPHERE) 160-9-4.8 MCG/ACT AERO Inhale 2 puffs into the lungs in the morning and at bedtime. (Patient not taking: Reported on 05/07/2023) 10.7 g 3    calcium carbonate (TUMS EX) 750 MG chewable tablet Chew 2 tablets by mouth as needed for heartburn. (Patient not taking: Reported on 05/27/2023)      cholecalciferol (VITAMIN D3) 25 MCG (1000 UNIT) tablet Take 1,000 Units by mouth daily. (Patient not taking: Reported on 05/07/2023)      COMBIVENT RESPIMAT 20-100 MCG/ACT AERS respimat Inhale 2 puffs into the lungs every 4 (four) hours as needed. wheezing      cyclobenzaprine (FLEXERIL) 10 MG tablet Take 10 mg by mouth 3 (three) times  daily as needed for muscle spasms.      diazepam (VALIUM) 10 MG tablet Take 10 mg by mouth 3 (three) times daily as needed for anxiety.       divalproex (DEPAKOTE) 250 MG DR tablet Take 250 mg by mouth 3 (three) times daily. (Patient not taking: Reported on 05/27/2023)      docusate sodium (COLACE) 100 MG capsule Take 100 mg by mouth daily. (Patient not taking: Reported on 05/27/2023)      ELIQUIS 5 MG TABS tablet Take 5 mg by mouth 2 (two) times daily.      elvitegravir-cobicistat-emtricitabine-tenofovir (GENVOYA) 150-150-200-10 MG TABS tablet Take 1 tablet by mouth daily with breakfast. (Patient not taking: Reported on 05/27/2023) 30 tablet 5    elvitegravir-cobicistat-emtricitabine-tenofovir (GENVOYA) 150-150-200-10 MG TABS tablet Take 1 tablet by mouth daily with breakfast. (Patient not taking: Reported on 05/27/2023) 7 tablet 0    furosemide (LASIX) 40 MG tablet Take 40 mg by mouth daily as needed.      Multiple Vitamins-Minerals (MULTI FOR HIM 50+) TABS Take 1 tablet by mouth daily. (Patient not taking: Reported on 05/27/2023)      oxyCODONE (OXYCONTIN) 15 mg 12 hr tablet Take 1 tablet (15 mg total) by mouth every 12 (twelve) hours. (Patient not taking: Reported on 05/07/2023) 14 tablet 0    Oxycodone HCl 10 MG TABS Take 10-20 mg by mouth every 6 (six) hours as needed for pain. (Patient not taking: Reported on 05/07/2023)      pantoprazole (PROTONIX) 40 MG tablet Take 40 mg by mouth daily.      pregabalin (LYRICA) 300 MG capsule Take 300 mg by mouth every 12 (twelve) hours. (Patient not taking: Reported on 05/07/2023)      QVAR REDIHALER 80 MCG/ACT inhaler Inhale 1 puff into the lungs 2 (two) times daily.      rosuvastatin (CRESTOR) 5 MG tablet Take 5 mg by mouth daily. (Patient not taking: Reported on 05/27/2023)       Patient Stressors: Health problems   Medication change or noncompliance   Substance abuse    Patient Strengths: Capable of independent living   Treatment Modalities: Medication  Management, Group therapy, Case management,  1 to 1 session with clinician, Psychoeducation, Recreational therapy.   Physician Treatment Plan for Primary Diagnosis: Acute psychosis (HCC) Long Term Goal(s): Improvement in symptoms so as ready for discharge   Short Term Goals: Ability to identify changes in lifestyle  to reduce recurrence of condition will improve Ability to verbalize feelings will improve Ability to disclose and discuss suicidal ideas Ability to demonstrate self-control will improve Ability to identify and develop effective coping behaviors will improve Ability to maintain clinical measurements within normal limits will improve Compliance with prescribed medications will improve Ability to identify triggers associated with substance abuse/mental health issues will improve  Medication Management: Evaluate patient's response, side effects, and tolerance of medication regimen.  Therapeutic Interventions: 1 to 1 sessions, Unit Group sessions and Medication administration.  Evaluation of Outcomes: Not Met  Physician Treatment Plan for Secondary Diagnosis: Principal Problem:   Acute psychosis (HCC)  Long Term Goal(s): Improvement in symptoms so as ready for discharge   Short Term Goals: Ability to identify changes in lifestyle to reduce recurrence of condition will improve Ability to verbalize feelings will improve Ability to disclose and discuss suicidal ideas Ability to demonstrate self-control will improve Ability to identify and develop effective coping behaviors will improve Ability to maintain clinical measurements within normal limits will improve Compliance with prescribed medications will improve Ability to identify triggers associated with substance abuse/mental health issues will improve     Medication Management: Evaluate patient's response, side effects, and tolerance of medication regimen.  Therapeutic Interventions: 1 to 1 sessions, Unit Group sessions  and Medication administration.  Evaluation of Outcomes: Not Met   RN Treatment Plan for Primary Diagnosis: Acute psychosis (HCC) Long Term Goal(s): Knowledge of disease and therapeutic regimen to maintain health will improve  Short Term Goals: Ability to remain free from injury will improve, Ability to verbalize frustration and anger appropriately will improve, Ability to demonstrate self-control, Ability to participate in decision making will improve, Ability to verbalize feelings will improve, Ability to disclose and discuss suicidal ideas, Ability to identify and develop effective coping behaviors will improve, and Compliance with prescribed medications will improve  Medication Management: RN will administer medications as ordered by provider, will assess and evaluate patient's response and provide education to patient for prescribed medication. RN will report any adverse and/or side effects to prescribing provider.  Therapeutic Interventions: 1 on 1 counseling sessions, Psychoeducation, Medication administration, Evaluate responses to treatment, Monitor vital signs and CBGs as ordered, Perform/monitor CIWA, COWS, AIMS and Fall Risk screenings as ordered, Perform wound care treatments as ordered.  Evaluation of Outcomes: Not Met   LCSW Treatment Plan for Primary Diagnosis: Acute psychosis (HCC) Long Term Goal(s): Safe transition to appropriate next level of care at discharge, Engage patient in therapeutic group addressing interpersonal concerns.  Short Term Goals: Engage patient in aftercare planning with referrals and resources, Increase social support, Increase ability to appropriately verbalize feelings, Increase emotional regulation, Facilitate acceptance of mental health diagnosis and concerns, and Increase skills for wellness and recovery  Therapeutic Interventions: Assess for all discharge needs, 1 to 1 time with Social worker, Explore available resources and support systems, Assess  for adequacy in community support network, Educate family and significant other(s) on suicide prevention, Complete Psychosocial Assessment, Interpersonal group therapy.  Evaluation of Outcomes: Not Met   Progress in Treatment: Attending groups: No. Participating in groups: No. Taking medication as prescribed: Yes. Toleration medication: Yes. Family/Significant other contact made: No, will contact:  CSW will contact if given permission  Patient understands diagnosis: No. Discussing patient identified problems/goals with staff: Yes. Medical problems stabilized or resolved: Yes. Denies suicidal/homicidal ideation: Yes. Issues/concerns per patient self-inventory: No. Other: None   New problem(s) identified: No, Describe:  None identified   New Short Term/Long  Term Goal(s): elimination of symptoms of psychosis, medication management for mood stabilization; elimination of SI thoughts; development of comprehensive mental wellness plan.   Patient Goals:  "Trying to get out, the office said I had a gun involved and I didn't, I just want to know why no one believes me"   Discharge Plan or Barriers: CSW will assist with appropriate discharge planning   Reason for Continuation of Hospitalization: Delusions  Hallucinations Medication stabilization  Estimated Length of Stay: 1 to 7 days   Last 3 [place] Suicide Severity Risk Score: Flowsheet Row ED from 05/27/2023 in [facility] Emergency Department at [facility] ED from 05/07/2023 in [facility] Emergency Department at [facility] ED from 10/17/2022 in [facility] Emergency Department at [facility]  C-SSRS RISK CATEGORY No Risk No Risk No Risk       Last PHQ 2/9 Scores:    04/04/2022    3:06 PM 05/17/2021   10:33 AM 05/08/2021    3:15 PM  Depression screen PHQ 2/9  Decreased Interest 0 3 0  Down, Depressed, Hopeless 1 3 1   PHQ - 2 Score 1 6 1   Altered sleeping  3   Tired, decreased energy  3   Change in appetite   3   Feeling bad or failure about yourself   3   Trouble concentrating  3   Moving slowly or fidgety/restless  3   Suicidal thoughts  1   PHQ-9 Score  25   Difficult doing work/chores  Very difficult     Scribe for Treatment Team: Elza Rafter, LCSWA 05/28/2023 11:10 AM

## 2023-05-28 NOTE — Tx Team (Signed)
Initial Treatment Plan 05/28/2023 9:18 AM Devin [name] NFA:[id number]    PATIENT STRESSORS: Health problems   Medication change or noncompliance   Substance abuse     PATIENT STRENGTHS: Capable of independent living    PATIENT IDENTIFIED PROBLEMS:   "My spine is really bad."  "Nothing is wrong with me. It's just my physical problems."                 DISCHARGE CRITERIA:  Ability to meet basic life and health needs Adequate post-discharge living arrangements Improved stabilization in mood, thinking, and/or behavior Medical problems require only outpatient monitoring Safe-care adequate arrangements made Verbal commitment to aftercare and medication compliance  PRELIMINARY DISCHARGE PLAN: Attend aftercare/continuing care group Attend PHP/IOP Return to previous living arrangement  PATIENT/FAMILY INVOLVEMENT: This treatment plan has been presented to and reviewed with the patient, Devin [name]. The patient has been given the opportunity to ask questions and make suggestions.   Luane School, RN 05/28/2023, 9:18 AM

## 2023-05-29 DIAGNOSIS — F23 Brief psychotic disorder: Secondary | ICD-10-CM | POA: Diagnosis not present

## 2023-05-29 MED ORDER — TRAZODONE HCL 100 MG PO TABS
100.0000 mg | ORAL_TABLET | Freq: Every day | ORAL | Status: DC
  Administered 2023-05-29 – 2023-06-02 (×5): 100 mg via ORAL
  Filled 2023-05-29 (×5): qty 1

## 2023-05-29 NOTE — Group Note (Signed)
Date:  05/29/2023 Time:  10:15 AM  Group Topic/Focus:  Making Healthy Choices:   The focus of this group is to help patients identify negative/unhealthy choices they were using prior to admission and identify positive/healthier coping strategies to replace them upon discharge.    Participation Level:  Did Not Attend   Rodena Goldmann 05/29/2023, 10:15 AM

## 2023-05-29 NOTE — Progress Notes (Signed)
[facility] MD Progress Note  05/29/2023 1:36 PM Devin [name]  MRN:  [id number] Subjective: Devin [name] is seen on rounds.  He has been compliant with medications.  He denies any side effects.  He is still having a lot of trouble breathing but has a long history of COPD.  Medically he seems to be worsened psychiatrically.  It is probably playing a role in his delusions.  No delusions on the unit so far.  He says that he is going to stop calling the police. He denies any suicidal ideation. Principal Problem: Acute psychosis (HCC) Diagnosis: Principal Problem:   Acute psychosis (HCC)  Total Time spent with patient: 15 minutes  Past Psychiatric History: Second hospitalization for acute psychosis.  Past Medical History:  Past Medical History:  Diagnosis Date   Anxiety    Arthritis    "I'm eat up w/it" (02/20/2017)   Asthma    Chronic back pain    "the whole back" (02/20/2017)   Chronic bronchitis (HCC)    Complication of anesthesia    "felt like I couldn't breath coming out of it"   COPD (chronic obstructive pulmonary disease) (HCC)    Depression    GERD (gastroesophageal reflux disease)    History of hiatal hernia    History of stomach ulcers    "bleeding ones; I was young then"   HIV infection (HCC) dx'd ~ 1999   Hypertension    Pneumonia    "several times" (02/20/2017)   Prolapsed disk 10/28   Pulmonary embolism (HCC) 02/20/2017   Scoliosis 08/24/13    Past Surgical History:  Procedure Laterality Date   BACK SURGERY  2019   KNEE ARTHROSCOPY Right 1980s   Family History:  Family History  Problem Relation Age of Onset   Diabetes Father    Stroke Other    Colon cancer Neg Hx    Family Psychiatric  History: Unremarkable Social History:  Social History   Substance and Sexual Activity  Alcohol Use Not Currently   Comment: last used about 9 years ago     Social History   Substance and Sexual Activity  Drug Use Yes   Types: Marijuana   Comment: occ    Social History    Socioeconomic History   Marital status: Divorced    Spouse name: Not on file   Number of children: Not on file   Years of education: Not on file   Highest education level: Not on file  Occupational History   Not on file  Tobacco Use   Smoking status: Every Day    Current packs/day: 1.00    Average packs/day: 1 pack/day for 48.0 years (48.0 ttl pk-yrs)    Types: Cigarettes   Smokeless tobacco: Never  Vaping Use   Vaping status: Never Used  Substance and Sexual Activity   Alcohol use: Not Currently    Comment: last used about 9 years ago   Drug use: Yes    Types: Marijuana    Comment: occ   Sexual activity: Yes    Partners: Female    Comment: declined condoms  Other Topics Concern   Not on file  Social History Narrative   Not on file   Social Determinants of Health   Financial Resource Strain: Not on file  Food Insecurity: Food Insecurity Present (05/28/2023)   Hunger Vital Sign    Worried About Running Out of Food in the Last Year: Patient unable to answer    Ran Out of Food in the Last  Year: Sometimes true  Transportation Needs: Unmet Transportation Needs (05/28/2023)   PRAPARE - Administrator, Civil Service (Medical): Yes    Lack of Transportation (Non-Medical): Yes  Physical Activity: Not on file  Stress: Not on file  Social Connections: Not on file   Additional Social History:                         Sleep: Good  Appetite:  Fair  Current Medications: Current Facility-Administered Medications  Medication Dose Route Frequency Provider Last Rate Last Admin   acetaminophen (TYLENOL) tablet 650 mg  650 mg Oral Q6H PRN Jearld Lesch, NP   650 mg at 05/29/23 0911   alum & mag hydroxide-simeth (MAALOX/MYLANTA) 200-200-20 MG/5ML suspension 30 mL  30 mL Oral Q4H PRN Jearld Lesch, NP       amLODipine (NORVASC) tablet 5 mg  5 mg Oral Daily Sarina Ill, DO   5 mg at 05/29/23 1610   apixaban (ELIQUIS) tablet 2.5 mg  2.5 mg Oral  BID Sarina Ill, DO   2.5 mg at 05/29/23 0914   atorvastatin (LIPITOR) tablet 10 mg  10 mg Oral Daily Sarina Ill, DO   10 mg at 05/29/23 9604   diphenhydrAMINE (BENADRYL) capsule 50 mg  50 mg Oral TID PRN Jearld Lesch, NP       Or   diphenhydrAMINE (BENADRYL) injection 50 mg  50 mg Intramuscular TID PRN Jearld Lesch, NP       docusate sodium (COLACE) capsule 100 mg  100 mg Oral BID Sarina Ill, DO   100 mg at 05/29/23 0912   elvitegravir-cobicistat-emtricitabine-tenofovir (GENVOYA) 150-150-200-10 MG tablet 1 tablet  1 tablet Oral Q breakfast Sarina Ill, DO   1 tablet at 05/29/23 0912   furosemide (LASIX) tablet 40 mg  40 mg Oral Daily Iram, Seminario, DO   40 mg at 05/29/23 5409   haloperidol (HALDOL) tablet 5 mg  5 mg Oral TID PRN Jearld Lesch, NP       Or   haloperidol lactate (HALDOL) injection 5 mg  5 mg Intramuscular TID PRN Jearld Lesch, NP       hydrOXYzine (ATARAX) tablet 25 mg  25 mg Oral TID PRN Jearld Lesch, NP   25 mg at 05/28/23 2110   Ipratropium-Albuterol (COMBIVENT) respimat 1 puff  1 puff Inhalation Q6H PRN Sarina Ill, DO       LORazepam (ATIVAN) tablet 2 mg  2 mg Oral TID PRN Jearld Lesch, NP       Or   LORazepam (ATIVAN) injection 2 mg  2 mg Intramuscular TID PRN Jearld Lesch, NP       magnesium hydroxide (MILK OF MAGNESIA) suspension 30 mL  30 mL Oral Daily PRN Jearld Lesch, NP       pantoprazole (PROTONIX) EC tablet 40 mg  40 mg Oral Daily Colburn, Lorraine, DO   40 mg at 05/29/23 0911   potassium chloride SA (KLOR-CON M) CR tablet 20 mEq  20 mEq Oral QPC breakfast Christon, Tinder, DO   20 mEq at 05/29/23 0913   risperiDONE (RISPERDAL) tablet 0.5 mg  0.5 mg Oral BH-q8a4p Sarina Ill, DO   0.5 mg at 05/29/23 0912   traZODone (DESYREL) tablet 100 mg  100 mg Oral QHS PRN Sarina Ill, DO   100 mg at 05/28/23 2110    Lab  Results:  Results  for orders placed or performed during the hospital encounter of 05/28/23 (from the past 48 hour(s))  Valproic acid level     Status: Abnormal   Collection Time: 05/29/23  8:58 AM  Result Value Ref Range   Valproic Acid Lvl <10 (L) 50.0 - 100.0 ug/mL    Comment: Performed at [facility], [address] [place], [place] [address]    Blood Alcohol level:  Lab Results  Component Value Date   [facility] <10 05/27/2023   ETH <10 05/07/2023    Metabolic Disorder Labs: Lab Results  Component Value Date   HGBA1C 5.8 (H) 02/20/2017   MPG 120 02/20/2017   MPG 114 06/13/2010   No results found for: "PROLACTIN" Lab Results  Component Value Date   CHOL 241 (H) 04/04/2022   TRIG 574 (H) 04/04/2022   HDL 36 (L) 04/04/2022   CHOLHDL 6.7 (H) 04/04/2022   VLDL 58 (H) 08/21/2016   LDLCALC  04/04/2022     Comment:     . LDL cholesterol not calculated. Triglyceride levels greater than 400 mg/dL invalidate calculated LDL results. . Reference range: <100 . Desirable range <100 mg/dL for primary prevention;   <70 mg/dL for patients with CHD or diabetic patients  with > or = 2 CHD risk factors. Marland Kitchen LDL-C is now calculated using the Martin-Hopkins  calculation, which is a validated novel method providing  better accuracy than the Friedewald equation in the  estimation of LDL-C.  Horald Pollen et al. Lenox Ahr. 6578;469(62): 2061-2068  (http://education.[employer].com/faq/FAQ164)    LDLCALC 126 (H) 07/12/2020    Physical Findings: AIMS:  , ,  ,  ,    CIWA:    COWS:     Musculoskeletal: Strength & Muscle Tone: within normal limits Gait & Station: normal Patient leans: N/A  Psychiatric Specialty Exam:  Presentation  General Appearance:  Bizarre; Disheveled  Eye Contact: Fleeting  Speech: Pressured  Speech Volume: Normal  Handedness: Right   Mood and Affect  Mood: Anxious; Irritable  Affect: Inappropriate; Full Range   Thought Process  Thought  Processes: Disorganized  Descriptions of Associations:Loose  Orientation:Full (Time, Place and Person)  Thought Content:Illogical  History of Schizophrenia/Schizoaffective disorder:No  Duration of Psychotic Symptoms:N/A  Hallucinations:Hallucinations: None  Ideas of Reference:None  Suicidal Thoughts:Suicidal Thoughts: No  Homicidal Thoughts:Homicidal Thoughts: No   Sensorium  Memory: Immediate Poor  Judgment: Impaired  Insight: Lacking; Shallow   Executive Functions  Concentration: Poor  Attention Span: Poor  Recall: Poor  Fund of Knowledge: Poor  Language: Poor   Psychomotor Activity  Psychomotor Activity: Psychomotor Activity: Normal   Assets  Assets: Communication Skills; Housing   Sleep  Sleep: Sleep: Poor     Blood pressure 129/85, pulse 93, temperature 98.3 F (36.8 C), resp. rate 17, height 6\' 1"  (1.854 m), weight 78.5 kg, SpO2 99%. Body mass index is 22.82 kg/m.   Treatment Plan Summary: Daily contact with patient to assess and evaluate symptoms and progress in treatment, Medication management, and Plan scheduled trazodone awaiting respiratory therapy.  Sarina Ill, DO 05/29/2023, 1:36 PM

## 2023-05-29 NOTE — Plan of Care (Signed)
Assumed care of patient this am, he present A&O x3, tearful, reporting , back pain (8/10), anxiety and  Pt rated his mood "bad". his affect and mood  remain sad and irritable, refusing to  get OOB and requesting narcotics for pain and he was encouraged to discuss with his doctor. Patient is compliant with taking her medications, denies side effect and appetite is poor, have refused to eat breakfast and lunch, but, accepted a nutritional drink and water. Pt  also, expressed a desire to leave the hospital and go and care for his dog. Will continue to monitor for safety, educate on plan of care, encourage to get OOB    Problem: Health Behavior/Discharge Planning: Goal: Ability to manage health-related needs will improve Outcome: Not Progressing   Problem: Clinical Measurements: Goal: Ability to maintain clinical measurements within normal limits will improve Outcome: Not Progressing Goal: Will remain free from infection Outcome: Progressing Goal: Diagnostic test results will improve Outcome: Not Progressing Goal: Respiratory complications will improve Outcome: Not Progressing Goal: Cardiovascular complication will be avoided Outcome: Progressing   Problem: Activity: Goal: Risk for activity intolerance will decrease Outcome: Progressing   Problem: Nutrition: Goal: Adequate nutrition will be maintained Outcome: Not Progressing   Problem: Elimination: Goal: Will not experience complications related to bowel motility Outcome: Progressing Goal: Will not experience complications related to urinary retention Outcome: Progressing   Problem: Pain Managment: Goal: General experience of comfort will improve Outcome: Not Progressing   Problem: Safety: Goal: Ability to remain free from injury will improve Outcome: Progressing   Problem: Skin Integrity: Goal: Risk for impaired skin integrity will decrease Outcome: Progressing  Problem: Education: Goal: Knowledge of General Education  information will improve Description: Including pain rating scale, medication(s)/side effects and non-pharmacologic comfort measures Outcome: Not Progressing  and support according to plan.

## 2023-05-29 NOTE — Group Note (Signed)
Recreation Therapy Group Note   Group Topic:Relaxation  Group Date: 05/29/2023 Start Time: 1400 End Time: 1420 Facilitators: Rosina Lowenstein, LRT, CTRS Location:  Dayroom  Group Description: Meditation. LRT asks patients their current level of stress/anxiety from 1-10, with 10 being the highest. LRT educated on the benefits of mindfulness and how it can apply to everyday life post-discharge. LRT and pt's followed along to an audio script of a "guided meditation" video. LRT asked pt their level of stress and anxiety once the prompt was finished. LRT facilitated post-activity processing to gain feedback on session. Patients then had the option to go outside to the courtyard for fresh air and sunlight.    Goal Area(s) Addressed:  Patient will practice using relaxation technique. Patient will identify a new coping skill.  Patient will follow multistep directions to reduce anxiety and stress.   Affect/Mood: N/A   Participation Level: Did not attend    Clinical Observations/Individualized Feedback: Devin [name] did not attend group.  Plan: Continue to engage patient in RT group sessions 2-3x/week.   Rosina Lowenstein, LRT, CTRS 05/29/2023 2:39 PM

## 2023-05-30 DIAGNOSIS — F23 Brief psychotic disorder: Secondary | ICD-10-CM | POA: Diagnosis not present

## 2023-05-30 LAB — RESPIRATORY PANEL BY PCR

## 2023-05-30 MED ORDER — FLUOXETINE HCL 20 MG PO CAPS
20.0000 mg | ORAL_CAPSULE | Freq: Every day | ORAL | Status: DC
  Administered 2023-05-30 – 2023-06-03 (×5): 20 mg via ORAL
  Filled 2023-05-30 (×5): qty 1

## 2023-05-30 NOTE — Progress Notes (Signed)
[facility] MD Progress Note  05/30/2023 1:48 PM Devin [name]  MRN:  [id number] Subjective: Devin [name] is seen on rounds.  He is affect is improving.  He still delusional and fixates on these people he keeps saying comes into his house.  He thinks they might be underneath his storage space in his yard.  Other than that, no evidence of psychosis.  He has been compliant with medications and states that they have been helpful.  He becomes tearful today for no reason. Principal Problem: Acute psychosis (HCC) Diagnosis: Principal Problem:   Acute psychosis (HCC)  Total Time spent with patient: 15 minutes  Past Psychiatric History: Second hospitalization for delusional disorder  Past Medical History:  Past Medical History:  Diagnosis Date   Anxiety    Arthritis    "I'm eat up w/it" (02/20/2017)   Asthma    Chronic back pain    "the whole back" (02/20/2017)   Chronic bronchitis (HCC)    Complication of anesthesia    "felt like I couldn't breath coming out of it"   COPD (chronic obstructive pulmonary disease) (HCC)    Depression    GERD (gastroesophageal reflux disease)    History of hiatal hernia    History of stomach ulcers    "bleeding ones; I was young then"   HIV infection (HCC) dx'd ~ 1999   Hypertension    Pneumonia    "several times" (02/20/2017)   Prolapsed disk 10/28   Pulmonary embolism (HCC) 02/20/2017   Scoliosis 08/24/13    Past Surgical History:  Procedure Laterality Date   BACK SURGERY  2019   KNEE ARTHROSCOPY Right 1980s   Family History:  Family History  Problem Relation Age of Onset   Diabetes Father    Stroke Other    Colon cancer Neg Hx    Family Psychiatric  History: Unremarkable Social History:  Social History   Substance and Sexual Activity  Alcohol Use Not Currently   Comment: last used about 9 years ago     Social History   Substance and Sexual Activity  Drug Use Yes   Types: Marijuana   Comment: occ    Social History   Socioeconomic History    Marital status: Divorced    Spouse name: Not on file   Number of children: Not on file   Years of education: Not on file   Highest education level: Not on file  Occupational History   Not on file  Tobacco Use   Smoking status: Every Day    Current packs/day: 1.00    Average packs/day: 1 pack/day for 48.0 years (48.0 ttl pk-yrs)    Types: Cigarettes   Smokeless tobacco: Never  Vaping Use   Vaping status: Never Used  Substance and Sexual Activity   Alcohol use: Not Currently    Comment: last used about 9 years ago   Drug use: Yes    Types: Marijuana    Comment: occ   Sexual activity: Yes    Partners: Female    Comment: declined condoms  Other Topics Concern   Not on file  Social History Narrative   Not on file   Social Determinants of Health   Financial Resource Strain: Not on file  Food Insecurity: Food Insecurity Present (05/28/2023)   Hunger Vital Sign    Worried About Running Out of Food in the Last Year: Patient unable to answer    Ran Out of Food in the Last Year: Sometimes true  Transportation Needs: Unmet Transportation  Needs (05/28/2023)   PRAPARE - Administrator, Civil Service (Medical): Yes    Lack of Transportation (Non-Medical): Yes  Physical Activity: Not on file  Stress: Not on file  Social Connections: Not on file   Additional Social History:                         Sleep: Good  Appetite:  Good  Current Medications: Current Facility-Administered Medications  Medication Dose Route Frequency Provider Last Rate Last Admin   acetaminophen (TYLENOL) tablet 650 mg  650 mg Oral Q6H PRN Jearld Lesch, NP   650 mg at 05/29/23 0911   alum & mag hydroxide-simeth (MAALOX/MYLANTA) 200-200-20 MG/5ML suspension 30 mL  30 mL Oral Q4H PRN Jearld Lesch, NP       amLODipine (NORVASC) tablet 5 mg  5 mg Oral Daily Sarina Ill, DO   5 mg at 05/30/23 1008   apixaban (ELIQUIS) tablet 2.5 mg  2.5 mg Oral BID Sarina Ill,  DO   2.5 mg at 05/30/23 1008   atorvastatin (LIPITOR) tablet 10 mg  10 mg Oral Daily Sarina Ill, DO   10 mg at 05/30/23 1008   diphenhydrAMINE (BENADRYL) capsule 50 mg  50 mg Oral TID PRN Jearld Lesch, NP       Or   diphenhydrAMINE (BENADRYL) injection 50 mg  50 mg Intramuscular TID PRN Jearld Lesch, NP       docusate sodium (COLACE) capsule 100 mg  100 mg Oral BID Sarina Ill, DO   100 mg at 05/30/23 1008   elvitegravir-cobicistat-emtricitabine-tenofovir (GENVOYA) 150-150-200-10 MG tablet 1 tablet  1 tablet Oral Q breakfast Sarina Ill, DO   1 tablet at 05/30/23 1008   furosemide (LASIX) tablet 40 mg  40 mg Oral Daily Sarina Ill, DO   40 mg at 05/30/23 1008   haloperidol (HALDOL) tablet 5 mg  5 mg Oral TID PRN Jearld Lesch, NP       Or   haloperidol lactate (HALDOL) injection 5 mg  5 mg Intramuscular TID PRN Jearld Lesch, NP       hydrOXYzine (ATARAX) tablet 25 mg  25 mg Oral TID PRN Jearld Lesch, NP   25 mg at 05/28/23 2110   Ipratropium-Albuterol (COMBIVENT) respimat 1 puff  1 puff Inhalation Q6H PRN Sarina Ill, DO       LORazepam (ATIVAN) tablet 2 mg  2 mg Oral TID PRN Jearld Lesch, NP       Or   LORazepam (ATIVAN) injection 2 mg  2 mg Intramuscular TID PRN Jearld Lesch, NP       magnesium hydroxide (MILK OF MAGNESIA) suspension 30 mL  30 mL Oral Daily PRN Dixon, Rashaun M, NP       pantoprazole (PROTONIX) EC tablet 40 mg  40 mg Oral Daily Jimi, Schappert, DO   40 mg at 05/30/23 1008   potassium chloride SA (KLOR-CON M) CR tablet 20 mEq  20 mEq Oral QPC breakfast Dalten, Ambrosino, DO   20 mEq at 05/30/23 1008   risperiDONE (RISPERDAL) tablet 0.5 mg  0.5 mg Oral BH-[id number] Sarina Ill, DO   0.5 mg at 05/30/23 1007   traZODone (DESYREL) tablet 100 mg  100 mg Oral QHS Sarina Ill, DO   100 mg at 05/29/23 2122    Lab Results:  Results for orders placed or performed  during the hospital encounter of 05/28/23 (from the past 48 hour(s))  Valproic acid level     Status: Abnormal   Collection Time: 05/29/23  8:58 AM  Result Value Ref Range   Valproic Acid Lvl <10 (L) 50.0 - 100.0 ug/mL    Comment: Performed at [facility], [address] [place], [place] [address]    Blood Alcohol level:  Lab Results  Component Value Date   [facility] <10 05/27/2023   ETH <10 05/07/2023    Metabolic Disorder Labs: Lab Results  Component Value Date   HGBA1C 5.8 (H) 02/20/2017   MPG 120 02/20/2017   MPG 114 06/13/2010   No results found for: "PROLACTIN" Lab Results  Component Value Date   CHOL 241 (H) 04/04/2022   TRIG 574 (H) 04/04/2022   HDL 36 (L) 04/04/2022   CHOLHDL 6.7 (H) 04/04/2022   VLDL 58 (H) 08/21/2016   LDLCALC  04/04/2022     Comment:     . LDL cholesterol not calculated. Triglyceride levels greater than 400 mg/dL invalidate calculated LDL results. . Reference range: <100 . Desirable range <100 mg/dL for primary prevention;   <70 mg/dL for patients with CHD or diabetic patients  with > or = 2 CHD risk factors. Marland Kitchen LDL-C is now calculated using the Martin-Hopkins  calculation, which is a validated novel method providing  better accuracy than the Friedewald equation in the  estimation of LDL-C.  Horald Pollen et al. Lenox Ahr. 6045;409(81): 2061-2068  (http://education.[employer].com/faq/FAQ164)    LDLCALC 126 (H) 07/12/2020    Physical Findings: AIMS:  , ,  ,  ,    CIWA:    COWS:     Musculoskeletal: Strength & Muscle Tone: within normal limits Gait & Station: normal Patient leans: N/A  Psychiatric Specialty Exam:  Presentation  General Appearance:  Bizarre; Disheveled  Eye Contact: Fleeting  Speech: Pressured  Speech Volume: Normal  Handedness: Right   Mood and Affect  Mood: Anxious; Irritable  Affect: Inappropriate; Full Range   Thought Process  Thought Processes: Disorganized  Descriptions of  Associations:Loose  Orientation:Full (Time, Place and Person)  Thought Content:Illogical  History of Schizophrenia/Schizoaffective disorder:No  Duration of Psychotic Symptoms:N/A  Hallucinations:No data recorded Ideas of Reference:None  Suicidal Thoughts:No data recorded Homicidal Thoughts:No data recorded  Sensorium  Memory: Immediate Poor  Judgment: Impaired  Insight: Lacking; Shallow   Executive Functions  Concentration: Poor  Attention Span: Poor  Recall: Poor  Fund of Knowledge: Poor  Language: Poor   Psychomotor Activity  Psychomotor Activity:No data recorded  Assets  Assets: Communication Skills; Housing   Sleep  Sleep:No data recorded    Blood pressure 91/60, pulse 88, temperature 98.4 F (36.9 C), resp. rate 19, height 6\' 1"  (1.854 m), weight 78.5 kg, SpO2 96%. Body mass index is 22.82 kg/m.   Treatment Plan Summary: Daily contact with patient to assess and evaluate symptoms and progress in treatment, Medication management, and Plan start Prozac 20 mg/day.  Sarina Ill, DO 05/30/2023, 1:48 PM

## 2023-05-30 NOTE — Progress Notes (Signed)
   05/29/23 2200  Psych Admission Type (Psych Patients Only)  Admission Status Involuntary  Psychosocial Assessment  Patient Complaints Worrying  Eye Contact Brief  Facial Expression Anxious  Affect Preoccupied  Speech Pressured  Interaction Assertive  Motor Activity Unsteady  Appearance/Hygiene In scrubs  Behavior Characteristics Cooperative  Mood Apprehensive  Thought Process  Coherency Disorganized  Content Blaming others  Delusions None reported or observed  Perception WDL  Hallucination None reported or observed  Judgment Impaired  Confusion Mild  Danger to Self  Current suicidal ideation? Denies  Description of Agreement Verbal  Danger to Others  Danger to Others None reported or observed
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
## 2023-05-30 NOTE — Progress Notes (Signed)
This RT called by RN to complete Consult. Pt laying in bed on RA with sat mid 90's with diminished exp wheezing. Pt has a productive cough of thick green sputum.   Pt takes Breztri at home, I will request MD to have Pharmacist order [facility] replacement. Also recommend Sputum be sent out for C&S and COVID screening.

## 2023-05-30 NOTE — Plan of Care (Signed)
  Problem: Nutrition: Goal: Adequate nutrition will be maintained Outcome: Progressing   Problem: Coping: Goal: Level of anxiety will decrease Outcome: Progressing  Patient compliant with treatment plan interacting well with Peers and Staff Denies SI/HI/A/VH and verbally contracts for safety endorses Chronic Back prn Tylenol given Patient stated it does not  really help  much. Patient has been coughing reported having smoked since the age of 12 Prn Combivent for wheezing give. Patient was given a wheel chair refused a walker due to bilateral arm weakness. All fall protocol in place. Support and encouragement provided.

## 2023-05-30 NOTE — Group Note (Signed)
Recreation Therapy Group Note   Group Topic:Other  Group Date: 05/30/2023 Start Time: 0900 End Time: 0955 Facilitators: Rosina Lowenstein, LRT, CTRS Location: Courtyard  Group Description: Music Reminisce. LRT encouraged patients to think of their favorite song(s) that reminded them of a positive memory or time in their life. LRT encouraged patient to talk about that memory aloud to the group. LRT played the song through a speaker for all to hear. LRT and patients discussed how thinking of a positive memory or time in their life can be used as a coping skill in everyday life post discharge.    Goal Area(s) Addressed: Patient will increase verbal communication by conversing with peers. Patient will contribute to group discussion with minimal prompting. Patient will reminisce a positive memory or moment in their life.    Affect/Mood: N/A   Participation Level: Did not attend    Clinical Observations/Individualized Feedback: Devin [name] did not attend group.  Plan: Continue to engage patient in RT group sessions 2-3x/week.   Rosina Lowenstein, LRT, CTRS 05/30/2023 11:58 AM

## 2023-05-30 NOTE — Plan of Care (Signed)

## 2023-05-30 NOTE — Group Note (Signed)
LCSW Group Therapy Note   Group Date: 05/30/2023 Start Time: 1315 End Time: 1345   Type of Therapy and Topic:  Group Therapy: Boundaries  Participation Level:  Did Not Attend  Description of Group: This group will address the use of boundaries in their personal lives. Patients will explore why boundaries are important, the difference between healthy and unhealthy boundaries, and negative and postive outcomes of different boundaries and will look at how boundaries can be crossed.  Patients will be encouraged to identify current boundaries in their own lives and identify what kind of boundary is being set. Facilitators will guide patients in utilizing problem-solving interventions to address and correct types boundaries being used and to address when no boundary is being used. Understanding and applying boundaries will be explored and addressed for obtaining and maintaining a balanced life. Patients will be encouraged to explore ways to assertively make their boundaries and needs known to significant others in their lives, using other group members and facilitator for role play, support, and feedback.  Therapeutic Goals:  1.  Patient will identify areas in their life where setting clear boundaries could be  used to improve their life.  2.  Patient will identify signs/triggers that a boundary is not being respected. 3.  Patient will identify two ways to set boundaries in order to achieve balance in  their lives: 4.  Patient will demonstrate ability to communicate their needs and set boundaries  through discussion and/or role plays  Summary of Patient Progress:  X  Therapeutic Modalities:   Cognitive Behavioral Therapy Solution-Focused Therapy  Elza Rafter, LCSWA 05/30/2023  2:00 PM

## 2023-05-30 NOTE — BH Assessment (Signed)
Recreation Therapy Notes  INPATIENT RECREATION THERAPY ASSESSMENT  Patient Details Name: Devin [name] MRN: [id number] DOB: 20-May-1961 Today's Date: 05/30/2023       Able to Participate in Assessment/Interview: No   Salina April LRT, CTRS Devin [name] 05/30/2023, 12:06 PM

## 2023-05-30 NOTE — Plan of Care (Signed)

## 2023-05-30 NOTE — Group Note (Signed)
Date:  05/30/2023 Time:  10:21 PM  Group Topic/Focus:  Building Self Esteem:   The Focus of this group is helping patients become aware of the effects of self-esteem on their lives, the things they and others do that enhance or undermine their self-esteem, seeing the relationship between their level of self-esteem and the choices they make and learning ways to enhance self-esteem. Goals Group:   The focus of this group is to help patients establish daily goals to achieve during treatment and discuss how the patient can incorporate goal setting into their daily lives to aide in recovery. Identifying Needs:   The focus of this group is to help patients identify their personal needs that have been historically problematic and identify healthy behaviors to address their needs. Making Healthy Choices:   The focus of this group is to help patients identify negative/unhealthy choices they were using prior to admission and identify positive/healthier coping strategies to replace them upon discharge. Personal Choices and Values:   The focus of this group is to help patients assess and explore the importance of values in their lives, how their values affect their decisions, how they express their values and what opposes their expression. Self Care:   The focus of this group is to help patients understand the importance of self-care in order to improve or restore emotional, physical, spiritual, interpersonal, and financial health.    Participation Level:  Active  Participation Quality:  Appropriate  Affect:  Appropriate  Cognitive:  Appropriate  Insight: Appropriate  Engagement in Group:  Developing/Improving  Modes of Intervention:  Discussion  Additional Comments:    Maeola Harman 05/30/2023, 10:21 PM

## 2023-05-30 NOTE — Group Note (Signed)
Date:  05/30/2023 Time:  3:50 PM  Group Topic/Focus:  Goals Group:   The focus of this group is to help patients establish daily goals to achieve during treatment and discuss how the patient can incorporate goal setting into their daily lives to aide in recovery.    Participation Level:  Did Not Attend    [name] 05/30/2023, 3:50 PM

## 2023-05-31 DIAGNOSIS — F23 Brief psychotic disorder: Secondary | ICD-10-CM | POA: Diagnosis not present

## 2023-05-31 MED ORDER — LORAZEPAM 1 MG PO TABS
1.0000 mg | ORAL_TABLET | ORAL | Status: DC | PRN
  Administered 2023-06-03: 1 mg via ORAL
  Filled 2023-05-31 (×2): qty 1

## 2023-05-31 MED ORDER — RISPERIDONE 1 MG PO TABS
1.0000 mg | ORAL_TABLET | Freq: Every day | ORAL | Status: DC
  Administered 2023-05-31 – 2023-06-02 (×3): 1 mg via ORAL
  Filled 2023-05-31 (×3): qty 1

## 2023-05-31 MED ORDER — GABAPENTIN 100 MG PO CAPS
100.0000 mg | ORAL_CAPSULE | Freq: Three times a day (TID) | ORAL | Status: DC
  Administered 2023-05-31 – 2023-06-02 (×6): 100 mg via ORAL
  Filled 2023-05-31 (×6): qty 1

## 2023-05-31 NOTE — Plan of Care (Signed)
  Problem: Education: Goal: Knowledge of General Education information will improve Description: Including pain rating scale, medication(s)/side effects and non-pharmacologic comfort measures Outcome: Progressing   Problem: Nutrition: Goal: Adequate nutrition will be maintained Outcome: Progressing   Problem: Coping: Goal: Level of anxiety will decrease Outcome: Progressing   

## 2023-05-31 NOTE — Group Note (Signed)
Date:  05/31/2023 Time:  10:50 PM  Group Topic/Focus:  Conflict Resolution:   The focus of this group is to discuss the conflict resolution process and how it may be used upon discharge. Crisis Planning:   The purpose of this group is to help patients create a crisis plan for use upon discharge or in the future, as needed. Dimensions of Wellness:   The focus of this group is to introduce the topic of wellness and discuss the role each dimension of wellness plays in total health.    Participation Level:  Active  Participation Quality:  Appropriate  Affect:  Appropriate  Cognitive:  Appropriate  Insight: Improving  Engagement in Group:  Engaged  Modes of Intervention:  Discussion  Additional Comments:    Devin [name] 05/31/2023, 10:50 PM

## 2023-05-31 NOTE — Progress Notes (Signed)
[facility] MD Progress Note  05/31/2023 12:43 PM Devin [name]  MRN:  [id number] Subjective: Respiratory therapy recommended getting a pulmonary panel on Devin [name].  All of this negative but is COVID is not back yet.  He looks better and states that he is feeling better.  Still delusional about visible people coming into his house.  Been in good controls and compliant with medications Principal Problem: Acute psychosis (HCC) Diagnosis: Principal Problem:   Acute psychosis (HCC)  Total Time spent with patient: 15 minutes  Past Psychiatric History: Delusional disorder  Past Medical History:  Past Medical History:  Diagnosis Date   Anxiety    Arthritis    "I'm eat up w/it" (02/20/2017)   Asthma    Chronic back pain    "the whole back" (02/20/2017)   Chronic bronchitis (HCC)    Complication of anesthesia    "felt like I couldn't breath coming out of it"   COPD (chronic obstructive pulmonary disease) (HCC)    Depression    GERD (gastroesophageal reflux disease)    History of hiatal hernia    History of stomach ulcers    "bleeding ones; I was young then"   HIV infection (HCC) dx'd ~ 1999   Hypertension    Pneumonia    "several times" (02/20/2017)   Prolapsed disk 10/28   Pulmonary embolism (HCC) 02/20/2017   Scoliosis 08/24/13    Past Surgical History:  Procedure Laterality Date   BACK SURGERY  2019   KNEE ARTHROSCOPY Right 1980s   Family History:  Family History  Problem Relation Age of Onset   Diabetes Father    Stroke Other    Colon cancer Neg Hx    Family Psychiatric  History: 1 previous admission Social History:  Social History   Substance and Sexual Activity  Alcohol Use Not Currently   Comment: last used about 9 years ago     Social History   Substance and Sexual Activity  Drug Use Yes   Types: Marijuana   Comment: occ    Social History   Socioeconomic History   Marital status: Divorced    Spouse name: Not on file   Number of children: Not on file   Years of  education: Not on file   Highest education level: Not on file  Occupational History   Not on file  Tobacco Use   Smoking status: Every Day    Current packs/day: 1.00    Average packs/day: 1 pack/day for 48.0 years (48.0 ttl pk-yrs)    Types: Cigarettes   Smokeless tobacco: Never  Vaping Use   Vaping status: Never Used  Substance and Sexual Activity   Alcohol use: Not Currently    Comment: last used about 9 years ago   Drug use: Yes    Types: Marijuana    Comment: occ   Sexual activity: Yes    Partners: Female    Comment: declined condoms  Other Topics Concern   Not on file  Social History Narrative   Not on file   Social Determinants of Health   Financial Resource Strain: Not on file  Food Insecurity: Food Insecurity Present (05/28/2023)   Hunger Vital Sign    Worried About Running Out of Food in the Last Year: Patient unable to answer    Ran Out of Food in the Last Year: Sometimes true  Transportation Needs: Unmet Transportation Needs (05/28/2023)   PRAPARE - Transportation    Lack of Transportation (Medical): Yes    Lack of  Transportation (Non-Medical): Yes  Physical Activity: Not on file  Stress: Not on file  Social Connections: Not on file   Additional Social History:                         Sleep: Good  Appetite: Good  Current Medications: Current Facility-Administered Medications  Medication Dose Route Frequency Provider Last Rate Last Admin   acetaminophen (TYLENOL) tablet 650 mg  650 mg Oral Q6H PRN Jearld Lesch, NP   650 mg at 05/30/23 2117   alum & mag hydroxide-simeth (MAALOX/MYLANTA) 200-200-20 MG/5ML suspension 30 mL  30 mL Oral Q4H PRN Jearld Lesch, NP       amLODipine (NORVASC) tablet 5 mg  5 mg Oral Daily Sarina Ill, DO   5 mg at 05/31/23 1014   apixaban (ELIQUIS) tablet 2.5 mg  2.5 mg Oral BID Sarina Ill, DO   2.5 mg at 05/31/23 1014   atorvastatin (LIPITOR) tablet 10 mg  10 mg Oral Daily Sarina Ill, DO   10 mg at 05/31/23 1015   diphenhydrAMINE (BENADRYL) capsule 50 mg  50 mg Oral TID PRN Jearld Lesch, NP       Or   diphenhydrAMINE (BENADRYL) injection 50 mg  50 mg Intramuscular TID PRN Durwin Nora, Rashaun M, NP       docusate sodium (COLACE) capsule 100 mg  100 mg Oral BID Sarina Ill, DO   100 mg at 05/30/23 2117   elvitegravir-cobicistat-emtricitabine-tenofovir (GENVOYA) 150-150-200-10 MG tablet 1 tablet  1 tablet Oral Q breakfast Sarina Ill, DO   1 tablet at 05/31/23 5638   FLUoxetine (PROZAC) capsule 20 mg  20 mg Oral Daily Sarina Ill, DO   20 mg at 05/31/23 1014   furosemide (LASIX) tablet 40 mg  40 mg Oral Daily Sarina Ill, DO   40 mg at 05/31/23 1014   haloperidol (HALDOL) tablet 5 mg  5 mg Oral TID PRN Jearld Lesch, NP       Or   haloperidol lactate (HALDOL) injection 5 mg  5 mg Intramuscular TID PRN Jearld Lesch, NP       hydrOXYzine (ATARAX) tablet 25 mg  25 mg Oral TID PRN Jearld Lesch, NP   25 mg at 05/30/23 2117   Ipratropium-Albuterol (COMBIVENT) respimat 1 puff  1 puff Inhalation Q6H PRN Sarina Ill, DO   1 puff at 05/30/23 2120   LORazepam (ATIVAN) tablet 2 mg  2 mg Oral TID PRN Jearld Lesch, NP       Or   LORazepam (ATIVAN) injection 2 mg  2 mg Intramuscular TID PRN Jearld Lesch, NP       magnesium hydroxide (MILK OF MAGNESIA) suspension 30 mL  30 mL Oral Daily PRN Dixon, Rashaun M, NP       pantoprazole (PROTONIX) EC tablet 40 mg  40 mg Oral Daily Shameek, Nyquist, DO   40 mg at 05/31/23 1014   potassium chloride SA (KLOR-CON M) CR tablet 20 mEq  20 mEq Oral QPC breakfast Melvern, Ramone, DO   20 mEq at 05/31/23 7564   risperiDONE (RISPERDAL) tablet 0.5 mg  0.5 mg Oral [id number] Sarina Ill, DO   0.5 mg at 05/31/23 3329   traZODone (DESYREL) tablet 100 mg  100 mg Oral QHS Sarina Ill, DO   100 mg at 05/30/23 2117    Lab Results:   Results  for orders placed or performed during the hospital encounter of 05/28/23 (from the past 48 hour(s))  Respiratory (~20 pathogens) panel by PCR     Status: None   Collection Time: 05/30/23  4:10 PM  Result Value Ref Range   Adenovirus NOT DETECTED NOT DETECTED   Coronavirus 229E NOT DETECTED NOT DETECTED    Comment: (NOTE) The Coronavirus on the Respiratory Panel, DOES NOT test for the novel  Coronavirus (2019 nCoV)    Coronavirus HKU1 NOT DETECTED NOT DETECTED   Coronavirus NL63 NOT DETECTED NOT DETECTED   Coronavirus OC43 NOT DETECTED NOT DETECTED   Metapneumovirus NOT DETECTED NOT DETECTED   Rhinovirus / Enterovirus NOT DETECTED NOT DETECTED   Influenza A NOT DETECTED NOT DETECTED   Influenza B NOT DETECTED NOT DETECTED   Parainfluenza Virus 1 NOT DETECTED NOT DETECTED   Parainfluenza Virus 2 NOT DETECTED NOT DETECTED   Parainfluenza Virus 3 NOT DETECTED NOT DETECTED   Parainfluenza Virus 4 NOT DETECTED NOT DETECTED   Respiratory Syncytial Virus NOT DETECTED NOT DETECTED   Bordetella pertussis NOT DETECTED NOT DETECTED   Bordetella Parapertussis NOT DETECTED NOT DETECTED   Chlamydophila pneumoniae NOT DETECTED NOT DETECTED   Mycoplasma pneumoniae NOT DETECTED NOT DETECTED    Comment: Performed at [facility], [address], [place], [place] [address]    Blood Alcohol level:  Lab Results  Component Value Date   [facility] <10 05/27/2023   ETH <10 05/07/2023    Metabolic Disorder Labs: Lab Results  Component Value Date   HGBA1C 5.8 (H) 02/20/2017   MPG 120 02/20/2017   MPG 114 06/13/2010   No results found for: "PROLACTIN" Lab Results  Component Value Date   CHOL 241 (H) 04/04/2022   TRIG 574 (H) 04/04/2022   HDL 36 (L) 04/04/2022   CHOLHDL 6.7 (H) 04/04/2022   VLDL 58 (H) 08/21/2016   LDLCALC  04/04/2022     Comment:     . LDL cholesterol not calculated. Triglyceride levels greater than 400 mg/dL invalidate calculated LDL results. . Reference range:  <100 . Desirable range <100 mg/dL for primary prevention;   <70 mg/dL for patients with CHD or diabetic patients  with > or = 2 CHD risk factors. Marland Kitchen LDL-C is now calculated using the Martin-Hopkins  calculation, which is a validated novel method providing  better accuracy than the Friedewald equation in the  estimation of LDL-C.  Horald Pollen et al. Lenox Ahr. 6045;409(81): 2061-2068  (http://education.[facility].com/faq/FAQ164)    LDLCALC 126 (H) 07/12/2020    Physical Findings: AIMS:  , ,  ,  ,    CIWA:    COWS:     Musculoskeletal: Strength & Muscle Tone: Good Gait & Station: Normal Patient leans: N/A  Psychiatric Specialty Exam:  Presentation  General Appearance:  Bizarre; Disheveled  Eye Contact: Fleeting  Speech: Pressured  Speech Volume: Normal  Handedness: Right   Mood and Affect  Mood: Anxious; Irritable  Affect: Inappropriate; Full Range   Thought Process  Thought Processes: Disorganized  Descriptions of Associations:Loose  Orientation:Full (Time, Place and Person)  Thought Content:Illogical  History of Schizophrenia/Schizoaffective disorder:No  Duration of Psychotic Symptoms:N/A  Hallucinations:No data recorded Ideas of Reference:None  Suicidal Thoughts:No data recorded Homicidal Thoughts:No data recorded  Sensorium  Memory: Immediate Poor  Judgment: Impaired  Insight: Lacking; Shallow   Executive Functions  Concentration: Poor  Attention Span: Poor  Recall: Poor  Fund of Knowledge: Poor  Language: Poor   Psychomotor Activity  Psychomotor Activity:No data  recorded  Assets  Assets: Communication Skills; Housing   Sleep  Sleep:No data recorded    Blood pressure 115/72, pulse 93, temperature 99.5 F (37.5 C), resp. rate 18, height 6\' 1"  (1.854 m), weight 78.5 kg, SpO2 100%. Body mass index is 22.82 kg/m.   Treatment Plan Summary: Daily contact with patient to assess and evaluate symptoms and  progress in treatment, Medication management, and Plan continue current medications.  Sarina Ill, DO 05/31/2023, 12:43 PM

## 2023-05-31 NOTE — Group Note (Signed)
[facility] LCSW Group Therapy Note   Group Date: 05/31/2023 Start Time: 1310 End Time: 1400   Type of Therapy/Topic:  Group Therapy:  Balance in Life  Participation Level:  Did Not Attend   Description of Group:    This group will address the concept of balance and how it feels and looks when one is unbalanced. Patients will be encouraged to process areas in their lives that are out of balance, and identify reasons for remaining unbalanced. Facilitators will guide patients utilizing problem- solving interventions to address and correct the stressor making their life unbalanced. Understanding and applying boundaries will be explored and addressed for obtaining  and maintaining a balanced life. Patients will be encouraged to explore ways to assertively make their unbalanced needs known to significant others in their lives, using other group members and facilitator for support and feedback.  Therapeutic Goals: Patient will identify two or more emotions or situations they have that consume much of in their lives. Patient will identify signs/triggers that life has become out of balance:  Patient will identify two ways to set boundaries in order to achieve balance in their lives:  Patient will demonstrate ability to communicate their needs through discussion and/or role plays  Summary of Patient Progress: Patient did not attend group.    Marshell Levan, LCSW

## 2023-05-31 NOTE — Group Note (Unsigned)
Date:  05/31/2023 Time:  2:36 PM  Group Topic/Focus:  Listen to music and socializing     Participation Level:  {BHH PARTICIPATION VOZDG:64403}  Participation Quality:  {BHH PARTICIPATION QUALITY:22265}  Affect:  {BHH AFFECT:22266}  Cognitive:  {BHH COGNITIVE:22267}  Insight: {BHH Insight2:20797}  Engagement in Group:  {BHH ENGAGEMENT IN KVQQV:95638}  Modes of Intervention:  {BHH MODES OF INTERVENTION:22269}  Additional Comments:  ***  Rodena Goldmann 05/31/2023, 2:36 PM

## 2023-05-31 NOTE — Progress Notes (Signed)
Patient is present in the dayroom for breakfast.  Irritable, flat affect.  Patient repots he did not sleep well due to his back pain (9/10).  Also reports no BM since arriving on the unit.  Per patient, this is because the food portions are too small.  Denies SI,HI and AVH.  Compliant with scheduled medications. 15 min checks in place for safety.   Patient isolates to room with the exception of meals. Minimal interaction with peers.

## 2023-06-01 DIAGNOSIS — F23 Brief psychotic disorder: Secondary | ICD-10-CM | POA: Diagnosis not present

## 2023-06-01 LAB — CBC
HCT: 40.2 % (ref 39.0–52.0)
Hemoglobin: 13.7 g/dL (ref 13.0–17.0)
MCH: 31.9 pg (ref 26.0–34.0)
MCHC: 34.1 g/dL (ref 30.0–36.0)
MCV: 93.7 fL (ref 80.0–100.0)
Platelets: 223 10*3/uL (ref 150–400)
RBC: 4.29 MIL/uL (ref 4.22–5.81)
RDW: 11.2 % — ABNORMAL LOW (ref 11.5–15.5)
WBC: 4.9 10*3/uL (ref 4.0–10.5)
nRBC: 0 % (ref 0.0–0.2)

## 2023-06-01 NOTE — Plan of Care (Signed)
  Problem: Education: Goal: Knowledge of General Education information will improve Description: Including pain rating scale, medication(s)/side effects and non-pharmacologic comfort measures Outcome: Progressing   Problem: Safety: Goal: Ability to remain free from injury will improve Outcome: Progressing   Problem: Skin Integrity: Goal: Risk for impaired skin integrity will decrease Outcome: Progressing   

## 2023-06-01 NOTE — Progress Notes (Signed)
Patient continues to use wheelchair (with staff assistance) to move around the unit.  Reports poor sleep.  Pain rated 9/10 in his back and head.  Patient also complains of dizziness.  Dr. Marlou Porch made aware.  Patient denies SI/HI and AVH.  Isolates to room with the exception of meals.  Compliant with scheduled medications.  15 min checks in place for safety.  Patient joined MHT group to go outside for fresh air.  Pleasant interactions with staff and peers.

## 2023-06-01 NOTE — Group Note (Signed)
Date:  06/01/2023 Time:  11:08 PM  Group Topic/Focus:  Building Self Esteem:   The Focus of this group is helping patients become aware of the effects of self-esteem on their lives, the things they and others do that enhance or undermine their self-esteem, seeing the relationship between their level of self-esteem and the choices they make and learning ways to enhance self-esteem. Coping With Mental Health Crisis:   The purpose of this group is to help patients identify strategies for coping with mental health crisis.  Group discusses possible causes of crisis and ways to manage them effectively. Developing a Wellness Toolbox:   The focus of this group is to help patients develop a "wellness toolbox" with skills and strategies to promote recovery upon discharge. Goals Group:   The focus of this group is to help patients establish daily goals to achieve during treatment and discuss how the patient can incorporate goal setting into their daily lives to aide in recovery. Healthy Communication:   The focus of this group is to discuss communication, barriers to communication, as well as healthy ways to communicate with others. Making Healthy Choices:   The focus of this group is to help patients identify negative/unhealthy choices they were using prior to admission and identify positive/healthier coping strategies to replace them upon discharge. Self Care:   The focus of this group is to help patients understand the importance of self-care in order to improve or restore emotional, physical, spiritual, interpersonal, and financial health. Self Esteem Action Plan:   The focus of this group is to help patients create a plan to continue to build self-esteem after discharge. Wellness Toolbox:   The focus of this group is to discuss various aspects of wellness, balancing those aspects and exploring ways to increase the ability to experience wellness.  Patients will create a wellness toolbox for use upon  discharge.    Participation Level:  Active  Participation Quality:  Appropriate  Affect:  Appropriate  Cognitive:  Appropriate  Insight: Appropriate  Engagement in Group:  Developing/Improving  Modes of Intervention:  Discussion  Additional Comments:    Maeola Harman 06/01/2023, 11:08 PM
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
## 2023-06-01 NOTE — Progress Notes (Signed)
Patient has been given clean scrubs and supplies for a shower.  Multiple staff members have encouraged patient to shower and offered assistance.   Patient keeps stating he cannot shower until he knows when he will be discharged.

## 2023-06-01 NOTE — Progress Notes (Signed)
[facility] MD Progress Note  06/01/2023 11:12 AM Devin [name]  MRN:  [id number] Subjective: Devin [name] is seen on rounds.  He continues to complain of all body pain.  He is still delusional about people in his house.  He has been compliant with medications.  He has been in good controls and no problems on the unit.  He remains depressed and isolative to his room. Principal Problem: Acute psychosis (HCC) Diagnosis: Principal Problem:   Acute psychosis (HCC)  Total Time spent with patient: 15 minutes  Past Psychiatric History: 1 previous admission for delusions of similar nature  Past Medical History:  Past Medical History:  Diagnosis Date   Anxiety    Arthritis    "I'm eat up w/it" (02/20/2017)   Asthma    Chronic back pain    "the whole back" (02/20/2017)   Chronic bronchitis (HCC)    Complication of anesthesia    "felt like I couldn't breath coming out of it"   COPD (chronic obstructive pulmonary disease) (HCC)    Depression    GERD (gastroesophageal reflux disease)    History of hiatal hernia    History of stomach ulcers    "bleeding ones; I was young then"   HIV infection (HCC) dx'd ~ 1999   Hypertension    Pneumonia    "several times" (02/20/2017)   Prolapsed disk 10/28   Pulmonary embolism (HCC) 02/20/2017   Scoliosis 08/24/13    Past Surgical History:  Procedure Laterality Date   BACK SURGERY  2019   KNEE ARTHROSCOPY Right 1980s   Family History:  Family History  Problem Relation Age of Onset   Diabetes Father    Stroke Other    Colon cancer Neg Hx    Family Psychiatric  History: Unremarkable Social History:  Social History   Substance and Sexual Activity  Alcohol Use Not Currently   Comment: last used about 9 years ago     Social History   Substance and Sexual Activity  Drug Use Yes   Types: Marijuana   Comment: occ    Social History   Socioeconomic History   Marital status: Divorced    Spouse name: Not on file   Number of children: Not on file   Years of  education: Not on file   Highest education level: Not on file  Occupational History   Not on file  Tobacco Use   Smoking status: Every Day    Current packs/day: 1.00    Average packs/day: 1 pack/day for 48.0 years (48.0 ttl pk-yrs)    Types: Cigarettes   Smokeless tobacco: Never  Vaping Use   Vaping status: Never Used  Substance and Sexual Activity   Alcohol use: Not Currently    Comment: last used about 9 years ago   Drug use: Yes    Types: Marijuana    Comment: occ   Sexual activity: Yes    Partners: Female    Comment: declined condoms  Other Topics Concern   Not on file  Social History Narrative   Not on file   Social Determinants of Health   Financial Resource Strain: Not on file  Food Insecurity: Food Insecurity Present (05/28/2023)   Hunger Vital Sign    Worried About Running Out of Food in the Last Year: Patient unable to answer    Ran Out of Food in the Last Year: Sometimes true  Transportation Needs: Unmet Transportation Needs (05/28/2023)   PRAPARE - Administrator, Civil Service (Medical):  Yes    Lack of Transportation (Non-Medical): Yes  Physical Activity: Not on file  Stress: Not on file  Social Connections: Not on file   Additional Social History:                         Sleep: Good  Appetite:  Good  Current Medications: Current Facility-Administered Medications  Medication Dose Route Frequency Provider Last Rate Last Admin   acetaminophen (TYLENOL) tablet 650 mg  650 mg Oral Q6H PRN Jearld Lesch, NP   650 mg at 05/31/23 2119   alum & mag hydroxide-simeth (MAALOX/MYLANTA) 200-200-20 MG/5ML suspension 30 mL  30 mL Oral Q4H PRN Dixon, Rashaun M, NP       amLODipine (NORVASC) tablet 5 mg  5 mg Oral Daily Sarina Ill, DO   5 mg at 06/01/23 0981   apixaban (ELIQUIS) tablet 2.5 mg  2.5 mg Oral BID Sarina Ill, DO   2.5 mg at 06/01/23 1914   atorvastatin (LIPITOR) tablet 10 mg  10 mg Oral Daily Billyjoe, Go, DO   10 mg at 06/01/23 7829   docusate sodium (COLACE) capsule 100 mg  100 mg Oral BID Sarina Ill, DO   100 mg at 06/01/23 5621   elvitegravir-cobicistat-emtricitabine-tenofovir (GENVOYA) 150-150-200-10 MG tablet 1 tablet  1 tablet Oral Q breakfast Caid, Radin, DO   1 tablet at 06/01/23 3086   FLUoxetine (PROZAC) capsule 20 mg  20 mg Oral Daily Willman, Cuny, DO   20 mg at 06/01/23 5784   furosemide (LASIX) tablet 40 mg  40 mg Oral Daily Donal, Lynam, DO   40 mg at 06/01/23 0908   gabapentin (NEURONTIN) capsule 100 mg  100 mg Oral TID Sarina Ill, DO   100 mg at 06/01/23 6962   Ipratropium-Albuterol (COMBIVENT) respimat 1 puff  1 puff Inhalation Q6H PRN Sarina Ill, DO   1 puff at 06/01/23 0911   LORazepam (ATIVAN) tablet 1 mg  1 mg Oral Q4H PRN Sarina Ill, DO       magnesium hydroxide (MILK OF MAGNESIA) suspension 30 mL  30 mL Oral Daily PRN Jearld Lesch, NP       pantoprazole (PROTONIX) EC tablet 40 mg  40 mg Oral Daily Demichael, Traum, DO   40 mg at 06/01/23 0906   potassium chloride SA (KLOR-CON M) CR tablet 20 mEq  20 mEq Oral QPC breakfast Park, Beck, DO   20 mEq at 06/01/23 0906   risperiDONE (RISPERDAL) tablet 0.5 mg  0.5 mg Oral [id number] Jakeob, Tullis, DO   0.5 mg at 06/01/23 9528   risperiDONE (RISPERDAL) tablet 1 mg  1 mg Oral QHS Sarina Ill, DO   1 mg at 05/31/23 2119   traZODone (DESYREL) tablet 100 mg  100 mg Oral QHS Sarina Ill, DO   100 mg at 05/31/23 2119    Lab Results:  Results for orders placed or performed during the hospital encounter of 05/28/23 (from the past 48 hour(s))  MTB-RIF NAA with AFB Culture, sputum     Status: None (Preliminary result)   Collection Time: 05/30/23  4:04 PM   Specimen: Sputum  Result Value Ref Range   Myco tuberculosis Complex PENDING    Rifampin PENDING    AFB Specimen Processing  Concentration     Comment: (NOTE) Performed At: [facility] [address] [place], [place] [id number] Jolene Schimke MD  ZO:[id number]    Acid Fast Culture PENDING    Source (MTB RIF) SPUTUM     Comment: Performed at [facility], [address] [place], [place] [address]  Respiratory (~20 pathogens) panel by PCR     Status: None   Collection Time: 05/30/23  4:10 PM  Result Value Ref Range   Adenovirus NOT DETECTED NOT DETECTED   Coronavirus 229E NOT DETECTED NOT DETECTED    Comment: (NOTE) The Coronavirus on the Respiratory Panel, DOES NOT test for the novel  Coronavirus (2019 nCoV)    Coronavirus HKU1 NOT DETECTED NOT DETECTED   Coronavirus NL63 NOT DETECTED NOT DETECTED   Coronavirus OC43 NOT DETECTED NOT DETECTED   Metapneumovirus NOT DETECTED NOT DETECTED   Rhinovirus / Enterovirus NOT DETECTED NOT DETECTED   Influenza A NOT DETECTED NOT DETECTED   Influenza B NOT DETECTED NOT DETECTED   Parainfluenza Virus 1 NOT DETECTED NOT DETECTED   Parainfluenza Virus 2 NOT DETECTED NOT DETECTED   Parainfluenza Virus 3 NOT DETECTED NOT DETECTED   Parainfluenza Virus 4 NOT DETECTED NOT DETECTED   Respiratory Syncytial Virus NOT DETECTED NOT DETECTED   Bordetella pertussis NOT DETECTED NOT DETECTED   Bordetella Parapertussis NOT DETECTED NOT DETECTED   Chlamydophila pneumoniae NOT DETECTED NOT DETECTED   Mycoplasma pneumoniae NOT DETECTED NOT DETECTED    Comment: Performed at [facility], [address] [place], [place] [address]    Blood Alcohol level:  Lab Results  Component Value Date   [facility] <10 05/27/2023   ETH <10 05/07/2023    Metabolic Disorder Labs: Lab Results  Component Value Date   HGBA1C 5.8 (H) 02/20/2017   MPG 120 02/20/2017   MPG 114 06/13/2010   No results found for: "PROLACTIN" Lab Results  Component Value Date   CHOL 241 (H) 04/04/2022   TRIG 574 (H) 04/04/2022   HDL 36 (L) 04/04/2022   CHOLHDL 6.7 (H) 04/04/2022   VLDL 58  (H) 08/21/2016   LDLCALC  04/04/2022     Comment:     . LDL cholesterol not calculated. Triglyceride levels greater than 400 mg/dL invalidate calculated LDL results. . Reference range: <100 . Desirable range <100 mg/dL for primary prevention;   <70 mg/dL for patients with CHD or diabetic patients  with > or = 2 CHD risk factors. Marland Kitchen LDL-C is now calculated using the Martin-Hopkins  calculation, which is a validated novel method providing  better accuracy than the Friedewald equation in the  estimation of LDL-C.  Horald Pollen et al. Lenox Ahr. 5621;308(65): 2061-2068  (http://education.[facility].com/faq/FAQ164)    LDLCALC 126 (H) 07/12/2020    Physical Findings: AIMS:  , ,  ,  ,    CIWA:    COWS:     Musculoskeletal: Strength & Muscle Tone: within normal limits Gait & Station: normal Patient leans: N/A  Psychiatric Specialty Exam:  Presentation  General Appearance:  Bizarre; Disheveled  Eye Contact: Fleeting  Speech: Pressured  Speech Volume: Normal  Handedness: Right   Mood and Affect  Mood: Anxious; Irritable  Affect: Inappropriate; Full Range   Thought Process  Thought Processes: Disorganized  Descriptions of Associations:Loose  Orientation:Full (Time, Place and Person)  Thought Content:Illogical  History of Schizophrenia/Schizoaffective disorder:No  Duration of Psychotic Symptoms:N/A  Hallucinations:No data recorded Ideas of Reference:None  Suicidal Thoughts:No data recorded Homicidal Thoughts:No data recorded  Sensorium  Memory: Immediate Poor  Judgment: Impaired  Insight: Lacking; Shallow   Executive Functions  Concentration: Poor  Attention Span: Poor  Recall: Poor  Fund of Knowledge: Poor  Language: Poor   Psychomotor Activity  Psychomotor Activity:No data recorded  Assets  Assets: Communication Skills; Housing   Sleep  Sleep:No data recorded    Blood pressure (!) 141/84, pulse 88, temperature  98.6 F (37 C), resp. rate 17, height 6\' 1"  (1.854 m), weight 78.5 kg, SpO2 96%. Body mass index is 22.82 kg/m.   Treatment Plan Summary: Daily contact with patient to assess and evaluate symptoms and progress in treatment, Medication management, and Plan continue current medications.  Angelette Ganus Tresea Mall, DO 06/01/2023, 11:12 AM

## 2023-06-01 NOTE — Plan of Care (Signed)
  Problem: Nutrition: Goal: Adequate nutrition will be maintained Outcome: Progressing   Problem: Coping: Goal: Level of anxiety will decrease Outcome: Progressing   Problem: Safety: Goal: Ability to remain free from injury will improve Outcome: Progressing Patient stayed in the Milieu this shift with encouragement. Stated he would like to go and see his HIV Doctor in [place] when he gets discharged. Patient is sad and depressed. Denies SI/HI/A/VH and verbally contracts for safety. Ambulating with wheelchair requires minimum assistance in his room. Support and encouragement provided. Q 15 minutes safety checks ongoing Patient remains safe.

## 2023-06-01 NOTE — Group Note (Signed)
Date:  06/01/2023 Time:  2:46 PM  Group Topic/Focus:  Healthy Personal Boundaries  The purpose of this group was to identity the differences between the different groups of personal boundaries such as rigid, porous and healthy boundaries. Also indentifying which boundary was theirs.    Participation Level:  Did Not Attend  Participation Quality:    Affect:    Cognitive:    Insight:   Engagement in Group:    Modes of Intervention:    Additional Comments:  Did not attend   Johnnell Liou T Noe Gens 06/01/2023, 2:46 PM

## 2023-06-02 DIAGNOSIS — F23 Brief psychotic disorder: Secondary | ICD-10-CM | POA: Diagnosis not present

## 2023-06-02 MED ORDER — GABAPENTIN 100 MG PO CAPS
200.0000 mg | ORAL_CAPSULE | Freq: Three times a day (TID) | ORAL | Status: DC
  Administered 2023-06-02 – 2023-06-03 (×4): 200 mg via ORAL
  Filled 2023-06-02 (×5): qty 2

## 2023-06-02 NOTE — Progress Notes (Signed)
[name] MD Progress Note  06/02/2023 1:02 PM Devin [name]  MRN:  [id number] Subjective: Devin [name] is seen on rounds.  He still complains of total body pain.  His daughter called yesterday and the nurses asked if we could do a CT scan but it was done at [facility] at his last admission which was normal.  He seems to be doing a little bit better since starting Neurontin I think it might be helping with his mood, also.  Medical appointment 200 mg he can probably tolerate even higher.  He has been getting out of his room and eating.  He does not talk of his delusions as much unless you ask him.  I think they are fixed and hopefully he will not call the police anymore when he gets home.  He probably needs a virtual follow-up visit with psychiatry because he has a hard time making his appointments and I am not sure he has transportation unless his daughter can do it.  Principal Problem: Acute psychosis (HCC) Fixed Delusions Diagnosis: Principal Problem:   Acute psychosis (HCC)  Total Time spent with patient: 15 minutes  Past Psychiatric History: 1 previous psychiatric admission for the same thing  Past Medical History:  Past Medical History:  Diagnosis Date   Anxiety    Arthritis    "I'm eat up w/it" (02/20/2017)   Asthma    Chronic back pain    "the whole back" (02/20/2017)   Chronic bronchitis (HCC)    Complication of anesthesia    "felt like I couldn't breath coming out of it"   COPD (chronic obstructive pulmonary disease) (HCC)    Depression    GERD (gastroesophageal reflux disease)    History of hiatal hernia    History of stomach ulcers    "bleeding ones; I was young then"   HIV infection (HCC) dx'd ~ 1999   Hypertension    Pneumonia    "several times" (02/20/2017)   Prolapsed disk 10/28   Pulmonary embolism (HCC) 02/20/2017   Scoliosis 08/24/13    Past Surgical History:  Procedure Laterality Date   BACK SURGERY  2019   KNEE ARTHROSCOPY Right 1980s   Family History:  Family History   Problem Relation Age of Onset   Diabetes Father    Stroke Other    Colon cancer Neg Hx    Family Psychiatric  History: Unremarkable Social History:  Social History   Substance and Sexual Activity  Alcohol Use Not Currently   Comment: last used about 9 years ago     Social History   Substance and Sexual Activity  Drug Use Yes   Types: Marijuana   Comment: occ    Social History   Socioeconomic History   Marital status: Divorced    Spouse name: Not on file   Number of children: Not on file   Years of education: Not on file   Highest education level: Not on file  Occupational History   Not on file  Tobacco Use   Smoking status: Every Day    Current packs/day: 1.00    Average packs/day: 1 pack/day for 48.0 years (48.0 ttl pk-yrs)    Types: Cigarettes   Smokeless tobacco: Never  Vaping Use   Vaping status: Never Used  Substance and Sexual Activity   Alcohol use: Not Currently    Comment: last used about 9 years ago   Drug use: Yes    Types: Marijuana    Comment: occ   Sexual activity: Yes  Partners: Female    Comment: declined condoms  Other Topics Concern   Not on file  Social History Narrative   Not on file   Social Determinants of Health   Financial Resource Strain: Not on file  Food Insecurity: Food Insecurity Present (05/28/2023)   Hunger Vital Sign    Worried About Running Out of Food in the Last Year: Patient unable to answer    Ran Out of Food in the Last Year: Sometimes true  Transportation Needs: Unmet Transportation Needs (05/28/2023)   PRAPARE - Administrator, Civil Service (Medical): Yes    Lack of Transportation (Non-Medical): Yes  Physical Activity: Not on file  Stress: Not on file  Social Connections: Not on file   Additional Social History:                         Sleep: Fair  Appetite:  Good  Current Medications: Current Facility-Administered Medications  Medication Dose Route Frequency Provider Last Rate  Last Admin   acetaminophen (TYLENOL) tablet 650 mg  650 mg Oral Q6H PRN Jearld Lesch, NP   650 mg at 05/31/23 2119   alum & mag hydroxide-simeth (MAALOX/MYLANTA) 200-200-20 MG/5ML suspension 30 mL  30 mL Oral Q4H PRN Jearld Lesch, NP       amLODipine (NORVASC) tablet 5 mg  5 mg Oral Daily Sarina Ill, DO   5 mg at 06/02/23 1107   apixaban (ELIQUIS) tablet 2.5 mg  2.5 mg Oral BID Sarina Ill, DO   2.5 mg at 06/02/23 1107   atorvastatin (LIPITOR) tablet 10 mg  10 mg Oral Daily Sarina Ill, DO   10 mg at 06/02/23 1107   docusate sodium (COLACE) capsule 100 mg  100 mg Oral BID Sarina Ill, DO   100 mg at 06/02/23 1107   elvitegravir-cobicistat-emtricitabine-tenofovir (GENVOYA) 150-150-200-10 MG tablet 1 tablet  1 tablet Oral Q breakfast Sarina Ill, DO   1 tablet at 06/02/23 1106   FLUoxetine (PROZAC) capsule 20 mg  20 mg Oral Daily Sarina Ill, DO   20 mg at 06/02/23 1107   furosemide (LASIX) tablet 40 mg  40 mg Oral Daily Sarina Ill, DO   40 mg at 06/02/23 1107   gabapentin (NEURONTIN) capsule 200 mg  200 mg Oral TID Sarina Ill, DO       Ipratropium-Albuterol (COMBIVENT) respimat 1 puff  1 puff Inhalation Q6H PRN Sarina Ill, DO   1 puff at 06/01/23 0911   LORazepam (ATIVAN) tablet 1 mg  1 mg Oral Q4H PRN Sarina Ill, DO       magnesium hydroxide (MILK OF MAGNESIA) suspension 30 mL  30 mL Oral Daily PRN Jearld Lesch, NP       pantoprazole (PROTONIX) EC tablet 40 mg  40 mg Oral Daily Timathy, Mixell, DO   40 mg at 06/02/23 1107   potassium chloride SA (KLOR-CON M) CR tablet 20 mEq  20 mEq Oral QPC breakfast Jadarius, Kulish, DO   20 mEq at 06/02/23 1107   risperiDONE (RISPERDAL) tablet 0.5 mg  0.5 mg Oral [id number] Sarina Ill, DO   0.5 mg at 06/02/23 1105   risperiDONE (RISPERDAL) tablet 1 mg  1 mg Oral QHS Sarina Ill, DO   1 mg at  06/01/23 2114   traZODone (DESYREL) tablet 100 mg  100 mg Oral QHS Sarina Ill, DO  100 mg at 06/01/23 2114    Lab Results:  Results for orders placed or performed during the hospital encounter of 05/28/23 (from the past 48 hour(s))  Hemoglobin A1c     Status: Abnormal   Collection Time: 06/01/23 11:13 AM  Result Value Ref Range   Hgb A1c MFr Bld 5.8 (H) 4.8 - 5.6 %    Comment: (NOTE) Pre diabetes:          5.7%-6.4%  Diabetes:              >6.4%  Glycemic control for   <7.0% adults with diabetes    Mean Plasma Glucose 119.76 mg/dL    Comment: Performed at [facility], [address], [place], [place] [address]  Lipid panel     Status: Abnormal   Collection Time: 06/01/23 11:13 AM  Result Value Ref Range   Cholesterol 124 0 - 200 mg/dL   Triglycerides 81 <469 mg/dL   HDL 40 (L) >62 mg/dL   Total CHOL/HDL Ratio 3.1 RATIO   VLDL 16 0 - 40 mg/dL   LDL Cholesterol 68 0 - 99 mg/dL    Comment:        Total Cholesterol/HDL:CHD Risk Coronary Heart Disease Risk Table                     Men   Women  1/2 Average Risk   3.4   3.3  Average Risk       5.0   4.4  2 X Average Risk   9.6   7.1  3 X Average Risk  23.4   11.0        Use the calculated Patient Ratio above and the CHD Risk Table to determine the patient's CHD Risk.        ATP III CLASSIFICATION (LDL):  <100     mg/dL   Optimal  952-841  mg/dL   Near or Above                    Optimal  130-159  mg/dL   Borderline  324-401  mg/dL   High  >027     mg/dL   Very High Performed at [facility], [address] [place], [place] [address]   CBC     Status: Abnormal   Collection Time: 06/01/23 11:13 AM  Result Value Ref Range   WBC 4.9 4.0 - 10.5 K/uL   RBC 4.29 4.22 - 5.81 MIL/uL   Hemoglobin 13.7 13.0 - 17.0 g/dL   HCT 44.0 34.7 - 42.5 %   MCV 93.7 80.0 - 100.0 fL   MCH 31.9 26.0 - 34.0 pg   MCHC 34.1 30.0 - 36.0 g/dL   RDW 95.6 (L) 38.7 - 56.4 %   Platelets 223 150 - 400 K/uL   nRBC  0.0 0.0 - 0.2 %    Comment: Performed at [facility], [address] [place], [place] [address]    Blood Alcohol level:  Lab Results  Component Value Date   [facility] <10 05/27/2023   ETH <10 05/07/2023    Metabolic Disorder Labs: Lab Results  Component Value Date   HGBA1C 5.8 (H) 06/01/2023   MPG 119.76 06/01/2023   MPG 120 02/20/2017   No results found for: "PROLACTIN" Lab Results  Component Value Date   CHOL 124 06/01/2023   TRIG 81 06/01/2023   HDL 40 (L) 06/01/2023   CHOLHDL 3.1 06/01/2023   VLDL  16 06/01/2023   LDLCALC 68 06/01/2023   LDLCALC  04/04/2022     Comment:     . LDL cholesterol not calculated. Triglyceride levels greater than 400 mg/dL invalidate calculated LDL results. . Reference range: <100 . Desirable range <100 mg/dL for primary prevention;   <70 mg/dL for patients with CHD or diabetic patients  with > or = 2 CHD risk factors. Marland Kitchen LDL-C is now calculated using the Martin-Hopkins  calculation, which is a validated novel method providing  better accuracy than the Friedewald equation in the  estimation of LDL-C.  Horald Pollen et al. Lenox Ahr. 1093;235(57): 2061-2068  (http://education.[facility].com/faq/FAQ164)     Physical Findings: AIMS:  , ,  ,  ,    CIWA:    COWS:     Musculoskeletal: Strength & Muscle Tone: within normal limits Gait & Station: normal Patient leans: N/A  Psychiatric Specialty Exam:  Presentation  General Appearance:  Bizarre; Disheveled  Eye Contact: Fleeting  Speech: Pressured  Speech Volume: Normal  Handedness: Right   Mood and Affect  Mood: Anxious; Irritable  Affect: Inappropriate; Full Range   Thought Process  Thought Processes: Disorganized  Descriptions of Associations:Loose  Orientation:Full (Time, Place and Person)  Thought Content:Illogical  History of Schizophrenia/Schizoaffective disorder:No  Duration of Psychotic Symptoms:N/A  Hallucinations:No data  recorded Ideas of Reference:None  Suicidal Thoughts:No data recorded Homicidal Thoughts:No data recorded  Sensorium  Memory: Immediate Poor  Judgment: Impaired  Insight: Lacking; Shallow   Executive Functions  Concentration: Poor  Attention Span: Poor  Recall: Poor  Fund of Knowledge: Poor  Language: Poor   Psychomotor Activity  Psychomotor Activity:No data recorded  Assets  Assets: Communication Skills; Housing   Sleep  Sleep:No data recorded    Blood pressure 111/64, pulse 93, temperature 98.4 F (36.9 C), resp. rate 16, height 6\' 1"  (1.854 m), weight 78.5 kg, SpO2 96%. Body mass index is 22.82 kg/m.   Treatment Plan Summary: Daily contact with patient to assess and evaluate symptoms and progress in treatment, Medication management, and Plan increase Neurontin to 200 mg 3 times a day.  Cacey Willow Tresea Mall, DO 06/02/2023, 1:02 PM

## 2023-06-02 NOTE — Progress Notes (Signed)
   06/02/23 1300  Psych Admission Type (Psych Patients Only)  Admission Status Involuntary  Psychosocial Assessment  Patient Complaints Isolation;Sadness;Depression  Eye Contact Brief  Facial Expression Flat  Affect Flat  Speech Soft  Interaction Needy;Minimal;Isolative  Motor Activity Slow  Appearance/Hygiene Poor hygiene;In hospital gown  Behavior Characteristics Unwilling to participate  Mood Depressed;Anhedonia  Thought Process  Coherency Circumstantial  Content Blaming others  Delusions Paranoid  Perception WDL  Hallucination None reported or observed  Judgment Impaired  Confusion Mild  Danger to Self  Current suicidal ideation? Denies  Danger to Others  Danger to Others None reported or observed   Isolative with minimal interaction with peers and staff. Tolerated all medications and meals. Did not attend groups.

## 2023-06-02 NOTE — BH IP Treatment Plan (Signed)
Interdisciplinary Treatment and Diagnostic Plan Update  06/02/2023 Time of Session: 9:00 AM  Devin [name] MRN: [id number]  Principal Diagnosis: Acute psychosis (HCC)  Secondary Diagnoses: Principal Problem:   Acute psychosis (HCC)   Current Medications:  Current Facility-Administered Medications  Medication Dose Route Frequency Provider Last Rate Last Admin   acetaminophen (TYLENOL) tablet 650 mg  650 mg Oral Q6H PRN Jearld Lesch, NP   650 mg at 05/31/23 2119   alum & mag hydroxide-simeth (MAALOX/MYLANTA) 200-200-20 MG/5ML suspension 30 mL  30 mL Oral Q4H PRN Jearld Lesch, NP       amLODipine (NORVASC) tablet 5 mg  5 mg Oral Daily Sarina Ill, DO   5 mg at 06/02/23 1107   apixaban (ELIQUIS) tablet 2.5 mg  2.5 mg Oral BID Sarina Ill, DO   2.5 mg at 06/02/23 1107   atorvastatin (LIPITOR) tablet 10 mg  10 mg Oral Daily Sarina Ill, DO   10 mg at 06/02/23 1107   docusate sodium (COLACE) capsule 100 mg  100 mg Oral BID Sarina Ill, DO   100 mg at 06/02/23 1107   elvitegravir-cobicistat-emtricitabine-tenofovir (GENVOYA) 150-150-200-10 MG tablet 1 tablet  1 tablet Oral Q breakfast Sarina Ill, DO   1 tablet at 06/02/23 1106   FLUoxetine (PROZAC) capsule 20 mg  20 mg Oral Daily Sarina Ill, DO   20 mg at 06/02/23 1107   furosemide (LASIX) tablet 40 mg  40 mg Oral Daily Sarina Ill, DO   40 mg at 06/02/23 1107   gabapentin (NEURONTIN) capsule 200 mg  200 mg Oral TID Sarina Ill, DO       Ipratropium-Albuterol (COMBIVENT) respimat 1 puff  1 puff Inhalation Q6H PRN Sarina Ill, DO   1 puff at 06/01/23 0911   LORazepam (ATIVAN) tablet 1 mg  1 mg Oral Q4H PRN Sarina Ill, DO       magnesium hydroxide (MILK OF MAGNESIA) suspension 30 mL  30 mL Oral Daily PRN Durwin Nora, Rashaun M, NP       pantoprazole (PROTONIX) EC tablet 40 mg  40 mg Oral Daily Denico, Shadburn, DO   40 mg  at 06/02/23 1107   potassium chloride SA (KLOR-CON M) CR tablet 20 mEq  20 mEq Oral QPC breakfast Ching, Zayas, DO   20 mEq at 06/02/23 1107   risperiDONE (RISPERDAL) tablet 0.5 mg  0.5 mg Oral [id number] Sarina Ill, DO   0.5 mg at 06/02/23 1105   risperiDONE (RISPERDAL) tablet 1 mg  1 mg Oral QHS Sarina Ill, DO   1 mg at 06/01/23 2114   traZODone (DESYREL) tablet 100 mg  100 mg Oral QHS Sarina Ill, DO   100 mg at 06/01/23 2114   PTA Medications: Medications Prior to Admission  Medication Sig Dispense Refill Last Dose   albuterol (PROVENTIL HFA;VENTOLIN HFA) 108 (90 BASE) MCG/ACT inhaler Inhale 2 puffs into the lungs every 4 (four) hours as needed for wheezing or shortness of breath.      albuterol (PROVENTIL) (2.5 MG/3ML) 0.083% nebulizer solution Take 3 mLs (2.5 mg total) by nebulization every 6 (six) hours as needed for wheezing or shortness of breath. (Patient not taking: Reported on 05/07/2023) 75 mL 2    amitriptyline (ELAVIL) 50 MG tablet Take 50 mg by mouth at bedtime as needed for sleep.  (Patient not taking: Reported on 05/07/2023)      amLODipine (NORVASC) 5 MG tablet  Take 5 mg by mouth every morning.      atorvastatin (LIPITOR) 10 MG tablet Take 10 mg by mouth daily.      Budeson-Glycopyrrol-Formoterol (BREZTRI AEROSPHERE) 160-9-4.8 MCG/ACT AERO Inhale 2 puffs into the lungs in the morning and at bedtime. (Patient not taking: Reported on 05/07/2023) 10.7 g 3    calcium carbonate (TUMS EX) 750 MG chewable tablet Chew 2 tablets by mouth as needed for heartburn. (Patient not taking: Reported on 05/27/2023)      cholecalciferol (VITAMIN D3) 25 MCG (1000 UNIT) tablet Take 1,000 Units by mouth daily. (Patient not taking: Reported on 05/07/2023)      COMBIVENT RESPIMAT 20-100 MCG/ACT AERS respimat Inhale 2 puffs into the lungs every 4 (four) hours as needed. wheezing      cyclobenzaprine (FLEXERIL) 10 MG tablet Take 10 mg by mouth 3 (three) times  daily as needed for muscle spasms.      diazepam (VALIUM) 10 MG tablet Take 10 mg by mouth 3 (three) times daily as needed for anxiety.       divalproex (DEPAKOTE) 250 MG DR tablet Take 250 mg by mouth 3 (three) times daily. (Patient not taking: Reported on 05/27/2023)      docusate sodium (COLACE) 100 MG capsule Take 100 mg by mouth daily. (Patient not taking: Reported on 05/27/2023)      ELIQUIS 5 MG TABS tablet Take 5 mg by mouth 2 (two) times daily.      elvitegravir-cobicistat-emtricitabine-tenofovir (GENVOYA) 150-150-200-10 MG TABS tablet Take 1 tablet by mouth daily with breakfast. (Patient not taking: Reported on 05/27/2023) 30 tablet 5    elvitegravir-cobicistat-emtricitabine-tenofovir (GENVOYA) 150-150-200-10 MG TABS tablet Take 1 tablet by mouth daily with breakfast. (Patient not taking: Reported on 05/27/2023) 7 tablet 0    furosemide (LASIX) 40 MG tablet Take 40 mg by mouth daily as needed.      Multiple Vitamins-Minerals (MULTI FOR HIM 50+) TABS Take 1 tablet by mouth daily. (Patient not taking: Reported on 05/27/2023)      oxyCODONE (OXYCONTIN) 15 mg 12 hr tablet Take 1 tablet (15 mg total) by mouth every 12 (twelve) hours. (Patient not taking: Reported on 05/07/2023) 14 tablet 0    Oxycodone HCl 10 MG TABS Take 10-20 mg by mouth every 6 (six) hours as needed for pain. (Patient not taking: Reported on 05/07/2023)      pantoprazole (PROTONIX) 40 MG tablet Take 40 mg by mouth daily.      pregabalin (LYRICA) 300 MG capsule Take 300 mg by mouth every 12 (twelve) hours. (Patient not taking: Reported on 05/07/2023)      QVAR REDIHALER 80 MCG/ACT inhaler Inhale 1 puff into the lungs 2 (two) times daily.      rosuvastatin (CRESTOR) 5 MG tablet Take 5 mg by mouth daily. (Patient not taking: Reported on 05/27/2023)       Patient Stressors: Health problems   Medication change or noncompliance   Substance abuse    Patient Strengths: Capable of independent living   Treatment Modalities: Medication  Management, Group therapy, Case management,  1 to 1 session with clinician, Psychoeducation, Recreational therapy.   Physician Treatment Plan for Primary Diagnosis: Acute psychosis (HCC) Long Term Goal(s): Improvement in symptoms so as ready for discharge   Short Term Goals: Ability to identify changes in lifestyle to reduce recurrence of condition will improve Ability to verbalize feelings will improve Ability to disclose and discuss suicidal ideas Ability to demonstrate self-control will improve Ability to identify and develop effective coping  behaviors will improve Ability to maintain clinical measurements within normal limits will improve Compliance with prescribed medications will improve Ability to identify triggers associated with substance abuse/mental health issues will improve  Medication Management: Evaluate patient's response, side effects, and tolerance of medication regimen.  Therapeutic Interventions: 1 to 1 sessions, Unit Group sessions and Medication administration.  Evaluation of Outcomes: Not Progressing  Physician Treatment Plan for Secondary Diagnosis: Principal Problem:   Acute psychosis (HCC)  Long Term Goal(s): Improvement in symptoms so as ready for discharge   Short Term Goals: Ability to identify changes in lifestyle to reduce recurrence of condition will improve Ability to verbalize feelings will improve Ability to disclose and discuss suicidal ideas Ability to demonstrate self-control will improve Ability to identify and develop effective coping behaviors will improve Ability to maintain clinical measurements within normal limits will improve Compliance with prescribed medications will improve Ability to identify triggers associated with substance abuse/mental health issues will improve     Medication Management: Evaluate patient's response, side effects, and tolerance of medication regimen.  Therapeutic Interventions: 1 to 1 sessions, Unit Group  sessions and Medication administration.  Evaluation of Outcomes: Not Progressing   RN Treatment Plan for Primary Diagnosis: Acute psychosis (HCC) Long Term Goal(s): Knowledge of disease and therapeutic regimen to maintain health will improve  Short Term Goals: Ability to remain free from injury will improve, Ability to verbalize frustration and anger appropriately will improve, Ability to demonstrate self-control, Ability to participate in decision making will improve, Ability to verbalize feelings will improve, Ability to disclose and discuss suicidal ideas, Ability to identify and develop effective coping behaviors will improve, and Compliance with prescribed medications will improve  Medication Management: RN will administer medications as ordered by provider, will assess and evaluate patient's response and provide education to patient for prescribed medication. RN will report any adverse and/or side effects to prescribing provider.  Therapeutic Interventions: 1 on 1 counseling sessions, Psychoeducation, Medication administration, Evaluate responses to treatment, Monitor vital signs and CBGs as ordered, Perform/monitor CIWA, COWS, AIMS and Fall Risk screenings as ordered, Perform wound care treatments as ordered.  Evaluation of Outcomes: Not Progressing   LCSW Treatment Plan for Primary Diagnosis: Acute psychosis (HCC) Long Term Goal(s): Safe transition to appropriate next level of care at discharge, Engage patient in therapeutic group addressing interpersonal concerns.  Short Term Goals: Engage patient in aftercare planning with referrals and resources, Increase social support, Increase ability to appropriately verbalize feelings, Increase emotional regulation, Facilitate acceptance of mental health diagnosis and concerns, and Increase skills for wellness and recovery  Therapeutic Interventions: Assess for all discharge needs, 1 to 1 time with Social worker, Explore available resources and  support systems, Assess for adequacy in community support network, Educate family and significant other(s) on suicide prevention, Complete Psychosocial Assessment, Interpersonal group therapy.  Evaluation of Outcomes: Not Progressing    Progress in Treatment: Attending groups: No. Participating in groups: No. Taking medication as prescribed: Yes. Toleration medication: Yes. Family/Significant other contact made: No, will contact:  CSW will contact if given permission  Patient understands diagnosis: No. Discussing patient identified problems/goals with staff: Yes. Medical problems stabilized or resolved: Yes. Denies suicidal/homicidal ideation: Yes. Issues/concerns per patient self-inventory: No. Other: None    New problem(s) identified: No, Describe:  None identified  Update 06/02/23: No changes at this time    New Short Term/Long Term Goal(s): elimination of symptoms of psychosis, medication management for mood stabilization; elimination of SI thoughts; development of comprehensive mental wellness plan.  Update 06/02/23: No changes at this time    Patient Goals:  "Trying to get out, the office said I had a gun involved and I didn't, I just want to know why no one believes me" Update 06/02/23: No changes at this time    Discharge Plan or Barriers: CSW will assist with appropriate discharge planning Update 06/02/23: No changes at this time    Reason for Continuation of Hospitalization: Delusions  Hallucinations Medication stabilization    Estimated Length of Stay: 1 to 7 days Update 06/02/23: No changes at this time   Last 3 [place] Suicide Severity Risk Score: Flowsheet Row Admission (Current) from 05/28/2023 in [facility] from 05[facility]27/2023 in [facility] Emergency Department at [facility] ED from 05/07/2023 in [facility] Emergency Department at [facility]  C-SSRS RISK CATEGORY No Risk No Risk No Risk       Last PHQ 2/9 Scores:    04/04/2022     3:06 PM 05/17/2021   10:33 AM 05/08/2021    3:15 PM  Depression screen PHQ 2/9  Decreased Interest 0 3 0  Down, Depressed, Hopeless 1 3 1   PHQ - 2 Score 1 6 1   Altered sleeping  3   Tired, decreased energy  3   Change in appetite  3   Feeling bad or failure about yourself   3   Trouble concentrating  3   Moving slowly or fidgety/restless  3   Suicidal thoughts  1   PHQ-9 Score  25   Difficult doing work/chores  Very difficult     Scribe for Treatment Team: Elza Rafter, Theresia Majors 06/02/2023 4:14 PM

## 2023-06-02 NOTE — Progress Notes (Signed)
I assumed care for Devin [name] at about 19:30.  He was resting in his bed, alert, at baseline, denied any avh/hi/si, continues to report a belief that people were monitoring him at his home and may have had access to his bank accounts. He reports chronic pain all over at baseline, he has been med compliant, no falls, uses a wheelchair for ambulation, attended group and participated appropriately. No prn medication needed or requested for thus far this shift. He is being monitored as ordered.

## 2023-06-02 NOTE — Progress Notes (Signed)
Patient was pleasant and cooperative this shift. Denies SI, HI, AVH. Reports a lie was told on him to get him in here. Upset, states the provider will not contact his daughter to find out the plan. Veteran did attend group tonight. No complaints or concerns voiced.  Encouragement and support provided. Safety checks maintained. Medications given as prescribed. Pt receptive and remains safe on unit with q 15 min checks.

## 2023-06-02 NOTE — Group Note (Signed)
Recreation Therapy Group Note   Group Topic:Other  Group Date: 06/02/2023 Start Time: 1400 End Time: 1435 Facilitators: Rosina Lowenstein, LRT, CTRS Location: Courtyard   Group Description: Feelings Towards Discharge. Patients, LRT and NT all sat outside while listening to music and getting fresh air and sunlight. The group discussed how they feel about going home, their stay at the hospital, things they have learned, and the first thing they will do when they get home.  Goal Area(s) Addressed: Patient will acknowledge the benefits of sunlight and fresh air to mental health.  Patient will reflect on previous life events surrounding their hospital stay. Patients will communicate with peers and LRT.    Affect/Mood: N/A   Participation Level: Did not attend    Clinical Observations/Individualized Feedback: Devin [name] did not attend group.  Plan: Continue to engage patient in RT group sessions 2-3x/week.   Rosina Lowenstein, LRT, CTRS 06/02/2023 3:14 PM

## 2023-06-02 NOTE — Group Note (Unsigned)
Date:  06/02/2023 Time:  3:20 PM  Group Topic/Focus:  Coping With Mental Health Crisis:   The purpose of this group is to help patients identify strategies for coping with mental health crisis.  Group discusses possible causes of crisis and ways to manage them effectively. Healthy Communication:   The focus of this group is to discuss communication, barriers to communication, as well as healthy ways to communicate with others.  We were able to go outside to get some fresh air and we listened to music as well!     Participation Level:  {BHH PARTICIPATION DGLOV:56433}  Participation Quality:  {BHH PARTICIPATION QUALITY:22265}  Affect:  {BHH AFFECT:22266}  Cognitive:  {BHH COGNITIVE:22267}  Insight: {BHH Insight2:20797}  Engagement in Group:  {BHH ENGAGEMENT IN GROUP:22268}  Modes of Intervention:  {BHH MODES OF INTERVENTION:22269}  Additional Comments:  ***  Canaan Holzer L Jadin Creque 06/02/2023, 3:20 PM

## 2023-06-02 NOTE — Group Note (Signed)
  Date:  06/02/2023 Time:  11:24 PM  Group Topic/Focus:  Building Self Esteem:   The Focus of this group is helping patients become aware of the effects of self-esteem on their lives, the things they and others do that enhance or undermine their self-esteem, seeing the relationship between their level of self-esteem and the choices they make and learning ways to enhance self-esteem. Coping With Mental Health Crisis:   The purpose of this group is to help patients identify strategies for coping with mental health crisis.  Group discusses possible causes of crisis and ways to manage them effectively. Conflict Resolution:   The focus of this group is to discuss the conflict resolution process and how it may be used upon discharge. Crisis Planning:   The purpose of this group is to help patients create a crisis plan for use upon discharge or in the future, as needed. Developing a Wellness Toolbox:   The focus of this group is to help patients develop a "wellness toolbox" with skills and strategies to promote recovery upon discharge. Early Warning Signs:   The focus of this group is to help patients identify signs or symptoms they exhibit before slipping into an unhealthy state or crisis. Emotional Education:   The focus of this group is to discuss what feelings/emotions are, and how they are experienced. Goals Group:   The focus of this group is to help patients establish daily goals to achieve during treatment and discuss how the patient can incorporate goal setting into their daily lives to aide in recovery. Healthy Communication:   The focus of this group is to discuss communication, barriers to communication, as well as healthy ways to communicate with others. Identifying Needs:   The focus of this group is to help patients identify their personal needs that have been historically problematic and identify healthy behaviors to address their needs. Making Healthy Choices:   The focus of this group is to  help patients identify negative/unhealthy choices they were using prior to admission and identify positive/healthier coping strategies to replace them upon discharge. Overcoming Stress:   The focus of this group is to define stress and help patients assess their triggers. Personal Choices and Values:   The focus of this group is to help patients assess and explore the importance of values in their lives, how their values affect their decisions, how they express their values and what opposes their expression. Self Care:   The focus of this group is to help patients understand the importance of self-care in order to improve or restore emotional, physical, spiritual, interpersonal, and financial health.    Participation Level:  Active  Participation Quality:  Appropriate  Affect:  Appropriate  Cognitive:  Appropriate  Insight: Appropriate  Engagement in Group:  Engaged  Modes of Intervention:  Discussion  Additional Comments:    Devin [name] 06/02/2023, 11:24 PM

## 2023-06-02 NOTE — Plan of Care (Signed)

## 2023-06-02 NOTE — Plan of Care (Signed)
  Problem: Education: Goal: Knowledge of General Education information will improve Description: Including pain rating scale, medication(s)/side effects and non-pharmacologic comfort measures Outcome: Progressing   Problem: Coping: Goal: Level of anxiety will decrease Outcome: Progressing   Problem: Safety: Goal: Ability to remain free from injury will improve Outcome: Progressing   

## 2023-06-03 MED ORDER — DOCUSATE SODIUM 100 MG PO CAPS: 100.0000 mg | ORAL_CAPSULE | Freq: Two times a day (BID) | ORAL | 0 refills | Status: DC

## 2023-06-03 MED ORDER — AMLODIPINE BESYLATE 5 MG PO TABS: 5.0000 mg | ORAL_TABLET | Freq: Every day | ORAL | 0 refills | Status: DC

## 2023-06-03 MED ORDER — RISPERIDONE 0.5 MG PO TABS: 0.5000 mg | ORAL_TABLET | ORAL | 0 refills | Status: DC

## 2023-06-03 MED ORDER — POTASSIUM CHLORIDE CRYS ER 20 MEQ PO TBCR: 20.0000 meq | EXTENDED_RELEASE_TABLET | Freq: Every day | ORAL | 1 refills | Status: DC

## 2023-06-03 MED ORDER — FUROSEMIDE 40 MG PO TABS: 40.0000 mg | ORAL_TABLET | Freq: Every day | ORAL | 0 refills | Status: DC

## 2023-06-03 MED ORDER — GABAPENTIN 100 MG PO CAPS: 200.0000 mg | ORAL_CAPSULE | Freq: Three times a day (TID) | ORAL | 0 refills | Status: DC

## 2023-06-03 MED ORDER — FLUOXETINE HCL 20 MG PO CAPS: 20.0000 mg | ORAL_CAPSULE | Freq: Every day | ORAL | 0 refills | Status: DC

## 2023-06-03 MED ORDER — TRAZODONE HCL 100 MG PO TABS: 100.0000 mg | ORAL_TABLET | Freq: Every day | ORAL | 0 refills | Status: DC

## 2023-06-03 MED ORDER — RISPERIDONE 1 MG PO TABS: 1.0000 mg | ORAL_TABLET | Freq: Every day | ORAL | 0 refills | Status: DC

## 2023-06-03 MED ORDER — ELVITEG-COBIC-EMTRICIT-TENOFAF 150-150-200-10 MG PO TABS: 1.0000 | ORAL_TABLET | Freq: Every day | ORAL | 0 refills | Status: DC

## 2023-06-03 MED ORDER — ATORVASTATIN CALCIUM 10 MG PO TABS: 10.0000 mg | ORAL_TABLET | Freq: Every day | ORAL | 0 refills | Status: DC

## 2023-06-03 MED ORDER — PANTOPRAZOLE SODIUM 40 MG PO TBEC: 40.0000 mg | DELAYED_RELEASE_TABLET | Freq: Every day | ORAL | 0 refills | Status: DC

## 2023-06-03 MED ORDER — APIXABAN 2.5 MG PO TABS: 2.5000 mg | ORAL_TABLET | Freq: Two times a day (BID) | ORAL | 0 refills | Status: DC

## 2023-06-03 NOTE — Plan of Care (Signed)
D- Patient alert and oriented. Affect is irritable mood labile Denies SI, HI, AVH, and pain. Pt stated he is anxious and asked for prn ativan Pt stated he is very sad, just learning about the death of his sister.  A- Scheduled medications administered to patient, per MD orders. Support and encouragement provided.  Routine safety checks conducted every 15 minutes.  Patient informed to notify staff with problems or concerns. R- No adverse drug reactions noted. Patient contracts for safety at this time. Patient compliant with medications and treatment plan. Patient receptive, calm, and cooperative. Patient interacts well with others on the unit.  Patient remains safe at this time.

## 2023-06-03 NOTE — BHH Suicide Risk Assessment (Signed)
BHH INPATIENT:  Family/Significant Other Suicide Prevention Education  Suicide Prevention Education:  Education Completed; Land,Tiffany [phone], pt's daughter has been identified by the patient as the family member/significant other with whom the patient will be residing, and identified as the person(s) who will aid the patient in the event of a mental health crisis (suicidal ideations/suicide attempt).  With written consent from the patient, the family member/significant other has been provided the following suicide prevention education, prior to the and/or following the discharge of the patient.  The suicide prevention education provided includes the following: Suicide risk factors Suicide prevention and interventions National Suicide Hotline telephone number [facility] assessment telephone number [facility] Emergency Assistance 911 [facility] and/or Residential Mobile Crisis Unit telephone number  Request made of family/significant other to: Remove weapons (e.g., guns, rifles, knives), all items previously/currently identified as safety concern.   Remove drugs/medications (over-the-counter, prescriptions, illicit drugs), all items previously/currently identified as a safety concern.  The family member/significant other verbalizes understanding of the suicide prevention education information provided.  The family member/significant other agrees to remove the items of safety concern listed above.  Elza Rafter 06/03/2023, 1:46 PM

## 2023-06-03 NOTE — BHH Counselor (Signed)
CSW spoke with pt to talk about guardianship vs. Power of attorney.   Pt said he is unable to think about it due to the passing of his sister, and that he needs to go home and be at home.   CSW stated understanding that he needs to be with family at this time during their loss.   CSW stated she would talk to providers and nurse managers about trying to discharge him due to his loss.   CSW contacted pt's daughter, who states she would be able to get patient later on in the day. CSW asked if there was anything needed from CSW and pt's daughter stated no.   CSW completed SPE with pt's daughter.   CSW will continue discharge planning with pt at this time   Devin [name], MSW, [facility] 06/03/2023 1:45 PM

## 2023-06-03 NOTE — Group Note (Signed)
Date:  06/03/2023 Time:  3:02 PM  Group Topic/Focus:  Coping With Mental Health Crisis:   The purpose of this group is to help patients identify strategies for coping with mental health crisis.  Group discusses possible causes of crisis and ways to manage them effectively. Goals Group:   The focus of this group is to help patients establish daily goals to achieve during treatment and discuss how the patient can incorporate goal setting into their daily lives to aide in recovery.    Participation Level:  Active  Participation Quality:  Appropriate  Affect:  Appropriate  Cognitive:  Appropriate  Insight: Appropriate  Engagement in Group:  Engaged  Modes of Intervention:  Discussion  Additional Comments:    [name] 06/03/2023, 3:02 PM

## 2023-06-03 NOTE — BHH Group Notes (Signed)
RT group was not held due to MHT group being in progress in the dayroom. LRT checked in with patients individually afterwards.   Devin [name] LRT, [address]

## 2023-06-03 NOTE — Progress Notes (Signed)
Pt had asked for prn ativan at 1230 today after learning his sister had died. Behavior remained in control. Pt stated he had little to no effect from ativan and stated he was used to larger doses.

## 2023-06-03 NOTE — BHH Counselor (Signed)
Due to medical records not scanning in IM yet, pt was given blank IM note. It was explained to pt why it was blank and he was reminded of his rights.    Reynaldo Minium, MSW, [place] 06/03/2023 2:32 PM

## 2023-06-03 NOTE — [facility] Counselor (Signed)
CSW returned missed call from pt's daughter Genia Del [phone]).  Pt's daughter explained that pt's sister passed away this morning, and nurses would not allow him to answer the phone when pt's daughter called to let the pt know of her passing.   Pt's daughter states that nurse techs are treating pt "like a [age] year old" and states that when pt asked for a coke, a nurse tech responded, "and what do we say when we ask for things"   CSW stated that she was sorry for the family's loss and sorry that this was the pt's experience while here at the hospital.   CSW asked pt's daughter if she was interested in power of attorney or guardianship. Pt's daughter states that she doesn't know which one is best, and CSW, should ask pt directly and explain to pt each one so he can decide which one he wants to do. CSW explained the differences to daughter, and will explain to pt as well.   CSW stated she would talk to pt about it per daughters request.   Pt's daughter requested that Will Bake be removed as emergency contact from pt's chart. CSW will confirm with pt that he would like Will Koker to be removed.   CSW stated she would call pt's daughter back once pt confirmed whether he would like guardianship or power of attorney.   Pt's daughter asked that pt be visited by the chaplain due to the passing of his sister. CSW stated that she would get that arranged.    Reynaldo Minium, MSW, [place] 06/03/2023 12:57 PM

## 2023-06-03 NOTE — Progress Notes (Signed)
[facility] MD Progress Note  06/03/2023  Devin [name]  MRN:  [id number]   Subjective:  Case discussed with RN i and social worker, patient seen during rounds.  Patient was placed to talk to.  He is well-oriented to time place,and person.  His thoughts were organized and goal-directed.  Patient reports that he thinks somebody was taking stuff from his place.  He said that he had a heater missing which made him think that somebody has been stealing his stuff.  Patient was provided with support and reassurance.  Today patient denies thoughts of harming himself or others.  He denies any psychotic symptoms.  Patient reports that he feels he is ready to go home.  Patient was encouraged to work on a safe discharge plan and coping strategies.  Social worker consulted to help with a safe discharge plan and follow-up appointments.  Principal Problem: Acute psychosis (HCC) Fixed Delusions Diagnosis: Principal Problem:   Acute psychosis (HCC)  Total Time spent with patient: 15 minutes  Past Psychiatric History: 1 previous psychiatric admission for the same thing  Past Medical History:  Past Medical History:  Diagnosis Date   Anxiety    Arthritis    "I'm eat up w/it" (02/20/2017)   Asthma    Chronic back pain    "the whole back" (02/20/2017)   Chronic bronchitis (HCC)    Complication of anesthesia    "felt like I couldn't breath coming out of it"   COPD (chronic obstructive pulmonary disease) (HCC)    Depression    GERD (gastroesophageal reflux disease)    History of hiatal hernia    History of stomach ulcers    "bleeding ones; I was young then"   HIV infection (HCC) dx'd ~ 1999   Hypertension    Pneumonia    "several times" (02/20/2017)   Prolapsed disk 10/28   Pulmonary embolism (HCC) 02/20/2017   Scoliosis 08/24/13    Past Surgical History:  Procedure Laterality Date   BACK SURGERY  2019   KNEE ARTHROSCOPY Right 1980s   Family History:  Family History  Problem Relation Age of Onset    Diabetes Father    Stroke Other    Colon cancer Neg Hx    Family Psychiatric  History: Unremarkable Social History:  Social History   Substance and Sexual Activity  Alcohol Use Not Currently   Comment: last used about 9 years ago     Social History   Substance and Sexual Activity  Drug Use Yes   Types: Marijuana   Comment: occ    Social History   Socioeconomic History   Marital status: Divorced    Spouse name: Not on file   Number of children: Not on file   Years of education: Not on file   Highest education level: Not on file  Occupational History   Not on file  Tobacco Use   Smoking status: Every Day    Current packs/day: 1.00    Average packs/day: 1 pack/day for 48.0 years (48.0 ttl pk-yrs)    Types: Cigarettes   Smokeless tobacco: Never  Vaping Use   Vaping status: Never Used  Substance and Sexual Activity   Alcohol use: Not Currently    Comment: last used about 9 years ago   Drug use: Yes    Types: Marijuana    Comment: occ   Sexual activity: Yes    Partners: Female    Comment: declined condoms  Other Topics Concern   Not on file  Social History Narrative   Not on file   Social Determinants of Health   Financial Resource Strain: Not on file  Food Insecurity: Food Insecurity Present (05/28/2023)   Hunger Vital Sign    Worried About Running Out of Food in the Last Year: Patient unable to answer    Ran Out of Food in the Last Year: Sometimes true  Transportation Needs: Unmet Transportation Needs (05/28/2023)   PRAPARE - Administrator, Civil Service (Medical): Yes    Lack of Transportation (Non-Medical): Yes  Physical Activity: Not on file  Stress: Not on file  Social Connections: Not on file   Additional Social History:                         Sleep: Fair  Appetite:  Good  Current Medications: Current Facility-Administered Medications  Medication Dose Route Frequency Provider Last Rate Last Admin   acetaminophen  (TYLENOL) tablet 650 mg  650 mg Oral Q6H PRN Jearld Lesch, NP   650 mg at 05/31/23 2119   alum & mag hydroxide-simeth (MAALOX/MYLANTA) 200-200-20 MG/5ML suspension 30 mL  30 mL Oral Q4H PRN Jearld Lesch, NP       amLODipine (NORVASC) tablet 5 mg  5 mg Oral Daily Geary, Puzio, DO   5 mg at 06/03/23 1610   apixaban (ELIQUIS) tablet 2.5 mg  2.5 mg Oral BID Sarina Ill, DO   2.5 mg at 06/03/23 0913   atorvastatin (LIPITOR) tablet 10 mg  10 mg Oral Daily Aditya, Szalkowski, DO   10 mg at 06/03/23 9604   docusate sodium (COLACE) capsule 100 mg  100 mg Oral BID Lathon, Braganza, DO   100 mg at 06/03/23 0908   elvitegravir-cobicistat-emtricitabine-tenofovir (GENVOYA) 150-150-200-10 MG tablet 1 tablet  1 tablet Oral Q breakfast Benoit, Pociask, DO   1 tablet at 06/03/23 5409   FLUoxetine (PROZAC) capsule 20 mg  20 mg Oral Daily Raidon, Goebel, DO   20 mg at 06/03/23 8119   furosemide (LASIX) tablet 40 mg  40 mg Oral Daily Zahin, Ohanlon, DO   40 mg at 06/03/23 0919   gabapentin (NEURONTIN) capsule 200 mg  200 mg Oral TID Sarina Ill, DO   200 mg at 06/03/23 1478   Ipratropium-Albuterol (COMBIVENT) respimat 1 puff  1 puff Inhalation Q6H PRN Sarina Ill, DO   1 puff at 06/03/23 2956   LORazepam (ATIVAN) tablet 1 mg  1 mg Oral Q4H PRN Sarina Ill, DO       magnesium hydroxide (MILK OF MAGNESIA) suspension 30 mL  30 mL Oral Daily PRN Jearld Lesch, NP       pantoprazole (PROTONIX) EC tablet 40 mg  40 mg Oral Daily Essey, Bressler, DO   40 mg at 06/03/23 2130   potassium chloride SA (KLOR-CON M) CR tablet 20 mEq  20 mEq Oral QPC breakfast Edword, Pridgeon, DO   20 mEq at 06/03/23 8657   risperiDONE (RISPERDAL) tablet 0.5 mg  0.5 mg Oral [id number] Harce, Cantu, DO   0.5 mg at 06/03/23 0736   risperiDONE (RISPERDAL) tablet 1 mg  1 mg Oral QHS Sarina Ill, DO   1 mg at  06/02/23 2104   traZODone (DESYREL) tablet 100 mg  100 mg Oral QHS Sarina Ill, DO   100 mg at 06/02/23 2104    Lab Results:  No results found for  this or any previous visit (from the past 48 hour(s)).   Blood Alcohol level:  Lab Results  Component Value Date   ETH <10 05/27/2023   ETH <10 05/07/2023    Metabolic Disorder Labs: Lab Results  Component Value Date   HGBA1C 5.8 (H) 06/01/2023   MPG 119.76 06/01/2023   MPG 120 02/20/2017   No results found for: "PROLACTIN" Lab Results  Component Value Date   CHOL 124 06/01/2023   TRIG 81 06/01/2023   HDL 40 (L) 06/01/2023   CHOLHDL 3.1 06/01/2023   VLDL 16 06/01/2023   LDLCALC 68 06/01/2023   LDLCALC  04/04/2022     Comment:     . LDL cholesterol not calculated. Triglyceride levels greater than 400 mg/dL invalidate calculated LDL results. . Reference range: <100 . Desirable range <100 mg/dL for primary prevention;   <70 mg/dL for patients with CHD or diabetic patients  with > or = 2 CHD risk factors. Marland Kitchen LDL-C is now calculated using the Martin-Hopkins  calculation, which is a validated novel method providing  better accuracy than the Friedewald equation in the  estimation of LDL-C.  Horald Pollen et al. Lenox Ahr. 2130;865(78): 2061-2068  (http://education.[facility].com/faq/FAQ164)     Physical Findings: AIMS:  , ,  ,  ,    CIWA:    COWS:     Musculoskeletal: Strength & Muscle Tone: within normal limits Gait & Station: normal Patient leans: N/A  Psychiatric Specialty Exam:  Presentation  General Appearance:  Appropriate  Eye Contact: Good  Speech: Normal rate and volume  Speech Volume: Normal  Handedness: Right   Mood and Affect  Mood: " Fine"  Affect: Mood congruent   Thought Processes: Goal directed  Descriptions of Associations:Intact  Orientation:Full (Time, Place and Person)  Thought Content:Improved, still believes his stuff has been missing, probably at  baseline   Hallucinations:Denies Ideas of Reference:None  Suicidal Thoughts:Denies Homicidal Thoughts:Denies  Sensorium  Memory: Fair  Judgment: Improving  Insight: Improving   Executive Functions  Concentration: Fair  Attention Span: Fair   Language: Fair   Psychomotor Activity  Psychomotor Activity:Normal  Assets  Assets: Communication Skills; Housing   Sleep  Sleep:Improved    Blood pressure 116/70, pulse 87, temperature 99.3 F (37.4 C), resp. rate 18, height 6\' 1"  (1.854 m), weight 78.5 kg, SpO2 95%. Body mass index is 22.82 kg/m.   Treatment Plan Summary: Daily contact with patient to assess and evaluate symptoms and progress in treatment and Medication management  Lewanda Rife, MD

## 2023-06-03 NOTE — Progress Notes (Signed)
   06/03/23 1500  Spiritual Encounters  Type of Visit Initial  Care provided to: Patient  Conversation partners present during encounter Nurse  Referral source Patient request  Reason for visit End-of-life  OnCall Visit No  Spiritual Framework  Presenting Themes Coping tools;Impactful experiences and emotions;Courage hope and growth;Significant life change  Community/Connection Family;Significant other  Patient Stress Factors Loss;Family relationships  Family Stress Factors Loss;Family relationships  Interventions  Spiritual Care Interventions Made Supported grief process;Bereavement/grief support;Reflective listening;Compassionate presence;Established relationship of care and support;Reconciliation with self/others  Intervention Outcomes  Outcomes Connection to spiritual care;Awareness around self/spiritual resourses;Awareness of support;Reduced isolation  Spiritual Care Plan  Spiritual Care Issues Still Outstanding No further spiritual care needs at this time (see row info)   Chaplain received a spiritual consult to come and speak with patient. Patient's sister passed away today from Cancer. The patient was very upset about it and was looking forward to being discharged. Patient shared that he and his sister had not spoken for almost a year because of disagreements about their mother's care taking needs. Patient stated that he knew she was sick but did not expect her to die so soon. Chaplain offered words of encouragement and hope and listened compassionately to the patient.

## 2023-06-03 NOTE — Progress Notes (Addendum)
Pt escorted from unit by staff  in a wheelchair . Pt verbalized understanding of discharge instructions. Pt left unit with all belongings. Pt denies suicidal or homicidal ideation

## 2023-06-03 NOTE — Discharge Summary (Signed)
Physician Discharge Summary Note  Patient:  Devin [name] is an 62 y.o., male MRN:  [id number] DOB:  [birth year] Patient phone:  [phone] (home)  Patient address:   [address] [place] [address],   Date of Admission:  05/28/2023 Date of Discharge: 06/03/2023  Reason for Admission:  [name] is a 62 year old white male who was involuntarily admitted to inpatient psychiatry after calling the police multiple times stating that there were intruders in his house but when the police arrived there was nobody there except for him and he had a gun with him. He tells me that from the emergency room he was sent to [facility] in a placement medication that he did not like and eventually was returned home. He says that the neighbors continue to intrude his house and he called the police again and they brought him to [facility]   Principal Problem: Acute psychosis (HCC) Discharge Diagnoses: Principal Problem:   Acute psychosis (HCC)    Past Medical History:  Past Medical History:  Diagnosis Date   Anxiety    Arthritis    "I'm eat up w/it" (02/20/2017)   Asthma    Chronic back pain    "the whole back" (02/20/2017)   Chronic bronchitis (HCC)    Complication of anesthesia    "felt like I couldn't breath coming out of it"   COPD (chronic obstructive pulmonary disease) (HCC)    Depression    GERD (gastroesophageal reflux disease)    History of hiatal hernia    History of stomach ulcers    "bleeding ones; I was young then"   HIV infection (HCC) dx'd ~ 1999   Hypertension    Pneumonia    "several times" (02/20/2017)   Prolapsed disk 10/28   Pulmonary embolism (HCC) 02/20/2017   Scoliosis 08/24/13    Past Surgical History:  Procedure Laterality Date   BACK SURGERY  2019   KNEE ARTHROSCOPY Right 1980s   Family History:  Family History  Problem Relation Age of Onset   Diabetes Father    Stroke Other    Colon cancer Neg Hx     Social History:  Social History   Substance and  Sexual Activity  Alcohol Use Not Currently   Comment: last used about 9 years ago     Social History   Substance and Sexual Activity  Drug Use Yes   Types: Marijuana   Comment: occ    Social History   Socioeconomic History   Marital status: Divorced    Spouse name: Not on file   Number of children: Not on file   Years of education: Not on file   Highest education level: Not on file  Occupational History   Not on file  Tobacco Use   Smoking status: Every Day    Current packs/day: 1.00    Average packs/day: 1 pack/day for 48.0 years (48.0 ttl pk-yrs)    Types: Cigarettes   Smokeless tobacco: Never  Vaping Use   Vaping status: Never Used  Substance and Sexual Activity   Alcohol use: Not Currently    Comment: last used about 9 years ago   Drug use: Yes    Types: Marijuana    Comment: occ   Sexual activity: Yes    Partners: Female    Comment: declined condoms  Other Topics Concern   Not on file  Social History Narrative   Not on file   Social Determinants of Health   Financial Resource Strain: Not on  file  Food Insecurity: Food Insecurity Present (05/28/2023)   Hunger Vital Sign    Worried About Running Out of Food in the Last Year: Patient unable to answer    Ran Out of Food in the Last Year: Sometimes true  Transportation Needs: Unmet Transportation Needs (05/28/2023)   PRAPARE - Administrator, Civil Service (Medical): Yes    Lack of Transportation (Non-Medical): Yes  Physical Activity: Not on file  Stress: Not on file  Social Connections: Not on file    Hospital Course:  ***  Physical Findings: AIMS:  , ,  ,  ,    CIWA:    COWS:     Musculoskeletal: Strength & Muscle Tone: within normal limits Gait & Station: normal Patient leans: N/A   Psychiatric Specialty Exam:   Presentation  General Appearance:  Appropriate   Eye Contact: Good   Speech: Normal rate and volume   Speech Volume: Normal   Handedness: Right     Mood and  Affect  Mood: " Fine"   Affect: Mood congruent     Thought Processes: Goal directed   Descriptions of Associations:Intact   Orientation:Full (Time, Place and Person)   Thought Content:Improved, still believes his stuff has been missing, probably at baseline     Hallucinations:Denies Ideas of Reference:None   Suicidal Thoughts:Denies Homicidal Thoughts:Denies   Sensorium  Memory: Fair   Judgment: Improving   Insight: Improving     Executive Functions  Concentration: Fair   Attention Span: Fair     Language: Fair     Psychomotor Activity  Psychomotor Activity:Normal   Assets  Assets: Communication Skills; Housing     Sleep  Sleep:Improved    `   Physical Exam Vitals and nursing note reviewed.  Constitutional:      Appearance: Normal appearance. He is normal weight.  HENT:     Head: Normocephalic and atraumatic.     Nose: Nose normal.       Eyes:     Extraocular Movements: Extraocular movements intact.     Pupils: Pupils are equal, round, and reactive to light.  Cardiovascular:     Rate and Rhythm: Normal rate and regular rhythm.      Pulmonary:     Effort: Pulmonary effort is normal.     Skin:    General: Skin is warm and dry.  Neurological:     General: No focal deficit present.     Mental Status: He is alert and oriented to person, place, and time.      Review of Systems  Constitutional: Negative.   HENT: Negative.    Eyes: Negative.   Respiratory: Negative.    Cardiovascular: Negative.   Gastrointestinal: Negative.   Genitourinary: Negative.   Musculoskeletal:Chronic pain.   Skin: Negative.   Neurological: Negative.     Blood pressure 116/70, pulse 87, temperature 99.3 F (37.4 C), resp. rate 18, height 6\' 1"  (1.854 m), weight 78.5 kg, SpO2 95%. Body mass index is 22.82 kg/m.   Social History   Tobacco Use  Smoking Status Every Day   Current packs/day: 1.00   Average packs/day: 1 pack/day for 48.0 years (48.0  ttl pk-yrs)   Types: Cigarettes  Smokeless Tobacco Never   Tobacco Cessation:  A prescription for an FDA-approved tobacco cessation medication was offered at discharge and the patient refused   Blood Alcohol level:  Lab Results  Component Value Date   ETH <10 05/27/2023   ETH <10 05/07/2023  Metabolic Disorder Labs:  Lab Results  Component Value Date   HGBA1C 5.8 (H) 06/01/2023   MPG 119.76 06/01/2023   MPG 120 02/20/2017   No results found for: "PROLACTIN" Lab Results  Component Value Date   CHOL 124 06/01/2023   TRIG 81 06/01/2023   HDL 40 (L) 06/01/2023   CHOLHDL 3.1 06/01/2023   VLDL 16 06/01/2023   LDLCALC 68 06/01/2023   LDLCALC  04/04/2022     Comment:     . LDL cholesterol not calculated. Triglyceride levels greater than 400 mg/dL invalidate calculated LDL results. . Reference range: <100 . Desirable range <100 mg/dL for primary prevention;   <70 mg/dL for patients with CHD or diabetic patients  with > or = 2 CHD risk factors. Marland Kitchen LDL-C is now calculated using the Martin-Hopkins  calculation, which is a validated novel method providing  better accuracy than the Friedewald equation in the  estimation of LDL-C.  Horald Pollen et al. Lenox Ahr. 2956;213(08): 2061-2068  (http://education.[facility].com/faq/FAQ164)     See Psychiatric Specialty Exam and Suicide Risk Assessment completed by Attending Physician prior to discharge.  Discharge destination:  Home  Is patient on multiple antipsychotic therapies at discharge:  No   Recommended Plan for Multiple Antipsychotic Therapies: NA   Allergies as of 06/03/2023   No Known Allergies      Medication List     STOP taking these medications    amitriptyline 50 MG tablet Commonly known as: ELAVIL   Breztri Aerosphere 160-9-4.8 MCG/ACT Aero Generic drug: Budeson-Glycopyrrol-Formoterol   calcium carbonate 750 MG chewable tablet Commonly known as: TUMS EX   cholecalciferol 25 MCG (1000 UNIT)  tablet Commonly known as: VITAMIN D3   cyclobenzaprine 10 MG tablet Commonly known as: FLEXERIL   diazepam 10 MG tablet Commonly known as: VALIUM   divalproex 250 MG DR tablet Commonly known as: DEPAKOTE   Multi For Him 50+ Tabs   oxyCODONE 15 mg 12 hr tablet Commonly known as: OXYCONTIN   Oxycodone HCl 10 MG Tabs   pregabalin 300 MG capsule Commonly known as: LYRICA   Qvar RediHaler 80 MCG/ACT inhaler Generic drug: beclomethasone   rosuvastatin 5 MG tablet Commonly known as: CRESTOR       TAKE these medications      Indication  albuterol 108 (90 Base) MCG/ACT inhaler Commonly known as: VENTOLIN HFA Inhale 2 puffs into the lungs every 4 (four) hours as needed for wheezing or shortness of breath. What changed: Another medication with the same name was removed. Continue taking this medication, and follow the directions you see here.    amLODipine 5 MG tablet Commonly known as: NORVASC Take 1 tablet (5 mg total) by mouth daily. Start taking on: June 04, 2023 What changed: when to take this    apixaban 2.5 MG Tabs tablet Commonly known as: ELIQUIS Take 1 tablet (2.5 mg total) by mouth 2 (two) times daily. What changed:  medication strength how much to take    atorvastatin 10 MG tablet Commonly known as: LIPITOR Take 1 tablet (10 mg total) by mouth daily.    Combivent Respimat 20-100 MCG/ACT Aers respimat Generic drug: Ipratropium-Albuterol Inhale 2 puffs into the lungs every 4 (four) hours as needed. wheezing    docusate sodium 100 MG capsule Commonly known as: COLACE Take 1 capsule (100 mg total) by mouth 2 (two) times daily. What changed: when to take this    elvitegravir-cobicistat-emtricitabine-tenofovir 150-150-200-10 MG Tabs tablet Commonly known as: GENVOYA Take 1 tablet by mouth daily  with breakfast. Start taking on: June 04, 2023 What changed: Another medication with the same name was removed. Continue taking this medication, and follow the  directions you see here.    FLUoxetine 20 MG capsule Commonly known as: PROZAC Take 1 capsule (20 mg total) by mouth daily. Start taking on: June 04, 2023    furosemide 40 MG tablet Commonly known as: LASIX Take 1 tablet (40 mg total) by mouth daily. Start taking on: June 04, 2023 What changed:  when to take this reasons to take this    gabapentin 100 MG capsule Commonly known as: NEURONTIN Take 2 capsules (200 mg total) by mouth 3 (three) times daily.    pantoprazole 40 MG tablet Commonly known as: PROTONIX Take 1 tablet (40 mg total) by mouth daily.    potassium chloride SA 20 MEQ tablet Commonly known as: KLOR-CON M Take 1 tablet (20 mEq total) by mouth daily after breakfast. Start taking on: June 04, 2023    risperiDONE 0.5 MG tablet Commonly known as: RISPERDAL Take 1 tablet (0.5 mg total) by mouth 2 (two) times daily at 8 am and 4 pm.    risperiDONE 1 MG tablet Commonly known as: RISPERDAL Take 1 tablet (1 mg total) by mouth at bedtime.    traZODone 100 MG tablet Commonly known as: DESYREL Take 1 tablet (100 mg total) by mouth at bedtime.         Follow-up Information     [facility] Call.   Why: Although you declined a follow-up appointment, I am including some information in your discharge packet on [facility], which provides an Integrated Behavioral Health experience. They provide therapy and some medication management through a primary care provider. Contact information: [address] [place] [address] [phone]         [facility] Follow up.   Why: Although you declined a follow-up appointment, I am including some information in your discharge packet on [facility], which provides an Integrated Behavioral Health experience. They provide therapy and some medication management through a primary care provider. Contact information: [address] [place]  [address] [phone]                  Signed: Lewanda Rife, MD 06/03/2023, 2:54 PM

## 2023-06-03 NOTE — Progress Notes (Signed)
  [facility] Adult Case Management Discharge Plan :  Will you be returning to the same living situation after discharge:  Yes,  pt will be returning to his home  At discharge, do you have transportation home?: Yes,  pt's daughter will pick him up from hospital  Do you have the ability to pay for your medications: Yes,  [employer] MEDICARE / Suan Halter DUAL COMPLETE  Release of information consent forms completed and in the chart;  Patient's signature needed at discharge.  Patient to Follow up at:  Follow-up Information     [facility] Call.   Why: Although you declined a follow-up appointment, I am including some information in your discharge packet on [facility], which provides an Integrated Behavioral Health experience. They provide therapy and some medication management through a primary care provider. Contact information: [address] [place] [address] [phone]         [facility] Follow up.   Why: Although you declined a follow-up appointment, I am including some information in your discharge packet on [facility], which provides an Integrated Behavioral Health experience. They provide therapy and some medication management through a primary care provider. Contact information: [address] [place] [address] [phone]                 Next level of care provider has access to [facility] Link:no  Safety Planning and Suicide Prevention discussed: Yes,  discussed with pt's daughter, Genia Del      Has patient been referred to the Quitline?: Patient refused referral for treatment  Patient has been referred for addiction treatment: Patient refused referral for treatment.  [address] LCSWA 06/03/2023, 2:24 PM

## 2023-06-03 NOTE — BHH Suicide Risk Assessment (Signed)
[facility] Discharge Suicide Risk Assessment   Principal Problem: Acute psychosis (HCC) Discharge Diagnoses: Principal Problem:   Acute psychosis (HCC)   Musculoskeletal: Strength & Muscle Tone: within normal limits Gait & Station: normal Patient leans: N/A   Psychiatric Specialty Exam:   Presentation  General Appearance:  Appropriate   Eye Contact: Good   Speech: Normal rate and volume   Speech Volume: Normal   Handedness: Right     Mood and Affect  Mood: " Fine"   Affect: Mood congruent     Thought Processes: Goal directed   Descriptions of Associations:Intact   Orientation:Full (Time, Place and Person)   Thought Content:Improved, still believes his stuff has been missing, probably at baseline     Hallucinations:Denies Ideas of Reference:None   Suicidal Thoughts:Denies Homicidal Thoughts:Denies   Sensorium  Memory: Fair   Judgment: Improving   Insight: Improving     Executive Functions  Concentration: Fair   Attention Span: Fair     Language: Fair     Psychomotor Activity  Psychomotor Activity:Normal   Assets  Assets: Communication Skills; Housing     Sleep  Sleep:Improved    `   Physical Exam Vitals and nursing note reviewed.  Constitutional:      Appearance: Normal appearance. He is normal weight.  HENT:     Head: Normocephalic and atraumatic.     Nose: Nose normal.       Eyes:     Extraocular Movements: Extraocular movements intact.     Pupils: Pupils are equal, round, and reactive to light.  Cardiovascular:     Rate and Rhythm: Normal rate and regular rhythm.      Pulmonary:     Effort: Pulmonary effort is normal.     Skin:    General: Skin is warm and dry.  Neurological:     General: No focal deficit present.     Mental Status: He is alert and oriented to person, place, and time.      Review of Systems  Constitutional: Negative.   HENT: Negative.    Eyes: Negative.   Respiratory: Negative.     Cardiovascular: Negative.   Gastrointestinal: Negative.   Genitourinary: Negative.   Musculoskeletal:Chronic pain.   Skin: Negative.   Neurological: Negative.    Blood pressure 116/70, pulse 87, temperature 99.3 F (37.4 C), resp. rate 18, height 6\' 1"  (1.854 m), weight 78.5 kg, SpO2 95%. Body mass index is 22.82 kg/m.   Demographic Factors:  Male, Low socioeconomic status, Living alone, and Unemployed  Loss Factors: Decrease in vocational status, Decline in physical health, and Financial problems/change in socioeconomic status  Historical Factors: Impulsivity  Risk Reduction Factors:   Positive social support and Positive therapeutic relationship  Continued Clinical Symptoms:  Previous Psychiatric Diagnoses and Treatments     Suicide Risk:  Minimal: No identifiable suicidal ideation.     Follow-up Information     [facility] Call.   Why: Although you declined a follow-up appointment, I am including some information in your discharge packet on [facility], which provides an Integrated Behavioral Health experience. They provide therapy and some medication management through a primary care provider. Contact information: [address] [place] [address] [phone]         [facility] Follow up.   Why: Although you declined a follow-up appointment, I am including some information in your discharge packet on [facility], which provides an Integrated Behavioral Health experience. They  provide therapy and some medication management through a primary care provider. Contact information: [address] [place] [address] [phone]                 Per discharge summary  Lewanda Rife, MD 06/03/2023, 3:02 PM

## 2023-06-03 NOTE — BHH Counselor (Signed)
CSW contacted pt's daughter Genia Del [phone]) to discuss guardianship process per Sharolyn Douglas and Cristal Deer Buckner's request.   CSW unable to reach, LVM requesting return call.   Reynaldo Minium, MSW, [place] 06/03/2023 12:17 PM

## 2023-06-03 NOTE — Care Management Important Message (Signed)
Important Message  Patient Details  Name: Devin [name] MRN: [id number] Date of Birth: [birth year]   Medicare Important Message Given:  Yes     Elza Rafter, LCSWA 06/03/2023, 2:26 PM

## 2023-06-10 ENCOUNTER — Other Ambulatory Visit (HOSPITAL_COMMUNITY): Payer: Self-pay

## 2023-06-10 ENCOUNTER — Other Ambulatory Visit: Payer: Self-pay | Admitting: Infectious Diseases

## 2023-06-10 DIAGNOSIS — B20 Human immunodeficiency virus [HIV] disease: Secondary | ICD-10-CM

## 2023-06-11 ENCOUNTER — Other Ambulatory Visit (HOSPITAL_COMMUNITY): Payer: Self-pay

## 2023-06-11 ENCOUNTER — Other Ambulatory Visit: Payer: Self-pay

## 2023-06-11 MED ORDER — GENVOYA 150-150-200-10 MG PO TABS
1.0000 | ORAL_TABLET | Freq: Every day | ORAL | 5 refills | Status: DC
Start: 2023-06-11 — End: 2023-06-26
  Filled 2023-06-11: qty 30, 30d supply, fill #0

## 2023-06-13 ENCOUNTER — Emergency Department
Admission: EM | Admit: 2023-06-13 | Discharge: 2023-06-17 | Disposition: A | Discharge status: Other Acute Inpatient Hospital | Payer: 59 | Attending: Emergency Medicine | Admitting: Emergency Medicine

## 2023-06-13 ENCOUNTER — Other Ambulatory Visit: Payer: Self-pay

## 2023-06-13 ENCOUNTER — Encounter: Payer: Self-pay | Admitting: Emergency Medicine

## 2023-06-13 DIAGNOSIS — Z79899 Other long term (current) drug therapy: Secondary | ICD-10-CM | POA: Diagnosis not present

## 2023-06-13 DIAGNOSIS — F1510 Other stimulant abuse, uncomplicated: Secondary | ICD-10-CM | POA: Diagnosis not present

## 2023-06-13 DIAGNOSIS — F29 Unspecified psychosis not due to a substance or known physiological condition: Secondary | ICD-10-CM | POA: Diagnosis not present

## 2023-06-13 DIAGNOSIS — R4689 Other symptoms and signs involving appearance and behavior: Secondary | ICD-10-CM | POA: Diagnosis present

## 2023-06-13 LAB — CBC
HCT: 40.2 % (ref 39.0–52.0)
Hemoglobin: 13.8 g/dL (ref 13.0–17.0)
MCH: 32 pg (ref 26.0–34.0)
MCHC: 34.3 g/dL (ref 30.0–36.0)
MCV: 93.3 fL (ref 80.0–100.0)
Platelets: 257 10*3/uL (ref 150–400)
RBC: 4.31 MIL/uL (ref 4.22–5.81)
RDW: 11.5 % (ref 11.5–15.5)
WBC: 7.2 10*3/uL (ref 4.0–10.5)
nRBC: 0 % (ref 0.0–0.2)

## 2023-06-13 LAB — COMPREHENSIVE METABOLIC PANEL
ALT: 15 U/L (ref 0–44)
AST: 20 U/L (ref 15–41)
Albumin: 4 g/dL (ref 3.5–5.0)
Alkaline Phosphatase: 63 U/L (ref 38–126)
Anion gap: 11 (ref 5–15)
BUN: 9 mg/dL (ref 6–20)
CO2: 26 mmol/L (ref 22–32)
Calcium: 9.7 mg/dL (ref 8.9–10.3)
Chloride: 103 mmol/L (ref 98–111)
Creatinine, Ser: 1.25 mg/dL — ABNORMAL HIGH (ref 0.61–1.24)
GFR, Estimated: >60 mL/min (ref >60)
Glucose, Bld: 102 mg/dL — ABNORMAL HIGH (ref 70–99)
Potassium: 3.6 mmol/L (ref 3.5–5.1)
Sodium: 140 mmol/L (ref 135–145)
Total Bilirubin: 0.8 mg/dL (ref 0.3–1.2)
Total Protein: 7.3 g/dL (ref 6.5–8.1)

## 2023-06-13 LAB — ETHANOL: Alcohol, Ethyl (B): <10 mg/dL (ref <10)

## 2023-06-13 NOTE — ED Notes (Signed)
This NT asked pt for a urine sample, pt stated he didn't have to use the restroom and he would like to talk to a doctor about his situation.
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
## 2023-06-13 NOTE — ED Notes (Signed)
Pt has not provided a Urine sample yet, previous entry in error

## 2023-06-13 NOTE — ED Notes (Signed)
Pm snack and beverage provided.

## 2023-06-13 NOTE — ED Notes (Signed)
RN entered pt's room. Pt is lying on his left side soundly sleeping at this time. No distress noted.
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
## 2023-06-13 NOTE — ED Notes (Signed)
IVC pending consult   

## 2023-06-13 NOTE — ED Notes (Signed)
TTS counselor at bedside. 

## 2023-06-13 NOTE — ED Notes (Signed)
Pt dressed out with this EDT and [employer]  [employer]  White underwear  White with pink dots underwear  Tan underwear

## 2023-06-14 ENCOUNTER — Emergency Department: Payer: 59

## 2023-06-14 DIAGNOSIS — F15159 Other stimulant abuse with stimulant-induced psychotic disorder, unspecified: Secondary | ICD-10-CM | POA: Diagnosis not present

## 2023-06-14 NOTE — BH Assessment (Signed)
Per [facility] (Antoinette C.), patient to be referred out of system.  Referral information for Psychiatric Hospitalization faxed to;   [facility] [phone]- [phone])  [facility] [phone]- [phone]),   [facility] [phone]),  [place] [phone], [phone], [phone] or [phone]),   [place] [phone]--- [phone]--- [phone]--- [phone])  [address] [phone]),   [facility] [phone] -or- [phone]),   [facility] [phone] phone-- [phone])  [facility] [phone]),  [place] [phone])  [place] [phone] or [phone]),   [facility] [phone]).  [facility] [phone])

## 2023-06-14 NOTE — ED Notes (Signed)
IVC/Pending Disposition until Consult is completed

## 2023-06-14 NOTE — ED Notes (Signed)
Pt given cola to drink and vanilla ice cream for snack.

## 2023-06-14 NOTE — ED Notes (Signed)
Patient is IVC pending admit to behavior med

## 2023-06-14 NOTE — Consult Note (Signed)
[facility] Face-to-Face Psychiatry Consult   Reason for Consult:  IVC Referring Physician:   Patient Identification: Devin [name] MRN:  [id number] Principal Diagnosis: <principal problem not specified> Diagnosis:  Substance induced psychotic disorder Meth Abuse  Total Time spent with patient: 45 minutes    HPI:  Patient is a 62 year male presenting to [facility] ED under IVC. Per triage note Arrives with [facility] under IVC paperwork.  Papers state patient was carrying a .22 around his home , calling his kids stating that there are people coming into his house and drilling holes in the floors and walls.Patient denies hearing voices, states "I am having trespassers."  Denies SI/ HI. Patient describes the "boy" that has been into his house twice and that he has been staying under an outbuilding.  Describes this person,except his face, states "I have not seen his face." Patient reports " a girl has been taking vengeance on me, she knows everything about me, she has been taking money out of my bank account, she's ruining my life." "I lost $200, I want to get her in court for cyber stalking." Patient denies having any current outpatient psychiatrist. Patient is adamant that he needs to go home and believes that he is not hallucinating "I'm not crazy, please don't put me away." Patient denies SI/HI/AH/VH. The patient reportedly stated that he was going to start shooting around his neighbors until he finds out he was doing this.  Patient reportedly stated that he has nothing to live for.  Daughter reported that patient told her he was using crystal meth. His UDS is positive for Amphetamines and Benzos. Patient tells me that someone is out seeking "vengeance" against him and he thinks it is a woman that is trying to date him. Patient has been in his room in the emergency room most of the time was able to get some sleep and presents now in an anxious manner.  He claims that there is this woman who is trying to treat  him and has been hacking his phone his Google account and is trying to harass him.  He also believes there is someone coming into his house and is making holes in the walls and on the floor.  He claims that he saw him in his house and he ran away when he saw the patient.  Patient acknowledges that he has not been taking his medicines since he was discharged from here.  He also acknowledges using crystal meth that he has been getting from his neighbor.  Patient is presenting in an anxious and irritable mood, is denying current suicidal or homicidal ideations as well as any auditory or visual hallucinations.  It does appear that he has been hallucinating is currently paranoid and has poor insight.  Patient acknowledges having 0.22  rifle at home and denies that he was carrying it around.  He is insisting that he is fine and wants to return home because he has to pay bills.  He has not been sleeping well at home and it appears that he has self-care deficits.  Past Psychiatric History: He has been followed by [facility] outpatient in [place].  Psychosis   Risk to Self:   yes Risk to Others:  yes Prior Inpatient Therapy:  yes Prior Outpatient Therapy:  ?  Past Medical History:  Past Medical History:  Diagnosis Date   Anxiety    Arthritis    "I'm eat up w/it" (02/20/2017)   Asthma    Chronic back pain    "  the whole back" (02/20/2017)   Chronic bronchitis (HCC)    Complication of anesthesia    "felt like I couldn't breath coming out of it"   COPD (chronic obstructive pulmonary disease) (HCC)    Depression    GERD (gastroesophageal reflux disease)    History of hiatal hernia    History of stomach ulcers    "bleeding ones; I was young then"   HIV infection (HCC) dx'd ~ 1999   Hypertension    Pneumonia    "several times" (02/20/2017)   Prolapsed disk 10/28   Pulmonary embolism (HCC) 02/20/2017   Scoliosis 08/24/13    Past Surgical History:  Procedure Laterality Date   BACK SURGERY  2019   KNEE  ARTHROSCOPY Right 1980s   Family History:  Family History  Problem Relation Age of Onset   Diabetes Father    Stroke Other    Colon cancer Neg Hx    Family Psychiatric  History: Denies Social History:  Social History   Substance and Sexual Activity  Alcohol Use Not Currently   Comment: last used about 9 years ago     Social History   Substance and Sexual Activity  Drug Use Yes   Types: Marijuana   Comment: denies    Social History   Socioeconomic History   Marital status: Divorced    Spouse name: Not on file   Number of children: Not on file   Years of education: Not on file   Highest education level: Not on file  Occupational History   Not on file  Tobacco Use   Smoking status: Every Day    Current packs/day: 1.00    Average packs/day: 1 pack/day for 48.0 years (48.0 ttl pk-yrs)    Types: Cigarettes   Smokeless tobacco: Never  Vaping Use   Vaping status: Never Used  Substance and Sexual Activity   Alcohol use: Not Currently    Comment: last used about 9 years ago   Drug use: Yes    Types: Marijuana    Comment: denies   Sexual activity: Yes    Partners: Female    Comment: declined condoms  Other Topics Concern   Not on file  Social History Narrative   Not on file   Social Determinants of Health   Financial Resource Strain: Not on file  Food Insecurity: Food Insecurity Present (05/28/2023)   Hunger Vital Sign    Worried About Running Out of Food in the Last Year: Patient unable to answer    Ran Out of Food in the Last Year: Sometimes true  Transportation Needs: Unmet Transportation Needs (05/28/2023)   PRAPARE - Administrator, Civil Service (Medical): Yes    Lack of Transportation (Non-Medical): Yes  Physical Activity: Not on file  Stress: Not on file  Social Connections: Not on file   Additional Social History:    Allergies:  No Known Allergies  Labs:  Results for orders placed or performed during the hospital encounter of  06/13/23 (from the past 48 hour(s))  Comprehensive metabolic panel     Status: Abnormal   Collection Time: 06/13/23  3:38 PM  Result Value Ref Range   Sodium 140 135 - 145 mmol/L   Potassium 3.6 3.5 - 5.1 mmol/L   Chloride 103 98 - 111 mmol/L   CO2 26 22 - 32 mmol/L   Glucose, Bld 102 (H) 70 - 99 mg/dL    Comment: Glucose reference range applies only to samples taken after fasting for  at least 8 hours.   BUN 9 6 - 20 mg/dL   Creatinine, Ser 8.46 (H) 0.61 - 1.24 mg/dL   Calcium 9.7 8.9 - 96.2 mg/dL   Total Protein 7.3 6.5 - 8.1 g/dL   Albumin 4.0 3.5 - 5.0 g/dL   AST 20 15 - 41 U/L   ALT 15 0 - 44 U/L   Alkaline Phosphatase 63 38 - 126 U/L   Total Bilirubin 0.8 0.3 - 1.2 mg/dL   GFR, Estimated >95 >28 mL/min    Comment: (NOTE) Calculated using the CKD-EPI Creatinine Equation (2021)    Anion gap 11 5 - 15    Comment: Performed at [facility], [address] [place], [place] [address]  Ethanol     Status: None   Collection Time: 06/13/23  3:38 PM  Result Value Ref Range   Alcohol, Ethyl (B) <10 <10 mg/dL    Comment: (NOTE) Lowest detectable limit for serum alcohol is 10 mg/dL.  For medical purposes only. Performed at [facility], [address] [place], [place] [address]   cbc     Status: None   Collection Time: 06/13/23  3:38 PM  Result Value Ref Range   WBC 7.2 4.0 - 10.5 K/uL   RBC 4.31 4.22 - 5.81 MIL/uL   Hemoglobin 13.8 13.0 - 17.0 g/dL   HCT 72.5 36.6 - 44.0 %   MCV 93.3 80.0 - 100.0 fL   MCH 32.0 26.0 - 34.0 pg   MCHC 34.3 30.0 - 36.0 g/dL   RDW 34.7 42.5 - 95.6 %   Platelets 257 150 - 400 K/uL   nRBC 0.0 0.0 - 0.2 %    Comment: Performed at [facility], [address] [place], [place] [address]    No current facility-administered medications for this encounter.   Current Outpatient Medications  Medication Sig Dispense Refill   albuterol (PROVENTIL HFA;VENTOLIN HFA) 108 (90 BASE) MCG/ACT inhaler Inhale 2 puffs into the  lungs every 4 (four) hours as needed for wheezing or shortness of breath.     amLODipine (NORVASC) 5 MG tablet Take 1 tablet (5 mg total) by mouth daily. 30 tablet 0   apixaban (ELIQUIS) 2.5 MG TABS tablet Take 1 tablet (2.5 mg total) by mouth 2 (two) times daily. 60 tablet 0   atorvastatin (LIPITOR) 10 MG tablet Take 1 tablet (10 mg total) by mouth daily. 30 tablet 0   COMBIVENT RESPIMAT 20-100 MCG/ACT AERS respimat Inhale 2 puffs into the lungs every 4 (four) hours as needed. wheezing     docusate sodium (COLACE) 100 MG capsule Take 1 capsule (100 mg total) by mouth 2 (two) times daily. 10 capsule 0   elvitegravir-cobicistat-emtricitabine-tenofovir (GENVOYA) 150-150-200-10 MG TABS tablet Take 1 tablet by mouth daily with breakfast. 30 tablet 5   FLUoxetine (PROZAC) 20 MG capsule Take 1 capsule (20 mg total) by mouth daily. 30 capsule 0   furosemide (LASIX) 40 MG tablet Take 1 tablet (40 mg total) by mouth daily. 30 tablet 0   gabapentin (NEURONTIN) 100 MG capsule Take 2 capsules (200 mg total) by mouth 3 (three) times daily. 90 capsule 0   pantoprazole (PROTONIX) 40 MG tablet Take 1 tablet (40 mg total) by mouth daily. 15 tablet 0   potassium chloride SA (KLOR-CON M) 20 MEQ tablet Take 1 tablet (20 mEq total) by mouth daily after breakfast. 15 tablet 1   risperiDONE (RISPERDAL) 0.5 MG tablet Take 1 tablet (0.5 mg total) by mouth 2 (  two) times daily at 8 am and 4 pm. 60 tablet 0   risperiDONE (RISPERDAL) 1 MG tablet Take 1 tablet (1 mg total) by mouth at bedtime. 30 tablet 0   traZODone (DESYREL) 100 MG tablet Take 1 tablet (100 mg total) by mouth at bedtime. 30 tablet 0    Musculoskeletal: Strength & Muscle Tone: within normal limits Gait & Station: normal Patient leans: Right            Psychiatric Specialty Exam:  Presentation  General Appearance:  Disheveled  Eye Contact: Fair  Speech: Pressured  Speech Volume: Normal  Handedness: Right   Mood and Affect   Mood: Anxious; Irritable  Affect: Restricted   Thought Process  Thought Processes: Disorganized  Descriptions of Associations:Loose  Orientation:Partial  Thought Content:Delusions; Paranoid Ideation; Scattered  History of Schizophrenia/Schizoaffective disorder:No  Duration of Psychotic Symptoms:Less than six months  Hallucinations:Hallucinations: Auditory; Visual  Ideas of Reference:Paranoia; Delusions  Suicidal Thoughts:Suicidal Thoughts: No  Homicidal Thoughts:Homicidal Thoughts: No   Sensorium  Memory: Immediate Fair; Remote Fair  Judgment: Poor  Insight: Poor   Executive Functions  Concentration: Poor  Attention Span: Fair  Recall: Poor  Fund of Knowledge: Fair  Language: Fair   Psychomotor Activity  Psychomotor Activity:Psychomotor Activity: Restlessness   Assets  Assets: Financial Resources/Insurance; Social Support; Housing   Sleep  Sleep:Sleep: Poor   Physical Exam: Physical Exam Constitutional:      Appearance: Normal appearance. He is normal weight.  HENT:     Head: Normocephalic and atraumatic.     Nose: Nose normal.     Mouth/Throat:     Mouth: Mucous membranes are moist.  Eyes:     Extraocular Movements: Extraocular movements intact.     Conjunctiva/sclera: Conjunctivae normal.     Pupils: Pupils are equal, round, and reactive to light.  Cardiovascular:     Rate and Rhythm: Normal rate.     Pulses: Normal pulses.     Heart sounds: Normal heart sounds.  Abdominal:     General: Abdomen is flat. Bowel sounds are normal.     Palpations: Abdomen is soft.  Musculoskeletal:        General: Normal range of motion.     Cervical back: Normal range of motion.  Neurological:     General: No focal deficit present.     Mental Status: He is alert.    Review of Systems  Constitutional: Negative.   HENT: Negative.    Eyes: Negative.   Respiratory: Negative.    Cardiovascular: Negative.   Gastrointestinal: Negative.    Genitourinary: Negative.   Musculoskeletal: Negative.   Skin: Negative.   Neurological: Negative.   Endo/Heme/Allergies: Negative.   Psychiatric/Behavioral:  Positive for hallucinations and substance abuse. The patient is nervous/anxious and has insomnia.    Blood pressure (!) 150/109, pulse (!) 102, temperature 98 F (36.7 C), temperature source Oral, resp. rate 20, SpO2 97%. There is no height or weight on file to calculate BMI.  Treatment Plan Summary: Plan : Patient has not been compliant with his medicines and has been abusing drugs( meth). He is currently psychotic and has been making threats to shoot people and has guns. He is assessed to be at a high risk and needs inpatient psych treatment for safety and stabilization. He will be admitted to an accepting facility involuntarily.   Disposition: Recommend psychiatric Inpatient admission when medically cleared.  Leo Rod 06/14/2023 11:24 AM

## 2023-06-15 MED ORDER — TRAZODONE HCL 100 MG PO TABS
100.0000 mg | ORAL_TABLET | Freq: Every day | ORAL | Status: DC
  Administered 2023-06-15 – 2023-06-16 (×2): 100 mg via ORAL
  Filled 2023-06-15 (×2): qty 1

## 2023-06-15 MED ORDER — APIXABAN 2.5 MG PO TABS
2.5000 mg | ORAL_TABLET | Freq: Two times a day (BID) | ORAL | Status: DC
  Administered 2023-06-15 – 2023-06-17 (×5): 2.5 mg via ORAL
  Filled 2023-06-15 (×6): qty 1

## 2023-06-15 MED ORDER — LIDOCAINE 5 % EX PTCH
1.0000 | MEDICATED_PATCH | CUTANEOUS | Status: DC
  Administered 2023-06-15 – 2023-06-17 (×3): 1 via TRANSDERMAL
  Filled 2023-06-15 (×3): qty 1

## 2023-06-15 MED ORDER — FLUOXETINE HCL 20 MG PO CAPS
20.0000 mg | ORAL_CAPSULE | Freq: Every day | ORAL | Status: DC
  Administered 2023-06-15 – 2023-06-17 (×3): 20 mg via ORAL
  Filled 2023-06-15 (×3): qty 1

## 2023-06-15 MED ORDER — AMLODIPINE BESYLATE 5 MG PO TABS
5.0000 mg | ORAL_TABLET | Freq: Every day | ORAL | Status: DC
  Administered 2023-06-15 – 2023-06-17 (×3): 5 mg via ORAL
  Filled 2023-06-15 (×3): qty 1

## 2023-06-15 MED ORDER — BUDESONIDE 0.25 MG/2ML IN SUSP: 2.0000 mL | Freq: Two times a day (BID) | RESPIRATORY_TRACT | Status: DC

## 2023-06-15 MED ORDER — RISPERIDONE 1 MG PO TABS
1.0000 mg | ORAL_TABLET | Freq: Every day | ORAL | Status: DC
  Administered 2023-06-15 – 2023-06-16 (×2): 1 mg via ORAL
  Filled 2023-06-15 (×2): qty 1

## 2023-06-15 MED ORDER — FLUTICASONE PROPIONATE HFA 44 MCG/ACT IN AERO
1.0000 | INHALATION_SPRAY | Freq: Two times a day (BID) | RESPIRATORY_TRACT | Status: DC
  Administered 2023-06-15 – 2023-06-17 (×5): 1 via RESPIRATORY_TRACT
  Filled 2023-06-15: qty 1

## 2023-06-15 MED ORDER — RISPERIDONE 1 MG PO TABS
0.5000 mg | ORAL_TABLET | ORAL | Status: DC
  Administered 2023-06-15 – 2023-06-17 (×5): 0.5 mg via ORAL
  Filled 2023-06-15 (×5): qty 1

## 2023-06-15 MED ORDER — ACETAMINOPHEN 325 MG PO TABS
650.0000 mg | ORAL_TABLET | Freq: Four times a day (QID) | ORAL | Status: DC | PRN
  Administered 2023-06-15: 650 mg via ORAL
  Filled 2023-06-15: qty 2

## 2023-06-15 MED ORDER — ELVITEG-COBIC-EMTRICIT-TENOFAF 150-150-200-10 MG PO TABS
1.0000 | ORAL_TABLET | Freq: Every day | ORAL | Status: DC
  Administered 2023-06-16 – 2023-06-17 (×2): 1 via ORAL
  Filled 2023-06-15 (×2): qty 1

## 2023-06-15 NOTE — ED Notes (Signed)
Pt argumentative with staff about phone when this RN brought in medication. Pt continues to argue about phone when this RN is attempting to explain medication to patient. Pt eventually takes medication cup out of this RN hand forcefully and takes medication and states "Let me tell you if nobody does what they say they are going to theres going to be some problems". This RN attempted to explain phone hours begin again at 5 as patient has had phone twice during allotted times today. Pt continues to argue with this RN, this RN removed self from situation to prevent further escalation.

## 2023-06-15 NOTE — ED Provider Notes (Signed)
Emergency Medicine Observation Re-evaluation Note  Physical Exam   BP (!) 146/89   Pulse 92   Temp 97.9 F (36.6 C) (Oral)   Resp 18   SpO2 96%   Patient appears in no acute distress.  ED Course / MDM   No reported events during my shift at the time of this note.   Pt is awaiting dispo from SW   Pilar Jarvis MD    Pilar Jarvis, MD 06/15/23 [phone]

## 2023-06-15 NOTE — ED Notes (Signed)
Hospital meal provided, pt tolerated w/o complaints.  Waste discarded appropriately.  

## 2023-06-15 NOTE — ED Notes (Signed)
Pt urinal emptied at this time- 1000cc output

## 2023-06-15 NOTE — ED Notes (Signed)
IVC/pending psych inpatient admission 

## 2023-06-15 NOTE — ED Notes (Signed)
This NT provided pt with pm snack. 

## 2023-06-15 NOTE — BH Assessment (Signed)
TTS continue to look for psychiatric inpatient treatment beds but unable to secure one.

## 2023-06-15 NOTE — ED Notes (Signed)
Pt given phone 

## 2023-06-15 NOTE — ED Notes (Signed)
Pt provided dinner tray and beverage, waste thrown away.

## 2023-06-16 LAB — URINE DRUG SCREEN, QUALITATIVE (ARMC ONLY)
Amphetamines, Ur Screen: POSITIVE — AB
Barbiturates, Ur Screen: NOT DETECTED
Benzodiazepine, Ur Scrn: POSITIVE — AB
Cannabinoid 50 Ng, Ur ~~LOC~~: NOT DETECTED
Cocaine Metabolite,Ur ~~LOC~~: NOT DETECTED
MDMA (Ecstasy)Ur Screen: NOT DETECTED
Methadone Scn, Ur: NOT DETECTED
Opiate, Ur Screen: NOT DETECTED
Phencyclidine (PCP) Ur S: NOT DETECTED
Tricyclic, Ur Screen: NOT DETECTED

## 2023-06-16 NOTE — ED Notes (Signed)
Spoke with patients daughter to give her an update on pt. Pt's daughter requests that she be notified when pt receives placement.

## 2023-06-16 NOTE — ED Provider Notes (Signed)
Emergency Medicine Observation Re-evaluation Note  Physical Exam   BP (!) 138/90   Pulse 90   Temp 98 F (36.7 C)   Resp 18   SpO2 100%   Patient appears in no acute distress.  ED Course / MDM   No reported events during my shift at the time of this note.   Pt is awaiting dispo from SW   Pilar Jarvis MD    Pilar Jarvis, MD 06/16/23 [phone]

## 2023-06-16 NOTE — ED Notes (Signed)
IVC/Pending Psych Inpt. Admit

## 2023-06-16 NOTE — ED Notes (Signed)
Patient given the phone

## 2023-06-16 NOTE — ED Notes (Signed)
IVC/  PENDING  PLACEMENT 

## 2023-06-17 ENCOUNTER — Other Ambulatory Visit: Payer: Self-pay

## 2023-06-17 ENCOUNTER — Encounter: Payer: Self-pay | Admitting: Psychiatry

## 2023-06-17 ENCOUNTER — Inpatient Hospital Stay
Admission: AD | Admit: 2023-06-17 | Discharge: 2023-06-26 | DRG: 896 | Disposition: A | Discharge status: Home / Self Care | Payer: 59 | Source: Intra-hospital | Attending: Psychiatry | Admitting: Psychiatry

## 2023-06-17 DIAGNOSIS — Z823 Family history of stroke: Secondary | ICD-10-CM

## 2023-06-17 DIAGNOSIS — J1282 Pneumonia due to coronavirus disease 2019: Secondary | ICD-10-CM | POA: Diagnosis not present

## 2023-06-17 DIAGNOSIS — G8929 Other chronic pain: Secondary | ICD-10-CM | POA: Diagnosis present

## 2023-06-17 DIAGNOSIS — K219 Gastro-esophageal reflux disease without esophagitis: Secondary | ICD-10-CM | POA: Diagnosis present

## 2023-06-17 DIAGNOSIS — F17210 Nicotine dependence, cigarettes, uncomplicated: Secondary | ICD-10-CM | POA: Diagnosis present

## 2023-06-17 DIAGNOSIS — F321 Major depressive disorder, single episode, moderate: Secondary | ICD-10-CM | POA: Diagnosis present

## 2023-06-17 DIAGNOSIS — F15159 Other stimulant abuse with stimulant-induced psychotic disorder, unspecified: Secondary | ICD-10-CM | POA: Diagnosis present

## 2023-06-17 DIAGNOSIS — Z7951 Long term (current) use of inhaled steroids: Secondary | ICD-10-CM

## 2023-06-17 DIAGNOSIS — F411 Generalized anxiety disorder: Secondary | ICD-10-CM | POA: Diagnosis present

## 2023-06-17 DIAGNOSIS — Z7901 Long term (current) use of anticoagulants: Secondary | ICD-10-CM

## 2023-06-17 DIAGNOSIS — M549 Dorsalgia, unspecified: Secondary | ICD-10-CM | POA: Diagnosis present

## 2023-06-17 DIAGNOSIS — F19959 Other psychoactive substance use, unspecified with psychoactive substance-induced psychotic disorder, unspecified: Principal | ICD-10-CM | POA: Diagnosis present

## 2023-06-17 DIAGNOSIS — Z8711 Personal history of peptic ulcer disease: Secondary | ICD-10-CM

## 2023-06-17 DIAGNOSIS — J440 Chronic obstructive pulmonary disease with acute lower respiratory infection: Secondary | ICD-10-CM | POA: Diagnosis not present

## 2023-06-17 DIAGNOSIS — Z833 Family history of diabetes mellitus: Secondary | ICD-10-CM | POA: Diagnosis not present

## 2023-06-17 DIAGNOSIS — M419 Scoliosis, unspecified: Secondary | ICD-10-CM | POA: Diagnosis present

## 2023-06-17 DIAGNOSIS — Z79899 Other long term (current) drug therapy: Secondary | ICD-10-CM

## 2023-06-17 DIAGNOSIS — F1290 Cannabis use, unspecified, uncomplicated: Secondary | ICD-10-CM | POA: Diagnosis present

## 2023-06-17 DIAGNOSIS — I10 Essential (primary) hypertension: Secondary | ICD-10-CM | POA: Diagnosis present

## 2023-06-17 DIAGNOSIS — Z5982 Transportation insecurity: Secondary | ICD-10-CM

## 2023-06-17 DIAGNOSIS — Z91148 Patient's other noncompliance with medication regimen for other reason: Secondary | ICD-10-CM | POA: Diagnosis not present

## 2023-06-17 DIAGNOSIS — Z86711 Personal history of pulmonary embolism: Secondary | ICD-10-CM | POA: Diagnosis not present

## 2023-06-17 DIAGNOSIS — Z634 Disappearance and death of family member: Secondary | ICD-10-CM

## 2023-06-17 DIAGNOSIS — U071 COVID-19: Secondary | ICD-10-CM | POA: Diagnosis not present

## 2023-06-17 DIAGNOSIS — I420 Dilated cardiomyopathy: Secondary | ICD-10-CM | POA: Diagnosis present

## 2023-06-17 DIAGNOSIS — Z5941 Food insecurity: Secondary | ICD-10-CM | POA: Diagnosis not present

## 2023-06-17 DIAGNOSIS — Z21 Asymptomatic human immunodeficiency virus [HIV] infection status: Secondary | ICD-10-CM | POA: Diagnosis present

## 2023-06-17 MED ORDER — LIDOCAINE 5 % EX PTCH
1.0000 | MEDICATED_PATCH | CUTANEOUS | Status: DC
  Administered 2023-06-18 – 2023-06-25 (×8): 1 via TRANSDERMAL
  Filled 2023-06-17 (×8): qty 1

## 2023-06-17 MED ORDER — RISPERIDONE 1 MG PO TABS
0.5000 mg | ORAL_TABLET | ORAL | Status: DC
  Administered 2023-06-18: 0.5 mg via ORAL
  Filled 2023-06-17: qty 1

## 2023-06-17 MED ORDER — MAGNESIUM HYDROXIDE 400 MG/5ML PO SUSP: 15.0000 mL | Freq: Every day | ORAL | Status: DC | PRN

## 2023-06-17 MED ORDER — DIPHENHYDRAMINE HCL 25 MG PO CAPS: 50.0000 mg | ORAL_CAPSULE | Freq: Three times a day (TID) | ORAL | Status: DC | PRN

## 2023-06-17 MED ORDER — HALOPERIDOL 5 MG PO TABS: 5.0000 mg | ORAL_TABLET | Freq: Three times a day (TID) | ORAL | Status: DC | PRN

## 2023-06-17 MED ORDER — DIPHENHYDRAMINE HCL 50 MG/ML IJ SOLN: 50.0000 mg | Freq: Three times a day (TID) | INTRAMUSCULAR | Status: DC | PRN

## 2023-06-17 MED ORDER — ELVITEG-COBIC-EMTRICIT-TENOFAF 150-150-200-10 MG PO TABS
1.0000 | ORAL_TABLET | Freq: Every day | ORAL | Status: DC
  Administered 2023-06-18 – 2023-06-26 (×9): 1 via ORAL
  Filled 2023-06-17 (×9): qty 1

## 2023-06-17 MED ORDER — RISPERIDONE 1 MG PO TABS
1.0000 mg | ORAL_TABLET | Freq: Every day | ORAL | Status: DC
  Administered 2023-06-17: 1 mg via ORAL
  Filled 2023-06-17: qty 1

## 2023-06-17 MED ORDER — HALOPERIDOL LACTATE 5 MG/ML IJ SOLN: 5.0000 mg | Freq: Three times a day (TID) | INTRAMUSCULAR | Status: DC | PRN

## 2023-06-17 MED ORDER — FLUTICASONE PROPIONATE HFA 44 MCG/ACT IN AERO: 1.0000 | INHALATION_SPRAY | Freq: Two times a day (BID) | RESPIRATORY_TRACT | Status: DC

## 2023-06-17 MED ORDER — APIXABAN 2.5 MG PO TABS
2.5000 mg | ORAL_TABLET | Freq: Two times a day (BID) | ORAL | Status: DC
  Administered 2023-06-18 – 2023-06-26 (×17): 2.5 mg via ORAL
  Filled 2023-06-17 (×20): qty 1

## 2023-06-17 MED ORDER — FLUOXETINE HCL 20 MG PO CAPS
20.0000 mg | ORAL_CAPSULE | Freq: Every day | ORAL | Status: DC
  Administered 2023-06-18: 20 mg via ORAL
  Filled 2023-06-17: qty 1

## 2023-06-17 MED ORDER — HYDROXYZINE HCL 25 MG PO TABS
25.0000 mg | ORAL_TABLET | Freq: Three times a day (TID) | ORAL | Status: DC | PRN
  Administered 2023-06-20 – 2023-06-25 (×3): 25 mg via ORAL
  Filled 2023-06-17 (×4): qty 1

## 2023-06-17 MED ORDER — ALUM & MAG HYDROXIDE-SIMETH 200-200-20 MG/5ML PO SUSP: 30.0000 mL | ORAL | Status: DC | PRN

## 2023-06-17 MED ORDER — TRAZODONE HCL 100 MG PO TABS
100.0000 mg | ORAL_TABLET | Freq: Every day | ORAL | Status: DC
  Administered 2023-06-17 – 2023-06-18 (×2): 100 mg via ORAL
  Filled 2023-06-17 (×3): qty 1

## 2023-06-17 MED ORDER — AMLODIPINE BESYLATE 5 MG PO TABS
5.0000 mg | ORAL_TABLET | Freq: Every day | ORAL | Status: DC
  Administered 2023-06-18 – 2023-06-22 (×5): 5 mg via ORAL
  Filled 2023-06-17 (×6): qty 1

## 2023-06-17 MED ORDER — LORAZEPAM 1 MG PO TABS
2.0000 mg | ORAL_TABLET | Freq: Three times a day (TID) | ORAL | Status: DC | PRN
  Administered 2023-06-18: 2 mg via ORAL
  Filled 2023-06-17: qty 1

## 2023-06-17 MED ORDER — LORAZEPAM 2 MG/ML IJ SOLN: 2.0000 mg | Freq: Three times a day (TID) | INTRAMUSCULAR | Status: DC | PRN

## 2023-06-17 MED ORDER — ACETAMINOPHEN 325 MG PO TABS
650.0000 mg | ORAL_TABLET | Freq: Four times a day (QID) | ORAL | Status: DC | PRN
  Administered 2023-06-20 – 2023-06-25 (×7): 650 mg via ORAL
  Filled 2023-06-17 (×7): qty 2

## 2023-06-17 NOTE — ED Provider Notes (Signed)
Emergency Medicine Observation Re-evaluation Note  Dmonte O Skiler Olden is a 62 y.o. male, seen on rounds today.  Pt initially presented to the ED for complaints of Mental Health Problem  Currently, the patient is is no acute distress. Denies any concerns at this time.  Physical Exam  Blood pressure 116/72, pulse 80, temperature 97.9 F (36.6 C), temperature source Oral, resp. rate 18, SpO2 100%.  Physical Exam: General: No apparent distress Pulm: Normal WOB Neuro: Moving all extremities Psych: Resting comfortably     ED Course / MDM     I have reviewed the labs performed to date as well as medications administered while in observation.  Recent changes in the last 24 hours include: No acute events overnight.  Plan   Current plan: Patient awaiting psychiatric disposition. Patient is under full IVC at this time.    Maryfer Tauzin, Layla Maw, DO 06/17/23 [phone]

## 2023-06-17 NOTE — BH Assessment (Signed)
Adult MH  Referral information for Psychiatric Hospitalization faxed to:   [facility] ([phone]-or- [phone]),   [facility] ([phone]),   [facility] ([phone] -or- [phone]),   [facility] [phone] -or- [phone]) [phone]   [place] ([phone]---[phone]---[phone]),   [place] ([phone] or [phone])   [place] ([phone] or [phone]),   [place] ([phone]). 

## 2023-06-17 NOTE — ED Notes (Signed)
IVC  PT  GOING  TO  BEH MED  TONIGHT 06/17/23

## 2023-06-17 NOTE — BH Assessment (Signed)
Patient is to be admitted to [facility] BMU tonight 06/17/23 after 8:00pm by Dr.  Marlou Porch .  Attending Physician will be Dr. Marlou Porch.   Patient has been assigned to room 314, by [facility] Charge Nurse Linsey.     ER staff is aware of the admission: Misty Stanley, ER Secretary   Dr. Roxan Hockey, ER MD  Florentina Addison, Patient's Nurse  Rob, Patient Access.

## 2023-06-17 NOTE — ED Notes (Signed)
Patient received snack. Awaiting transfer to BMU. Pending report

## 2023-06-17 NOTE — ED Notes (Signed)
Report given to BMU. Patient belongings and IVC paperwork being sent with patient.

## 2023-06-17 NOTE — ED Notes (Signed)
ivc/pending placement.. 

## 2023-06-17 NOTE — ED Notes (Signed)
During nursing assessment Devin [name] was A/Ox 4 .He stated that he currently have thoughts or feelings of SI/HI, Devin [name] also reported he is not currently having auditory or visual hallucinations, Devin [name] does endorse increased feelings of remorse, sadness, and hopelessness.  Pt affect is flat , eye contact is good , speech is normal rate and volume  with appropriate verbiage noted.  Staff addressed any feelings or concerns that have been brought up.  Medications were administered as ordered. Continue to monitor patient as ordered for any changes in behaviors and for continued safety.

## 2023-06-17 NOTE — Progress Notes (Signed)
Admissions note:  Consents signed, handbook detailing the patient's rights, responsibilities, and visitor guidelines provided. Skin/belongings search completed and patient oriented to unit. Patient stable at this time. Patient given the opportunity to express concerns and ask questions. Patient given toiletries. RN will continue to monitor.    Pt denies SI/HI/AVH and verbally agrees to approach staff if these become apparent or before harming themselves/others. Rates depression 0/10. Rates anxiety 0/10. Rates pain 5/10, but chronic.Reports back pain- currently has lidocaine patch on back upon admission and bilateral "nerve" pain in his feet. Pt has poor insight into psychiatric diagnosis and reason for being admitted. Pt is calm and cooperative with staff and took all night time meds. Pt fixated that he is here "because of that stupid law" and that a previous friend was out to get him. Pt endorses that this said "friend" was jealous and used to be a "peeping tom" to him and his old partner. Pt endorses using meth about 1-2 times a week, but states he does not feel like it is an issue and is upset because his son referred to pt as "meth head". Pt reports desire to change to have the privilege of meeting and being around his grandchildren, which he reports his children have not let him be around due to the accusations above.   Scheduled medications administered to pt, per MD orders. RN provided support and encouragement to pt. Q15 min safety checks implemented and continued. Pt is safe on the unit. Plan of care on going and no other concerns expressed at this time.

## 2023-06-17 NOTE — ED Notes (Signed)
Pt received breakfast tray and beverage at this time.

## 2023-06-17 NOTE — ED Notes (Signed)
Hospital meal provided, pt tolerated w/o complaints.  Waste discarded appropriately.  

## 2023-06-17 NOTE — ED Notes (Signed)
Pt took morning medication w/o incident

## 2023-06-18 DIAGNOSIS — F19959 Other psychoactive substance use, unspecified with psychoactive substance-induced psychotic disorder, unspecified: Secondary | ICD-10-CM

## 2023-06-18 MED ORDER — DULOXETINE HCL 30 MG PO CPEP
30.0000 mg | ORAL_CAPSULE | Freq: Every day | ORAL | Status: DC
  Administered 2023-06-18 – 2023-06-20 (×3): 30 mg via ORAL
  Filled 2023-06-18 (×3): qty 1

## 2023-06-18 MED ORDER — GABAPENTIN 100 MG PO CAPS
200.0000 mg | ORAL_CAPSULE | Freq: Three times a day (TID) | ORAL | Status: DC
  Administered 2023-06-18 – 2023-06-26 (×23): 200 mg via ORAL
  Filled 2023-06-18 (×24): qty 2

## 2023-06-18 MED ORDER — POTASSIUM CHLORIDE CRYS ER 20 MEQ PO TBCR
20.0000 meq | EXTENDED_RELEASE_TABLET | Freq: Every day | ORAL | Status: DC
  Administered 2023-06-19 – 2023-06-26 (×8): 20 meq via ORAL
  Filled 2023-06-18 (×9): qty 1

## 2023-06-18 MED ORDER — NABUMETONE 500 MG PO TABS: 500.0000 mg | ORAL_TABLET | Freq: Two times a day (BID) | ORAL | Status: DC

## 2023-06-18 MED ORDER — PANTOPRAZOLE SODIUM 40 MG PO TBEC
40.0000 mg | DELAYED_RELEASE_TABLET | Freq: Every day | ORAL | Status: DC
  Administered 2023-06-18 – 2023-06-26 (×9): 40 mg via ORAL
  Filled 2023-06-18 (×9): qty 1

## 2023-06-18 MED ORDER — FUROSEMIDE 20 MG PO TABS
40.0000 mg | ORAL_TABLET | Freq: Every day | ORAL | Status: DC
  Administered 2023-06-18 – 2023-06-23 (×6): 40 mg via ORAL
  Filled 2023-06-18: qty 2
  Filled 2023-06-18: qty 1
  Filled 2023-06-18 (×2): qty 2
  Filled 2023-06-18: qty 1
  Filled 2023-06-18: qty 2

## 2023-06-18 MED ORDER — METHOCARBAMOL 500 MG PO TABS
500.0000 mg | ORAL_TABLET | Freq: Three times a day (TID) | ORAL | Status: DC
  Administered 2023-06-18 – 2023-06-26 (×23): 500 mg via ORAL
  Filled 2023-06-18 (×23): qty 1

## 2023-06-18 NOTE — Group Note (Signed)
Date:  06/18/2023 Time:  4:06 PM  Group Topic/Focus:  Goals Group:   The focus of this group is to help patients establish daily goals to achieve during treatment and discuss how the patient can incorporate goal setting into their daily lives to aide in recovery.  Progressive Muscle Relaxation   Participation Level:  Active  Participation Quality:  Appropriate, Attentive, and Supportive  Affect:  Appropriate  Cognitive:  Alert and Appropriate  Insight: Appropriate and Good  Engagement in Group:  Engaged and Supportive  Modes of Intervention:  Activity, Discussion, Education, and Support  Additional Comments:    Edmundo Tedesco A Daeshawn Redmann 06/18/2023, 4:06 PM

## 2023-06-18 NOTE — Progress Notes (Signed)
D- Patient alert and oriented. Patient presented in anxious, preoccupied, but fairly pleasant mood on assessment stating that he didn't sleep well due to his chronic back pain. Patient rated his pain an "8/10", but did not request any PRN medications to help with relief. Patient denied SI, HI, AVH at this time. Patient also denied any signs/symptoms of depression and anxiety. Patient was very fixated on his neighbors and stating that "I'm just gonna have  to stop opening my door. This is a vengeance charge they're doing to me". Patient is stating that he is worried about his bills, "I'm the only one to do it". Patient had no stated goals for today.  A- Scheduled medications administered to patient, per MD orders. Support and encouragement provided. Routine safety checks conducted every 15 minutes. Patient informed to notify staff with problems or concerns.  R- No adverse drug reactions noted. Patient contracts for safety at this time. Patient compliant with medications and treatment plan. Patient receptive, calm, and cooperative. Patient interacts well with others on the unit. Patient remains safe at this time.   06/18/23 1700  Psych Admission Type (Psych Patients Only)  Admission Status Involuntary  Psychosocial Assessment  Patient Complaints Insomnia  Eye Contact Fair  Facial Expression Anxious;Worried  Affect Preoccupied  Surveyor, minerals Activity Slow;Other (Comment) (chairfast; front-wheel walker PRN)  Appearance/Hygiene Disheveled;Poor hygiene;In scrubs  Behavior Characteristics Cooperative;Appropriate to situation  Mood Anxious;Preoccupied;Pleasant  Aggressive Behavior  Effect No apparent injury  Thought Process  Coherency Circumstantial  Content Blaming others;Preoccupation  Delusions Paranoid  Perception Derealization  Hallucination None reported or observed  Judgment Impaired  Confusion None  Danger to Self  Current suicidal ideation?  Denies  Agreement Not to Harm Self Yes  Description of Agreement Verbal

## 2023-06-18 NOTE — Progress Notes (Signed)
Pt calm and pleasant during assessment denying SI/HI/AVH. Pt observed interacting appropriately with staff and peers on the unit. Pt compliant with medication administration per MD orders. Pt given education, support, and encouragement to be active in his treatment plan. Pt being monitored Q 15 minutes for safety per unit protocol, remains safe on the unit. 

## 2023-06-18 NOTE — Plan of Care (Signed)
Pt compliant with procedures on the unit, denies SI/HI/AVH  Problem: Education: Goal: Knowledge of General Education information will improve Description: Including pain rating scale, medication(s)/side effects and non-pharmacologic comfort measures Outcome: Progressing   Problem: Health Behavior/Discharge Planning: Goal: Ability to manage health-related needs will improve Outcome: Progressing   Problem: Clinical Measurements: Goal: Ability to maintain clinical measurements within normal limits will improve Outcome: Progressing Goal: Will remain free from infection Outcome: Progressing Goal: Diagnostic test results will improve Outcome: Progressing Goal: Respiratory complications will improve Outcome: Progressing Goal: Cardiovascular complication will be avoided Outcome: Progressing   Problem: Activity: Goal: Risk for activity intolerance will decrease Outcome: Progressing   Problem: Nutrition: Goal: Adequate nutrition will be maintained Outcome: Progressing   Problem: Coping: Goal: Level of anxiety will decrease Outcome: Progressing   Problem: Elimination: Goal: Will not experience complications related to bowel motility Outcome: Progressing Goal: Will not experience complications related to urinary retention Outcome: Progressing   Problem: Pain Managment: Goal: General experience of comfort will improve Outcome: Progressing   Problem: Safety: Goal: Ability to remain free from injury will improve Outcome: Progressing   Problem: Skin Integrity: Goal: Risk for impaired skin integrity will decrease Outcome: Progressing   Problem: Education: Goal: Knowledge of [place] General Education information/materials will improve Outcome: Progressing Goal: Emotional status will improve Outcome: Progressing Goal: Mental status will improve Outcome: Progressing Goal: Verbalization of understanding the information provided will improve Outcome: Progressing    Problem: Activity: Goal: Interest or engagement in activities will improve Outcome: Progressing Goal: Sleeping patterns will improve Outcome: Progressing   Problem: Coping: Goal: Ability to verbalize frustrations and anger appropriately will improve Outcome: Progressing Goal: Ability to demonstrate self-control will improve Outcome: Progressing   Problem: Health Behavior/Discharge Planning: Goal: Identification of resources available to assist in meeting health care needs will improve Outcome: Progressing Goal: Compliance with treatment plan for underlying cause of condition will improve Outcome: Progressing   Problem: Physical Regulation: Goal: Ability to maintain clinical measurements within normal limits will improve Outcome: Progressing   Problem: Safety: Goal: Periods of time without injury will increase Outcome: Progressing
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
## 2023-06-18 NOTE — Evaluation (Signed)
Occupational Therapy Evaluation Patient Details Name: Devin [name] MRN: [id number] DOB: 12-Nov-1960 Today's Date: 06/18/2023   History of Present Illness Devin [name] is a 62 year old white male who was involuntarily admitted to inpatient psychiatry after calling the police multiple times on 05/27/2023, pt was admitted to geripsych and d/c on 06/03/23. Readmitted on 06/13/23 after he called the police again and they brought him to [facility]. He has multiple medical problems including HIV, and COPD.  He has not been on any of his medications because he cannot make it to his doctor's appointment.   Clinical Impression   Mr Devin [name] was seen for OT evaluation this date. Prior to hospital admission, pt was MOD I using SPC. Pt lives alone in home c 3 STE, reports plan for someone to come stay with him next month. Pt presents at near baseline ADL performance and limited functional mobility due to chronic back pain. Pt currently INDEPENDENT for bed mobility log rolling in bed and transferring bed<>w/c. Adjusted RW height and educated on transfer technique; compeltes sit<>stand and steps with no assist. Pt would benefit from skilled OT while in house to complete Pill Box assessment to address pt concerns for medication mgmt. Upon hospital discharge, recommend no OT follow up. Pt may benefit from supervision for medication mgmt pending completion of pill box assessment.    If plan is discharge home, recommend the following: Direct supervision/assist for medications management    Functional Status Assessment  Patient has had a recent decline in their functional status and demonstrates the ability to make significant improvements in function in a reasonable and predictable amount of time.  Equipment Recommendations  BSC/3in1;Wheelchair (measurements OT);Wheelchair cushion (measurements OT)    Recommendations for Other Services       Precautions / Restrictions Precautions Precautions: Back Restrictions Weight Bearing  Restrictions: No      Mobility Bed Mobility Overal bed mobility: Modified Independent             General bed mobility comments: L+R rolling for comfort to minimize back pain    Transfers Overall transfer level: Needs assistance Equipment used: Rolling walker (2 wheels) Transfers: Sit to/from Stand, Bed to chair/wheelchair/BSC Sit to Stand: Supervision Stand pivot transfers: Supervision         General transfer comment: pivot bed<>w/c, sit<>stand with RW      Balance Overall balance assessment: Needs assistance Sitting-balance support: No upper extremity supported, Feet supported Sitting balance-Leahy Scale: Normal     Standing balance support: Single extremity supported, Reliant on assistive device for balance Standing balance-Leahy Scale: Good                             ADL either performed or assessed with clinical judgement   ADL Overall ADL's : Needs assistance/impaired                                       General ADL Comments: MOD I for w/c transfer and seated dressing tasks      Pertinent Vitals/Pain Pain Assessment Pain Assessment: Faces Faces Pain Scale: Hurts even more Pain Location: back Pain Descriptors / Indicators: Discomfort, Grimacing, Constant Pain Intervention(s): Limited activity within patient's tolerance, Repositioned     Extremity/Trunk Assessment Upper Extremity Assessment Upper Extremity Assessment: Overall WFL for tasks assessed   Lower Extremity Assessment Lower Extremity Assessment: Generalized weakness  Communication Communication Communication: No apparent difficulties   Cognition Arousal: Alert Behavior During Therapy: WFL for tasks assessed/performed Overall Cognitive Status: No family/caregiver present to determine baseline cognitive functioning                                 General Comments: Distractable and tangential speech. Pleasant and agreeable to  treatment                Home Living Family/patient expects to be discharged to:: Private residence Living Arrangements: Alone     Home Access: Stairs to enter Entrance Stairs-Number of Steps: 3   Home Layout: One level     Bathroom Shower/Tub: Producer, television/film/video: Handicapped height     Home Equipment: Cane - single point          Prior Functioning/Environment Prior Level of Function : Independent/Modified Independent             Mobility Comments: using SPC for mobility ADLs Comments: pt states plan for "a lady friend" to come stay with him and provide assistance        OT Problem List: Pain;Decreased activity tolerance      OT Treatment/Interventions: Self-care/ADL training;Energy conservation;DME and/or AE instruction;Therapeutic activities;Therapeutic exercise    OT Goals(Current goals can be found in the care plan section) Acute Rehab OT Goals Patient Stated Goal: to go home OT Goal Formulation: With patient Time For Goal Achievement: 07/02/23 Potential to Achieve Goals: Fair ADL Goals Additional ADL Goal #1: Pt will complete Pill BOx Assessment with no cues  OT Frequency: Min 1X/week    Co-evaluation              AM-PAC OT "6 Clicks" Daily Activity     Outcome Measure Help from another person eating meals?: None Help from another person taking care of personal grooming?: None Help from another person toileting, which includes using toliet, bedpan, or urinal?: None Help from another person bathing (including washing, rinsing, drying)?: None Help from another person to put on and taking off regular upper body clothing?: None Help from another person to put on and taking off regular lower body clothing?: None 6 Click Score: 24   End of Session Equipment Utilized During Treatment: Rolling walker (2 wheels)  Activity Tolerance: Patient tolerated treatment well Patient left: in bed  OT Visit Diagnosis: Unsteadiness on feet  (R26.81);Muscle weakness (generalized) (M62.81)                Time: 8295-6213 OT Time Calculation (min): 27 min Charges:  OT General Charges $OT Visit: 1 Visit OT Evaluation $OT Eval Low Complexity: 1 Low OT Treatments $Self Care/Home Management : 8-22 mins  Kathie Dike, M.S. OTR/L  06/18/23, 2:59 PM  ascom [phone]

## 2023-06-18 NOTE — Group Note (Signed)
Recreation Therapy Group Note   Group Topic:Relaxation  Group Date: 06/18/2023 Start Time: 1000 End Time: 1100 Facilitators: Rosina Lowenstein, LRT, CTRS Location: Courtyard  Group Description: Meditation. LRT and patients discussed what they know about meditation and mindfulness. LRT played a Deep Breathing Meditation exercise script for patients to follow along to. LRT and patients discussed how meditation and deep breathing can be used as a coping skill post--discharge. After the meditation, LRT took song requests from pts to listen to while getting fresh air and sunlight.    Goal Area(s) Addressed: Patient will practice using relaxation technique. Patient will identify a new coping skill.  Patient will follow multistep directions to reduce anxiety and stress.   Affect/Mood: N/A   Participation Level: Did not attend    Clinical Observations/Individualized Feedback: Dionysios did not attend group.  Plan: Continue to engage patient in RT group sessions 2-3x/week.   Rosina Lowenstein, LRT, CTRS 06/18/2023 11:59 AM
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
## 2023-06-18 NOTE — Group Note (Signed)
Date:  06/18/2023 Time:  9:44 PM  Group Topic/Focus:  Developing a Wellness Toolbox:   The focus of this group is to help patients develop a "wellness toolbox" with skills and strategies to promote recovery upon discharge.    Participation Level:  Did Not Attend   Lenore Cordia 06/18/2023, 9:44 PM

## 2023-06-18 NOTE — Group Note (Signed)
Date:  06/18/2023 Time:  6:58 PM  Group Topic/Focus:  OUTDOOR RECREATION WELLNESS THROUGH MUSIC AND STRUCTURED ACTIVITY WHILE BUILDING THERAPEUTIC RAPPORT.    Participation Level:  Active  Participation Quality:  Appropriate and Attentive  Affect:  Appropriate  Cognitive:  Alert, Appropriate, and Oriented  Insight: Appropriate  Engagement in Group:  Developing/Improving, Engaged, and Supportive  Modes of Intervention:  Activity, Discussion, Rapport Building, and Socialization  Additional Comments:    Rosaura Carpenter 06/18/2023, 6:58 PM

## 2023-06-18 NOTE — Plan of Care (Signed)
  Problem: Education: Goal: Knowledge of General Education information will improve Description: Including pain rating scale, medication(s)/side effects and non-pharmacologic comfort measures Outcome: Not Progressing   Problem: Health Behavior/Discharge Planning: Goal: Ability to manage health-related needs will improve Outcome: Not Progressing   Problem: Clinical Measurements: Goal: Ability to maintain clinical measurements within normal limits will improve Outcome: Not Progressing Goal: Will remain free from infection Outcome: Not Progressing Goal: Diagnostic test results will improve Outcome: Not Progressing Goal: Respiratory complications will improve Outcome: Not Progressing Goal: Cardiovascular complication will be avoided Outcome: Not Progressing   Problem: Activity: Goal: Risk for activity intolerance will decrease Outcome: Not Progressing   Problem: Nutrition: Goal: Adequate nutrition will be maintained Outcome: Not Progressing   Problem: Coping: Goal: Level of anxiety will decrease Outcome: Not Progressing   Problem: Elimination: Goal: Will not experience complications related to bowel motility Outcome: Not Progressing Goal: Will not experience complications related to urinary retention Outcome: Not Progressing   Problem: Pain Managment: Goal: General experience of comfort will improve Outcome: Not Progressing   Problem: Safety: Goal: Ability to remain free from injury will improve Outcome: Not Progressing   Problem: Skin Integrity: Goal: Risk for impaired skin integrity will decrease Outcome: Not Progressing   Problem: Education: Goal: Knowledge of [place] [facility] Education information/materials will improve Outcome: Not Progressing Goal: Emotional status will improve Outcome: Not Progressing Goal: Mental status will improve Outcome: Not Progressing Goal: Verbalization of understanding the information provided will improve Outcome: Not  Progressing   Problem: Activity: Goal: Interest or engagement in activities will improve Outcome: Not Progressing Goal: Sleeping patterns will improve Outcome: Not Progressing   Problem: Coping: Goal: Ability to verbalize frustrations and anger appropriately will improve Outcome: Not Progressing Goal: Ability to demonstrate self-control will improve Outcome: Not Progressing   Problem: Health Behavior/Discharge Planning: Goal: Identification of resources available to assist in meeting health care needs will improve Outcome: Not Progressing Goal: Compliance with treatment plan for underlying cause of condition will improve Outcome: Not Progressing   Problem: Physical Regulation: Goal: Ability to maintain clinical measurements within normal limits will improve Outcome: Not Progressing   Problem: Safety: Goal: Periods of time without injury will increase Outcome: Not Progressing   

## 2023-06-18 NOTE — BH IP Treatment Plan (Signed)
Interdisciplinary Treatment and Diagnostic Plan Update  06/18/2023 Time of Session: 9:48AM Devin [name] MRN: [id number]  Principal Diagnosis: Psychoactive substance-induced psychosis (HCC)  Secondary Diagnoses: Principal Problem:   Psychoactive substance-induced psychosis (HCC)   Current Medications:  Current Facility-Administered Medications  Medication Dose Route Frequency Provider Last Rate Last Admin   acetaminophen (TYLENOL) tablet 650 mg  650 mg Oral Q6H PRN Bennett, Christal H, NP       alum & mag hydroxide-simeth (MAALOX/MYLANTA) 200-200-20 MG/5ML suspension 30 mL  30 mL Oral Q4H PRN Bennett, Christal H, NP       amLODipine (NORVASC) tablet 5 mg  5 mg Oral Daily Bennett, Christal H, NP   5 mg at 06/18/23 0932   apixaban (ELIQUIS) tablet 2.5 mg  2.5 mg Oral BID Bennett, Christal H, NP   2.5 mg at 06/18/23 0932   diphenhydrAMINE (BENADRYL) capsule 50 mg  50 mg Oral TID PRN Bennett, Christal H, NP       Or   diphenhydrAMINE (BENADRYL) injection 50 mg  50 mg Intramuscular TID PRN Bennett, Christal H, NP       elvitegravir-cobicistat-emtricitabine-tenofovir (GENVOYA) 150-150-200-10 MG tablet 1 tablet  1 tablet Oral Q breakfast Bennett, Christal H, NP   1 tablet at 06/18/23 0932   FLUoxetine (PROZAC) capsule 20 mg  20 mg Oral Daily Bennett, Christal H, NP   20 mg at 06/18/23 0932   haloperidol (HALDOL) tablet 5 mg  5 mg Oral TID PRN Bennett, Christal H, NP       Or   haloperidol lactate (HALDOL) injection 5 mg  5 mg Intramuscular TID PRN Bennett, Christal H, NP       hydrOXYzine (ATARAX) tablet 25 mg  25 mg Oral TID PRN Bennett, Christal H, NP       lidocaine (LIDODERM) 5 % 1 patch  1 patch Transdermal Q24H Bennett, Christal H, NP       LORazepam (ATIVAN) tablet 2 mg  2 mg Oral TID PRN Bennett, Christal H, NP       Or   LORazepam (ATIVAN) injection 2 mg  2 mg Intramuscular TID PRN Bennett, Christal H, NP       magnesium hydroxide (MILK OF MAGNESIA) suspension 15 mL  15 mL Oral Daily  PRN Bennett, Christal H, NP       risperiDONE (RISPERDAL) tablet 0.5 mg  0.5 mg Oral [id number] Bennett, Christal H, NP   0.5 mg at 06/18/23 0932   risperiDONE (RISPERDAL) tablet 1 mg  1 mg Oral QHS Bennett, Christal H, NP   1 mg at 06/17/23 2149   traZODone (DESYREL) tablet 100 mg  100 mg Oral QHS Bennett, Christal H, NP   100 mg at 06/17/23 2149   PTA Medications: Medications Prior to Admission  Medication Sig Dispense Refill Last Dose   albuterol (PROVENTIL HFA;VENTOLIN HFA) 108 (90 BASE) MCG/ACT inhaler Inhale 2 puffs into the lungs every 4 (four) hours as needed for wheezing or shortness of breath.      amLODipine (NORVASC) 5 MG tablet Take 1 tablet (5 mg total) by mouth daily. 30 tablet 0    apixaban (ELIQUIS) 2.5 MG TABS tablet Take 1 tablet (2.5 mg total) by mouth 2 (two) times daily. 60 tablet 0    atorvastatin (LIPITOR) 10 MG tablet Take 1 tablet (10 mg total) by mouth daily. 30 tablet 0    beclomethasone (QVAR) 80 MCG/ACT inhaler Inhale 1 puff into the lungs 2 (two) times daily.  COMBIVENT RESPIMAT 20-100 MCG/ACT AERS respimat Inhale 2 puffs into the lungs every 4 (four) hours as needed. wheezing      cyclobenzaprine (FLEXERIL) 10 MG tablet Take 10 mg by mouth 3 (three) times daily as needed for muscle spasms.      diazepam (VALIUM) 10 MG tablet Take 10 mg by mouth every 6 (six) hours as needed for anxiety.      docusate sodium (COLACE) 100 MG capsule Take 1 capsule (100 mg total) by mouth 2 (two) times daily. (Patient not taking: Reported on 06/16/2023) 10 capsule 0    elvitegravir-cobicistat-emtricitabine-tenofovir (GENVOYA) 150-150-200-10 MG TABS tablet Take 1 tablet by mouth daily with breakfast. 30 tablet 5    FLUoxetine (PROZAC) 20 MG capsule Take 1 capsule (20 mg total) by mouth daily. 30 capsule 0    furosemide (LASIX) 40 MG tablet Take 1 tablet (40 mg total) by mouth daily. 30 tablet 0    gabapentin (NEURONTIN) 100 MG capsule Take 2 capsules (200 mg total) by mouth 3 (three)  times daily. 90 capsule 0    pantoprazole (PROTONIX) 40 MG tablet Take 1 tablet (40 mg total) by mouth daily. 15 tablet 0    potassium chloride SA (KLOR-CON M) 20 MEQ tablet Take 1 tablet (20 mEq total) by mouth daily after breakfast. 15 tablet 1    risperiDONE (RISPERDAL) 0.5 MG tablet Take 1 tablet (0.5 mg total) by mouth 2 (two) times daily at 8 am and 4 pm. (Patient not taking: Reported on 06/15/2023) 60 tablet 0    risperiDONE (RISPERDAL) 1 MG tablet Take 1 tablet (1 mg total) by mouth at bedtime. (Patient not taking: Reported on 06/15/2023) 30 tablet 0    traZODone (DESYREL) 100 MG tablet Take 1 tablet (100 mg total) by mouth at bedtime. (Patient not taking: Reported on 06/15/2023) 30 tablet 0     Patient Stressors:    Patient Strengths:    Treatment Modalities: Medication Management, Group therapy, Case management,  1 to 1 session with clinician, Psychoeducation, Recreational therapy.   Physician Treatment Plan for Primary Diagnosis: Psychoactive substance-induced psychosis (HCC) Long Term Goal(s): Improvement in symptoms so as ready for discharge   Short Term Goals: Ability to identify changes in lifestyle to reduce recurrence of condition will improve Ability to verbalize feelings will improve Ability to disclose and discuss suicidal ideas Ability to demonstrate self-control will improve Ability to identify and develop effective coping behaviors will improve Ability to maintain clinical measurements within normal limits will improve Compliance with prescribed medications will improve Ability to identify triggers associated with substance abuse/mental health issues will improve  Medication Management: Evaluate patient's response, side effects, and tolerance of medication regimen.  Therapeutic Interventions: 1 to 1 sessions, Unit Group sessions and Medication administration.  Evaluation of Outcomes: Not Met  Physician Treatment Plan for Secondary Diagnosis: Principal Problem:    Psychoactive substance-induced psychosis (HCC)  Long Term Goal(s): Improvement in symptoms so as ready for discharge   Short Term Goals: Ability to identify changes in lifestyle to reduce recurrence of condition will improve Ability to verbalize feelings will improve Ability to disclose and discuss suicidal ideas Ability to demonstrate self-control will improve Ability to identify and develop effective coping behaviors will improve Ability to maintain clinical measurements within normal limits will improve Compliance with prescribed medications will improve Ability to identify triggers associated with substance abuse/mental health issues will improve     Medication Management: Evaluate patient's response, side effects, and tolerance of medication regimen.  Therapeutic Interventions:  1 to 1 sessions, Unit Group sessions and Medication administration.  Evaluation of Outcomes: Not Met   RN Treatment Plan for Primary Diagnosis: Psychoactive substance-induced psychosis (HCC) Long Term Goal(s): Knowledge of disease and therapeutic regimen to maintain health will improve  Short Term Goals: Ability to demonstrate self-control, Ability to participate in decision making will improve, Ability to verbalize feelings will improve, Ability to disclose and discuss suicidal ideas, Ability to identify and develop effective coping behaviors will improve, and Compliance with prescribed medications will improve  Medication Management: RN will administer medications as ordered by provider, will assess and evaluate patient's response and provide education to patient for prescribed medication. RN will report any adverse and/or side effects to prescribing provider.  Therapeutic Interventions: 1 on 1 counseling sessions, Psychoeducation, Medication administration, Evaluate responses to treatment, Monitor vital signs and CBGs as ordered, Perform/monitor CIWA, COWS, AIMS and Fall Risk screenings as ordered, Perform  wound care treatments as ordered.  Evaluation of Outcomes: Not Met   LCSW Treatment Plan for Primary Diagnosis: Psychoactive substance-induced psychosis (HCC) Long Term Goal(s): Safe transition to appropriate next level of care at discharge, Engage patient in therapeutic group addressing interpersonal concerns.  Short Term Goals: Engage patient in aftercare planning with referrals and resources, Increase social support, Increase ability to appropriately verbalize feelings, Increase emotional regulation, Facilitate acceptance of mental health diagnosis and concerns, and Increase skills for wellness and recovery  Therapeutic Interventions: Assess for all discharge needs, 1 to 1 time with Social worker, Explore available resources and support systems, Assess for adequacy in community support network, Educate family and significant other(s) on suicide prevention, Complete Psychosocial Assessment, Interpersonal group therapy.  Evaluation of Outcomes: Not Met   Progress in Treatment: Attending groups: No. Participating in groups: No. Taking medication as prescribed: Yes. Toleration medication: Yes. Family/Significant other contact made: No, will contact:  once permission has been given. Patient understands diagnosis: Yes. Discussing patient identified problems/goals with staff: Yes. Medical problems stabilized or resolved: Yes. Denies suicidal/homicidal ideation: Yes. Issues/concerns per patient self-inventory: No. Other: none  New problem(s) identified: No, Describe:  none  New Short Term/Long Term Goal(s): detox, elimination of symptoms of psychosis, medication management for mood stabilization; elimination of SI thoughts; development of comprehensive mental wellness/sobriety plan.   Patient Goals:  "that up to me"  Discharge Plan or Barriers: CSW to assist in the development of appropriate discharge plans.   Reason for Continuation of Hospitalization: Anxiety Depression Medical  Issues Medication stabilization Suicidal ideation  Estimated Length of Stay:  1-7 days  Last 3 [place] Suicide Severity Risk Score: Flowsheet Row Admission (Current) from 06/17/2023 in [facility] INPATIENT BEHAV[facility]AL MEDICINE ED from 06/13/2023 in [facility] Emergency Department at [facility] Admission (Discharged) from 05/28/2023 in [facility]  C-SSRS RISK CATEGORY No Risk No Risk No Risk       Last PHQ 2/9 Scores:    04/04/2022    3:06 PM 05/17/2021   10:33 AM 05/08/2021    3:15 PM  Depression screen PHQ 2/9  Decreased Interest 0 3 0  Down, Depressed, Hopeless 1 3 1   PHQ - 2 Score 1 6 1   Altered sleeping  3   Tired, decreased energy  3   Change in appetite  3   Feeling bad or failure about yourself   3   Trouble concentrating  3   Moving slowly or fidgety/restless  3   Suicidal thoughts  1   PHQ-9 Score  25   Difficult doing work/chores  Very difficult     Scribe for Treatment Team: Gaylan Gerold 06/18/2023 12:34 PM

## 2023-06-18 NOTE — Plan of Care (Signed)
  Problem: Nutrition: Goal: Adequate nutrition will be maintained Outcome: Progressing   Problem: Coping: Goal: Level of anxiety will decrease Outcome: Progressing   Problem: Education: Goal: Emotional status will improve Outcome: Progressing Goal: Verbalization of understanding the information provided will improve Outcome: Not Progressing

## 2023-06-19 DIAGNOSIS — F19959 Other psychoactive substance use, unspecified with psychoactive substance-induced psychotic disorder, unspecified: Secondary | ICD-10-CM | POA: Diagnosis not present

## 2023-06-19 MED ORDER — ENSURE ENLIVE PO LIQD
237.0000 mL | Freq: Two times a day (BID) | ORAL | Status: DC
  Administered 2023-06-20 – 2023-06-26 (×11): 237 mL via ORAL

## 2023-06-19 MED ORDER — OLANZAPINE 5 MG PO TABS
5.0000 mg | ORAL_TABLET | Freq: Every day | ORAL | Status: DC
  Administered 2023-06-19: 5 mg via ORAL
  Filled 2023-06-19: qty 1

## 2023-06-19 MED ORDER — TRAZODONE HCL 100 MG PO TABS: 100.0000 mg | ORAL_TABLET | Freq: Every evening | ORAL | Status: DC | PRN

## 2023-06-19 MED ORDER — RISPERIDONE 1 MG PO TABS
0.5000 mg | ORAL_TABLET | ORAL | Status: DC
  Administered 2023-06-19 – 2023-06-26 (×15): 0.5 mg via ORAL
  Filled 2023-06-19 (×13): qty 1
  Filled 2023-06-19: qty 0.5
  Filled 2023-06-19: qty 1

## 2023-06-19 NOTE — Group Note (Signed)
Recreation Therapy Group Note   Group Topic:Communication  Group Date: 06/19/2023 Start Time: 1400 End Time: 1450 Facilitators: Rosina Lowenstein, LRT, CTRS Location: Courtyard  Group Description: Reminisce Cards. Patients drew a laminated card out of a bag that had a word or phrase on it. Pt encouraged to speak about a time in their life or fond memory that specifically relates to the word they chose out of the bag. An example would be: "parenthood, meals, siblings, travel, or home".  LRT prompted following questions and encouraged contribution from peers to increase communication.   Goal Area(s) Addressed: Patient will increase verbal communication by conversing with peers. Patient will contribute to group discussion with minimal prompting. Patient will reminisce a positive memory or moment in their life.   Affect/Mood: Appropriate   Participation Level: Active and Engaged   Participation Quality: Independent   Behavior: Calm and Cooperative   Speech/Thought Process: Loose association   Insight: Fair   Judgement: Fair    Modes of Intervention: Guided Discussion   Patient Response to Interventions:  Attentive and Receptive   Education Outcome:  Acknowledges education   Clinical Observations/Individualized Feedback: Maxson was active in their participation of session activities and group discussion. 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 Pt discussed how his neighbor is the reason he is in here and who "called the law" on him after 8 years of not speaking. Pt was pleasant when interacting with LRT and peers.  Plan: Continue to engage patient in RT group sessions 2-3x/week.   Rosina Lowenstein, LRT, CTRS 06/19/2023 3:06 PM

## 2023-06-19 NOTE — Progress Notes (Signed)
[facility] MD Progress Note  06/19/2023 1:52 PM Devin [name]  MRN:  [id number] Subjective: Chez is seen on rounds.  He was voluntarily admitted 2 days ago through the emergency room after police were called because he was calling his son making threatening statements and also having a gun present when they arrived.  He denies any suicidal or homicidal ideation.  He also has been using methamphetamine.  It does not sound like he followed up when he was discharged but he does tell me that his sister died the day that he was discharged from here last time.  He also has a history of HIV.  He lives in [place] that I was not the one who discharged him but I think it was supposed to follow up with [facility] but I will have to check on that. Principal Problem: Psychoactive substance-induced psychosis (HCC) Diagnosis: Principal Problem:   Psychoactive substance-induced psychosis (HCC)  Total Time spent with patient: 15 minutes  Past Psychiatric History: This is his third hospitalization in the last year.  Past Medical History:  Past Medical History:  Diagnosis Date   Anxiety    Arthritis    "I'm eat up w/it" (02/20/2017)   Asthma    Chronic back pain    "the whole back" (02/20/2017)   Chronic bronchitis (HCC)    Complication of anesthesia    "felt like I couldn't breath coming out of it"   COPD (chronic obstructive pulmonary disease) (HCC)    Depression    GERD (gastroesophageal reflux disease)    History of hiatal hernia    History of stomach ulcers    "bleeding ones; I was young then"   HIV infection (HCC) dx'd ~ 1999   Hypertension    Pneumonia    "several times" (02/20/2017)   Prolapsed disk 10/28   Pulmonary embolism (HCC) 02/20/2017   Scoliosis 08/24/13    Past Surgical History:  Procedure Laterality Date   BACK SURGERY  2019   KNEE ARTHROSCOPY Right 1980s   Family History:  Family History  Problem Relation Age of Onset   Diabetes Father    Stroke Other    Colon cancer Neg  Hx    Family Psychiatric  History: Unremarkable Social History:  Social History   Substance and Sexual Activity  Alcohol Use Not Currently   Comment: last used about 9 years ago     Social History   Substance and Sexual Activity  Drug Use Yes   Types: Marijuana   Comment: denies    Social History   Socioeconomic History   Marital status: Divorced    Spouse name: Not on file   Number of children: Not on file   Years of education: Not on file   Highest education level: Not on file  Occupational History   Not on file  Tobacco Use   Smoking status: Every Day    Current packs/day: 1.00    Average packs/day: 1 pack/day for 48.0 years (48.0 ttl pk-yrs)    Types: Cigarettes   Smokeless tobacco: Never  Vaping Use   Vaping status: Never Used  Substance and Sexual Activity   Alcohol use: Not Currently    Comment: last used about 9 years ago   Drug use: Yes    Types: Marijuana    Comment: denies   Sexual activity: Yes    Partners: Female    Comment: declined condoms  Other Topics Concern   Not on file  Social History Narrative  Not on file   Social Determinants of Health   Financial Resource Strain: Not on file  Food Insecurity: Food Insecurity Present (06/17/2023)   Hunger Vital Sign    Worried About Running Out of Food in the Last Year: Sometimes true    Ran Out of Food in the Last Year: Never true  Transportation Needs: No Transportation Needs (06/17/2023)   PRAPARE - Administrator, Civil Service (Medical): No    Lack of Transportation (Non-Medical): No  Recent Concern: Transportation Needs - Unmet Transportation Needs (05/28/2023)   PRAPARE - Administrator, Civil Service (Medical): Yes    Lack of Transportation (Non-Medical): Yes  Physical Activity: Not on file  Stress: Not on file  Social Connections: Not on file   Additional Social History:                         Sleep: Good  Appetite:  Good  Current  Medications: Current Facility-Administered Medications  Medication Dose Route Frequency Provider Last Rate Last Admin   acetaminophen (TYLENOL) tablet 650 mg  650 mg Oral Q6H PRN Bennett, Christal H, NP       alum & mag hydroxide-simeth (MAALOX/MYLANTA) 200-200-20 MG/5ML suspension 30 mL  30 mL Oral Q4H PRN Bennett, Christal H, NP       amLODipine (NORVASC) tablet 5 mg  5 mg Oral Daily Bennett, Christal H, NP   5 mg at 06/19/23 0850   apixaban (ELIQUIS) tablet 2.5 mg  2.5 mg Oral BID Bennett, Christal H, NP   2.5 mg at 06/19/23 4098   diphenhydrAMINE (BENADRYL) capsule 50 mg  50 mg Oral TID PRN Bennett, Christal H, NP       Or   diphenhydrAMINE (BENADRYL) injection 50 mg  50 mg Intramuscular TID PRN Bennett, Christal H, NP       DULoxetine (CYMBALTA) DR capsule 30 mg  30 mg Oral Daily Charm Rings, NP   30 mg at 06/19/23 0847   elvitegravir-cobicistat-emtricitabine-tenofovir (GENVOYA) 150-150-200-10 MG tablet 1 tablet  1 tablet Oral Q breakfast Willeen Cass, Christal H, NP   1 tablet at 06/19/23 0849   feeding supplement (ENSURE ENLIVE / ENSURE PLUS) liquid 237 mL  237 mL Oral BID BM Sarina Ill, DO       furosemide (LASIX) tablet 40 mg  40 mg Oral Daily Charm Rings, NP   40 mg at 06/19/23 0850   gabapentin (NEURONTIN) capsule 200 mg  200 mg Oral TID Charm Rings, NP   200 mg at 06/19/23 1255   haloperidol (HALDOL) tablet 5 mg  5 mg Oral TID PRN Bennett, Christal H, NP       Or   haloperidol lactate (HALDOL) injection 5 mg  5 mg Intramuscular TID PRN Bennett, Christal H, NP       hydrOXYzine (ATARAX) tablet 25 mg  25 mg Oral TID PRN Bennett, Christal H, NP       lidocaine (LIDODERM) 5 % 1 patch  1 patch Transdermal Q24H Bennett, Christal H, NP   1 patch at 06/18/23 2113   LORazepam (ATIVAN) tablet 2 mg  2 mg Oral TID PRN Thurston Hole H, NP   2 mg at 06/18/23 2112   Or   LORazepam (ATIVAN) injection 2 mg  2 mg Intramuscular TID PRN Bennett, Christal H, NP       magnesium  hydroxide (MILK OF MAGNESIA) suspension 15 mL  15 mL Oral Daily  PRN Willeen Cass, Christal H, NP       methocarbamol (ROBAXIN) tablet 500 mg  500 mg Oral TID Charm Rings, NP   500 mg at 06/19/23 1256   OLANZapine (ZYPREXA) tablet 5 mg  5 mg Oral QHS Sarina Ill, DO       pantoprazole (PROTONIX) EC tablet 40 mg  40 mg Oral Daily Charm Rings, NP   40 mg at 06/19/23 0851   potassium chloride SA (KLOR-CON M) CR tablet 20 mEq  20 mEq Oral QPC breakfast Charm Rings, NP   20 mEq at 06/19/23 0849   risperiDONE (RISPERDAL) tablet 0.5 mg  0.5 mg Oral [id number] Shazil, Hilaire, DO   0.5 mg at 06/19/23 1256   traZODone (DESYREL) tablet 100 mg  100 mg Oral QHS PRN Sarina Ill, DO        Lab Results: No results found for this or any previous visit (from the past 48 hour(s)).  Blood Alcohol level:  Lab Results  Component Value Date   ETH <10 06/13/2023   ETH <10 05/27/2023    Metabolic Disorder Labs: Lab Results  Component Value Date   HGBA1C 5.8 (H) 06/01/2023   MPG 119.76 06/01/2023   MPG 120 02/20/2017   No results found for: "PROLACTIN" Lab Results  Component Value Date   CHOL 124 06/01/2023   TRIG 81 06/01/2023   HDL 40 (L) 06/01/2023   CHOLHDL 3.1 06/01/2023   VLDL 16 06/01/2023   LDLCALC 68 06/01/2023   LDLCALC  04/04/2022     Comment:     . LDL cholesterol not calculated. Triglyceride levels greater than 400 mg/dL invalidate calculated LDL results. . Reference range: <100 . Desirable range <100 mg/dL for primary prevention;   <70 mg/dL for patients with CHD or diabetic patients  with > or = 2 CHD risk factors. Marland Kitchen LDL-C is now calculated using the Martin-Hopkins  calculation, which is a validated novel method providing  better accuracy than the Friedewald equation in the  estimation of LDL-C.  Horald Pollen et al. Lenox Ahr. 0981;191(47): 2061-2068  (http://education.[employer].com/faq/FAQ164)     Physical Findings: AIMS:  , ,  ,  ,     CIWA:    COWS:     Musculoskeletal: Strength & Muscle Tone: within normal limits Gait & Station: normal Patient leans: N/A  Psychiatric Specialty Exam:  Presentation  General Appearance:  Disheveled  Eye Contact: Fair  Speech: Pressured  Speech Volume: Normal  Handedness: Right   Mood and Affect  Mood: Anxious; Irritable  Affect: Restricted   Thought Process  Thought Processes: Disorganized  Descriptions of Associations:Loose  Orientation:Partial  Thought Content:Delusions; Paranoid Ideation; Scattered  History of Schizophrenia/Schizoaffective disorder:No  Duration of Psychotic Symptoms:Less than six months  Hallucinations:No data recorded Ideas of Reference:Paranoia; Delusions  Suicidal Thoughts:No data recorded Homicidal Thoughts:No data recorded  Sensorium  Memory: Immediate Fair; Remote Fair  Judgment: Poor  Insight: Poor   Executive Functions  Concentration: Poor  Attention Span: Fair  Recall: Poor  Fund of Knowledge: Fair  Language: Fair   Psychomotor Activity  Psychomotor Activity:No data recorded  Assets  Assets: Financial Resources/Insurance; Social Support; Housing   Sleep  Sleep:No data recorded    Blood pressure 116/77, pulse 98, temperature 98.2 F (36.8 C), resp. rate 20, height 6\' 1"  (1.854 m), weight 104.6 kg, SpO2 97%. Body mass index is 30.42 kg/m.   Treatment Plan Summary: Daily contact with patient to assess and evaluate symptoms and progress in  treatment, Medication management, and Plan restart psychiatric medications.  Sarina Ill, DO 06/19/2023, 1:52 PM

## 2023-06-19 NOTE — Plan of Care (Signed)
  Problem: Nutrition: Goal: Adequate nutrition will be maintained Outcome: Progressing   Problem: Coping: Goal: Level of anxiety will decrease Outcome: Progressing   Problem: Elimination: Goal: Will not experience complications related to bowel motility Outcome: Progressing   

## 2023-06-19 NOTE — Progress Notes (Signed)
Patient transferred from [facility] to [facility] as involuntary commitment for substance abuse induced psychosis in which he exhibited paranoia and was threatening neighbors with a gun. Currently denies S/I, H/I, AVH. Compliant with medications and has limited interactions with peers and staff. Can use wheelchair independently but also on occasion requests help from staff and peers. Has access to walker for short distance ambulation in his room. Will continue to monitor.

## 2023-06-19 NOTE — Group Note (Signed)
Date:  06/19/2023 Time:  10:07 AM  Group Topic/Focus:  Goals Group:   The focus of this group is to help patients establish daily goals to achieve during treatment and discuss how the patient can incorporate goal setting into their daily lives to aide in recovery.    Participation Level:  Active  Participation Quality:  Appropriate and Monopolizing  Affect:  Appropriate  Cognitive:  Appropriate  Insight: Appropriate  Engagement in Group:  Engaged and Monopolizing  Modes of Intervention:  Discussion, Education, and Support  Additional Comments:    Wilford Corner 06/19/2023, 10:07 AM

## 2023-06-19 NOTE — Progress Notes (Signed)
   06/19/23 0850  Psych Admission Type (Psych Patients Only)  Admission Status Involuntary  Psychosocial Assessment  Patient Complaints None  Eye Contact Fair  Facial Expression Anxious  Affect Preoccupied  Speech Logical/coherent  Interaction Assertive  Motor Activity Slow  Appearance/Hygiene Unremarkable  Behavior Characteristics Cooperative;Appropriate to situation  Mood Anxious;Pleasant  Thought Process  Coherency Circumstantial  Content Preoccupation  Delusions Paranoid  Perception Derealization  Hallucination None reported or observed  Judgment Impaired  Confusion None  Danger to Self  Current suicidal ideation? Denies  Agreement Not to Harm Self Yes  Description of Agreement Verbal  Danger to Others  Danger to Others None reported or observed   Patient is alert and oriented X4.  Patient denies SI/HI/AVH.  Patient denies pain.  Patient has been out of his room today. Patient has participated in groups and activities. Patient is pleasant and cooperative.  Patient remains on Q 15 minute checks.  1320 Patient transferred to [facility] via w/c.

## 2023-06-19 NOTE — Group Note (Signed)
Recreation Therapy Group Note   Group Topic:Goal Setting  Group Date: 06/19/2023 Start Time: 1000 End Time: 1120 Facilitators: Rosina Lowenstein, LRT, CTRS Location:  Craft Room  Group Description: Vision Board. Patients were given many different magazines, a glue stick, markers, and a piece of cardstock paper. LRT and pts discussed the importance of having goals in life. LRT and pts discussed the difference between short-term and long-term goals, as well as what a SMART goal is. LRT encouraged pts to create a vision board, with images they picked and then cut out with safety scissors from the magazine, for themselves, that capture their short and long-term goals. LRT encouraged pts to show and explain their vision board to the group.   Goal Area(s) Addressed:  Patient will gain knowledge of short vs. long term goals.  Patient will identify goals for themselves. Patient will practice setting SMART goals. Patient will verbalize their goals to LRT and peers.   Affect/Mood: Blunted   Participation Level: Active   Participation Quality: Minimal Cues   Behavior: Cooperative   Speech/Thought Process: Loose association   Insight: Limited   Judgement: Limited   Modes of Intervention: Art   Patient Response to Interventions:  Receptive   Education Outcome:  In group clarification offered    Clinical Observations/Individualized Feedback: Devin [name] was active in their participation of session activities and group discussion. Pt was in and out of group 3 times. Pt came late and stayed late to finish his vision board. Pt goals pertained to his ex wife who cheated on him and didn't make much sense. As pt was leaving the craft room he said: "I really enjoyed that, I didn't think I was going to, but I did. Thank you." Pt needed cues not to speak over others while they were presenting their vision boards to the group. Pt was receptive to redirection.    Plan: Continue to engage patient in RT group  sessions 2-3x/week.   Rosina Lowenstein, LRT, CTRS 06/19/2023 12:29 PM

## 2023-06-19 NOTE — Progress Notes (Signed)
Patient arrived to [facility] and oriented to the unit.  C/o the room being cold so maintenance request placed.

## 2023-06-20 DIAGNOSIS — F19959 Other psychoactive substance use, unspecified with psychoactive substance-induced psychotic disorder, unspecified: Secondary | ICD-10-CM | POA: Diagnosis not present

## 2023-06-20 MED ORDER — TRAZODONE HCL 100 MG PO TABS
100.0000 mg | ORAL_TABLET | Freq: Every day | ORAL | Status: DC
  Administered 2023-06-20 – 2023-06-25 (×6): 100 mg via ORAL
  Filled 2023-06-20 (×6): qty 1

## 2023-06-20 MED ORDER — OLANZAPINE 5 MG PO TABS
10.0000 mg | ORAL_TABLET | Freq: Every day | ORAL | Status: DC
  Administered 2023-06-20 – 2023-06-25 (×6): 10 mg via ORAL
  Filled 2023-06-20 (×6): qty 2

## 2023-06-20 MED ORDER — DULOXETINE HCL 60 MG PO CPEP
60.0000 mg | ORAL_CAPSULE | Freq: Every day | ORAL | Status: DC
  Administered 2023-06-21 – 2023-06-26 (×6): 60 mg via ORAL
  Filled 2023-06-20 (×6): qty 1

## 2023-06-20 MED ORDER — ALBUTEROL SULFATE HFA 108 (90 BASE) MCG/ACT IN AERS
2.0000 | INHALATION_SPRAY | RESPIRATORY_TRACT | Status: DC | PRN
  Administered 2023-06-20 – 2023-06-26 (×12): 2 via RESPIRATORY_TRACT
  Filled 2023-06-20: qty 6.7

## 2023-06-20 NOTE — Group Note (Signed)
Date:  06/20/2023 Time:  12:01 PM  Group Topic/Focus:  Activity Group:  The purpose of this group is to encourage patients to go outside in the courtyard for some fresh air and to get some exercise.    Participation Level:  Did Not Attend   Devin [name] 06/20/2023, 12:01 PM

## 2023-06-20 NOTE — Plan of Care (Signed)
  Problem: Education: Goal: Knowledge of General Education information will improve Description: Including pain rating scale, medication(s)/side effects and non-pharmacologic comfort measures Outcome: Progressing   Problem: Health Behavior/Discharge Planning: Goal: Ability to manage health-related needs will improve Outcome: Progressing   Problem: Nutrition: Goal: Adequate nutrition will be maintained Outcome: Progressing   
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
## 2023-06-20 NOTE — Progress Notes (Signed)
[facility] MD Progress Note  06/20/2023 2:01 PM Devin [name]  MRN:  [id number] Subjective: Devin [name] is seen on rounds.  He continues to be depressed and isolative.  He states he did not sleep at all last night.  He complains of pain all over.  He has been compliant with medications so far.  He denies any side effects from his medications.  He continues to need a lot of assistance.  OT recommends help with medications. Principal Problem: Psychoactive substance-induced psychosis (HCC) Diagnosis: Principal Problem:   Psychoactive substance-induced psychosis (HCC)  Total Time spent with patient: 15 minutes  Past Psychiatric History: Delusions  Past Medical History:  Past Medical History:  Diagnosis Date   Anxiety    Arthritis    "I'm eat up w/it" (02/20/2017)   Asthma    Chronic back pain    "the whole back" (02/20/2017)   Chronic bronchitis (HCC)    Complication of anesthesia    "felt like I couldn't breath coming out of it"   COPD (chronic obstructive pulmonary disease) (HCC)    Depression    GERD (gastroesophageal reflux disease)    History of hiatal hernia    History of stomach ulcers    "bleeding ones; I was young then"   HIV infection (HCC) dx'd ~ 1999   Hypertension    Pneumonia    "several times" (02/20/2017)   Prolapsed disk 10/28   Pulmonary embolism (HCC) 02/20/2017   Scoliosis 08/24/13    Past Surgical History:  Procedure Laterality Date   BACK SURGERY  2019   KNEE ARTHROSCOPY Right 1980s   Family History:  Family History  Problem Relation Age of Onset   Diabetes Father    Stroke Other    Colon cancer Neg Hx    Family Psychiatric  History: Unremarkable Social History:  Social History   Substance and Sexual Activity  Alcohol Use Not Currently   Comment: last used about 9 years ago     Social History   Substance and Sexual Activity  Drug Use Yes   Types: Marijuana   Comment: denies    Social History   Socioeconomic History   Marital status: Divorced     Spouse name: Not on file   Number of children: Not on file   Years of education: Not on file   Highest education level: Not on file  Occupational History   Not on file  Tobacco Use   Smoking status: Every Day    Current packs/day: 1.00    Average packs/day: 1 pack/day for 48.0 years (48.0 ttl pk-yrs)    Types: Cigarettes   Smokeless tobacco: Never  Vaping Use   Vaping status: Never Used  Substance and Sexual Activity   Alcohol use: Not Currently    Comment: last used about 9 years ago   Drug use: Yes    Types: Marijuana    Comment: denies   Sexual activity: Yes    Partners: Female    Comment: declined condoms  Other Topics Concern   Not on file  Social History Narrative   Not on file   Social Determinants of Health   Financial Resource Strain: Not on file  Food Insecurity: Food Insecurity Present (06/17/2023)   Hunger Vital Sign    Worried About Running Out of Food in the Last Year: Sometimes true    Ran Out of Food in the Last Year: Never true  Transportation Needs: No Transportation Needs (06/17/2023)   PRAPARE - Transportation  Lack of Transportation (Medical): No    Lack of Transportation (Non-Medical): No  Recent Concern: Transportation Needs - Unmet Transportation Needs (05/28/2023)   PRAPARE - Administrator, Civil Service (Medical): Yes    Lack of Transportation (Non-Medical): Yes  Physical Activity: Not on file  Stress: Not on file  Social Connections: Not on file   Additional Social History:                         Sleep: Poor  Appetite:  Fair  Current Medications: Current Facility-Administered Medications  Medication Dose Route Frequency Provider Last Rate Last Admin   acetaminophen (TYLENOL) tablet 650 mg  650 mg Oral Q6H PRN Bennett, Christal H, NP       alum & mag hydroxide-simeth (MAALOX/MYLANTA) 200-200-20 MG/5ML suspension 30 mL  30 mL Oral Q4H PRN Bennett, Christal H, NP       amLODipine (NORVASC) tablet 5 mg  5 mg Oral  Daily Bennett, Christal H, NP   5 mg at 06/20/23 0959   apixaban (ELIQUIS) tablet 2.5 mg  2.5 mg Oral BID Bennett, Christal H, NP   2.5 mg at 06/20/23 8295   diphenhydrAMINE (BENADRYL) capsule 50 mg  50 mg Oral TID PRN Willeen Cass, Christal H, NP       Or   diphenhydrAMINE (BENADRYL) injection 50 mg  50 mg Intramuscular TID PRN Willeen Cass, Christal H, NP       [START ON 06/21/2023] DULoxetine (CYMBALTA) DR capsule 60 mg  60 mg Oral Daily Sarina Ill, DO       elvitegravir-cobicistat-emtricitabine-tenofovir (GENVOYA) 150-150-200-10 MG tablet 1 tablet  1 tablet Oral Q breakfast Bennett, Christal H, NP   1 tablet at 06/20/23 0959   feeding supplement (ENSURE ENLIVE / ENSURE PLUS) liquid 237 mL  237 mL Oral BID BM Sarina Ill, DO       furosemide (LASIX) tablet 40 mg  40 mg Oral Daily Charm Rings, NP   40 mg at 06/20/23 1000   gabapentin (NEURONTIN) capsule 200 mg  200 mg Oral TID Charm Rings, NP   200 mg at 06/20/23 1205   haloperidol (HALDOL) tablet 5 mg  5 mg Oral TID PRN Bennett, Christal H, NP       Or   haloperidol lactate (HALDOL) injection 5 mg  5 mg Intramuscular TID PRN Bennett, Christal H, NP       hydrOXYzine (ATARAX) tablet 25 mg  25 mg Oral TID PRN Bennett, Christal H, NP       lidocaine (LIDODERM) 5 % 1 patch  1 patch Transdermal Q24H Bennett, Christal H, NP   1 patch at 06/19/23 2000   LORazepam (ATIVAN) tablet 2 mg  2 mg Oral TID PRN Thurston Hole H, NP   2 mg at 06/18/23 2112   Or   LORazepam (ATIVAN) injection 2 mg  2 mg Intramuscular TID PRN Bennett, Christal H, NP       magnesium hydroxide (MILK OF MAGNESIA) suspension 15 mL  15 mL Oral Daily PRN Bennett, Christal H, NP       methocarbamol (ROBAXIN) tablet 500 mg  500 mg Oral TID Charm Rings, NP   500 mg at 06/20/23 1205   OLANZapine (ZYPREXA) tablet 10 mg  10 mg Oral QHS Markale Birdsell Edward, DO       pantoprazole (PROTONIX) EC tablet 40 mg  40 mg Oral Daily Charm Rings, NP   40 mg  at  06/20/23 1000   potassium chloride SA (KLOR-CON M) CR tablet 20 mEq  20 mEq Oral QPC breakfast Charm Rings, NP   20 mEq at 06/20/23 1000   risperiDONE (RISPERDAL) tablet 0.5 mg  0.5 mg Oral [id number] Ibrahim, Knipe, DO   0.5 mg at 06/20/23 9811   traZODone (DESYREL) tablet 100 mg  100 mg Oral QHS Sarina Ill, DO        Lab Results: No results found for this or any previous visit (from the past 48 hour(s)).  Blood Alcohol level:  Lab Results  Component Value Date   ETH <10 06/13/2023   ETH <10 05/27/2023    Metabolic Disorder Labs: Lab Results  Component Value Date   HGBA1C 5.8 (H) 06/01/2023   MPG 119.76 06/01/2023   MPG 120 02/20/2017   No results found for: "PROLACTIN" Lab Results  Component Value Date   CHOL 124 06/01/2023   TRIG 81 06/01/2023   HDL 40 (L) 06/01/2023   CHOLHDL 3.1 06/01/2023   VLDL 16 06/01/2023   LDLCALC 68 06/01/2023   LDLCALC  04/04/2022     Comment:     . LDL cholesterol not calculated. Triglyceride levels greater than 400 mg/dL invalidate calculated LDL results. . Reference range: <100 . Desirable range <100 mg/dL for primary prevention;   <70 mg/dL for patients with CHD or diabetic patients  with > or = 2 CHD risk factors. Marland Kitchen LDL-C is now calculated using the Martin-Hopkins  calculation, which is a validated novel method providing  better accuracy than the Friedewald equation in the  estimation of LDL-C.  Horald Pollen et al. Lenox Ahr. 9147;829(56): 2061-2068  (http://education.[employer].com/faq/FAQ164)     Physical Findings: AIMS:  , ,  ,  ,    CIWA:    COWS:     Musculoskeletal: Strength & Muscle Tone: within normal limits Gait & Station: unable to stand Patient leans: N/A  Psychiatric Specialty Exam:  Presentation  General Appearance:  Disheveled  Eye Contact: Fair  Speech: Pressured  Speech Volume: Normal  Handedness: Right   Mood and Affect  Mood: Anxious;  Irritable  Affect: Restricted   Thought Process  Thought Processes: Disorganized  Descriptions of Associations:Loose  Orientation:Partial  Thought Content:Delusions; Paranoid Ideation; Scattered  History of Schizophrenia/Schizoaffective disorder:No  Duration of Psychotic Symptoms:Less than six months  Hallucinations:No data recorded Ideas of Reference:Paranoia; Delusions  Suicidal Thoughts:No data recorded Homicidal Thoughts:No data recorded  Sensorium  Memory: Immediate Fair; Remote Fair  Judgment: Poor  Insight: Poor   Executive Functions  Concentration: Poor  Attention Span: Fair  Recall: Poor  Fund of Knowledge: Fair  Language: Fair   Psychomotor Activity  Psychomotor Activity:No data recorded  Assets  Assets: Financial Resources/Insurance; Social Support; Housing   Sleep  Sleep:No data recorded   Blood pressure 127/76, pulse 83, temperature 98.2 F (36.8 C), resp. rate 18, height 6\' 1"  (1.854 m), weight 104.6 kg, SpO2 98%. Body mass index is 30.42 kg/m.   Treatment Plan Summary: Daily contact with patient to assess and evaluate symptoms and progress in treatment, Medication management, and Plan increase Zyprexa to 10 mg at bedtime.  Change trazodone to scheduled.  Increase Cymbalta to 60 mg/day.  Sarina Ill, DO 06/20/2023, 2:01 PM

## 2023-06-20 NOTE — Plan of Care (Signed)
Pt remain in bed throughout the shift. C/o back pain. Pain mgmt maintained. Po med compliant. Denies SI/HI/AV/H. Q 15 min checks provided.   Problem: Activity: Goal: Risk for activity intolerance will decrease Outcome: Progressing   Problem: Nutrition: Goal: Adequate nutrition will be maintained Outcome: Progressing   Problem: Elimination: Goal: Will not experience complications related to bowel motility Outcome: Progressing Goal: Will not experience complications related to urinary retention Outcome: Progressing   Problem: Pain Managment: Goal: General experience of comfort will improve Outcome: Progressing

## 2023-06-20 NOTE — Group Note (Signed)
Recreation Therapy Group Note   Group Topic:Leisure Education  Group Date: 06/20/2023 Start Time: 1400 End Time: 1450 Facilitators: Rosina Lowenstein, LRT, CTRS Location:  Day Room  Group Description: Leisure. Patients were given the option to choose from playing bingo, singing karaoke, listening to music or going outside to the courtyard. Pts collectively chose to play bingo as a group. LRT and pts discussed the meaning of leisure, the importance of participating in leisure during their free time/when they're outside of the hospital, as well as how our leisure interests can also serve as coping skills.   Goal Area(s) Addressed:  Patient will learn the definition of "leisure". Patient will practice making a positive decision. Patient will have the opportunity to try a new leisure activity. Patient will communicate with peers and LRT.    Affect/Mood: N/A   Participation Level: Did not attend    Clinical Observations/Individualized Feedback: Devin [name] did not attend due to working with PT.  Plan: Continue to engage patient in RT group sessions 2-3x/week.   Rosina Lowenstein, LRT, CTRS 06/20/2023 3:07 PM
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
## 2023-06-20 NOTE — Progress Notes (Signed)
Patient initially admitted IVC to behavioral medicine unit on June 18, 2023 and transferred to [facility] unit on August 22. Patient exhibited paranoia and was making bizarre statements on the telephone to his children. Patient was recently admitted to the [facility] floor for similar presentation.  He denies SI/HI/AVH. He denies depression and anxiety. He endorses chronic back pain. Scheduled gabapentin and robaxin aids in pain management. Albuterol inhaler adm at 1440. Upon follow up, patient found relief.  Q15 minute unit checks in place.

## 2023-06-21 ENCOUNTER — Inpatient Hospital Stay: Payer: 59

## 2023-06-21 DIAGNOSIS — F19959 Other psychoactive substance use, unspecified with psychoactive substance-induced psychotic disorder, unspecified: Secondary | ICD-10-CM | POA: Diagnosis not present

## 2023-06-21 NOTE — Group Note (Signed)
Date:  06/21/2023 Time:  8:48 PM  Group Topic/Focus:  Goals Group:   The focus of this group is to help patients establish daily goals to achieve during treatment and discuss how the patient can incorporate goal setting into their daily lives to aide in recovery.    Participation Level:  Active  Participation Quality:  Appropriate  Affect:    Cognitive:  Appropriate  Insight: Appropriate  Engagement in Group:  Engaged  Modes of Intervention:  Discussion  Additional Comments:    [name] 06/21/2023, 8:48 PM

## 2023-06-21 NOTE — Plan of Care (Signed)
Pt is needy and intrusive. C/o back pain. Endorses anxiety. States he's ready to go home. Amb via w/c. Self propels. Po med compliant. Tylenol 650 mg po and Vistaril 25 mg po given PRN as ordered, respectively. Slept well throughout the night.   Problem: Activity: Goal: Risk for activity intolerance will decrease Outcome: Progressing   Problem: Nutrition: Goal: Adequate nutrition will be maintained Outcome: Progressing   Problem: Coping: Goal: Level of anxiety will decrease Outcome: Progressing   Problem: Elimination: Goal: Will not experience complications related to bowel motility Outcome: Progressing Goal: Will not experience complications related to urinary retention Outcome: Progressing   Problem: Pain Managment: Goal: General experience of comfort will improve Outcome: Progressing   Problem: Safety: Goal: Ability to remain free from injury will improve Outcome: Progressing   Problem: Skin Integrity: Goal: Risk for impaired skin integrity will decrease Outcome: Progressing
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
## 2023-06-21 NOTE — Progress Notes (Signed)
Pt is cooperative. Pt has stayed in his bed much of shift. Pt is able to contracr for safety. Pt went for CT scan of thoracic spine and is awaiting results.

## 2023-06-21 NOTE — Group Note (Signed)
Date:  06/21/2023 Time:  11:04 AM  Group Topic/Focus:  Community Group/Outside Rec Explaining the rules and expectations of the unit and went outside and got fresh air and listened the music.   Participation Level:  Did Not Attend  Participation Quality:    Affect:    Cognitive:    Insight:   Engagement in Group:    Modes of Intervention:    Additional Comments:  Did not attend  Drae Mitzel T Megyn Leng 06/21/2023, 11:04 AM

## 2023-06-21 NOTE — Plan of Care (Signed)
D- Patient alert and oriented. Affect is  restricted mood cooperative Denies SI, HI, AVH, and pain. Pt  finds difficulty moving ,activity tolerance not increasing. A- Scheduled medications administered to patient, per MD orders. Support and encouragement provided.  Routine safety checks conducted every 15 minutes.  Patient informed to notify staff with problems or concerns. R- No adverse drug reactions noted. Patient contracts for safety at this time. Patient compliant with medications and treatment plan. Patient receptive, calm, and cooperative. Patient interacts well with others on the unit.  Patient remains safe at this time.

## 2023-06-22 ENCOUNTER — Inpatient Hospital Stay: Payer: 59

## 2023-06-22 DIAGNOSIS — F19959 Other psychoactive substance use, unspecified with psychoactive substance-induced psychotic disorder, unspecified: Secondary | ICD-10-CM | POA: Diagnosis not present

## 2023-06-22 LAB — SARS CORONAVIRUS 2 BY RT PCR: SARS Coronavirus 2 by RT PCR: POSITIVE — AB

## 2023-06-22 NOTE — Plan of Care (Signed)

## 2023-06-22 NOTE — Group Note (Signed)
Date:  06/22/2023 Time:  9:31 AM  Group Topic/Focus:  Goals Group:   The focus of this group is to help patients establish daily goals to achieve during treatment and discuss how the patient can incorporate goal setting into their daily lives to aide in recovery.   Participation Level:  Did Not Attend   Torie Priebe A Lymon Kidney 06/22/2023, 9:31 AM

## 2023-06-22 NOTE — Plan of Care (Signed)
Pt c/o generalized malaise, requested medication for cold symptoms and pain. Provider was notified and order COVID testing and CXR, patient detected as SARs + and was placed on contact/droplets precautions. Pt spent the day in bed, with comfort measures provided, he was compliant with treatment and denied active SI/HI, plan or intent. Will continue to monitor for safety and provide support according to plan of care. Problem: Education: Goal: Knowledge of General Education information will improve Description: Including pain rating scale, medication(s)/side effects and non-pharmacologic comfort measures Outcome: Not Progressing   Problem: Health Behavior/Discharge Planning: Goal: Ability to manage health-related needs will improve Outcome: Not Progressing   Problem: Clinical Measurements: Goal: Ability to maintain clinical measurements within normal limits will improve Outcome: Not Progressing Goal: Will remain free from infection Outcome: Not Progressing Goal: Diagnostic test results will improve Outcome: Not Progressing Goal: Respiratory complications will improve Outcome: Not Progressing Goal: Cardiovascular complication will be avoided Outcome: Progressing   Problem: Activity: Goal: Risk for activity intolerance will decrease Outcome: Progressing

## 2023-06-22 NOTE — Group Note (Signed)
Date:  06/22/2023 Time:  10:53 PM  Group Topic/Focus:  Making Healthy Choices:   The focus of this group is to help patients identify negative/unhealthy choices they were using prior to admission and identify positive/healthier coping strategies to replace them upon discharge.    Participation Level:  Did Not Attend  Participation Quality:      Affect:      Cognitive:      Insight: None  Engagement in Group:  None  Modes of Intervention:      Additional Comments:    Maeola Harman 06/22/2023, 10:53 PM

## 2023-06-22 NOTE — Group Note (Signed)
[facility] LCSW Group Therapy Note   Group Date: 06/22/2023 Start Time: 1300 End Time: 1350   Type of Therapy/Topic:  Group Therapy:  Emotion Regulation  Participation Level:  Did Not Attend   Mood:  Description of Group:    The purpose of this group is to assist patients in learning to regulate negative emotions and experience positive emotions. Patients will be guided to discuss ways in which they have been vulnerable to their negative emotions. These vulnerabilities will be juxtaposed with experiences of positive emotions or situations, and patients challenged to use positive emotions to combat negative ones. Special emphasis will be placed on coping with negative emotions in conflict situations, and patients will process healthy conflict resolution skills.  Therapeutic Goals: Patient will identify two positive emotions or experiences to reflect on in order to balance out negative emotions:  Patient will label two or more emotions that they find the most difficult to experience:  Patient will be able to demonstrate positive conflict resolution skills through discussion or role plays:   Summary of Patient Progress:   Patient did not attend group   Azucena Kuba, LCSWA

## 2023-06-22 NOTE — Progress Notes (Signed)
[facility] MD Progress Note  06/22/2023 1:01 PM Devin [name]  MRN:  [id number] Subjective: Devin [name] is seen on rounds.  He generally does not feel well.  He does have a history of COPD.  He probably benefit from a chest x-ray.  Nurses suggest getting a COVID swab.  His CT of his thoracic spine did show some changes in T1 and T11.  He denies any suicidal ideation.  Vital signs were stable for the most part. Principal Problem: Psychoactive substance-induced psychosis (HCC) Diagnosis: Principal Problem:   Psychoactive substance-induced psychosis (HCC)  Total Time spent with patient: 15 minutes  Past Psychiatric History: Delusions  Past Medical History:  Past Medical History:  Diagnosis Date   Anxiety    Arthritis    "I'm eat up w/it" (02/20/2017)   Asthma    Chronic back pain    "the whole back" (02/20/2017)   Chronic bronchitis (HCC)    Complication of anesthesia    "felt like I couldn't breath coming out of it"   COPD (chronic obstructive pulmonary disease) (HCC)    Depression    GERD (gastroesophageal reflux disease)    History of hiatal hernia    History of stomach ulcers    "bleeding ones; I was young then"   HIV infection (HCC) dx'd ~ 1999   Hypertension    Pneumonia    "several times" (02/20/2017)   Prolapsed disk 10/28   Pulmonary embolism (HCC) 02/20/2017   Scoliosis 08/24/13    Past Surgical History:  Procedure Laterality Date   BACK SURGERY  2019   KNEE ARTHROSCOPY Right 1980s   Family History:  Family History  Problem Relation Age of Onset   Diabetes Father    Stroke Other    Colon cancer Neg Hx    Family Psychiatric  History: Unremarkable Social History:  Social History   Substance and Sexual Activity  Alcohol Use Not Currently   Comment: last used about 9 years ago     Social History   Substance and Sexual Activity  Drug Use Yes   Types: Marijuana   Comment: denies    Social History   Socioeconomic History   Marital status: Divorced    Spouse name:  Not on file   Number of children: Not on file   Years of education: Not on file   Highest education level: Not on file  Occupational History   Not on file  Tobacco Use   Smoking status: Every Day    Current packs/day: 1.00    Average packs/day: 1 pack/day for 48.0 years (48.0 ttl pk-yrs)    Types: Cigarettes   Smokeless tobacco: Never  Vaping Use   Vaping status: Never Used  Substance and Sexual Activity   Alcohol use: Not Currently    Comment: last used about 9 years ago   Drug use: Yes    Types: Marijuana    Comment: denies   Sexual activity: Yes    Partners: Female    Comment: declined condoms  Other Topics Concern   Not on file  Social History Narrative   Not on file   Social Determinants of Health   Financial Resource Strain: Not on file  Food Insecurity: Food Insecurity Present (06/17/2023)   Hunger Vital Sign    Worried About Running Out of Food in the Last Year: Sometimes true    Ran Out of Food in the Last Year: Never true  Transportation Needs: No Transportation Needs (06/17/2023)   PRAPARE - Transportation  Lack of Transportation (Medical): No    Lack of Transportation (Non-Medical): No  Recent Concern: Transportation Needs - Unmet Transportation Needs (05/28/2023)   PRAPARE - Administrator, Civil Service (Medical): Yes    Lack of Transportation (Non-Medical): Yes  Physical Activity: Not on file  Stress: Not on file  Social Connections: Not on file   Additional Social History:                         Sleep: Fair  Appetite:  Fair  Current Medications: Current Facility-Administered Medications  Medication Dose Route Frequency Provider Last Rate Last Admin   acetaminophen (TYLENOL) tablet 650 mg  650 mg Oral Q6H PRN Bennett, Christal H, NP   650 mg at 06/21/23 2138   albuterol (VENTOLIN HFA) 108 (90 Base) MCG/ACT inhaler 2 puff  2 puff Inhalation Q4H PRN Sarina Ill, DO   2 puff at 06/22/23 0857   alum & mag  hydroxide-simeth (MAALOX/MYLANTA) 200-200-20 MG/5ML suspension 30 mL  30 mL Oral Q4H PRN Bennett, Christal H, NP       amLODipine (NORVASC) tablet 5 mg  5 mg Oral Daily Bennett, Christal H, NP   5 mg at 06/22/23 0850   apixaban (ELIQUIS) tablet 2.5 mg  2.5 mg Oral BID Bennett, Christal H, NP   2.5 mg at 06/22/23 0849   diphenhydrAMINE (BENADRYL) capsule 50 mg  50 mg Oral TID PRN Bennett, Christal H, NP       Or   diphenhydrAMINE (BENADRYL) injection 50 mg  50 mg Intramuscular TID PRN Bennett, Christal H, NP       DULoxetine (CYMBALTA) DR capsule 60 mg  60 mg Oral Daily Jagur, Aksamit, DO   60 mg at 06/22/23 0850   elvitegravir-cobicistat-emtricitabine-tenofovir (GENVOYA) 150-150-200-10 MG tablet 1 tablet  1 tablet Oral Q breakfast Willeen Cass, Christal H, NP   1 tablet at 06/22/23 0851   feeding supplement (ENSURE ENLIVE / ENSURE PLUS) liquid 237 mL  237 mL Oral BID BM Sarina Ill, DO   237 mL at 06/22/23 1224   furosemide (LASIX) tablet 40 mg  40 mg Oral Daily Charm Rings, NP   40 mg at 06/22/23 0847   gabapentin (NEURONTIN) capsule 200 mg  200 mg Oral TID Charm Rings, NP   200 mg at 06/22/23 1227   haloperidol (HALDOL) tablet 5 mg  5 mg Oral TID PRN Bennett, Christal H, NP       Or   haloperidol lactate (HALDOL) injection 5 mg  5 mg Intramuscular TID PRN Bennett, Christal H, NP       hydrOXYzine (ATARAX) tablet 25 mg  25 mg Oral TID PRN Bennett, Christal H, NP   25 mg at 06/21/23 2138   lidocaine (LIDODERM) 5 % 1 patch  1 patch Transdermal Q24H Bennett, Christal H, NP   1 patch at 06/21/23 2000   LORazepam (ATIVAN) tablet 2 mg  2 mg Oral TID PRN Thurston Hole H, NP   2 mg at 06/18/23 2112   Or   LORazepam (ATIVAN) injection 2 mg  2 mg Intramuscular TID PRN Bennett, Christal H, NP       magnesium hydroxide (MILK OF MAGNESIA) suspension 15 mL  15 mL Oral Daily PRN Bennett, Christal H, NP       methocarbamol (ROBAXIN) tablet 500 mg  500 mg Oral TID Charm Rings, NP    500 mg at 06/22/23 1227  OLANZapine (ZYPREXA) tablet 10 mg  10 mg Oral QHS Sarina Ill, DO   10 mg at 06/21/23 2138   pantoprazole (PROTONIX) EC tablet 40 mg  40 mg Oral Daily Charm Rings, NP   40 mg at 06/22/23 0848   potassium chloride SA (KLOR-CON M) CR tablet 20 mEq  20 mEq Oral QPC breakfast Charm Rings, NP   20 mEq at 06/22/23 0847   risperiDONE (RISPERDAL) tablet 0.5 mg  0.5 mg Oral [id number] Arcangelo, Allums, DO   0.5 mg at 06/22/23 0848   traZODone (DESYREL) tablet 100 mg  100 mg Oral QHS Sarina Ill, DO   100 mg at 06/21/23 2138    Lab Results: No results found for this or any previous visit (from the past 48 hour(s)).  Blood Alcohol level:  Lab Results  Component Value Date   ETH <10 06/13/2023   ETH <10 05/27/2023    Metabolic Disorder Labs: Lab Results  Component Value Date   HGBA1C 5.8 (H) 06/01/2023   MPG 119.76 06/01/2023   MPG 120 02/20/2017   No results found for: "PROLACTIN" Lab Results  Component Value Date   CHOL 124 06/01/2023   TRIG 81 06/01/2023   HDL 40 (L) 06/01/2023   CHOLHDL 3.1 06/01/2023   VLDL 16 06/01/2023   LDLCALC 68 06/01/2023   LDLCALC  04/04/2022     Comment:     . LDL cholesterol not calculated. Triglyceride levels greater than 400 mg/dL invalidate calculated LDL results. . Reference range: <100 . Desirable range <100 mg/dL for primary prevention;   <70 mg/dL for patients with CHD or diabetic patients  with > or = 2 CHD risk factors. Marland Kitchen LDL-C is now calculated using the Martin-Hopkins  calculation, which is a validated novel method providing  better accuracy than the Friedewald equation in the  estimation of LDL-C.  Horald Pollen et al. Lenox Ahr. 6578;469(62): 2061-2068  (http://education.[facility].com/faq/FAQ164)     Physical Findings: AIMS:  , ,  ,  ,    CIWA:    COWS:     Musculoskeletal: Strength & Muscle Tone: within normal limits Gait & Station: normal Patient leans:  N/A  Psychiatric Specialty Exam:  Presentation  General Appearance:  Disheveled  Eye Contact: Fair  Speech: Pressured  Speech Volume: Normal  Handedness: Right   Mood and Affect  Mood: Anxious; Irritable  Affect: Restricted   Thought Process  Thought Processes: Disorganized  Descriptions of Associations:Loose  Orientation:Partial  Thought Content:Delusions; Paranoid Ideation; Scattered  History of Schizophrenia/Schizoaffective disorder:No  Duration of Psychotic Symptoms:Less than six months  Hallucinations:No data recorded Ideas of Reference:Paranoia; Delusions  Suicidal Thoughts:No data recorded Homicidal Thoughts:No data recorded  Sensorium  Memory: Immediate Fair; Remote Fair  Judgment: Poor  Insight: Poor   Executive Functions  Concentration: Poor  Attention Span: Fair  Recall: Poor  Fund of Knowledge: Fair  Language: Fair   Psychomotor Activity  Psychomotor Activity:No data recorded  Assets  Assets: Financial Resources/Insurance; Social Support; Housing   Sleep  Sleep:No data recorded    Blood pressure 120/75, pulse (!) 105, temperature 98.4 F (36.9 C), temperature source Oral, resp. rate 18, height 6\' 1"  (1.854 m), weight 104.6 kg, SpO2 97%. Body mass index is 30.42 kg/m.   Treatment Plan Summary: Daily contact with patient to assess and evaluate symptoms and progress in treatment, Medication management, and Plan CBC, chest x-ray and COVID swab  Sarina Ill, DO 06/22/2023, 1:01 PM

## 2023-06-22 NOTE — Progress Notes (Signed)
Dr. Marlou Porch made aware pt is covid positive. CBC cancelled per his verbal order. Patient made aware of covid status and isolation precautions placed.

## 2023-06-22 NOTE — Progress Notes (Signed)
C/o back pain. Endorses anxiety. Tylenol 650 mg po and Atarax 25 mg po given PRN, respectively interacting with peers and staff. Playing cards and watching TV. Attends group. No behavior issues noted. Q 15 min checks maintained for safety. Plan of care continued.

## 2023-06-22 NOTE — Progress Notes (Addendum)
D- Patient alert and oriented. Calm and cooperative. Denies SI, HI, AVH. A- Scheduled medications administered to patient, per MD orders. Support and encouragement provided.  Routine safety checks conducted every 15 minutes.  Patient informed to notify staff with problems or concerns. R- No adverse drug reactions noted. Patient contracts for safety at this time. Patient compliant with medications and treatment plan. Patient interacts well with others on the unit.  Patient remains safe at this time.

## 2023-06-23 DIAGNOSIS — F19959 Other psychoactive substance use, unspecified with psychoactive substance-induced psychotic disorder, unspecified: Secondary | ICD-10-CM | POA: Diagnosis not present

## 2023-06-23 MED ORDER — LEVOFLOXACIN 750 MG PO TABS
750.0000 mg | ORAL_TABLET | Freq: Every day | ORAL | Status: DC
  Administered 2023-06-23 – 2023-06-26 (×4): 750 mg via ORAL
  Filled 2023-06-23 (×4): qty 1

## 2023-06-23 NOTE — BH IP Treatment Plan (Signed)
Interdisciplinary Treatment and Diagnostic Plan Update  06/23/2023 Time of Session: 9:00 AM  Devin [name] MRN: [id number]  Principal Diagnosis: Psychoactive substance-induced psychosis (HCC)  Secondary Diagnoses: Principal Problem:   Psychoactive substance-induced psychosis (HCC)   Current Medications:  Current Facility-Administered Medications  Medication Dose Route Frequency Provider Last Rate Last Admin   acetaminophen (TYLENOL) tablet 650 mg  650 mg Oral Q6H PRN Bennett, Christal H, NP   650 mg at 06/22/23 1800   albuterol (VENTOLIN HFA) 108 (90 Base) MCG/ACT inhaler 2 puff  2 puff Inhalation Q4H PRN Sarina Ill, DO   2 puff at 06/23/23 0750   alum & mag hydroxide-simeth (MAALOX/MYLANTA) 200-200-20 MG/5ML suspension 30 mL  30 mL Oral Q4H PRN Bennett, Christal H, NP       apixaban (ELIQUIS) tablet 2.5 mg  2.5 mg Oral BID Bennett, Christal H, NP   2.5 mg at 06/23/23 0749   diphenhydrAMINE (BENADRYL) capsule 50 mg  50 mg Oral TID PRN Bennett, Christal H, NP       Or   diphenhydrAMINE (BENADRYL) injection 50 mg  50 mg Intramuscular TID PRN Bennett, Christal H, NP       DULoxetine (CYMBALTA) DR capsule 60 mg  60 mg Oral Daily Javohn, Dawson, DO   60 mg at 06/23/23 0747   elvitegravir-cobicistat-emtricitabine-tenofovir (GENVOYA) 150-150-200-10 MG tablet 1 tablet  1 tablet Oral Q breakfast Willeen Cass, Christal H, NP   1 tablet at 06/23/23 0750   feeding supplement (ENSURE ENLIVE / ENSURE PLUS) liquid 237 mL  237 mL Oral BID BM Sarina Ill, DO   237 mL at 06/23/23 1023   gabapentin (NEURONTIN) capsule 200 mg  200 mg Oral TID Charm Rings, NP   200 mg at 06/23/23 0747   haloperidol (HALDOL) tablet 5 mg  5 mg Oral TID PRN Bennett, Christal H, NP       Or   haloperidol lactate (HALDOL) injection 5 mg  5 mg Intramuscular TID PRN Bennett, Christal H, NP       hydrOXYzine (ATARAX) tablet 25 mg  25 mg Oral TID PRN Bennett, Christal H, NP   25 mg at 06/21/23 2138    levofloxacin (LEVAQUIN) tablet 750 mg  750 mg Oral QPC breakfast Courtenay, Lowers, DO   750 mg at 06/23/23 1015   lidocaine (LIDODERM) 5 % 1 patch  1 patch Transdermal Q24H Bennett, Christal H, NP   1 patch at 06/22/23 2135   LORazepam (ATIVAN) tablet 2 mg  2 mg Oral TID PRN Thurston Hole H, NP   2 mg at 06/18/23 2112   Or   LORazepam (ATIVAN) injection 2 mg  2 mg Intramuscular TID PRN Bennett, Christal H, NP       magnesium hydroxide (MILK OF MAGNESIA) suspension 15 mL  15 mL Oral Daily PRN Bennett, Christal H, NP       methocarbamol (ROBAXIN) tablet 500 mg  500 mg Oral TID Charm Rings, NP   500 mg at 06/23/23 0749   OLANZapine (ZYPREXA) tablet 10 mg  10 mg Oral QHS Sarina Ill, DO   10 mg at 06/22/23 2135   pantoprazole (PROTONIX) EC tablet 40 mg  40 mg Oral Daily Charm Rings, NP   40 mg at 06/23/23 0747   potassium chloride SA (KLOR-CON M) CR tablet 20 mEq  20 mEq Oral QPC breakfast Charm Rings, NP   20 mEq at 06/23/23 1015   risperiDONE (RISPERDAL) tablet 0.5 mg  0.5 mg Oral [id number] Semisi, Hardey, DO   0.5 mg at 06/23/23 8295   traZODone (DESYREL) tablet 100 mg  100 mg Oral QHS Sarina Ill, DO   100 mg at 06/22/23 2007   PTA Medications: Medications Prior to Admission  Medication Sig Dispense Refill Last Dose   albuterol (PROVENTIL HFA;VENTOLIN HFA) 108 (90 BASE) MCG/ACT inhaler Inhale 2 puffs into the lungs every 4 (four) hours as needed for wheezing or shortness of breath.      amLODipine (NORVASC) 5 MG tablet Take 1 tablet (5 mg total) by mouth daily. 30 tablet 0    apixaban (ELIQUIS) 2.5 MG TABS tablet Take 1 tablet (2.5 mg total) by mouth 2 (two) times daily. 60 tablet 0    atorvastatin (LIPITOR) 10 MG tablet Take 1 tablet (10 mg total) by mouth daily. 30 tablet 0    beclomethasone (QVAR) 80 MCG/ACT inhaler Inhale 1 puff into the lungs 2 (two) times daily.      COMBIVENT RESPIMAT 20-100 MCG/ACT AERS respimat Inhale 2 puffs into  the lungs every 4 (four) hours as needed. wheezing      cyclobenzaprine (FLEXERIL) 10 MG tablet Take 10 mg by mouth 3 (three) times daily as needed for muscle spasms.      diazepam (VALIUM) 10 MG tablet Take 10 mg by mouth every 6 (six) hours as needed for anxiety.      docusate sodium (COLACE) 100 MG capsule Take 1 capsule (100 mg total) by mouth 2 (two) times daily. (Patient not taking: Reported on 06/16/2023) 10 capsule 0    elvitegravir-cobicistat-emtricitabine-tenofovir (GENVOYA) 150-150-200-10 MG TABS tablet Take 1 tablet by mouth daily with breakfast. 30 tablet 5    FLUoxetine (PROZAC) 20 MG capsule Take 1 capsule (20 mg total) by mouth daily. 30 capsule 0    furosemide (LASIX) 40 MG tablet Take 1 tablet (40 mg total) by mouth daily. 30 tablet 0    gabapentin (NEURONTIN) 100 MG capsule Take 2 capsules (200 mg total) by mouth 3 (three) times daily. 90 capsule 0    pantoprazole (PROTONIX) 40 MG tablet Take 1 tablet (40 mg total) by mouth daily. 15 tablet 0    potassium chloride SA (KLOR-CON M) 20 MEQ tablet Take 1 tablet (20 mEq total) by mouth daily after breakfast. 15 tablet 1    risperiDONE (RISPERDAL) 0.5 MG tablet Take 1 tablet (0.5 mg total) by mouth 2 (two) times daily at 8 am and 4 pm. (Patient not taking: Reported on 06/15/2023) 60 tablet 0    risperiDONE (RISPERDAL) 1 MG tablet Take 1 tablet (1 mg total) by mouth at bedtime. (Patient not taking: Reported on 06/15/2023) 30 tablet 0    traZODone (DESYREL) 100 MG tablet Take 1 tablet (100 mg total) by mouth at bedtime. (Patient not taking: Reported on 06/15/2023) 30 tablet 0     Patient Stressors:    Patient Strengths:    Treatment Modalities: Medication Management, Group therapy, Case management,  1 to 1 session with clinician, Psychoeducation, Recreational therapy.   Physician Treatment Plan for Primary Diagnosis: Psychoactive substance-induced psychosis (HCC) Long Term Goal(s): Improvement in symptoms so as ready for discharge    Short Term Goals: Ability to identify changes in lifestyle to reduce recurrence of condition will improve Ability to verbalize feelings will improve Ability to disclose and discuss suicidal ideas Ability to demonstrate self-control will improve Ability to identify and develop effective coping behaviors will improve Ability to maintain clinical measurements within normal limits will improve  Compliance with prescribed medications will improve Ability to identify triggers associated with substance abuse/mental health issues will improve  Medication Management: Evaluate patient's response, side effects, and tolerance of medication regimen.  Therapeutic Interventions: 1 to 1 sessions, Unit Group sessions and Medication administration.  Evaluation of Outcomes: Progressing  Physician Treatment Plan for Secondary Diagnosis: Principal Problem:   Psychoactive substance-induced psychosis (HCC)  Long Term Goal(s): Improvement in symptoms so as ready for discharge   Short Term Goals: Ability to identify changes in lifestyle to reduce recurrence of condition will improve Ability to verbalize feelings will improve Ability to disclose and discuss suicidal ideas Ability to demonstrate self-control will improve Ability to identify and develop effective coping behaviors will improve Ability to maintain clinical measurements within normal limits will improve Compliance with prescribed medications will improve Ability to identify triggers associated with substance abuse/mental health issues will improve     Medication Management: Evaluate patient's response, side effects, and tolerance of medication regimen.  Therapeutic Interventions: 1 to 1 sessions, Unit Group sessions and Medication administration.  Evaluation of Outcomes: Progressing   RN Treatment Plan for Primary Diagnosis: Psychoactive substance-induced psychosis (HCC) Long Term Goal(s): Knowledge of disease and therapeutic regimen to  maintain health will improve  Short Term Goals: Ability to remain free from injury will improve, Ability to verbalize frustration and anger appropriately will improve, Ability to demonstrate self-control, Ability to participate in decision making will improve, Ability to verbalize feelings will improve, Ability to disclose and discuss suicidal ideas, Ability to identify and develop effective coping behaviors will improve, and Compliance with prescribed medications will improve  Medication Management: RN will administer medications as ordered by provider, will assess and evaluate patient's response and provide education to patient for prescribed medication. RN will report any adverse and/or side effects to prescribing provider.  Therapeutic Interventions: 1 on 1 counseling sessions, Psychoeducation, Medication administration, Evaluate responses to treatment, Monitor vital signs and CBGs as ordered, Perform/monitor CIWA, COWS, AIMS and Fall Risk screenings as ordered, Perform wound care treatments as ordered.  Evaluation of Outcomes: Progressing   LCSW Treatment Plan for Primary Diagnosis: Psychoactive substance-induced psychosis (HCC) Long Term Goal(s): Safe transition to appropriate next level of care at discharge, Engage patient in therapeutic group addressing interpersonal concerns.  Short Term Goals: Engage patient in aftercare planning with referrals and resources, Increase social support, Increase ability to appropriately verbalize feelings, Increase emotional regulation, Facilitate acceptance of mental health diagnosis and concerns, Facilitate patient progression through stages of change regarding substance use diagnoses and concerns, Identify triggers associated with mental health/substance abuse issues, and Increase skills for wellness and recovery  Therapeutic Interventions: Assess for all discharge needs, 1 to 1 time with Social worker, Explore available resources and support systems,  Assess for adequacy in community support network, Educate family and significant other(s) on suicide prevention, Complete Psychosocial Assessment, Interpersonal group therapy.  Evaluation of Outcomes: Progressing   Progress in Treatment: Attending groups: Yes. and No. Participating in groups: Yes. and No. Taking medication as prescribed: Yes. Toleration medication: Yes. Family/Significant other contact made: Yes, individual(s) contacted:  Genia Del, daughter  Patient understands diagnosis: Yes. Discussing patient identified problems/goals with staff: Yes. Medical problems stabilized or resolved: No. and As evidenced by:  pt currently COVID-19 positive  Denies suicidal/homicidal ideation: Yes. Issues/concerns per patient self-inventory: No. Other: None   New problem(s) identified: No, Describe:  None identified   Update 06/23/23 No changes made   New Short Term/Long Term Goal(s): detox, elimination of symptoms of psychosis,  medication management for mood stabilization; elimination of SI thoughts; development of comprehensive mental wellness/sobriety plan. Update 06/23/23 No changes made    Patient Goals:  "that up to me" Update 06/23/23 No changes made    Discharge Plan or Barriers: CSW to assist in the development of appropriate discharge plans. Update 06/23/23 No changes made U   Reason for Continuation of Hospitalization: Anxiety Depression Medical Issues Medication stabilization Suicidal ideation   Estimated Length of Stay:  1-7 days Update 06/23/23 No changes made   Last 3 [place] Suicide Severity Risk Score: Flowsheet Row Admission (Current) from 06/17/2023 in [facility] ED from 06/13/2023 in [facility] Admission (Discharged) from 05/28/2023 in [facility]  C-SSRS RISK CATEGORY No Risk No Risk No Risk       Last PHQ 2/9 Scores:    04/04/2022    3:06 PM 05/17/2021   10:33 AM 05/08/2021     3:15 PM  Depression screen PHQ 2/9  Decreased Interest 0 3 0  Down, Depressed, Hopeless 1 3 1   PHQ - 2 Score 1 6 1   Altered sleeping  3   Tired, decreased energy  3   Change in appetite  3   Feeling bad or failure about yourself   3   Trouble concentrating  3   Moving slowly or fidgety/restless  3   Suicidal thoughts  1   PHQ-9 Score  25   Difficult doing work/chores  Very difficult     Scribe for Treatment Team: Elza Rafter, Theresia Majors 06/23/2023 12:20 PM

## 2023-06-23 NOTE — Progress Notes (Signed)
Patient c/o headache - tylenol provided.

## 2023-06-23 NOTE — Group Note (Signed)
Date:  06/23/2023 Time:  9:30 PM  Group Topic/Focus:  Self Care:   The focus of this group is to help patients understand the importance of self-care in order to improve or restore emotional, physical, spiritual, interpersonal, and financial health.    Participation Level:  Did Not Attend  Participation Quality:      Affect:      Cognitive:      Insight: None  Engagement in Group:      Modes of Intervention:      Additional Comments:    Maeola Harman 06/23/2023, 9:30 PM

## 2023-06-23 NOTE — Progress Notes (Signed)
[facility] MD Progress Note  06/23/2023 1:52 PM Devin [name]  MRN:  [id number] Subjective: Devin [name] is seen on rounds.  Tested positive for COVID yesterday.  He says he is feeling a little bit better.  I started him on antibiotics today because his chest x-ray showed bronchial thickening with airspace disease.  His commitment is up on Thursday and he asked about leaving then I said that is fine.  He is not suicidal.  I discussed this with social work.  He is tolerating the medications without any side effects. Principal Problem: Psychoactive substance-induced psychosis (HCC) Diagnosis: Principal Problem:   Psychoactive substance-induced psychosis (HCC)  Total Time spent with patient: 15 minutes  Past Psychiatric History: This is his third hospitalizations for delusional behavior  Past Medical History:  Past Medical History:  Diagnosis Date   Anxiety    Arthritis    "I'm eat up w/it" (02/20/2017)   Asthma    Chronic back pain    "the whole back" (02/20/2017)   Chronic bronchitis (HCC)    Complication of anesthesia    "felt like I couldn't breath coming out of it"   COPD (chronic obstructive pulmonary disease) (HCC)    Depression    GERD (gastroesophageal reflux disease)    History of hiatal hernia    History of stomach ulcers    "bleeding ones; I was young then"   HIV infection (HCC) dx'd ~ 1999   Hypertension    Pneumonia    "several times" (02/20/2017)   Prolapsed disk 10/28   Pulmonary embolism (HCC) 02/20/2017   Scoliosis 08/24/13    Past Surgical History:  Procedure Laterality Date   BACK SURGERY  2019   KNEE ARTHROSCOPY Right 1980s   Family History:  Family History  Problem Relation Age of Onset   Diabetes Father    Stroke Other    Colon cancer Neg Hx    Family Psychiatric  History: Unremarkable Social History:  Social History   Substance and Sexual Activity  Alcohol Use Not Currently   Comment: last used about 9 years ago     Social History   Substance and Sexual  Activity  Drug Use Yes   Types: Marijuana   Comment: denies    Social History   Socioeconomic History   Marital status: Divorced    Spouse name: Not on file   Number of children: Not on file   Years of education: Not on file   Highest education level: Not on file  Occupational History   Not on file  Tobacco Use   Smoking status: Every Day    Current packs/day: 1.00    Average packs/day: 1 pack/day for 48.0 years (48.0 ttl pk-yrs)    Types: Cigarettes   Smokeless tobacco: Never  Vaping Use   Vaping status: Never Used  Substance and Sexual Activity   Alcohol use: Not Currently    Comment: last used about 9 years ago   Drug use: Yes    Types: Marijuana    Comment: denies   Sexual activity: Yes    Partners: Female    Comment: declined condoms  Other Topics Concern   Not on file  Social History Narrative   Not on file   Social Determinants of Health   Financial Resource Strain: Not on file  Food Insecurity: Food Insecurity Present (06/17/2023)   Hunger Vital Sign    Worried About Running Out of Food in the Last Year: Sometimes true    Ran Out of Food  in the Last Year: Never true  Transportation Needs: No Transportation Needs (06/17/2023)   PRAPARE - Administrator, Civil Service (Medical): No    Lack of Transportation (Non-Medical): No  Recent Concern: Transportation Needs - Unmet Transportation Needs (05/28/2023)   PRAPARE - Administrator, Civil Service (Medical): Yes    Lack of Transportation (Non-Medical): Yes  Physical Activity: Not on file  Stress: Not on file  Social Connections: Not on file   Additional Social History:                         Sleep: Good  Appetite:  Good  Current Medications: Current Facility-Administered Medications  Medication Dose Route Frequency Provider Last Rate Last Admin   acetaminophen (TYLENOL) tablet 650 mg  650 mg Oral Q6H PRN Bennett, Christal H, NP   650 mg at 06/23/23 1350   albuterol  (VENTOLIN HFA) 108 (90 Base) MCG/ACT inhaler 2 puff  2 puff Inhalation Q4H PRN Sarina Ill, DO   2 puff at 06/23/23 1256   alum & mag hydroxide-simeth (MAALOX/MYLANTA) 200-200-20 MG/5ML suspension 30 mL  30 mL Oral Q4H PRN Bennett, Christal H, NP       apixaban (ELIQUIS) tablet 2.5 mg  2.5 mg Oral BID Bennett, Christal H, NP   2.5 mg at 06/23/23 0749   diphenhydrAMINE (BENADRYL) capsule 50 mg  50 mg Oral TID PRN Bennett, Christal H, NP       Or   diphenhydrAMINE (BENADRYL) injection 50 mg  50 mg Intramuscular TID PRN Bennett, Christal H, NP       DULoxetine (CYMBALTA) DR capsule 60 mg  60 mg Oral Daily Sandro, Crispin, DO   60 mg at 06/23/23 0747   elvitegravir-cobicistat-emtricitabine-tenofovir (GENVOYA) 150-150-200-10 MG tablet 1 tablet  1 tablet Oral Q breakfast Willeen Cass, Christal H, NP   1 tablet at 06/23/23 0750   feeding supplement (ENSURE ENLIVE / ENSURE PLUS) liquid 237 mL  237 mL Oral BID BM Sarina Ill, DO   237 mL at 06/23/23 1351   gabapentin (NEURONTIN) capsule 200 mg  200 mg Oral TID Charm Rings, NP   200 mg at 06/23/23 1246   haloperidol (HALDOL) tablet 5 mg  5 mg Oral TID PRN Bennett, Christal H, NP       Or   haloperidol lactate (HALDOL) injection 5 mg  5 mg Intramuscular TID PRN Bennett, Christal H, NP       hydrOXYzine (ATARAX) tablet 25 mg  25 mg Oral TID PRN Bennett, Christal H, NP   25 mg at 06/21/23 2138   levofloxacin (LEVAQUIN) tablet 750 mg  750 mg Oral QPC breakfast Yeshaya, [place], DO   750 mg at 06/23/23 1015   lidocaine (LIDODERM) 5 % 1 patch  1 patch Transdermal Q24H Bennett, Christal H, NP   1 patch at 06/22/23 2135   LORazepam (ATIVAN) tablet 2 mg  2 mg Oral TID PRN Thurston Hole H, NP   2 mg at 06/18/23 2112   Or   LORazepam (ATIVAN) injection 2 mg  2 mg Intramuscular TID PRN Bennett, Christal H, NP       magnesium hydroxide (MILK OF MAGNESIA) suspension 15 mL  15 mL Oral Daily PRN Bennett, Christal H, NP        methocarbamol (ROBAXIN) tablet 500 mg  500 mg Oral TID Charm Rings, NP   500 mg at 06/23/23 1247   OLANZapine (  ZYPREXA) tablet 10 mg  10 mg Oral QHS Sarina Ill, DO   10 mg at 06/22/23 2135   pantoprazole (PROTONIX) EC tablet 40 mg  40 mg Oral Daily Charm Rings, NP   40 mg at 06/23/23 0747   potassium chloride SA (KLOR-CON M) CR tablet 20 mEq  20 mEq Oral QPC breakfast Charm Rings, NP   20 mEq at 06/23/23 1015   risperiDONE (RISPERDAL) tablet 0.5 mg  0.5 mg Oral [id number] Thoryn, Davidoff, DO   0.5 mg at 06/23/23 2956   traZODone (DESYREL) tablet 100 mg  100 mg Oral QHS Sarina Ill, DO   100 mg at 06/22/23 2007    Lab Results:  Results for orders placed or performed during the hospital encounter of 06/17/23 (from the past 48 hour(s))  SARS Coronavirus 2 by RT PCR (hospital order, performed in [facility] hospital lab) *cepheid single result test* Anterior Nasal Swab     Status: Abnormal   Collection Time: 06/22/23 12:00 PM   Specimen: Anterior Nasal Swab  Result Value Ref Range   SARS Coronavirus 2 by RT PCR POSITIVE (A) NEGATIVE    Comment: (NOTE) SARS-CoV-2 target nucleic acids are DETECTED  SARS-CoV-2 RNA is generally detectable in upper respiratory specimens  during the acute phase of infection.  Positive results are indicative  of the presence of the identified virus, but do not rule out bacterial infection or co-infection with other pathogens not detected by the test.  Clinical correlation with patient history and  other diagnostic information is necessary to determine patient infection status.  The expected result is negative.  Fact Sheet for Patients:   RoadLapTop.co.za   Fact Sheet for Healthcare Providers:   http://kim-miller.com/    This test is not yet approved or cleared by the [place] FDA and  has been authorized for detection and/or diagnosis of SARS-CoV-2 by FDA under an  Emergency Use Authorization (EUA).  This EUA will remain in effect (meaning this test can be used) for the duration of  the COVID-19 declaration under Section 564(b)(1)  of the Act, 21 U.S.C. section 360-bbb-3(b)(1), unless the authorization is terminated or revoked sooner.   Performed at [facility], [address] [place], [place] [address]     Blood Alcohol level:  Lab Results  Component Value Date   [facility] <10 06/13/2023   ETH <10 05/27/2023    Metabolic Disorder Labs: Lab Results  Component Value Date   HGBA1C 5.8 (H) 06/01/2023   MPG 119.76 06/01/2023   MPG 120 02/20/2017   No results found for: "PROLACTIN" Lab Results  Component Value Date   CHOL 124 06/01/2023   TRIG 81 06/01/2023   HDL 40 (L) 06/01/2023   CHOLHDL 3.1 06/01/2023   VLDL 16 06/01/2023   LDLCALC 68 06/01/2023   LDLCALC  04/04/2022     Comment:     . LDL cholesterol not calculated. Triglyceride levels greater than 400 mg/dL invalidate calculated LDL results. . Reference range: <100 . Desirable range <100 mg/dL for primary prevention;   <70 mg/dL for patients with CHD or diabetic patients  with > or = 2 CHD risk factors. Marland Kitchen LDL-C is now calculated using the Martin-Hopkins  calculation, which is a validated novel method providing  better accuracy than the Friedewald equation in the  estimation of LDL-C.  Horald Pollen et al. Lenox Ahr. 6578;469(62): 2061-2068  (http://education.[facility].com/faq/FAQ164)     Physical Findings: AIMS:  , ,  ,  ,  CIWA:    COWS:     Musculoskeletal: Strength & Muscle Tone: within normal limits Gait & Station: normal Patient leans: N/A  Psychiatric Specialty Exam:  Presentation  General Appearance:  Disheveled  Eye Contact: Fair  Speech: Pressured  Speech Volume: Normal  Handedness: Right   Mood and Affect  Mood: Anxious; Irritable  Affect: Restricted   Thought Process  Thought Processes: Disorganized  Descriptions  of Associations:Loose  Orientation:Partial  Thought Content:Delusions; Paranoid Ideation; Scattered  History of Schizophrenia/Schizoaffective disorder:No  Duration of Psychotic Symptoms:Less than six months  Hallucinations:No data recorded Ideas of Reference:Paranoia; Delusions  Suicidal Thoughts:No data recorded Homicidal Thoughts:No data recorded  Sensorium  Memory: Immediate Fair; Remote Fair  Judgment: Poor  Insight: Poor   Executive Functions  Concentration: Poor  Attention Span: Fair  Recall: Poor  Fund of Knowledge: Fair  Language: Fair   Psychomotor Activity  Psychomotor Activity:No data recorded  Assets  Assets: Financial Resources/Insurance; Social Support; Housing   Sleep  Sleep:No data recorded    Blood pressure (!) 103/55, pulse 97, temperature 98.6 F (37 C), resp. rate 18, height 6\' 1"  (1.854 m), weight 104.6 kg, SpO2 94%. Body mass index is 30.42 kg/m.   Treatment Plan Summary: Daily contact with patient to assess and evaluate symptoms and progress in treatment, Medication management, and Plan start Levaquin 750 mg/day.  Sarina Ill, DO 06/23/2023, 1:52 PM

## 2023-06-23 NOTE — Group Note (Signed)
Date:  06/23/2023 Time:  10:29 AM  Group Topic/Focus:  Making Healthy Choices:   The focus of this group is to help patients identify negative/unhealthy choices they were using prior to admission and identify positive/healthier coping strategies to replace them upon discharge.    Participation Level:  Did Not Attend    Devin [name] 06/23/2023, 10:29 AM

## 2023-06-23 NOTE — Progress Notes (Signed)
   06/23/23 2100  Psych Admission Type (Psych Patients Only)  Admission Status Voluntary  Psychosocial Assessment  Patient Complaints Worrying  Eye Contact Fair  Facial Expression Flat  Affect Appropriate to circumstance  Speech Logical/coherent  Interaction Assertive  Motor Activity Slow  Appearance/Hygiene In scrubs  Behavior Characteristics Appropriate to situation  Mood Depressed  Thought Process  Coherency WDL  Content WDL  Delusions None reported or observed  Perception WDL  Hallucination None reported or observed  Judgment Impaired  Confusion Mild  Danger to Self  Current suicidal ideation? Denies  Agreement Not to Harm Self Yes  Description of Agreement VERBAL  Danger to Others  Danger to Others None reported or observed

## 2023-06-23 NOTE — Progress Notes (Signed)
Patient is IVC'd to [facility] for substance induced psychosis.  Currently A&Ox3 and is on isolation/ respiratory precautions d/t being dx with Covid yesterday. Symptoms include cough, congestion, body aches and headache.  Overall improvement noted today., VSS.  Denies S/I, H/I, AVH.  Plan for discharge is Thursday if he feels better (Covid related). Has been compliant with staying in his room. Ambulated to the bathroom himself without walker. Has a unit cell phone at bedside for personal use. Will continue to monitor.

## 2023-06-23 NOTE — Progress Notes (Signed)
Patient remains under covid precautions, he remains sad and isolative to the room.  Denies SI, HI or AVH. He was compliant with medications on the shift. No new behavioral issues on shift to report on shift at this time.

## 2023-06-23 NOTE — Progress Notes (Signed)
Patient is covid positive and isolated in room. Respiratory assessment completed this morning with morning meds that included prn albuterol.  C/o congestion, headache, body aches. States "lungs feel worse on the left". Noted to be slightly diminished on the left but clear in all fields. Sp02 94%, bp was 103/55 so amlodipine held but took all other meds.

## 2023-06-23 NOTE — Plan of Care (Signed)
  Problem: Clinical Measurements: Goal: Ability to maintain clinical measurements within normal limits will improve Outcome: Not Progressing Goal: Will remain free from infection Outcome: Not Progressing Goal: Diagnostic test results will improve Outcome: Not Progressing Goal: Respiratory complications will improve Outcome: Not Progressing

## 2023-06-24 NOTE — Group Note (Signed)
Recreation Therapy Group Note   Group Topic:Coping Skills  Group Date: 06/24/2023 Start Time: 1400 End Time: 1445 Facilitators: Rosina Lowenstein, LRT, CTRS Location:  Day Room  Group Description: Mind Map.  Patient was provided a blank template of a diagram with 32 blank boxes in a tiered system, branching from the center (similar to a bubble chart). LRT directed patients to label the middle of the diagram "Coping Skills". LRT and patients then came up with 8 different coping skills as examples. Pt were directed to record their coping skills in the 2nd tier boxes closest to the center.  Patients would then share their coping skills with the group as LRT wrote them out. LRT gave a handout of 99 different coping skills at the end of group.    Goal Area(s) Addressed: Patients will be able to define "coping skills". Patient will identify new coping skills.  Patient will increase communication.  Affect/Mood: N/A   Participation Level: Did not attend    Clinical Observations/Individualized Feedback: Devin [name] did not attend due to having covid.   Plan: Continue to engage patient in RT group sessions 2-3x/week.   [address], LRT, CTRS 06/24/2023 3:17 PM

## 2023-06-24 NOTE — Progress Notes (Signed)
Patient initially admitted IVC to behavioral medicine unit on June 18, 2023 and transferred to [facility] unit on August 22. Patient was exhibiting paranoia and was making bizarre statements on the telephone to his children. Diagnosis of substance induced psychosis. Patient was recently admitted to the [facility] floor for similar presentation.  Patient denies SI/HI/AVH. He endorses depression due to being on isolation/airborne precautions for testing positive for Covid 2 days ago. Albuterol inhaler PRN adm for SOB and Tylenol 650 mg PRN adm at 1000 for 8/10 back pain. Upon follow up, patient appeared to be resting in bed.  Q15 minute unit checks in place.

## 2023-06-24 NOTE — Group Note (Signed)
Date:  06/24/2023 Time:  10:20 PM  Group Topic/Focus:  Goals Group:   The focus of this group is to help patients establish daily goals to achieve during treatment and discuss how the patient can incorporate goal setting into their daily lives to aide in recovery.    Participation Level:  Did Not Attend  Participation Quality:      Affect:      Cognitive:      Insight: None  Engagement in Group:  None  Modes of Intervention:  Discussion  Additional Comments:    Maeola Harman 06/24/2023, 10:20 PM

## 2023-06-24 NOTE — Group Note (Signed)
Date:  06/24/2023 Time:  10:32 AM  Group Topic/Focus:  Activity Group:  The focus of this group is to promote activity for the patients to allow them to get some fresh air and also allow them to exercise.     Participation Level:  Did Not Attend   Devin [name] Issacc Merlo 06/24/2023, 10:32 AM

## 2023-06-24 NOTE — Progress Notes (Signed)
   06/24/23 2138  Psych Admission Type (Psych Patients Only)  Admission Status Voluntary  Psychosocial Assessment  Patient Complaints Worrying  Eye Contact Fair  Facial Expression Flat  Affect Appropriate to circumstance  Speech Logical/coherent  Interaction Assertive  Motor Activity Slow;Unsteady  Appearance/Hygiene In scrubs  Behavior Characteristics Appropriate to situation  Mood Pleasant  Thought Process  Coherency WDL  Content WDL  Delusions None reported or observed  Perception WDL  Hallucination None reported or observed  Judgment Impaired  Confusion Mild  Danger to Self  Current suicidal ideation? Denies

## 2023-06-24 NOTE — Group Note (Signed)
[facility] LCSW Group Therapy Note   Group Date: 06/24/2023 Start Time: 1315 End Time: 1400   Type of Therapy/Topic:  Group Therapy:  Emotion Regulation  Participation Level:  Did Not Attend   Mood:  Description of Group:    The purpose of this group is to assist patients in learning to regulate negative emotions and experience positive emotions. Patients will be guided to discuss ways in which they have been vulnerable to their negative emotions. These vulnerabilities will be juxtaposed with experiences of positive emotions or situations, and patients challenged to use positive emotions to combat negative ones. Special emphasis will be placed on coping with negative emotions in conflict situations, and patients will process healthy conflict resolution skills.  Therapeutic Goals: Patient will identify two positive emotions or experiences to reflect on in order to balance out negative emotions:  Patient will label two or more emotions that they find the most difficult to experience:  Patient will be able to demonstrate positive conflict resolution skills through discussion or role plays:   Summary of Patient Progress:   Pt unable to attend, COVID-19 positive.     Therapeutic Modalities:   Cognitive Behavioral Therapy Feelings Identification Dialectical Behavioral Therapy   Elza Rafter, LCSWA

## 2023-06-24 NOTE — Plan of Care (Signed)
  Problem: Clinical Measurements: Goal: Ability to maintain clinical measurements within normal limits will improve Outcome: Not Progressing Goal: Will remain free from infection Outcome: Not Progressing Goal: Diagnostic test results will improve Outcome: Not Progressing Goal: Respiratory complications will improve Outcome: Not Progressing

## 2023-06-24 NOTE — Plan of Care (Signed)
  Problem: Health Behavior/Discharge Planning: Goal: Ability to manage health-related needs will improve Outcome: Progressing   Problem: Activity: Goal: Risk for activity intolerance will decrease Outcome: Progressing   

## 2023-06-24 NOTE — Progress Notes (Signed)
COVID Assessment: Patient remains on Airborne and Contact Precautions for positive COVID 19 test result that was obtained on 06/22/23. Patient reports to this RN that he has felt significantly better today and reports most of his symptoms have resolved. Patient reports ongoing sinus pressure and congestion. Patient denies fevers, chills, body aches, chest pain, throat pain, cough and GI upset. Patient reports he is drinking plenty of fluids and getting rest. Lung sounds clear bilaterally to auscultation. Patient reports that the PRN Albuterol inhaler has helped his condition significantly and he requests an inhalation at this time. PRN Albuterol administered per orders. Patient provided with Gatorade. Patient denies any further needs at this time. 15 minute safety checks remain in place.

## 2023-06-24 NOTE — Progress Notes (Signed)
[facility] MD Progress Note  06/24/2023 1:58 PM Devin [name]  MRN:  [id number] Subjective: Devin [name] is seen on rounds.  After starting the antibiotic he is starting to feel better.  He denies being suicidal.  It is always unclear as to what happens up for the police get to his house and bring the hospital.  It usually involves a gun but he says that his neighbors are always calling the police on him.  He denies any homicidal ideation.  He he does have family close by.  He denies depression.  He does better on medications, though.  Principal Problem: Psychoactive substance-induced psychosis (HCC) Diagnosis: Principal Problem:   Psychoactive substance-induced psychosis (HCC)  Total Time spent with patient: 15 minutes  Past Psychiatric History: Delusions  Past Medical History:  Past Medical History:  Diagnosis Date   Anxiety    Arthritis    "I'm eat up w/it" (02/20/2017)   Asthma    Chronic back pain    "the whole back" (02/20/2017)   Chronic bronchitis (HCC)    Complication of anesthesia    "felt like I couldn't breath coming out of it"   COPD (chronic obstructive pulmonary disease) (HCC)    Depression    GERD (gastroesophageal reflux disease)    History of hiatal hernia    History of stomach ulcers    "bleeding ones; I was young then"   HIV infection (HCC) dx'd ~ 1999   Hypertension    Pneumonia    "several times" (02/20/2017)   Prolapsed disk 10/28   Pulmonary embolism (HCC) 02/20/2017   Scoliosis 08/24/13    Past Surgical History:  Procedure Laterality Date   BACK SURGERY  2019   KNEE ARTHROSCOPY Right 1980s   Family History:  Family History  Problem Relation Age of Onset   Diabetes Father    Stroke Other    Colon cancer Neg Hx    Family Psychiatric  History: Unremarkable Social History:  Social History   Substance and Sexual Activity  Alcohol Use Not Currently   Comment: last used about 9 years ago     Social History   Substance and Sexual Activity  Drug Use Yes    Types: Marijuana   Comment: denies    Social History   Socioeconomic History   Marital status: Divorced    Spouse name: Not on file   Number of children: Not on file   Years of education: Not on file   Highest education level: Not on file  Occupational History   Not on file  Tobacco Use   Smoking status: Every Day    Current packs/day: 1.00    Average packs/day: 1 pack/day for 48.0 years (48.0 ttl pk-yrs)    Types: Cigarettes   Smokeless tobacco: Never  Vaping Use   Vaping status: Never Used  Substance and Sexual Activity   Alcohol use: Not Currently    Comment: last used about 9 years ago   Drug use: Yes    Types: Marijuana    Comment: denies   Sexual activity: Yes    Partners: Female    Comment: declined condoms  Other Topics Concern   Not on file  Social History Narrative   Not on file   Social Determinants of Health   Financial Resource Strain: Not on file  Food Insecurity: Food Insecurity Present (06/17/2023)   Hunger Vital Sign    Worried About Running Out of Food in the Last Year: Sometimes true    Ran Out  of Food in the Last Year: Never true  Transportation Needs: No Transportation Needs (06/17/2023)   PRAPARE - Administrator, Civil Service (Medical): No    Lack of Transportation (Non-Medical): No  Recent Concern: Transportation Needs - Unmet Transportation Needs (05/28/2023)   PRAPARE - Administrator, Civil Service (Medical): Yes    Lack of Transportation (Non-Medical): Yes  Physical Activity: Not on file  Stress: Not on file  Social Connections: Not on file   Additional Social History:                         Sleep: Good  Appetite:  Good  Current Medications: Current Facility-Administered Medications  Medication Dose Route Frequency Provider Last Rate Last Admin   acetaminophen (TYLENOL) tablet 650 mg  650 mg Oral Q6H PRN Bennett, Christal H, NP   650 mg at 06/24/23 0957   albuterol (VENTOLIN HFA) 108 (90 Base)  MCG/ACT inhaler 2 puff  2 puff Inhalation Q4H PRN Sarina Ill, DO   2 puff at 06/23/23 2041   alum & mag hydroxide-simeth (MAALOX/MYLANTA) 200-200-20 MG/5ML suspension 30 mL  30 mL Oral Q4H PRN Bennett, Christal H, NP       apixaban (ELIQUIS) tablet 2.5 mg  2.5 mg Oral BID Bennett, Christal H, NP   2.5 mg at 06/24/23 0957   diphenhydrAMINE (BENADRYL) capsule 50 mg  50 mg Oral TID PRN Bennett, Christal H, NP       Or   diphenhydrAMINE (BENADRYL) injection 50 mg  50 mg Intramuscular TID PRN Bennett, Christal H, NP       DULoxetine (CYMBALTA) DR capsule 60 mg  60 mg Oral Daily Faye, Doar, DO   60 mg at 06/24/23 1610   elvitegravir-cobicistat-emtricitabine-tenofovir (GENVOYA) 150-150-200-10 MG tablet 1 tablet  1 tablet Oral Q breakfast Willeen Cass, Christal H, NP   1 tablet at 06/24/23 0957   feeding supplement (ENSURE ENLIVE / ENSURE PLUS) liquid 237 mL  237 mL Oral BID BM Sarina Ill, DO   237 mL at 06/23/23 1351   gabapentin (NEURONTIN) capsule 200 mg  200 mg Oral TID Charm Rings, NP   200 mg at 06/24/23 1239   haloperidol (HALDOL) tablet 5 mg  5 mg Oral TID PRN Bennett, Christal H, NP       Or   haloperidol lactate (HALDOL) injection 5 mg  5 mg Intramuscular TID PRN Bennett, Christal H, NP       hydrOXYzine (ATARAX) tablet 25 mg  25 mg Oral TID PRN Bennett, Christal H, NP   25 mg at 06/21/23 2138   levofloxacin (LEVAQUIN) tablet 750 mg  750 mg Oral QPC breakfast Bernardino, Mctiernan, DO   750 mg at 06/24/23 9604   lidocaine (LIDODERM) 5 % 1 patch  1 patch Transdermal Q24H Bennett, Christal H, NP   1 patch at 06/23/23 2041   LORazepam (ATIVAN) tablet 2 mg  2 mg Oral TID PRN Thurston Hole H, NP   2 mg at 06/18/23 2112   Or   LORazepam (ATIVAN) injection 2 mg  2 mg Intramuscular TID PRN Bennett, Christal H, NP       magnesium hydroxide (MILK OF MAGNESIA) suspension 15 mL  15 mL Oral Daily PRN Bennett, Christal H, NP       methocarbamol (ROBAXIN) tablet 500  mg  500 mg Oral TID Charm Rings, NP   500 mg at 06/24/23 1239  OLANZapine (ZYPREXA) tablet 10 mg  10 mg Oral QHS Sarina Ill, DO   10 mg at 06/23/23 2042   pantoprazole (PROTONIX) EC tablet 40 mg  40 mg Oral Daily Charm Rings, NP   40 mg at 06/24/23 0957   potassium chloride SA (KLOR-CON M) CR tablet 20 mEq  20 mEq Oral QPC breakfast Charm Rings, NP   20 mEq at 06/24/23 0957   risperiDONE (RISPERDAL) tablet 0.5 mg  0.5 mg Oral [id number] Jaimie, [place], DO   0.5 mg at 06/24/23 4540   traZODone (DESYREL) tablet 100 mg  100 mg Oral QHS Sarina Ill, DO   100 mg at 06/23/23 2042    Lab Results: No results found for this or any previous visit (from the past 48 hour(s)).  Blood Alcohol level:  Lab Results  Component Value Date   ETH <10 06/13/2023   ETH <10 05/27/2023    Metabolic Disorder Labs: Lab Results  Component Value Date   HGBA1C 5.8 (H) 06/01/2023   MPG 119.76 06/01/2023   MPG 120 02/20/2017   No results found for: "PROLACTIN" Lab Results  Component Value Date   CHOL 124 06/01/2023   TRIG 81 06/01/2023   HDL 40 (L) 06/01/2023   CHOLHDL 3.1 06/01/2023   VLDL 16 06/01/2023   LDLCALC 68 06/01/2023   LDLCALC  04/04/2022     Comment:     . LDL cholesterol not calculated. Triglyceride levels greater than 400 mg/dL invalidate calculated LDL results. . Reference range: <100 . Desirable range <100 mg/dL for primary prevention;   <70 mg/dL for patients with CHD or diabetic patients  with > or = 2 CHD risk factors. Marland Kitchen LDL-C is now calculated using the Martin-Hopkins  calculation, which is a validated novel method providing  better accuracy than the Friedewald equation in the  estimation of LDL-C.  Horald Pollen et al. Lenox Ahr. 9811;914(78): 2061-2068  (http://education.[employer].com/faq/FAQ164)     Physical Findings: AIMS:  , ,  ,  ,    CIWA:    COWS:     Musculoskeletal: Strength & Muscle Tone: within normal limits Gait  & Station: normal Patient leans: N/A  Psychiatric Specialty Exam:  Presentation  General Appearance:  Disheveled  Eye Contact: Fair  Speech: Pressured  Speech Volume: Normal  Handedness: Right   Mood and Affect  Mood: Anxious; Irritable  Affect: Restricted   Thought Process  Thought Processes: Disorganized  Descriptions of Associations:Loose  Orientation:Partial  Thought Content:Delusions; Paranoid Ideation; Scattered  History of Schizophrenia/Schizoaffective disorder:No  Duration of Psychotic Symptoms:Less than six months  Hallucinations:No data recorded Ideas of Reference:Paranoia; Delusions  Suicidal Thoughts:No data recorded Homicidal Thoughts:No data recorded  Sensorium  Memory: Immediate Fair; Remote Fair  Judgment: Poor  Insight: Poor   Executive Functions  Concentration: Poor  Attention Span: Fair  Recall: Poor  Fund of Knowledge: Fair  Language: Fair   Psychomotor Activity  Psychomotor Activity:No data recorded  Assets  Assets: Financial Resources/Insurance; Social Support; Housing   Sleep  Sleep:No data recorded    Blood pressure 108/65, pulse 97, temperature 98.6 F (37 C), resp. rate 14, height 6\' 1"  (1.854 m), weight 104.6 kg, SpO2 97%. Body mass index is 30.42 kg/m.   Treatment Plan Summary: Daily contact with patient to assess and evaluate symptoms and progress in treatment, Medication management, and Plan continue current medications.  Sarina Ill, DO 06/24/2023, 1:58 PM

## 2023-06-25 ENCOUNTER — Other Ambulatory Visit (HOSPITAL_COMMUNITY): Payer: Self-pay

## 2023-06-25 DIAGNOSIS — F19959 Other psychoactive substance use, unspecified with psychoactive substance-induced psychotic disorder, unspecified: Secondary | ICD-10-CM | POA: Diagnosis not present

## 2023-06-25 NOTE — Progress Notes (Signed)
   06/25/23 0600  15 Minute Checks  Location Bedroom  Visual Appearance Calm  Behavior Sleeping  Sleep (Behavioral Health Patients Only)  Calculate sleep? (Click Yes once per 24 hr at 0600 safety check) Yes  Documented sleep last 24 hours 8

## 2023-06-25 NOTE — Progress Notes (Signed)
Patient initially admitted IVC to behavioral medicine unit on June 18, 2023 and transferred to [facility] unit on August 22. Patient was exhibiting paranoia and was making bizarre statements on the telephone to his children. Diagnosis of substance induced psychosis. Patient was recently admitted to the [facility] floor for similar presentation.   Patient denies SI/HI/AVH. He endorses depression due to being on isolation/airborne precautions for testing positive for Covid on June 22, 2023. He states that he is feeling better and would like to go home to cut the grass and pay his bills. Support and encouragement offered. Tylenol 650 mg PRN adm for 8/10 back pain and Albuterol inhaler PRN adm for SOB at 0935. Upon follow up, patient had no further pain or SOB complaints.  Patient discharge is planned for tomorrow June 26, 2023.

## 2023-06-25 NOTE — Group Note (Signed)
Recreation Therapy Group Note   Group Topic:Coping Skills  Group Date: 06/25/2023 Start Time: 0930 End Time: 1020 Facilitators: Rosina Lowenstein, LRT, CTRS Location: Courtyard  Group Description: Music. Patients encouraged to name their favorite song(s) for LRT to play song through speaker for group to hear. Patient educated on the definition of leisure and the importance of having different leisure interests outside of the hospital. Group discussed how leisure activities can often be used as Pharmacologist and that listening to music is one example.   Goal Area(s) Addressed:  Patient will identify a current leisure interest.  Patient will practice making a positive decision. Patient will have the opportunity to try a new leisure activity.   Affect/Mood: N/A   Participation Level: Did not attend    Clinical Observations/Individualized Feedback: Mjaor did not attend group due to having covid.  Plan: Continue to engage patient in RT group sessions 2-3x/week.   Rosina Lowenstein, LRT, CTRS 06/25/2023 12:30 PM

## 2023-06-25 NOTE — Plan of Care (Signed)
Assumed care of patient, this pm, patient sitting up in bed, affect seems brighter than in previous days, reported feeling better, "I don't think I have COVID any way".  Affect and mood pleasant and patient denied SI/HI plan or intent and is looking forward to going home tomorrow. Patient is compliant with his medication regimen and denied side effect. Will continue to monitor for safety and follow plan of care. Problem: Education: Goal: Knowledge of General Education information will improve Description: Including pain rating scale, medication(s)/side effects and non-pharmacologic comfort measures Outcome: Progressing   Problem: Clinical Measurements: Goal: Ability to maintain clinical measurements within normal limits will improve Outcome: Progressing Goal: Will remain free from infection Outcome: Not Progressing Goal: Diagnostic test results will improve Outcome: Progressing Goal: Respiratory complications will improve Outcome: Progressing Goal: Cardiovascular complication will be avoided Outcome: Progressing   Problem: Activity: Goal: Risk for activity intolerance will decrease Outcome: Progressing   Problem: Nutrition: Goal: Adequate nutrition will be maintained Outcome: Progressing   Problem: Coping: Goal: Level of anxiety will decrease Outcome: Progressing

## 2023-06-25 NOTE — Plan of Care (Signed)
  Problem: Education: Goal: Knowledge of General Education information will improve Description: Including pain rating scale, medication(s)/side effects and non-pharmacologic comfort measures Outcome: Adequate for Discharge   Problem: Health Behavior/Discharge Planning: Goal: Ability to manage health-related needs will improve Outcome: Adequate for Discharge   Problem: Clinical Measurements: Goal: Ability to maintain clinical measurements within normal limits will improve Outcome: Adequate for Discharge Goal: Will remain free from infection Outcome: Adequate for Discharge Goal: Diagnostic test results will improve Outcome: Adequate for Discharge Goal: Respiratory complications will improve Outcome: Adequate for Discharge Goal: Cardiovascular complication will be avoided Outcome: Adequate for Discharge   Problem: Activity: Goal: Risk for activity intolerance will decrease Outcome: Adequate for Discharge   Problem: Nutrition: Goal: Adequate nutrition will be maintained Outcome: Adequate for Discharge   Problem: Coping: Goal: Level of anxiety will decrease Outcome: Adequate for Discharge   Problem: Elimination: Goal: Will not experience complications related to urinary retention Outcome: Adequate for Discharge   Problem: Pain Managment: Goal: General experience of comfort will improve Outcome: Adequate for Discharge   Problem: Safety: Goal: Ability to remain free from injury will improve Outcome: Adequate for Discharge   Problem: Skin Integrity: Goal: Risk for impaired skin integrity will decrease Outcome: Adequate for Discharge   Problem: Education: Goal: Knowledge of [place] General Education information/materials will improve Outcome: Adequate for Discharge Goal: Emotional status will improve Outcome: Adequate for Discharge Goal: Mental status will improve Outcome: Adequate for Discharge Goal: Verbalization of understanding the information provided will  improve Outcome: Adequate for Discharge   Problem: Activity: Goal: Interest or engagement in activities will improve Outcome: Adequate for Discharge Goal: Sleeping patterns will improve Outcome: Adequate for Discharge   Problem: Coping: Goal: Ability to verbalize frustrations and anger appropriately will improve Outcome: Adequate for Discharge Goal: Ability to demonstrate self-control will improve Outcome: Adequate for Discharge   Problem: Health Behavior/Discharge Planning: Goal: Identification of resources available to assist in meeting health care needs will improve Outcome: Adequate for Discharge Goal: Compliance with treatment plan for underlying cause of condition will improve Outcome: Adequate for Discharge   Problem: Physical Regulation: Goal: Ability to maintain clinical measurements within normal limits will improve Outcome: Adequate for Discharge   Problem: Safety: Goal: Periods of time without injury will increase Outcome: Adequate for Discharge

## 2023-06-25 NOTE — Group Note (Signed)
Date:  06/25/2023 Time:  8:28 PM  Group Topic/Focus:  Goals Group:   The focus of this group is to help patients establish daily goals to achieve during treatment and discuss how the patient can incorporate goal setting into their daily lives to aide in recovery.    Participation Level:  Did Not Attend  Participation Quality:   Did Not Attend  Affect:   Did Not Attend  Cognitive:   Did Not Attend  Insight: Did Not Attend  Engagement in Group:  None  Modes of Intervention:   Did Not Attend  Additional Comments:    [name] 06/25/2023, 8:28 PM

## 2023-06-25 NOTE — Progress Notes (Signed)
[facility] MD Progress Note  06/25/2023 2:16 PM Devin [name]  MRN:  [id number] Subjective: Devin [name] is seen on rounds.  He is feeling better.  We talked about discharge tomorrow.  He has been compliant with medications.  He denies any suicidal or homicidal ideation.  [facility] [place].  He goes to [employer]. Principal Problem: Psychoactive substance-induced psychosis (HCC) Diagnosis: Principal Problem:   Psychoactive substance-induced psychosis (HCC)  Total Time spent with patient: 15 minutes  Past Psychiatric History: This is his third hospitalization in the past year for similar presentation  Past Medical History:  Past Medical History:  Diagnosis Date   Anxiety    Arthritis    "I'm eat up w/it" (02/20/2017)   Asthma    Chronic back pain    "the whole back" (02/20/2017)   Chronic bronchitis (HCC)    Complication of anesthesia    "felt like I couldn't breath coming out of it"   COPD (chronic obstructive pulmonary disease) (HCC)    Depression    GERD (gastroesophageal reflux disease)    History of hiatal hernia    History of stomach ulcers    "bleeding ones; I was young then"   HIV infection (HCC) dx'd ~ 1999   Hypertension    Pneumonia    "several times" (02/20/2017)   Prolapsed disk 10/28   Pulmonary embolism (HCC) 02/20/2017   Scoliosis 08/24/13    Past Surgical History:  Procedure Laterality Date   BACK SURGERY  2019   KNEE ARTHROSCOPY Right 1980s   Family History:  Family History  Problem Relation Age of Onset   Diabetes Father    Stroke Other    Colon cancer Neg Hx    Family Psychiatric  History: Unremarkable Social History:  Social History   Substance and Sexual Activity  Alcohol Use Not Currently   Comment: last used about 9 years ago     Social History   Substance and Sexual Activity  Drug Use Yes   Types: Marijuana   Comment: denies    Social History   Socioeconomic History   Marital status: Divorced    Spouse  name: Not on file   Number of children: Not on file   Years of education: Not on file   Highest education level: Not on file  Occupational History   Not on file  Tobacco Use   Smoking status: Every Day    Current packs/day: 1.00    Average packs/day: 1 pack/day for 48.0 years (48.0 ttl pk-yrs)    Types: Cigarettes   Smokeless tobacco: Never  Vaping Use   Vaping status: Never Used  Substance and Sexual Activity   Alcohol use: Not Currently    Comment: last used about 9 years ago   Drug use: Yes    Types: Marijuana    Comment: denies   Sexual activity: Yes    Partners: Female    Comment: declined condoms  Other Topics Concern   Not on file  Social History Narrative   Not on file   Social Determinants of Health   Financial Resource Strain: Not on file  Food Insecurity: Food Insecurity Present (06/17/2023)   Hunger Vital Sign    Worried About Running Out of Food in the Last Year: Sometimes true    Ran Out of Food in the Last Year: Never true  Transportation Needs: No Transportation Needs (06/17/2023)   PRAPARE - Administrator, Civil Service (Medical): No  Lack of Transportation (Non-Medical): No  Recent Concern: Transportation Needs - Unmet Transportation Needs (05/28/2023)   PRAPARE - Administrator, Civil Service (Medical): Yes    Lack of Transportation (Non-Medical): Yes  Physical Activity: Not on file  Stress: Not on file  Social Connections: Not on file   Additional Social History:                         Sleep: Good  Appetite:  Good  Current Medications: Current Facility-Administered Medications  Medication Dose Route Frequency Provider Last Rate Last Admin   acetaminophen (TYLENOL) tablet 650 mg  650 mg Oral Q6H PRN Bennett, Christal H, NP   650 mg at 06/25/23 0939   albuterol (VENTOLIN HFA) 108 (90 Base) MCG/ACT inhaler 2 puff  2 puff Inhalation Q4H PRN Sarina Ill, DO   2 puff at 06/25/23 0935   alum & mag  hydroxide-simeth (MAALOX/MYLANTA) 200-200-20 MG/5ML suspension 30 mL  30 mL Oral Q4H PRN Bennett, Christal H, NP       apixaban (ELIQUIS) tablet 2.5 mg  2.5 mg Oral BID Bennett, Christal H, NP   2.5 mg at 06/25/23 0936   diphenhydrAMINE (BENADRYL) capsule 50 mg  50 mg Oral TID PRN Bennett, Christal H, NP       Or   diphenhydrAMINE (BENADRYL) injection 50 mg  50 mg Intramuscular TID PRN Bennett, Christal H, NP       DULoxetine (CYMBALTA) DR capsule 60 mg  60 mg Oral Daily Kysen, Wolsky, DO   60 mg at 06/25/23 4034   elvitegravir-cobicistat-emtricitabine-tenofovir (GENVOYA) 150-150-200-10 MG tablet 1 tablet  1 tablet Oral Q breakfast Willeen Cass, Christal H, NP   1 tablet at 06/25/23 0937   feeding supplement (ENSURE ENLIVE / ENSURE PLUS) liquid 237 mL  237 mL Oral BID BM Sarina Ill, DO   237 mL at 06/25/23 0935   gabapentin (NEURONTIN) capsule 200 mg  200 mg Oral TID Charm Rings, NP   200 mg at 06/25/23 1257   haloperidol (HALDOL) tablet 5 mg  5 mg Oral TID PRN Bennett, Christal H, NP       Or   haloperidol lactate (HALDOL) injection 5 mg  5 mg Intramuscular TID PRN Bennett, Christal H, NP       hydrOXYzine (ATARAX) tablet 25 mg  25 mg Oral TID PRN Bennett, Christal H, NP   25 mg at 06/21/23 2138   levofloxacin (LEVAQUIN) tablet 750 mg  750 mg Oral QPC breakfast Sari, Petrou, DO   750 mg at 06/25/23 0936   lidocaine (LIDODERM) 5 % 1 patch  1 patch Transdermal Q24H Bennett, Christal H, NP   1 patch at 06/24/23 2103   LORazepam (ATIVAN) tablet 2 mg  2 mg Oral TID PRN Thurston Hole H, NP   2 mg at 06/18/23 2112   Or   LORazepam (ATIVAN) injection 2 mg  2 mg Intramuscular TID PRN Bennett, Christal H, NP       magnesium hydroxide (MILK OF MAGNESIA) suspension 15 mL  15 mL Oral Daily PRN Bennett, Christal H, NP       methocarbamol (ROBAXIN) tablet 500 mg  500 mg Oral TID Charm Rings, NP   500 mg at 06/25/23 1257   OLANZapine (ZYPREXA) tablet 10 mg  10 mg Oral  QHS Sarina Ill, DO   10 mg at 06/24/23 2104   pantoprazole (PROTONIX) EC tablet 40 mg  40 mg Oral Daily Charm Rings, NP   40 mg at 06/25/23 1610   potassium chloride SA (KLOR-CON M) CR tablet 20 mEq  20 mEq Oral QPC breakfast Charm Rings, NP   20 mEq at 06/25/23 9604   risperiDONE (RISPERDAL) tablet 0.5 mg  0.5 mg Oral [id number] Tyeson, Teitel, DO   0.5 mg at 06/25/23 9147   traZODone (DESYREL) tablet 100 mg  100 mg Oral QHS Sarina Ill, DO   100 mg at 06/24/23 2103    Lab Results: No results found for this or any previous visit (from the past 48 hour(s)).  Blood Alcohol level:  Lab Results  Component Value Date   ETH <10 06/13/2023   ETH <10 05/27/2023    Metabolic Disorder Labs: Lab Results  Component Value Date   HGBA1C 5.8 (H) 06/01/2023   MPG 119.76 06/01/2023   MPG 120 02/20/2017   No results found for: "PROLACTIN" Lab Results  Component Value Date   CHOL 124 06/01/2023   TRIG 81 06/01/2023   HDL 40 (L) 06/01/2023   CHOLHDL 3.1 06/01/2023   VLDL 16 06/01/2023   LDLCALC 68 06/01/2023   LDLCALC  04/04/2022     Comment:     . LDL cholesterol not calculated. Triglyceride levels greater than 400 mg/dL invalidate calculated LDL results. . Reference range: <100 . Desirable range <100 mg/dL for primary prevention;   <70 mg/dL for patients with CHD or diabetic patients  with > or = 2 CHD risk factors. Marland Kitchen LDL-C is now calculated using the Martin-Hopkins  calculation, which is a validated novel method providing  better accuracy than the Friedewald equation in the  estimation of LDL-C.  Horald Pollen et al. Lenox Ahr. 8295;621(30): 2061-2068  (http://education.[facility].com/faq/FAQ164)     Physical Findings: AIMS:  , ,  ,  ,    CIWA:    COWS:     Musculoskeletal: Strength & Muscle Tone: within normal limits Gait & Station: normal Patient leans: N/A  Psychiatric Specialty Exam:  Presentation  General Appearance:   Disheveled  Eye Contact: Fair  Speech: Pressured  Speech Volume: Normal  Handedness: Right   Mood and Affect  Mood: Anxious; Irritable  Affect: Restricted   Thought Process  Thought Processes: Disorganized  Descriptions of Associations:Loose  Orientation:Partial  Thought Content:Delusions; Paranoid Ideation; Scattered  History of Schizophrenia/Schizoaffective disorder:No  Duration of Psychotic Symptoms:Less than six months  Hallucinations:No data recorded Ideas of Reference:Paranoia; Delusions  Suicidal Thoughts:No data recorded Homicidal Thoughts:No data recorded  Sensorium  Memory: Immediate Fair; Remote Fair  Judgment: Poor  Insight: Poor   Executive Functions  Concentration: Poor  Attention Span: Fair  Recall: Poor  Fund of Knowledge: Fair  Language: Fair   Psychomotor Activity  Psychomotor Activity:No data recorded  Assets  Assets: Financial Resources/Insurance; Social Support; Housing   Sleep  Sleep:No data recorded    Blood pressure 131/85, pulse 88, temperature 98.1 F (36.7 C), resp. rate 14, height 6\' 1"  (1.854 m), weight 104.6 kg, SpO2 100%. Body mass index is 30.42 kg/m.   Treatment Plan Summary: Daily contact with patient to assess and evaluate symptoms and progress in treatment, Medication management, and Plan continue current medications.  Discharge tomorrow.  Sarina Ill, DO 06/25/2023, 2:16 PM

## 2023-06-26 DIAGNOSIS — F19959 Other psychoactive substance use, unspecified with psychoactive substance-induced psychotic disorder, unspecified: Secondary | ICD-10-CM | POA: Diagnosis not present

## 2023-06-26 MED ORDER — RISPERIDONE 0.5 MG PO TABS: 0.5000 mg | ORAL_TABLET | ORAL | 3 refills | Status: DC

## 2023-06-26 MED ORDER — GABAPENTIN 100 MG PO CAPS: 200.0000 mg | ORAL_CAPSULE | Freq: Three times a day (TID) | ORAL | 0 refills | Status: DC

## 2023-06-26 MED ORDER — LIDOCAINE 5 % EX PTCH: 1.0000 | MEDICATED_PATCH | CUTANEOUS | 0 refills | Status: DC

## 2023-06-26 MED ORDER — TRAZODONE HCL 100 MG PO TABS: 100.0000 mg | ORAL_TABLET | Freq: Every day | ORAL | 3 refills | Status: DC

## 2023-06-26 MED ORDER — ALBUTEROL SULFATE HFA 108 (90 BASE) MCG/ACT IN AERS: 2.0000 | INHALATION_SPRAY | RESPIRATORY_TRACT | 1 refills | Status: DC | PRN

## 2023-06-26 MED ORDER — PANTOPRAZOLE SODIUM 40 MG PO TBEC: 40.0000 mg | DELAYED_RELEASE_TABLET | Freq: Every day | ORAL | 3 refills | Status: DC

## 2023-06-26 MED ORDER — HYDROXYZINE HCL 25 MG PO TABS: 25.0000 mg | ORAL_TABLET | Freq: Three times a day (TID) | ORAL | 0 refills | Status: DC | PRN

## 2023-06-26 MED ORDER — METHOCARBAMOL 500 MG PO TABS: 500.0000 mg | ORAL_TABLET | Freq: Three times a day (TID) | ORAL | 0 refills | Status: DC

## 2023-06-26 MED ORDER — OLANZAPINE 10 MG PO TABS: 10.0000 mg | ORAL_TABLET | Freq: Every day | ORAL | 3 refills | Status: DC

## 2023-06-26 MED ORDER — ENSURE ENLIVE PO LIQD: 237.0000 mL | Freq: Two times a day (BID) | ORAL | 12 refills | Status: DC

## 2023-06-26 MED ORDER — GENVOYA 150-150-200-10 MG PO TABS: 1.0000 | ORAL_TABLET | Freq: Every day | ORAL | 3 refills | Status: DC

## 2023-06-26 MED ORDER — DULOXETINE HCL 60 MG PO CPEP: 60.0000 mg | ORAL_CAPSULE | Freq: Every day | ORAL | 3 refills | Status: DC

## 2023-06-26 MED ORDER — APIXABAN 2.5 MG PO TABS: 2.5000 mg | ORAL_TABLET | Freq: Two times a day (BID) | ORAL | 0 refills | Status: DC

## 2023-06-26 MED ORDER — LEVOFLOXACIN 750 MG PO TABS: 750.0000 mg | ORAL_TABLET | Freq: Every day | ORAL | 0 refills | Status: AC

## 2023-06-26 NOTE — Group Note (Signed)
Date:  06/26/2023 Time:  11:26 AM  Group Topic/Focus:  Outside Rec/Music Therapy  The purpose of this group is to get patients outside and get fresh air while listening to therapeutic music.   Participation Level:  Did Not Attend  Participation Quality:    Affect:    Cognitive:    Insight:   Engagement in Group:    Modes of Intervention:    Additional Comments:  Did not attend   Eldine Rencher T Brando Taves 06/26/2023, 11:26 AM

## 2023-06-26 NOTE — Progress Notes (Signed)
  [facility] Adult Case Management Discharge Plan :  Will you be returning to the same living situation after discharge:  Yes,  pt will be returning home  At discharge, do you have transportation home?: Yes,  CSW will provide taxi voucher  Do you have the ability to pay for your medications: Yes,  [employer] MEDICARE / Suan Halter DUAL COMPLETE  Release of information consent forms completed and in the chart;  Patient's signature needed at discharge.  Patient to Follow up at:  Follow-up Information     Services, [facility]. Go to.   Why: Your appointment is scheduled for 06/30/23 at 9:00 AM. Please remember to bring your insurance card. Contact information: [address] [place] [address] [phone]                 Next level of care provider has access to [facility] Link:no  Safety Planning and Suicide Prevention discussed: Valentino Hue  Genia Del, daughter [phone]     Has patient been referred to the Quitline?: Patient refused referral for treatment  Patient has been referred for addiction treatment: Yes, the patient will follow up with an outpatient provider for substance use disorder. Psychiatrist/APP: appointment made at [facility], LCSWA 06/26/2023, 10:10 AM

## 2023-06-26 NOTE — Discharge Summary (Signed)
Physician Discharge Summary Note  Patient:  Devin [name] is an 62 y.o., male MRN:  [id number] DOB:  07-Mar-1961 Patient phone:  [phone] (home)  Patient address:   [address] [place] [address],  Total Time spent with patient: 1 hour  Date of Admission:  06/17/2023 Date of Discharge: 06/26/2023  Reason for Admission:  62 yo male presented with methamphetamine induced psychosis with paranoia and auditory and visual hallucinations. On assessment, he denied hallucinations and paranoia. He does complain of back pain that is a chronic issue for him, visibly in pain on assessment, medications ordered to assist; disability in place r/t back issues. Depression is moderate with no suicidal ideations. Anxiety is high, "I'm worried to death about my home and bills". No panic attacks or trauma. His sleep at home is "pretty good" and appetite are "good". He started using meth "right before I turned 62. I don't use every day, here or there" via smoking. Daily use of nicotine via cigarettes. Denies mania symptoms, homicidal ideations, and withdrawal symptoms.   Principal Problem: Psychoactive substance-induced psychosis [facility]) Discharge Diagnoses: Principal Problem:   Psychoactive substance-induced psychosis (HCC)   Past Psychiatric History: This is his third hospitalization for delusional disorder, depression and chronic pain.  Past Medical History:  Past Medical History:  Diagnosis Date   Anxiety    Arthritis    "I'm eat up w/it" (02/20/2017)   Asthma    Chronic back pain    "the whole back" (02/20/2017)   Chronic bronchitis (HCC)    Complication of anesthesia    "felt like I couldn't breath coming out of it"   COPD (chronic obstructive pulmonary disease) (HCC)    Depression    GERD (gastroesophageal reflux disease)    History of hiatal hernia    History of stomach ulcers    "bleeding ones; I was young then"   HIV infection (HCC) dx'd ~ 1999   Hypertension    Pneumonia     "several times" (02/20/2017)   Prolapsed disk 10/28   Pulmonary embolism (HCC) 02/20/2017   Scoliosis 08/24/13    Past Surgical History:  Procedure Laterality Date   BACK SURGERY  2019   KNEE ARTHROSCOPY Right 1980s   Family History:  Family History  Problem Relation Age of Onset   Diabetes Father    Stroke Other    Colon cancer Neg Hx    Family Psychiatric  History: Unremarkable Social History:  Social History   Substance and Sexual Activity  Alcohol Use Not Currently   Comment: last used about 9 years ago     Social History   Substance and Sexual Activity  Drug Use Yes   Types: Marijuana   Comment: denies    Social History   Socioeconomic History   Marital status: Divorced    Spouse name: Not on file   Number of children: Not on file   Years of education: Not on file   Highest education level: Not on file  Occupational History   Not on file  Tobacco Use   Smoking status: Every Day    Current packs/day: 1.00    Average packs/day: 1 pack/day for 48.0 years (48.0 ttl pk-yrs)    Types: Cigarettes   Smokeless tobacco: Never  Vaping Use   Vaping status: Never Used  Substance and Sexual Activity   Alcohol use: Not Currently    Comment: last used about 9 years ago   Drug use: Yes    Types: Marijuana  Comment: denies   Sexual activity: Yes    Partners: Female    Comment: declined condoms  Other Topics Concern   Not on file  Social History Narrative   Not on file   Social Determinants of Health   Financial Resource Strain: Not on file  Food Insecurity: Food Insecurity Present (06/17/2023)   Hunger Vital Sign    Worried About Running Out of Food in the Last Year: Sometimes true    Ran Out of Food in the Last Year: Never true  Transportation Needs: No Transportation Needs (06/17/2023)   PRAPARE - Administrator, Civil Service (Medical): No    Lack of Transportation (Non-Medical): No  Recent Concern: Transportation Needs - Unmet Transportation  Needs (05/28/2023)   PRAPARE - Administrator, Civil Service (Medical): Yes    Lack of Transportation (Non-Medical): Yes  Physical Activity: Not on file  Stress: Not on file  Social Connections: Not on file    Hospital Course: Dearl was involuntarily admitted to inpatient psychiatry after being committed after an altercation with police who were called by his neighbor.  He had been using methamphetamine and apparently had been psychotic.  He was placed back on medications which included Zyprexa, Risperdal and Neurontin and Robaxin for chronic pain along with Lidoderm patch.  He had been using methamphetamine and was offered rehab but states that he did use it very much and he did not need rehab.  He developed a cough and some pneumonia and also COVID and was placed on Levaquin and his shortness of breath did improve.  He denied being suicidal or homicidal and his delusions subsided and it was felt that he maximized hospitalization he was discharged home.  He did agree to follow-up at [facility] in [place].  On the day of discharge she denied suicidal ideation, homicidal ideation, auditory or visual hallucinations.  His judgment and insight were good.  Physical Findings: AIMS:  , ,  ,  ,    CIWA:    COWS:     Musculoskeletal: Strength & Muscle Tone: within normal limits Gait & Station: normal Patient leans: N/A   Psychiatric Specialty Exam:  Presentation  General Appearance:  Disheveled  Eye Contact: Fair  Speech: Pressured  Speech Volume: Normal  Handedness: Right   Mood and Affect  Mood: Anxious; Irritable  Affect: Restricted   Thought Process  Thought Processes: Disorganized  Descriptions of Associations:Loose  Orientation:Partial  Thought Content:Delusions; Paranoid Ideation; Scattered  History of Schizophrenia/Schizoaffective disorder:No  Duration of Psychotic Symptoms:Less than six months  Hallucinations:No data recorded Ideas of  Reference:Paranoia; Delusions  Suicidal Thoughts:No data recorded Homicidal Thoughts:No data recorded  Sensorium  Memory: Immediate Fair; Remote Fair  Judgment: Poor  Insight: Poor   Executive Functions  Concentration: Poor  Attention Span: Fair  Recall: Poor  Fund of Knowledge: Fair  Language: Fair   Psychomotor Activity  Psychomotor Activity:No data recorded  Assets  Assets: Financial Resources/Insurance; Social Support; Housing   Sleep  Sleep:No data recorded   Physical Exam: Physical Exam Vitals and nursing note reviewed.  Constitutional:      Appearance: Normal appearance. He is normal weight.  Neurological:     General: No focal deficit present.     Mental Status: He is alert and oriented to person, place, and time.  Psychiatric:        Attention and Perception: Attention and perception normal.        Mood and Affect: Mood and affect normal.  Speech: Speech normal.        Behavior: Behavior normal. Behavior is cooperative.        Thought Content: Thought content normal.        Cognition and Memory: Cognition and memory normal.        Judgment: Judgment normal.    Review of Systems  Constitutional: Negative.   HENT: Negative.    Eyes: Negative.   Respiratory: Negative.    Cardiovascular: Negative.   Gastrointestinal: Negative.   Genitourinary: Negative.   Musculoskeletal: Negative.   Skin: Negative.   Neurological: Negative.   Endo/Heme/Allergies: Negative.   Psychiatric/Behavioral: Negative.     Blood pressure 93/65, pulse 88, temperature (!) 97.4 F (36.3 C), resp. rate 18, height 6\' 1"  (1.854 m), weight 104.6 kg, SpO2 98%. Body mass index is 30.42 kg/m.   Social History   Tobacco Use  Smoking Status Every Day   Current packs/day: 1.00   Average packs/day: 1 pack/day for 48.0 years (48.0 ttl pk-yrs)   Types: Cigarettes  Smokeless Tobacco Never   Tobacco Cessation:  A prescription for an FDA-approved tobacco  cessation medication was offered at discharge and the patient refused   Blood Alcohol level:  Lab Results  Component Value Date   [facility] <10 06/13/2023   ETH <10 05/27/2023    Metabolic Disorder Labs:  Lab Results  Component Value Date   HGBA1C 5.8 (H) 06/01/2023   MPG 119.76 06/01/2023   MPG 120 02/20/2017   No results found for: "PROLACTIN" Lab Results  Component Value Date   CHOL 124 06/01/2023   TRIG 81 06/01/2023   HDL 40 (L) 06/01/2023   CHOLHDL 3.1 06/01/2023   VLDL 16 06/01/2023   LDLCALC 68 06/01/2023   LDLCALC  04/04/2022     Comment:     . LDL cholesterol not calculated. Triglyceride levels greater than 400 mg/dL invalidate calculated LDL results. . Reference range: <100 . Desirable range <100 mg/dL for primary prevention;   <70 mg/dL for patients with CHD or diabetic patients  with > or = 2 CHD risk factors. Marland Kitchen LDL-C is now calculated using the Martin-Hopkins  calculation, which is a validated novel method providing  better accuracy than the Friedewald equation in the  estimation of LDL-C.  Horald Pollen et al. Lenox Ahr. 1610;960(45): 2061-2068  (http://education.[employer].com/faq/FAQ164)     See Psychiatric Specialty Exam and Suicide Risk Assessment completed by Attending Physician prior to discharge.  Discharge destination:  Home  Is patient on multiple antipsychotic therapies at discharge:  No   Has Patient had three or more failed trials of antipsychotic monotherapy by history:  No  Recommended Plan for Multiple Antipsychotic Therapies: Additional reason(s) for multiple antispychotic treatment:  Depression and psychosis.   Allergies as of 06/26/2023   No Known Allergies      Medication List     STOP taking these medications    amLODipine 5 MG tablet Commonly known as: NORVASC   beclomethasone 80 MCG/ACT inhaler Commonly known as: QVAR   Combivent Respimat 20-100 MCG/ACT Aers respimat Generic drug: Ipratropium-Albuterol    cyclobenzaprine 10 MG tablet Commonly known as: FLEXERIL   diazepam 10 MG tablet Commonly known as: VALIUM   FLUoxetine 20 MG capsule Commonly known as: PROZAC   furosemide 40 MG tablet Commonly known as: LASIX   potassium chloride SA 20 MEQ tablet Commonly known as: KLOR-CON M       TAKE these medications      Indication  albuterol 108 (90 Base) MCG/ACT inhaler  Commonly known as: VENTOLIN HFA Inhale 2 puffs into the lungs every 4 (four) hours as needed for wheezing or shortness of breath.  Indication: Spasm of Lung Air Passages, Chronic Obstructive Lung Disease   apixaban 2.5 MG Tabs tablet Commonly known as: ELIQUIS Take 1 tablet (2.5 mg total) by mouth 2 (two) times daily.  Indication: Congestive Cardiomyopathy   atorvastatin 10 MG tablet Commonly known as: LIPITOR Take 1 tablet (10 mg total) by mouth daily.    docusate sodium 100 MG capsule Commonly known as: COLACE Take 1 capsule (100 mg total) by mouth 2 (two) times daily.    DULoxetine 60 MG capsule Commonly known as: CYMBALTA Take 1 capsule (60 mg total) by mouth daily. Start taking on: June 27, 2023  Indication: Nature Depressive Disorder   feeding supplement Liqd Take 237 mLs by mouth 2 (two) times daily between meals.    gabapentin 100 MG capsule Commonly known as: NEURONTIN Take 2 capsules (200 mg total) by mouth 3 (three) times daily.  Indication: Generalized Anxiety Disorder, Peripheral Nerve Disease, Spinal Cord Injury   Genvoya 150-150-200-10 MG Tabs tablet Generic drug: elvitegravir-cobicistat-emtricitabine-tenofovir Take 1 tablet by mouth daily with breakfast. Start taking on: June 27, 2023  Indication: HIV Disease   hydrOXYzine 25 MG tablet Commonly known as: ATARAX Take 1 tablet (25 mg total) by mouth 3 (three) times daily as needed for anxiety.    levofloxacin 750 MG tablet Commonly known as: LEVAQUIN Take 1 tablet (750 mg total) by mouth daily after breakfast for 4  days. Start taking on: June 27, 2023  Indication: Acute Episode of Chronic Bronchitis   lidocaine 5 % Commonly known as: LIDODERM Place 1 patch onto the skin daily. Remove & Discard patch within 12 hours or as directed by MD    methocarbamol 500 MG tablet Commonly known as: ROBAXIN Take 1 tablet (500 mg total) by mouth 3 (three) times daily.  Indication: Musculoskeletal Pain   OLANZapine 10 MG tablet Commonly known as: ZYPREXA Take 1 tablet (10 mg total) by mouth at bedtime.  Indication: Psychotic Depressive Illness   pantoprazole 40 MG tablet Commonly known as: PROTONIX Take 1 tablet (40 mg total) by mouth daily.  Indication: Gastroesophageal Reflux Disease   risperiDONE 0.5 MG tablet Commonly known as: RISPERDAL Take 1 tablet (0.5 mg total) by mouth 2 (two) times daily at 8 am and 4 pm. What changed: Another medication with the same name was removed. Continue taking this medication, and follow the directions you see here.  Indication: Ronnell Depressive Disorder   traZODone 100 MG tablet Commonly known as: DESYREL Take 1 tablet (100 mg total) by mouth at bedtime.  Indication: Trouble Sleeping, Montrez Depressive Disorder        Follow-up Information     Services, [facility]. Go to.   Why: Your appointment is scheduled for 06/30/23 at 9:00 AM. Please remember to bring your insurance card. Contact information: [address] [place] [address] [phone]                 Follow-up recommendations:  [facility] in [place]    Signed: Macin Czarnecki, DO 06/26/2023, 9:50 AM

## 2023-06-26 NOTE — Plan of Care (Signed)
Patient discharged home - medications called into pharmacy. Paperwork provided and belongings gone through. Patient to exit via wheelchair with mask d/t positive covid. Taxi voucher provided.

## 2023-06-26 NOTE — BHH Counselor (Signed)
Pt gave verbal consent for taxi ride. CSW unable to go into contact precaution room.

## 2023-06-26 NOTE — BHH Suicide Risk Assessment (Signed)
[facility] Discharge Suicide Risk Assessment   Principal Problem: Psychoactive substance-induced psychosis (HCC) Discharge Diagnoses: Principal Problem:   Psychoactive substance-induced psychosis (HCC)   Total Time spent with patient: 1 hour  Musculoskeletal: Strength & Muscle Tone: within normal limits Gait & Station: normal Patient leans: N/A  Psychiatric Specialty Exam  Presentation  General Appearance:  Disheveled  Eye Contact: Fair  Speech: Pressured  Speech Volume: Normal  Handedness: Right   Mood and Affect  Mood: Anxious; Irritable  Duration of Depression Symptoms: Greater than two weeks  Affect: Restricted   Thought Process  Thought Processes: Disorganized  Descriptions of Associations:Loose  Orientation:Partial  Thought Content:Delusions; Paranoid Ideation; Scattered  History of Schizophrenia/Schizoaffective disorder:No  Duration of Psychotic Symptoms:Less than six months  Hallucinations:No data recorded Ideas of Reference:Paranoia; Delusions  Suicidal Thoughts:No data recorded Homicidal Thoughts:No data recorded  Sensorium  Memory: Immediate Fair; Remote Fair  Judgment: Poor  Insight: Poor   Executive Functions  Concentration: Poor  Attention Span: Fair  Recall: Poor  Fund of Knowledge: Fair  Language: Fair   Psychomotor Activity  Psychomotor Activity:No data recorded  Assets  Assets: Financial Resources/Insurance; Social Support; Housing   Sleep  Sleep:No data recorded   Blood pressure 93/65, pulse 88, temperature (!) 97.4 F (36.3 C), resp. rate 18, height 6\' 1"  (1.854 m), weight 104.6 kg, SpO2 98%. Body mass index is 30.42 kg/m.  Mental Status Per Nursing Assessment::   On Admission:  NA  Demographic Factors:  Male, Living alone, and Access to firearms  Loss Factors: NA  Historical Factors: NA  Risk Reduction Factors:   NA  Cognitive Features That Contribute To Risk:  None    Suicide  Risk:  Minimal: No identifiable suicidal ideation.  Patients presenting with no risk factors but with morbid ruminations; may be classified as minimal risk based on the severity of the depressive symptoms   Follow-up Information     [facility], [facility]. Go to.   Why: Your appointment is scheduled for 06/30/23 at 9:00 AM. Please remember to bring your insurance card. Contact information: [address] [place] [address] [phone]                 Plan Of Care/Follow-up recommendations: Blayk Riley, DO 06/26/2023, 9:31 AM
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
## 2023-06-26 NOTE — BHH Suicide Risk Assessment (Signed)
[facility] INPATIENT:  Family/Significant Other Suicide Prevention Education  Suicide Prevention Education:  Education Completed; Genia Del daughter, [phone]), has been identified by the patient as the family member/significant other with whom the patient will be residing, and identified as the person(s) who will aid the patient in the event of a mental health crisis (suicidal ideations/suicide attempt).  With written consent from the patient, the family member/significant other has been provided the following suicide prevention education, prior to the and/or following the discharge of the patient.  The suicide prevention education provided includes the following: Suicide risk factors Suicide prevention and interventions National Suicide Hotline telephone number [facility] assessment telephone number [facility] Emergency Assistance 911 [facility] and/or Residential Mobile Crisis Unit telephone number  Request made of family/significant other to: Remove weapons (e.g., guns, rifles, knives), all items previously/currently identified as safety concern.   Remove drugs/medications (over-the-counter, prescriptions, illicit drugs), all items previously/currently identified as a safety concern.  The family member/significant other verbalizes understanding of the suicide prevention education information provided.  The family member/significant other agrees to remove the items of safety concern listed above.  Elza Rafter 06/26/2023, 9:38 AM

## 2023-07-03 ENCOUNTER — Other Ambulatory Visit (HOSPITAL_COMMUNITY): Payer: Self-pay

## 2023-07-07 ENCOUNTER — Other Ambulatory Visit (HOSPITAL_COMMUNITY): Payer: Self-pay

## 2023-07-07 ENCOUNTER — Other Ambulatory Visit: Payer: Self-pay | Admitting: Infectious Diseases

## 2023-07-07 DIAGNOSIS — B20 Human immunodeficiency virus [HIV] disease: Secondary | ICD-10-CM

## 2023-07-09 ENCOUNTER — Other Ambulatory Visit: Payer: Self-pay | Admitting: Infectious Diseases

## 2023-07-09 ENCOUNTER — Other Ambulatory Visit (HOSPITAL_COMMUNITY): Payer: Self-pay

## 2023-07-09 ENCOUNTER — Other Ambulatory Visit: Payer: Self-pay

## 2023-07-09 DIAGNOSIS — B20 Human immunodeficiency virus [HIV] disease: Secondary | ICD-10-CM

## 2023-07-09 MED ORDER — GENVOYA 150-150-200-10 MG PO TABS
1.0000 | ORAL_TABLET | Freq: Every day | ORAL | 0 refills | Status: AC
Start: 2023-07-09 — End: 2023-08-09
  Filled 2023-07-09: qty 30, 30d supply, fill #0

## 2023-07-09 NOTE — Telephone Encounter (Signed)
I will refill 30 days, please make an appointment for this person with Dr. Ninetta Lights within that time. Thank you.

## 2023-07-19 ENCOUNTER — Encounter (HOSPITAL_COMMUNITY): Payer: Self-pay

## 2023-08-01 ENCOUNTER — Other Ambulatory Visit: Payer: Self-pay

## 2023-08-04 ENCOUNTER — Other Ambulatory Visit (HOSPITAL_COMMUNITY): Payer: Self-pay

## 2023-08-06 ENCOUNTER — Other Ambulatory Visit: Payer: Self-pay

## 2023-08-06 ENCOUNTER — Other Ambulatory Visit: Payer: Self-pay | Admitting: Student in an Organized Health Care Education/Training Program

## 2023-08-06 DIAGNOSIS — B20 Human immunodeficiency virus [HIV] disease: Secondary | ICD-10-CM

## 2023-08-06 NOTE — Telephone Encounter (Signed)
Next appt scheduled 10/15 with Dr Ninetta Lights.

## 2023-08-06 NOTE — Progress Notes (Signed)
Specialty Pharmacy Refill Coordination Note  Devin [name] is a 61 y.o. male contacted today regarding refills of specialty medication(s) Elviteg-Cobic-Emtricit-Tenofaf   Patient requested Delivery   Delivery date: 08/15/23   Verified address: [address] [place], [place], [address]   Medication will be filled on 08/14/2023.

## 2023-08-07 ENCOUNTER — Other Ambulatory Visit: Payer: Self-pay

## 2023-08-07 ENCOUNTER — Other Ambulatory Visit: Payer: Self-pay | Admitting: Student in an Organized Health Care Education/Training Program

## 2023-08-07 DIAGNOSIS — B20 Human immunodeficiency virus [HIV] disease: Secondary | ICD-10-CM

## 2023-08-08 ENCOUNTER — Other Ambulatory Visit: Payer: Self-pay | Admitting: Infectious Diseases

## 2023-08-08 ENCOUNTER — Other Ambulatory Visit: Payer: Self-pay

## 2023-08-08 DIAGNOSIS — B20 Human immunodeficiency virus [HIV] disease: Secondary | ICD-10-CM

## 2023-08-11 ENCOUNTER — Other Ambulatory Visit: Payer: Self-pay | Admitting: Infectious Diseases

## 2023-08-11 MED ORDER — GENVOYA 150-150-200-10 MG PO TABS: 1.0000 | ORAL_TABLET | Freq: Every day | ORAL | 3 refills | Status: DC

## 2023-08-12 ENCOUNTER — Encounter: Payer: 59 | Admitting: Infectious Diseases

## 2023-08-13 ENCOUNTER — Other Ambulatory Visit: Payer: Self-pay

## 2023-08-13 ENCOUNTER — Other Ambulatory Visit: Payer: Self-pay | Admitting: Student in an Organized Health Care Education/Training Program

## 2023-08-13 DIAGNOSIS — B20 Human immunodeficiency virus [HIV] disease: Secondary | ICD-10-CM

## 2023-08-14 ENCOUNTER — Other Ambulatory Visit: Payer: Self-pay

## 2023-08-14 ENCOUNTER — Telehealth: Payer: Self-pay

## 2023-08-14 ENCOUNTER — Other Ambulatory Visit (HOSPITAL_COMMUNITY): Payer: Self-pay

## 2023-08-14 NOTE — Telephone Encounter (Signed)
Rec'd message from central fill pharmacy regarding patients refill/shipment.   Pt's recent rx was approved and sent to [facility] pharmacy. Central fill pharmacy wants to know was this sent in error? Or was patient now picking up medication there. If sent in error, could rx be resent to our pharmacy ([place]) to be shipped to patient?

## 2023-08-15 ENCOUNTER — Other Ambulatory Visit: Payer: Self-pay

## 2023-08-15 ENCOUNTER — Other Ambulatory Visit (HOSPITAL_COMMUNITY): Payer: Self-pay

## 2023-08-15 NOTE — Progress Notes (Signed)
Refills denied. Medication was sent to [facility], perhaps in error. Reached out to [place], who messaged the office to confirm that patient wants to get from other pharmacy. I called patient and left a voice mail for him to call back. Should he call, please ascertain which pharmacy he would like to fill with.

## 2023-08-18 ENCOUNTER — Other Ambulatory Visit: Payer: Self-pay

## 2023-08-18 ENCOUNTER — Other Ambulatory Visit (HOSPITAL_COMMUNITY): Payer: Self-pay

## 2023-08-21 ENCOUNTER — Other Ambulatory Visit (HOSPITAL_COMMUNITY): Payer: Self-pay

## 2023-08-21 ENCOUNTER — Other Ambulatory Visit: Payer: Self-pay

## 2023-08-21 ENCOUNTER — Other Ambulatory Visit: Payer: Self-pay | Admitting: Infectious Diseases

## 2023-08-21 DIAGNOSIS — B20 Human immunodeficiency virus [HIV] disease: Secondary | ICD-10-CM

## 2023-08-21 MED ORDER — GENVOYA 150-150-200-10 MG PO TABS
1.0000 | ORAL_TABLET | Freq: Every day | ORAL | 3 refills | Status: DC
Start: 2023-08-21 — End: 2023-12-26
  Filled 2023-08-21: qty 30, 30d supply, fill #0

## 2023-08-21 NOTE — Progress Notes (Signed)
Ship 10/25 for 10/28. Prescription has been sent in. Called patient but Voicemail was full.

## 2023-08-25 ENCOUNTER — Other Ambulatory Visit: Payer: Self-pay

## 2023-08-28 ENCOUNTER — Other Ambulatory Visit (HOSPITAL_COMMUNITY): Payer: Self-pay

## 2023-09-02 ENCOUNTER — Encounter: Payer: 59 | Admitting: Infectious Diseases

## 2023-09-09 ENCOUNTER — Other Ambulatory Visit (HOSPITAL_COMMUNITY): Payer: Self-pay

## 2023-09-17 ENCOUNTER — Other Ambulatory Visit: Payer: Self-pay

## 2023-10-07 ENCOUNTER — Encounter: Payer: 59 | Admitting: Infectious Diseases

## 2023-12-04 ENCOUNTER — Other Ambulatory Visit: Payer: Self-pay

## 2023-12-04 NOTE — Progress Notes (Signed)
 Disenrolling - multiple call attempts since 10.31.25 and unable to reach patient. Medication last filled 10.25.25. Tried to call today, unable to leave message since patient voicemail is full.

## 2023-12-10 ENCOUNTER — Telehealth: Payer: Self-pay | Admitting: *Deleted

## 2023-12-10 NOTE — Telephone Encounter (Signed)
Call from patient has been discharged from a previous practice.  States was taken off his pain meds with out an explanation. Patient was informed that with out his being seen the doctors will be unable to order pain meds.  Patient also has not had any of his meds for 8 months to a year. States has a history of blood clots and things they may be starting again.  Patient currently has not been seen in the Clinics for 3 years. Patient was informed that the doctor's here would have to see him first before medications can be refilled.  Advised to go to the ER or Urgent care for assessment of his leg pain.  Patient stated that he did not have the money to go to the ER.  Patient was informed that he has 2 types of insurance and that  should cover an ER visit.   Patient  stated that he wants to wait s little while to see what the leg pain does and if he feels that he may be getting the clots again.

## 2023-12-24 ENCOUNTER — Encounter: Payer: 59 | Admitting: Infectious Diseases

## 2023-12-25 ENCOUNTER — Encounter: Payer: 59 | Admitting: Infectious Diseases

## 2023-12-26 ENCOUNTER — Telehealth: Payer: Self-pay | Admitting: Infectious Diseases

## 2023-12-26 ENCOUNTER — Encounter: Payer: 59 | Admitting: Infectious Diseases

## 2023-12-26 ENCOUNTER — Ambulatory Visit: Payer: 59 | Admitting: Infectious Diseases

## 2023-12-26 DIAGNOSIS — I2699 Other pulmonary embolism without acute cor pulmonale: Secondary | ICD-10-CM | POA: Diagnosis not present

## 2023-12-26 DIAGNOSIS — Z79899 Other long term (current) drug therapy: Secondary | ICD-10-CM

## 2023-12-26 DIAGNOSIS — B20 Human immunodeficiency virus [HIV] disease: Secondary | ICD-10-CM | POA: Diagnosis not present

## 2023-12-26 MED ORDER — GENVOYA 150-150-200-10 MG PO TABS: 1.0000 | ORAL_TABLET | Freq: Every day | ORAL | 3 refills | Status: DC

## 2023-12-26 MED ORDER — APIXABAN 2.5 MG PO TABS: 2.5000 mg | ORAL_TABLET | Freq: Two times a day (BID) | ORAL | 0 refills | Status: DC

## 2023-12-26 MED ORDER — HYDROXYZINE HCL 25 MG PO TABS
25.0000 mg | ORAL_TABLET | Freq: Three times a day (TID) | ORAL | 0 refills | Status: DC | PRN
Start: 2023-12-26 — End: 2024-03-11

## 2023-12-26 MED ORDER — OLANZAPINE 10 MG PO TABS: 10.0000 mg | ORAL_TABLET | Freq: Every day | ORAL | 3 refills | Status: DC

## 2023-12-26 MED ORDER — ATORVASTATIN CALCIUM 10 MG PO TABS
10.0000 mg | ORAL_TABLET | Freq: Every day | ORAL | 0 refills | Status: DC
Start: 2023-12-26 — End: 2024-03-11

## 2023-12-26 MED ORDER — DULOXETINE HCL 60 MG PO CPEP
60.0000 mg | ORAL_CAPSULE | Freq: Every day | ORAL | 3 refills | Status: DC
Start: 2023-12-26 — End: 2024-03-11

## 2023-12-26 NOTE — Assessment & Plan Note (Signed)
 Will review his apixiban with pharm Refilled.

## 2023-12-26 NOTE — Progress Notes (Signed)
   Subjective:    Patient ID: Devin [name], male  DOB: June 05, 1961, 63 y.o.        MRN: [id number]   HPI  63 yo M with HIV+ dx 1995, depression, and hx of COPD. Maintained on genvoya (prev took combivir/efavirenz). He had back surgery- L5-S1 fusion ("artificial disks, bone grafts"). 10-2017.   Has been "rough" lately.  He has broken up with his partner (she was unfaithful).  He has had multiple visits to ED for psychosis, hallucinations. As well as for a blood clot. Was placed on blood thinner but has run out.  He is also off all his other meds. He was d/c by his PCP (accused him of selling his pills, but did not test him).  C/o R arm cramp and pain. Has to limit his use of the arm when this happens.   HIV 1 RNA Quant (Copies/mL)  Date Value  04/04/2022 Not Detected  05/08/2021 Not Detected  07/12/2020 <20   CD4 T Cell Abs (/uL)  Date Value  04/04/2022 626  05/08/2021 860  07/12/2020 708     Health Maintenance  Topic Date Due   Medicare Annual Wellness (AWV)  Never done   Zoster Vaccines- Shingrix (1 of 2) Never done   Colonoscopy  Never done   COVID-19 Vaccine (3 - Moderna risk series) 07/05/2020   Lung Cancer Screening  04/25/2023   DTaP/Tdap/Td (2 - Td or Tdap) 08/01/2023   Pneumococcal Vaccine 39-76 Years old (4 of 4 - PPSV23 or PCV20) 05/13/2028   Hepatitis C Screening  Completed   HIV Screening  Completed   HPV VACCINES  Aged Out      ROS  Please see HPI. All other systems reviewed and negative.     Objective:  Physical Exam  1. Due to the national emergency this service was provided using telemedicine. phone.  2. Consent from the patient for the telehealth visit and that you identified patient DOB, name  3. Your locations, Pt and Provider IMTS, home  4. Chief complaint for visit follow up  5. Document anyone else on the call none25  6. If the visit was a phone call, that you include the time you spent on the call: 25 minutes.        Assessment &  Plan:
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
## 2023-12-26 NOTE — Telephone Encounter (Signed)
 Called pt and told him to stop abixaban after discussing with pharmacy.  He was rec to stop in 2018 by heme at [facility].  I have called his pharm to stop his eliquis/abixiban.   He believes he had another blood clot 2019 but there is no evidence of this in the computer (he states he only uses [facility]) He c/o leg swelling now, I advised him to go to ED for u/s.

## 2024-01-02 ENCOUNTER — Telehealth: Payer: Self-pay | Admitting: Internal Medicine

## 2024-01-02 NOTE — Telephone Encounter (Signed)
 Patient called after hours line. He is a patient of Dr. Moshe Cipro.   He is concerned about being taken off of eliquis. He would like to talk with Dr. Ninetta Lights about this.  On chart review, tele note indicates Dr. Ninetta Lights talked with pharmacy with recommendation to stop eliquis.  He has left lower extremity swelling. This has been present since lat Feb. Dr. Ninetta Lights advised him to go to ED at that time.  P:  Will forward message to Dr. Ninetta Lights Patient advised to go to ED if swelling worsens.

## 2024-01-06 ENCOUNTER — Encounter: Admitting: Infectious Diseases

## 2024-01-19 ENCOUNTER — Ambulatory Visit: Payer: Self-pay
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
## 2024-02-11 ENCOUNTER — Ambulatory Visit: Payer: Self-pay

## 2024-02-11 ENCOUNTER — Telehealth: Payer: Self-pay

## 2024-02-11 NOTE — Telephone Encounter (Signed)
 Call  from E2C2   came through RN was  nervous  due to pt called in stating he could not breath and he hadn't have his inhaler  for a over a year .Devin [name] She suggested going to the ED  and that she would  also  send an EMS to pt  .Devin [name] She stated that pt then told her his leg was swollen and hurting  she offered again  to go to the ED , she stated pt then said he will out with out the inhaler and wait till he dies and hang up on her  and refused to answer when she tried to call him back ... I explained pt has a bunch of no showed appt and per message in the chart  from Dr Alwin Baars  he is to come in and he will not fill any other meds for pain ... I also  tried to reach pt to set up and over due f/u with Dr Alwin Baars but he number on file is  stating  call can't be completed as  dialed

## 2024-02-11 NOTE — Telephone Encounter (Signed)
 Chief Complaint: breathing difficulty, albuterol refill Symptoms: shortness of breath, leg edema, shoulder pain, rash to foot Frequency: constant today Pertinent Negatives: Patient denies unable to assess Disposition: [x] ED /[] Urgent Care (no appt availability in office) / [] Appointment(In office/virtual)/ []  [facility]/ [] Home Care/ [x] Refused Recommended Disposition /[] [facility]/ []  Follow-up with PCP Additional Notes:  Out of inhalers for one year. Short of breath, no cough or illness. Left leg is swollen and has a rash on this leg, foot is red.  Complains of right shoulder cramping. States he is supposed to be on blood thinner but not taking it. History of DVT bilateral legs. History of broken ribs on right side. Advised emergency room and patient is refusing. Patient is frustrated states he just wants his meds and if he can't get them he will just go on without them until he dies. Patient then disconnected from this Clinical research associate. This writer attempted to reach back out to patient but unsuccessful. This Clinical research associate spoke with Jenifer Miu of record shows patient continues to cancels appointments, note from Dr. Alwin Baars he is not filling meds due to non compliance with follow up appointments. Patient is aware non compliance with follow up is the reason for not receiving refill. Rexene Catching will attempt to reach patient.    Copied from CRM [id number]. Topic: Clinical - Red Word Triage >> Feb 11, 2024  9:37 AM Danelle Dunning F wrote: [facility] that prompted transfer to Nurse Triage: can't breathe; Reason for Disposition  [1] MODERATE difficulty breathing (e.g., speaks in phrases, SOB even at rest, pulse 100-120) AND [2] NEW-onset or WORSE than normal  Protocols used: Breathing Difficulty-A-AH

## 2024-02-12 ENCOUNTER — Other Ambulatory Visit: Payer: Self-pay | Admitting: Infectious Diseases

## 2024-02-12 ENCOUNTER — Encounter: Payer: Self-pay | Admitting: *Deleted

## 2024-02-12 DIAGNOSIS — J4489 Other specified chronic obstructive pulmonary disease: Secondary | ICD-10-CM

## 2024-02-12 MED ORDER — ALBUTEROL SULFATE HFA 108 (90 BASE) MCG/ACT IN AERS: 2.0000 | INHALATION_SPRAY | RESPIRATORY_TRACT | 1 refills | Status: DC | PRN

## 2024-02-12 NOTE — Telephone Encounter (Signed)
 Called pt - "Call cannot be completed". I will send pt a My Chart message.

## 2024-03-02 DIAGNOSIS — R609 Edema, unspecified: Secondary | ICD-10-CM | POA: Diagnosis not present

## 2024-03-02 DIAGNOSIS — I10 Essential (primary) hypertension: Secondary | ICD-10-CM | POA: Diagnosis not present

## 2024-03-04 ENCOUNTER — Other Ambulatory Visit: Payer: Self-pay

## 2024-03-04 ENCOUNTER — Emergency Department (HOSPITAL_COMMUNITY)

## 2024-03-04 ENCOUNTER — Inpatient Hospital Stay (HOSPITAL_COMMUNITY)
Admission: EM | Admit: 2024-03-04 | Discharge: 2024-03-11 | DRG: 286 | Disposition: A | Discharge status: Home / Self Care | Attending: Internal Medicine | Admitting: Internal Medicine

## 2024-03-04 ENCOUNTER — Encounter (HOSPITAL_COMMUNITY): Payer: Self-pay

## 2024-03-04 DIAGNOSIS — J4489 Other specified chronic obstructive pulmonary disease: Secondary | ICD-10-CM | POA: Diagnosis present

## 2024-03-04 DIAGNOSIS — J9811 Atelectasis: Secondary | ICD-10-CM | POA: Diagnosis not present

## 2024-03-04 DIAGNOSIS — I10 Essential (primary) hypertension: Secondary | ICD-10-CM | POA: Diagnosis not present

## 2024-03-04 DIAGNOSIS — R7989 Other specified abnormal findings of blood chemistry: Secondary | ICD-10-CM | POA: Diagnosis not present

## 2024-03-04 DIAGNOSIS — Z8701 Personal history of pneumonia (recurrent): Secondary | ICD-10-CM

## 2024-03-04 DIAGNOSIS — Z79899 Other long term (current) drug therapy: Secondary | ICD-10-CM

## 2024-03-04 DIAGNOSIS — Z7141 Alcohol abuse counseling and surveillance of alcoholic: Secondary | ICD-10-CM

## 2024-03-04 DIAGNOSIS — I5A Non-ischemic myocardial injury (non-traumatic): Secondary | ICD-10-CM | POA: Diagnosis present

## 2024-03-04 DIAGNOSIS — R188 Other ascites: Secondary | ICD-10-CM | POA: Diagnosis present

## 2024-03-04 DIAGNOSIS — F1411 Cocaine abuse, in remission: Secondary | ICD-10-CM | POA: Diagnosis not present

## 2024-03-04 DIAGNOSIS — R069 Unspecified abnormalities of breathing: Secondary | ICD-10-CM | POA: Diagnosis not present

## 2024-03-04 DIAGNOSIS — Z823 Family history of stroke: Secondary | ICD-10-CM

## 2024-03-04 DIAGNOSIS — Z21 Asymptomatic human immunodeficiency virus [HIV] infection status: Secondary | ICD-10-CM | POA: Diagnosis not present

## 2024-03-04 DIAGNOSIS — I427 Cardiomyopathy due to drug and external agent: Secondary | ICD-10-CM | POA: Diagnosis not present

## 2024-03-04 DIAGNOSIS — I82409 Acute embolism and thrombosis of unspecified deep veins of unspecified lower extremity: Secondary | ICD-10-CM | POA: Insufficient documentation

## 2024-03-04 DIAGNOSIS — F329 Major depressive disorder, single episode, unspecified: Secondary | ICD-10-CM | POA: Diagnosis not present

## 2024-03-04 DIAGNOSIS — M549 Dorsalgia, unspecified: Secondary | ICD-10-CM | POA: Diagnosis present

## 2024-03-04 DIAGNOSIS — Z86718 Personal history of other venous thrombosis and embolism: Secondary | ICD-10-CM

## 2024-03-04 DIAGNOSIS — F1110 Opioid abuse, uncomplicated: Secondary | ICD-10-CM | POA: Diagnosis present

## 2024-03-04 DIAGNOSIS — Z9889 Other specified postprocedural states: Secondary | ICD-10-CM

## 2024-03-04 DIAGNOSIS — R918 Other nonspecific abnormal finding of lung field: Secondary | ICD-10-CM | POA: Diagnosis not present

## 2024-03-04 DIAGNOSIS — B20 Human immunodeficiency virus [HIV] disease: Secondary | ICD-10-CM | POA: Diagnosis present

## 2024-03-04 DIAGNOSIS — I82412 Acute embolism and thrombosis of left femoral vein: Secondary | ICD-10-CM | POA: Diagnosis not present

## 2024-03-04 DIAGNOSIS — F17210 Nicotine dependence, cigarettes, uncomplicated: Secondary | ICD-10-CM | POA: Diagnosis present

## 2024-03-04 DIAGNOSIS — F40240 Claustrophobia: Secondary | ICD-10-CM | POA: Diagnosis present

## 2024-03-04 DIAGNOSIS — Z7984 Long term (current) use of oral hypoglycemic drugs: Secondary | ICD-10-CM

## 2024-03-04 DIAGNOSIS — I3139 Other pericardial effusion (noninflammatory): Secondary | ICD-10-CM | POA: Diagnosis not present

## 2024-03-04 DIAGNOSIS — M419 Scoliosis, unspecified: Secondary | ICD-10-CM | POA: Diagnosis not present

## 2024-03-04 DIAGNOSIS — F1910 Other psychoactive substance abuse, uncomplicated: Secondary | ICD-10-CM | POA: Diagnosis not present

## 2024-03-04 DIAGNOSIS — F1011 Alcohol abuse, in remission: Secondary | ICD-10-CM | POA: Diagnosis present

## 2024-03-04 DIAGNOSIS — I82432 Acute embolism and thrombosis of left popliteal vein: Secondary | ICD-10-CM | POA: Diagnosis not present

## 2024-03-04 DIAGNOSIS — R0989 Other specified symptoms and signs involving the circulatory and respiratory systems: Secondary | ICD-10-CM | POA: Diagnosis not present

## 2024-03-04 DIAGNOSIS — N179 Acute kidney failure, unspecified: Secondary | ICD-10-CM | POA: Diagnosis not present

## 2024-03-04 DIAGNOSIS — I429 Cardiomyopathy, unspecified: Secondary | ICD-10-CM | POA: Diagnosis not present

## 2024-03-04 DIAGNOSIS — W19XXXA Unspecified fall, initial encounter: Secondary | ICD-10-CM | POA: Diagnosis present

## 2024-03-04 DIAGNOSIS — K219 Gastro-esophageal reflux disease without esophagitis: Secondary | ICD-10-CM | POA: Diagnosis not present

## 2024-03-04 DIAGNOSIS — R0602 Shortness of breath: Secondary | ICD-10-CM | POA: Diagnosis not present

## 2024-03-04 DIAGNOSIS — Z79624 Long term (current) use of inhibitors of nucleotide synthesis: Secondary | ICD-10-CM

## 2024-03-04 DIAGNOSIS — I509 Heart failure, unspecified: Secondary | ICD-10-CM

## 2024-03-04 DIAGNOSIS — J90 Pleural effusion, not elsewhere classified: Secondary | ICD-10-CM | POA: Diagnosis not present

## 2024-03-04 DIAGNOSIS — R600 Localized edema: Secondary | ICD-10-CM | POA: Diagnosis not present

## 2024-03-04 DIAGNOSIS — Z8719 Personal history of other diseases of the digestive system: Secondary | ICD-10-CM

## 2024-03-04 DIAGNOSIS — I5021 Acute systolic (congestive) heart failure: Secondary | ICD-10-CM | POA: Diagnosis not present

## 2024-03-04 DIAGNOSIS — R778 Other specified abnormalities of plasma proteins: Secondary | ICD-10-CM | POA: Diagnosis not present

## 2024-03-04 DIAGNOSIS — I5031 Acute diastolic (congestive) heart failure: Secondary | ICD-10-CM

## 2024-03-04 DIAGNOSIS — Z7901 Long term (current) use of anticoagulants: Secondary | ICD-10-CM

## 2024-03-04 DIAGNOSIS — I824Y2 Acute embolism and thrombosis of unspecified deep veins of left proximal lower extremity: Secondary | ICD-10-CM | POA: Diagnosis not present

## 2024-03-04 DIAGNOSIS — E876 Hypokalemia: Secondary | ICD-10-CM | POA: Diagnosis not present

## 2024-03-04 DIAGNOSIS — R0789 Other chest pain: Secondary | ICD-10-CM | POA: Diagnosis not present

## 2024-03-04 DIAGNOSIS — R609 Edema, unspecified: Secondary | ICD-10-CM | POA: Diagnosis not present

## 2024-03-04 DIAGNOSIS — R079 Chest pain, unspecified: Secondary | ICD-10-CM

## 2024-03-04 DIAGNOSIS — Z86711 Personal history of pulmonary embolism: Secondary | ICD-10-CM | POA: Diagnosis not present

## 2024-03-04 DIAGNOSIS — I110 Hypertensive heart disease with heart failure: Principal | ICD-10-CM | POA: Diagnosis present

## 2024-03-04 DIAGNOSIS — F1510 Other stimulant abuse, uncomplicated: Secondary | ICD-10-CM | POA: Diagnosis present

## 2024-03-04 DIAGNOSIS — F23 Brief psychotic disorder: Secondary | ICD-10-CM

## 2024-03-04 DIAGNOSIS — T502X5A Adverse effect of carbonic-anhydrase inhibitors, benzothiadiazides and other diuretics, initial encounter: Secondary | ICD-10-CM | POA: Diagnosis not present

## 2024-03-04 DIAGNOSIS — Z8711 Personal history of peptic ulcer disease: Secondary | ICD-10-CM

## 2024-03-04 DIAGNOSIS — R000 Tachycardia, unspecified: Secondary | ICD-10-CM | POA: Diagnosis not present

## 2024-03-04 DIAGNOSIS — G8929 Other chronic pain: Secondary | ICD-10-CM | POA: Diagnosis present

## 2024-03-04 DIAGNOSIS — M25511 Pain in right shoulder: Secondary | ICD-10-CM | POA: Diagnosis present

## 2024-03-04 DIAGNOSIS — Z833 Family history of diabetes mellitus: Secondary | ICD-10-CM

## 2024-03-04 DIAGNOSIS — F22 Delusional disorders: Secondary | ICD-10-CM | POA: Diagnosis present

## 2024-03-04 DIAGNOSIS — M545 Low back pain, unspecified: Secondary | ICD-10-CM | POA: Diagnosis present

## 2024-03-04 DIAGNOSIS — T43655A Adverse effect of methamphetamines, initial encounter: Secondary | ICD-10-CM | POA: Diagnosis present

## 2024-03-04 DIAGNOSIS — M1990 Unspecified osteoarthritis, unspecified site: Secondary | ICD-10-CM | POA: Diagnosis present

## 2024-03-04 DIAGNOSIS — M7989 Other specified soft tissue disorders: Secondary | ICD-10-CM | POA: Diagnosis not present

## 2024-03-04 DIAGNOSIS — Z91199 Patient's noncompliance with other medical treatment and regimen due to unspecified reason: Secondary | ICD-10-CM

## 2024-03-04 LAB — CBC
HCT: 45.3 % (ref 39.0–52.0)
Hemoglobin: 14.7 g/dL (ref 13.0–17.0)
MCH: 30.6 pg (ref 26.0–34.0)
MCHC: 32.5 g/dL (ref 30.0–36.0)
MCV: 94.4 fL (ref 80.0–100.0)
Platelets: 185 10*3/uL (ref 150–400)
RBC: 4.8 MIL/uL (ref 4.22–5.81)
RDW: 12.7 % (ref 11.5–15.5)
WBC: 4.4 10*3/uL (ref 4.0–10.5)
nRBC: 0 % (ref 0.0–0.2)

## 2024-03-04 LAB — BASIC METABOLIC PANEL WITH GFR
Anion gap: 9 (ref 5–15)
BUN: 13 mg/dL (ref 6–20)
CO2: 26 mmol/L (ref 22–32)
Calcium: 9.6 mg/dL (ref 8.9–10.3)
Chloride: 101 mmol/L (ref 98–111)
Creatinine, Ser: 1.26 mg/dL — ABNORMAL HIGH (ref 0.61–1.24)
GFR, Estimated: >60 mL/min (ref >60)
Glucose, Bld: 97 mg/dL (ref 70–99)
Potassium: 4.9 mmol/L (ref 3.5–5.1)
Sodium: 136 mmol/L (ref 135–145)

## 2024-03-04 LAB — TROPONIN I (HIGH SENSITIVITY)
Troponin I (High Sensitivity): 64 ng/L — ABNORMAL HIGH (ref <18)
Troponin I (High Sensitivity): 66 ng/L — ABNORMAL HIGH (ref <18)

## 2024-03-04 MED ORDER — IOHEXOL 350 MG/ML SOLN
75.0000 mL | Freq: Once | INTRAVENOUS | Status: AC | PRN
  Administered 2024-03-04: 75 mL via INTRAVENOUS

## 2024-03-04 MED ORDER — HYDROMORPHONE HCL 1 MG/ML IJ SOLN
1.0000 mg | Freq: Once | INTRAMUSCULAR | Status: AC
  Administered 2024-03-04: 1 mg via INTRAVENOUS
  Filled 2024-03-04: qty 1

## 2024-03-04 MED ORDER — ONDANSETRON HCL 4 MG/2ML IJ SOLN
4.0000 mg | Freq: Once | INTRAMUSCULAR | Status: AC
Start: 2024-03-04 — End: 2024-03-04
  Administered 2024-03-04: 4 mg via INTRAVENOUS
  Filled 2024-03-04: qty 2

## 2024-03-04 MED ORDER — LORAZEPAM 1 MG PO TABS
1.0000 mg | ORAL_TABLET | ORAL | Status: DC | PRN
  Administered 2024-03-05 – 2024-03-07 (×6): 1 mg via ORAL
  Filled 2024-03-04 (×6): qty 1

## 2024-03-04 MED ORDER — FUROSEMIDE 10 MG/ML IJ SOLN
60.0000 mg | Freq: Two times a day (BID) | INTRAMUSCULAR | Status: DC
  Administered 2024-03-05: 60 mg via INTRAVENOUS
  Filled 2024-03-04: qty 6

## 2024-03-04 MED ORDER — ASPIRIN 81 MG PO CHEW
324.0000 mg | CHEWABLE_TABLET | Freq: Once | ORAL | Status: AC
Start: 2024-03-04 — End: 2024-03-04
  Administered 2024-03-04: 324 mg via ORAL
  Filled 2024-03-04: qty 4

## 2024-03-04 MED ORDER — LOSARTAN POTASSIUM 50 MG PO TABS: 25.0000 mg | ORAL_TABLET | Freq: Every day | ORAL | Status: DC

## 2024-03-04 MED ORDER — ONDANSETRON HCL 4 MG/2ML IJ SOLN: 4.0000 mg | Freq: Four times a day (QID) | INTRAMUSCULAR | Status: DC | PRN

## 2024-03-04 MED ORDER — ACETAMINOPHEN 650 MG RE SUPP: 650.0000 mg | Freq: Four times a day (QID) | RECTAL | Status: DC | PRN

## 2024-03-04 MED ORDER — APIXABAN 5 MG PO TABS
10.0000 mg | ORAL_TABLET | Freq: Once | ORAL | Status: DC
  Administered 2024-03-04: 10 mg via ORAL
  Filled 2024-03-04: qty 2

## 2024-03-04 MED ORDER — ACETAMINOPHEN 325 MG PO TABS: 650.0000 mg | ORAL_TABLET | Freq: Four times a day (QID) | ORAL | Status: DC | PRN

## 2024-03-04 MED ORDER — OXYCODONE HCL 5 MG PO TABS
5.0000 mg | ORAL_TABLET | ORAL | Status: DC | PRN
  Administered 2024-03-04 – 2024-03-05 (×3): 5 mg via ORAL
  Filled 2024-03-04 (×3): qty 1

## 2024-03-04 MED ORDER — EMPAGLIFLOZIN 10 MG PO TABS: 10.0000 mg | ORAL_TABLET | Freq: Every day | ORAL | Status: DC

## 2024-03-04 MED ORDER — ONDANSETRON HCL 4 MG PO TABS: 4.0000 mg | ORAL_TABLET | Freq: Four times a day (QID) | ORAL | Status: DC | PRN

## 2024-03-04 MED ORDER — PANTOPRAZOLE SODIUM 40 MG PO TBEC
40.0000 mg | DELAYED_RELEASE_TABLET | Freq: Every day | ORAL | Status: DC
  Administered 2024-03-05 – 2024-03-11 (×7): 40 mg via ORAL
  Filled 2024-03-04 (×7): qty 1

## 2024-03-04 NOTE — Assessment & Plan Note (Addendum)
 Echocardiogram with reduced LV systolic function < 20%, severe LV cavity dilatation, RV systolic function with severe reduction, RV with mild enlargement, RVSP 37.1 mmHg, RA with severe dilatation, no significant valvular disease.   05/12 cardiac catheterization  RA 5  RV 32/7 PA 34/14 mean 21  PCWP 8  Cardiac output 4,35 and index 2,14 (Fick)   Patient was placed on aggressive diuretic therapy with IV furosemide , negative fluid balance was achieved, -9,551 ml, with significant improvement in his symptoms.   Plan to continue medical therapy with SGLT 2 inh, entresto, digoxin, carvedilol, and spironolactone   Coronary angiography with no minimal coronary artery disease, non ischemic cardiomyopathy. Troponin elevation due to heart failure exacerbation.

## 2024-03-04 NOTE — H&P (Signed)
 History and Physical    Patient: Devin [name] UJW:[id number] DOB: 1961-06-22 DOA: 03/04/2024 DOS: the patient was seen and examined on 03/04/2024 PCP: Patient, No Pcp Per  Patient coming from: Home  Chief Complaint:  Chief Complaint  Patient presents with   Shortness of Breath   HPI: Devin [name] is a 63 y.o. male with medical history significant of COPD, hypertension, history of pulmonary embolism, HIV infection and scoliosis who presented with lower extremity edema and dyspnea.  Patient reported 4 weeks of worsening lower extremity edema, but more severe over the last 2 days. It has been associated with exertional dyspnea but not frank angina symptoms.  Last night he had a episode of severe episode of paroxysmal nocturnal dyspnea. This morning while in the social security office he was noted in distress, prompting the personal to call EMS.  Patient was found tachycardic and in distress, then decision was made to transport him to the ED.   He sustained a fall 2 days ago, hitting his right shoulder, since his pain has been constant.  Apparently he is not taking any medications and has not seen his primary care provider in a long time.   Reports using methamphetamines for pain over last several weeks.  At the time of my examination his dyspnea is moderate in intensity and is worse with movement, no improving factors.     Review of Systems: As mentioned in the history of present illness. All other systems reviewed and are negative. Past Medical History:  Diagnosis Date   Anxiety    Arthritis    "I'm eat up w/it" (02/20/2017)   Asthma    Chronic back pain    "the whole back" (02/20/2017)   Chronic bronchitis (HCC)    Complication of anesthesia    "felt like I couldn't breath coming out of it"   COPD (chronic obstructive pulmonary disease) (HCC)    Depression    GERD (gastroesophageal reflux disease)    History of hiatal hernia    History of stomach ulcers    "bleeding ones; I was  young then"   HIV infection (HCC) dx'd ~ 1999   Hypertension    Pneumonia    "several times" (02/20/2017)   Prolapsed disk 10/28   Pulmonary embolism (HCC) 02/20/2017   Scoliosis 08/24/13   Past Surgical History:  Procedure Laterality Date   BACK SURGERY  2019   KNEE ARTHROSCOPY Right 1980s   Social History:  reports that he has been smoking cigarettes. He has a 48 pack-year smoking history. He has never used smokeless tobacco. He reports that he does not currently use alcohol. He reports current drug use. Drug: Marijuana.  No Known Allergies  Family History  Problem Relation Age of Onset   Diabetes Father    Stroke Other    Colon cancer Neg Hx     Prior to Admission medications   Medication Sig Start Date End Date Taking? Authorizing Provider  albuterol  (VENTOLIN  HFA) 108 (90 Base) MCG/ACT inhaler Inhale 2 puffs into the lungs every 4 (four) hours as needed for wheezing or shortness of breath. Patient not taking: Reported on 03/04/2024 02/12/24   Sandie Cross, MD  atorvastatin  (LIPITOR) 10 MG tablet Take 1 tablet (10 mg total) by mouth daily. Patient not taking: Reported on 03/04/2024 12/26/23   Sandie Cross, MD  cyclobenzaprine  (FLEXERIL ) 10 MG tablet Take 10 mg by mouth 3 (three) times daily as needed for muscle spasms. Patient not taking: Reported on 03/04/2024  [provider]  diazepam  (VALIUM ) 10 MG tablet Take 10 mg by mouth in the morning and at bedtime. Patient not taking: Reported on 03/04/2024    [provider]  docusate sodium  (COLACE) 100 MG capsule Take 1 capsule (100 mg total) by mouth 2 (two) times daily. Patient not taking: Reported on 06/16/2023 06/03/23   Silas Drivers, MD  DULoxetine  (CYMBALTA ) 60 MG capsule Take 1 capsule (60 mg total) by mouth daily. Patient not taking: Reported on 03/04/2024 12/26/23   Sandie Cross, MD  elvitegravir-cobicistat-emtricitabine -tenofovir  (GENVOYA ) 150-150-200-10 MG TABS tablet Take 1 tablet by mouth  daily with breakfast. Patient not taking: Reported on 03/04/2024 12/26/23   Sandie Cross, MD  gabapentin  (NEURONTIN ) 100 MG capsule Take 2 capsules (200 mg total) by mouth 3 (three) times daily. Patient not taking: Reported on 03/04/2024 06/26/23   Juliana Ocean, DO  hydrOXYzine  (ATARAX ) 25 MG tablet Take 1 tablet (25 mg total) by mouth 3 (three) times daily as needed for anxiety. Patient not taking: Reported on 03/04/2024 12/26/23   Sandie Cross, MD  lidocaine  (LIDODERM ) 5 % Place 1 patch onto the skin daily. Remove & Discard patch within 12 hours or as directed by MD Patient not taking: Reported on 03/04/2024 06/26/23   Juliana Ocean, DO  methocarbamol  (ROBAXIN ) 500 MG tablet Take 1 tablet (500 mg total) by mouth 3 (three) times daily. Patient not taking: Reported on 03/04/2024 06/26/23   Juliana Ocean, DO  OLANZapine  (ZYPREXA ) 10 MG tablet Take 1 tablet (10 mg total) by mouth at bedtime. Patient not taking: Reported on 03/04/2024 12/26/23   Sandie Cross, MD  pantoprazole  (PROTONIX ) 40 MG tablet Take 1 tablet (40 mg total) by mouth daily. Patient not taking: Reported on 03/04/2024 06/26/23   Juliana Ocean, DO  risperiDONE  (RISPERDAL ) 0.5 MG tablet Take 1 tablet (0.5 mg total) by mouth 2 (two) times daily at 8 am and 4 pm. Patient not taking: Reported on 03/04/2024 06/26/23   Juliana Ocean, DO    Physical Exam: Vitals:   03/04/24 1848 03/04/24 1900 03/04/24 2100 03/04/24 2117  BP: (!) 166/137 (!) 135/96 (!) 130/99 116/76  Pulse: (!) 109 (!) 109 (!) 106 (!) 108  Resp: 20 16 17 20   Temp:  98 F (36.7 C) 98.1 F (36.7 C) 97.7 F (36.5 C)  TempSrc:  Oral Oral Oral  SpO2: 92% 91% 95% 92%  Weight:      Height:       Neurology awake and alert, positive anxiety and evident pain  ENT with mild pallor Cardiovascular with S1 and S2 present and regular with no gallops, rubs or murmurs Positive JVD Positive lower extremity edema +++  up to the  hips Respiratory with prolonged expiratory phase with no wheezing or rhonchi, positive rales at bases Abdomen with no distention  Data Reviewed:   Na 136, K 4.9 Cl 101 bicarbonate 26 bun 13 cr 1,26  High sensitive troponin 64 and 66 Wbc 4.4 hgb 14.7 plt 185   Chest radiograph with hypoinflation, with positive cardiomegaly, bilateral hilar vascular congestion and bilateral pleural effusions, fluid in the right fissure  CT chest with no evidence of pulmonary embolism.  Mild to moderate bilateral pleural effusions. Bilateral ground glass opacities.  Minimal pericardial effusion  Mild ascites   Lower extremities US  with partially occlusive peripheral thrombus in the distal left femoral vein and popliteal vein. This could be a partially occlusive acute thrombus or recanalized chronic thrombus.  No evidence of right lower extremity DVT.   EKG 116 bpm, normal axis, normal intervals, qtc 477, sinus rhythm with poor R R wave progression with no significant ST segment or T wave changes. Positive LVH.   Assessment and Plan: * Acute heart failure (HCC) Patient with signs of volume overload.   Plan to start patient on diuresis with furosemide  60 mg IV bid Add SGLT 2 inh and ARB.  Follow up on echocardiogram.  Further medical therapy depending on LV systolic function.   Troponin elevation possible due to heart failure exacerbation, currently no signs of acute coronary syndrome.   Essential hypertension, benign Start patient on ARB and continue close blood pressure monitoring   Human immunodeficiency virus (HIV) disease (HCC) Need to check old records and follow up with ID   GERD Continue pantoprazole    Substance abuse (HCC) Patient has been using methamphetamines.  Positive anxiety, will add as needed lorazepam .   History of alcohol abuse, with no signs of acute withdrawal.   Right shoulder pain, continue oral analgesics   History of pulmonary embolism Left non occlusive thrombus,  possible acute vs recannulization Considering worsening symptoms and prior history of PE will resume anticoagulation          Advance Care Planning:   Code Status: Full Code   Consults: none   Family Communication: no family at the bedside   Severity of Illness: The appropriate patient status for this patient is INPATIENT. Inpatient status is judged to be reasonable and necessary in order to provide the required intensity of service to ensure the patient's safety. The patient's presenting symptoms, physical exam findings, and initial radiographic and laboratory data in the context of their chronic comorbidities is felt to place them at high risk for further clinical deterioration. Furthermore, it is not anticipated that the patient will be medically stable for discharge from the hospital within 2 midnights of admission.   * I certify that at the point of admission it is my clinical judgment that the patient will require inpatient hospital care spanning beyond 2 midnights from the point of admission due to high intensity of service, high risk for further deterioration and high frequency of surveillance required.*  Author: Albertus Alt, MD 03/04/2024 11:36 PM  For on call review [email or web address].

## 2024-03-04 NOTE — Assessment & Plan Note (Signed)
 Continue pantoprazole.

## 2024-03-04 NOTE — Assessment & Plan Note (Addendum)
 Patient has been using methamphetamines.  Positive anxiety. Plan to continue with as needed lorazepam   History of alcohol abuse, with no signs of acute withdrawal.   Right shoulder pain, continue oral analgesics

## 2024-03-04 NOTE — ED Notes (Signed)
 Daughter called and given update on patient plan of care. Daughter requesting we do a UDS and psych eval for hallucinations/ aggressive behavior/ and HI to neighbors. Admitting provider made aware.

## 2024-03-04 NOTE — ED Notes (Signed)
 Attempted to call report at this time. Brian Campanile, RN states room has not been assigned. To call back.

## 2024-03-04 NOTE — ED Triage Notes (Signed)
 Pt arrives via [facility] from social security office where he drove himself with reports of pain in right shoulder and shortness of breath with swelling in both legs right worse than left. Pt endorses a fall a t home 2-3 days ago with no LOC, did not hit head, no blood thinners. Pt does report history of blood clots in legs and lungs but has been off of blood thinner for over a year. Pt speaking in full sentences. VS with EMS 97% room air, , 114 HR. Pt is not on any medications at home at this time and has not seen a PCP in year.

## 2024-03-04 NOTE — Assessment & Plan Note (Addendum)
 Resume antiretroviral therapy, changed to Biktarvy, to avoid drug drug interaction with apixaban .

## 2024-03-04 NOTE — Assessment & Plan Note (Addendum)
 Continue blood pressure control with Entreso and carvedilol.

## 2024-03-04 NOTE — ED Provider Notes (Signed)
 [facility]-EMERGENCY DEPT [facility] Emergency Department Provider Note MRN:  [id number]  Arrival date & time: 03/04/24     Chief Complaint   Shortness of Breath   History of Present Illness   Devin [name] is a 63 y.o. year-old male with a history of COPD, HIV, PE presenting to the ED with chief complaint of shortness of breath.  Worsening shortness of breath, right-sided chest pain over the past few days.  Fell while reaching for the TV remote, fell onto his right side, right shoulder, having worsening right-sided chest pain because of this.  Used to be on blood thinners but has not been over the past year.  Review of Systems  A thorough review of systems was obtained and all systems are negative except as noted in the HPI and PMH.   Patient's Health History    Past Medical History:  Diagnosis Date   Anxiety    Arthritis    "I'm eat up w/it" (02/20/2017)   Asthma    Chronic back pain    "the whole back" (02/20/2017)   Chronic bronchitis (HCC)    Complication of anesthesia    "felt like I couldn't breath coming out of it"   COPD (chronic obstructive pulmonary disease) (HCC)    Depression    GERD (gastroesophageal reflux disease)    History of hiatal hernia    History of stomach ulcers    "bleeding ones; I was young then"   HIV infection (HCC) dx'd ~ 1999   Hypertension    Pneumonia    "several times" (02/20/2017)   Prolapsed disk 10/28   Pulmonary embolism (HCC) 02/20/2017   Scoliosis 08/24/13    Past Surgical History:  Procedure Laterality Date   BACK SURGERY  2019   KNEE ARTHROSCOPY Right 1980s    Family History  Problem Relation Age of Onset   Diabetes Father    Stroke Other    Colon cancer Neg Hx     Social History   Socioeconomic History   Marital status: Divorced    Spouse name: Not on file   Number of children: Not on file   Years of education: Not on file   Highest education level: Not on file  Occupational History   Not on file  Tobacco Use    Smoking status: Every Day    Current packs/day: 1.00    Average packs/day: 1 pack/day for 48.0 years (48.0 ttl pk-yrs)    Types: Cigarettes   Smokeless tobacco: Never  Vaping Use   Vaping status: Never Used  Substance and Sexual Activity   Alcohol use: Not Currently    Comment: last used about 9 years ago   Drug use: Yes    Types: Marijuana    Comment: denies   Sexual activity: Yes    Partners: Female    Comment: declined condoms  Other Topics Concern   Not on file  Social History Narrative   Not on file   Social Drivers of Health   Financial Resource Strain: Not on file  Food Insecurity: Food Insecurity Present (06/17/2023)   Hunger Vital Sign    Worried About Running Out of Food in the Last Year: Sometimes true    Ran Out of Food in the Last Year: Never true  Transportation Needs: No Transportation Needs (06/17/2023)   PRAPARE - Administrator, Civil Service (Medical): No    Lack of Transportation (Non-Medical): No  Recent Concern: Transportation Needs - Unmet Transportation Needs (05/28/2023)  PRAPARE - Administrator, Civil Service (Medical): Yes    Lack of Transportation (Non-Medical): Yes  Physical Activity: Not on file  Stress: Not on file  Social Connections: Not on file  Intimate Partner Violence: Not At Risk (06/17/2023)   Humiliation, Afraid, Rape, and Kick questionnaire    Fear of Current or Ex-Partner: No    Emotionally Abused: No    Physically Abused: No    Sexually Abused: No     Physical Exam   Vitals:   03/04/24 1730 03/04/24 1848  BP: 93/65 (!) 166/137  Pulse: (!) 110 (!) 109  Resp: (!) 23 20  Temp:    SpO2: 95% 92%    CONSTITUTIONAL: Chronically ill-appearing, moderate distress due to pain NEURO/PSYCH:  Alert and oriented x 3, no focal deficits EYES:  eyes equal and reactive ENT/NECK:  no LAD, no JVD CARDIO: Tachycardic rate, well-perfused, normal S1 and S2 PULM: Tachypneic, no wheezing GI/GU:  non-distended,  non-tender MSK/SPINE:  No gross deformities, asymmetric lower extremity edema left greater than right SKIN:  no rash, atraumatic   *Additional and/or pertinent findings included in MDM below  Diagnostic and Interventional Summary    EKG Interpretation Date/Time:  Thursday Mar 04 2024 14:42:10 EDT Ventricular Rate:  116 PR Interval:  160 QRS Duration:  95 QT Interval:  343 QTC Calculation: 477 R Axis:   -2  Text Interpretation: Sinus tachycardia Probable left atrial enlargement Probable anteroseptal infarct, recent Confirmed by Gwenetta Lennert [phone]) on 03/04/2024 3:01:25 PM       Labs Reviewed  BASIC METABOLIC PANEL WITH GFR - Abnormal; Notable for the following components:      Result Value   Creatinine, Ser 1.26 (*)    All other components within normal limits  TROPONIN I (HIGH SENSITIVITY) - Abnormal; Notable for the following components:   Troponin I (High Sensitivity) 64 (*)    All other components within normal limits  TROPONIN I (HIGH SENSITIVITY) - Abnormal; Notable for the following components:   Troponin I (High Sensitivity) 66 (*)    All other components within normal limits  CBC    CT Angio Chest Pulmonary Embolism (PE) W or WO Contrast  Final Result    US  Venous Img Lower Bilateral (DVT)  Final Result    DG Chest Port 1 View  Final Result      Medications  apixaban  (ELIQUIS ) tablet 10 mg (has no administration in time range)  HYDROmorphone  (DILAUDID ) injection 1 mg (1 mg Intravenous Given 03/04/24 1522)  ondansetron  (ZOFRAN ) injection 4 mg (4 mg Intravenous Given 03/04/24 1523)  iohexol  (OMNIPAQUE ) 350 MG/ML injection 75 mL (75 mLs Intravenous Contrast Given 03/04/24 1717)  HYDROmorphone  (DILAUDID ) injection 1 mg (1 mg Intravenous Given 03/04/24 1842)  aspirin chewable tablet 324 mg (324 mg Oral Given 03/04/24 1844)     Procedures  /  Critical Care .Critical Care  Performed by: Edson Graces, MD Authorized by: Edson Graces, MD   Critical care provider  statement:    Critical care time (minutes):  35   Critical care was necessary to treat or prevent imminent or life-threatening deterioration of the following conditions: NSTEMI.   Critical care was time spent personally by me on the following activities:  Development of treatment plan with patient or surrogate, discussions with consultants, evaluation of patient's response to treatment, examination of patient, ordering and review of laboratory studies, ordering and review of radiographic studies, ordering and performing treatments and interventions, pulse oximetry, re-evaluation of  patient's condition and review of old charts   ED Course and Medical Decision Making  Initial Impression and Ddx Initial concern for pneumothorax versus PE versus MSK related pain.  ACS also considered.  Seems uncomfortable.  Large left leg that is quite swollen raising concern for VTE.  Past medical/surgical history that increases complexity of ED encounter: History of VTE  Interpretation of Diagnostics I personally reviewed the EKG and my interpretation is as follows: Sinus tachycardia  No significant blood count or electrolyte disturbance.  Troponin mildly elevated, flat upon repeat  Patient Reassessment and Ultimate Disposition/Management     CT without evidence of PE.  Will request hospitalist admission.    Patient management required discussion with the following services or consulting groups:  Hospitalist Service and Cardiology  Complexity of Problems Addressed Acute illness or injury that poses threat of life of bodily function  Additional Data Reviewed and Analyzed Further history obtained from: Prior labs/imaging results  Additional Factors Impacting ED Encounter Risk Consideration of hospitalization  Merrick Abe. Harless Lien, MD [facility] Emergency Medicine [facility] [email or web address]  Final Clinical Impressions(s) / ED Diagnoses     ICD-10-CM   1. Shortness of breath   R06.02     2. Chest pain, unspecified type  R07.9     3. Deep vein thrombosis (DVT) of proximal vein of left lower extremity, unspecified chronicity (HCC)  I82.4Y2     4. Elevated troponin  R79.89       ED Discharge Orders     None        Discharge Instructions Discussed with and Provided to Patient:   Discharge Instructions   None      Edson Graces, MD 03/04/24 [phone]

## 2024-03-04 NOTE — Assessment & Plan Note (Addendum)
 Left non occlusive thrombus, possible acute vs recannulization Considering worsening symptoms and prior history of PE  Decision was made to continue anticoagulation with apixaban .

## 2024-03-05 ENCOUNTER — Inpatient Hospital Stay (HOSPITAL_COMMUNITY)

## 2024-03-05 DIAGNOSIS — F40240 Claustrophobia: Secondary | ICD-10-CM | POA: Diagnosis present

## 2024-03-05 DIAGNOSIS — W19XXXA Unspecified fall, initial encounter: Secondary | ICD-10-CM | POA: Diagnosis present

## 2024-03-05 DIAGNOSIS — M419 Scoliosis, unspecified: Secondary | ICD-10-CM | POA: Diagnosis present

## 2024-03-05 DIAGNOSIS — I427 Cardiomyopathy due to drug and external agent: Secondary | ICD-10-CM | POA: Diagnosis present

## 2024-03-05 DIAGNOSIS — I5A Non-ischemic myocardial injury (non-traumatic): Secondary | ICD-10-CM

## 2024-03-05 DIAGNOSIS — F329 Major depressive disorder, single episode, unspecified: Secondary | ICD-10-CM | POA: Diagnosis present

## 2024-03-05 DIAGNOSIS — F1110 Opioid abuse, uncomplicated: Secondary | ICD-10-CM | POA: Diagnosis present

## 2024-03-05 DIAGNOSIS — I824Y2 Acute embolism and thrombosis of unspecified deep veins of left proximal lower extremity: Secondary | ICD-10-CM | POA: Diagnosis not present

## 2024-03-05 DIAGNOSIS — F1011 Alcohol abuse, in remission: Secondary | ICD-10-CM | POA: Diagnosis present

## 2024-03-05 DIAGNOSIS — I82432 Acute embolism and thrombosis of left popliteal vein: Secondary | ICD-10-CM | POA: Diagnosis present

## 2024-03-05 DIAGNOSIS — R000 Tachycardia, unspecified: Secondary | ICD-10-CM | POA: Diagnosis not present

## 2024-03-05 DIAGNOSIS — K219 Gastro-esophageal reflux disease without esophagitis: Secondary | ICD-10-CM | POA: Diagnosis present

## 2024-03-05 DIAGNOSIS — Z21 Asymptomatic human immunodeficiency virus [HIV] infection status: Secondary | ICD-10-CM

## 2024-03-05 DIAGNOSIS — R188 Other ascites: Secondary | ICD-10-CM | POA: Diagnosis present

## 2024-03-05 DIAGNOSIS — F22 Delusional disorders: Secondary | ICD-10-CM | POA: Diagnosis present

## 2024-03-05 DIAGNOSIS — I110 Hypertensive heart disease with heart failure: Secondary | ICD-10-CM | POA: Diagnosis present

## 2024-03-05 DIAGNOSIS — I82412 Acute embolism and thrombosis of left femoral vein: Secondary | ICD-10-CM | POA: Diagnosis present

## 2024-03-05 DIAGNOSIS — I509 Heart failure, unspecified: Secondary | ICD-10-CM

## 2024-03-05 DIAGNOSIS — E876 Hypokalemia: Secondary | ICD-10-CM | POA: Diagnosis not present

## 2024-03-05 DIAGNOSIS — F17210 Nicotine dependence, cigarettes, uncomplicated: Secondary | ICD-10-CM | POA: Diagnosis present

## 2024-03-05 DIAGNOSIS — I429 Cardiomyopathy, unspecified: Secondary | ICD-10-CM | POA: Diagnosis not present

## 2024-03-05 DIAGNOSIS — I5021 Acute systolic (congestive) heart failure: Secondary | ICD-10-CM | POA: Diagnosis present

## 2024-03-05 DIAGNOSIS — N179 Acute kidney failure, unspecified: Secondary | ICD-10-CM | POA: Diagnosis present

## 2024-03-05 DIAGNOSIS — I3139 Other pericardial effusion (noninflammatory): Secondary | ICD-10-CM

## 2024-03-05 DIAGNOSIS — I5031 Acute diastolic (congestive) heart failure: Secondary | ICD-10-CM | POA: Diagnosis not present

## 2024-03-05 DIAGNOSIS — R0602 Shortness of breath: Secondary | ICD-10-CM | POA: Diagnosis present

## 2024-03-05 DIAGNOSIS — J4489 Other specified chronic obstructive pulmonary disease: Secondary | ICD-10-CM | POA: Diagnosis present

## 2024-03-05 DIAGNOSIS — T502X5A Adverse effect of carbonic-anhydrase inhibitors, benzothiadiazides and other diuretics, initial encounter: Secondary | ICD-10-CM | POA: Diagnosis present

## 2024-03-05 DIAGNOSIS — F1510 Other stimulant abuse, uncomplicated: Secondary | ICD-10-CM | POA: Diagnosis present

## 2024-03-05 DIAGNOSIS — I10 Essential (primary) hypertension: Secondary | ICD-10-CM | POA: Diagnosis not present

## 2024-03-05 DIAGNOSIS — F1411 Cocaine abuse, in remission: Secondary | ICD-10-CM | POA: Diagnosis present

## 2024-03-05 DIAGNOSIS — J9811 Atelectasis: Secondary | ICD-10-CM | POA: Diagnosis present

## 2024-03-05 DIAGNOSIS — B20 Human immunodeficiency virus [HIV] disease: Secondary | ICD-10-CM | POA: Diagnosis present

## 2024-03-05 LAB — CBC
HCT: 43.1 % (ref 39.0–52.0)
Hemoglobin: 12.9 g/dL — ABNORMAL LOW (ref 13.0–17.0)
MCH: 28.9 pg (ref 26.0–34.0)
MCHC: 29.9 g/dL — ABNORMAL LOW (ref 30.0–36.0)
MCV: 96.6 fL (ref 80.0–100.0)
Platelets: 153 10*3/uL (ref 150–400)
RBC: 4.46 MIL/uL (ref 4.22–5.81)
RDW: 12.5 % (ref 11.5–15.5)
WBC: 4.3 10*3/uL (ref 4.0–10.5)
nRBC: 0 % (ref 0.0–0.2)

## 2024-03-05 LAB — RAPID URINE DRUG SCREEN, HOSP PERFORMED
Amphetamines: NOT DETECTED
Barbiturates: NOT DETECTED
Benzodiazepines: NOT DETECTED
Cocaine: NOT DETECTED
Opiates: NOT DETECTED
Tetrahydrocannabinol: NOT DETECTED

## 2024-03-05 LAB — BASIC METABOLIC PANEL WITH GFR
Anion gap: 7 (ref 5–15)
BUN: 15 mg/dL (ref 6–20)
CO2: 31 mmol/L (ref 22–32)
Calcium: 8.9 mg/dL (ref 8.9–10.3)
Chloride: 100 mmol/L (ref 98–111)
Creatinine, Ser: 1.43 mg/dL — ABNORMAL HIGH (ref 0.61–1.24)
GFR, Estimated: 56 mL/min — ABNORMAL LOW (ref >60)
Glucose, Bld: 110 mg/dL — ABNORMAL HIGH (ref 70–99)
Potassium: 4 mmol/L (ref 3.5–5.1)
Sodium: 138 mmol/L (ref 135–145)

## 2024-03-05 LAB — HEPATIC FUNCTION PANEL
ALT: 22 U/L (ref 0–44)
AST: 30 U/L (ref 15–41)
Albumin: 3.7 g/dL (ref 3.5–5.0)
Alkaline Phosphatase: 84 U/L (ref 38–126)
Bilirubin, Direct: 0.1 mg/dL (ref 0.0–0.2)
Indirect Bilirubin: 0.6 mg/dL (ref 0.3–0.9)
Total Bilirubin: 0.7 mg/dL (ref 0.0–1.2)
Total Protein: 7.2 g/dL (ref 6.5–8.1)

## 2024-03-05 LAB — ECHOCARDIOGRAM COMPLETE
Area-P 1/2: 5.42 cm2
Calc EF: 13.3 %
Est EF: <20
Height: 72 in
S' Lateral: 6.1 cm
Single Plane A2C EF: 17 %
Single Plane A4C EF: 7.7 %
Weight: 2912 [oz_av]

## 2024-03-05 LAB — BRAIN NATRIURETIC PEPTIDE: B Natriuretic Peptide: 1823 pg/mL — ABNORMAL HIGH (ref 0.0–100.0)

## 2024-03-05 LAB — TSH: TSH: 8.71 u[IU]/mL — ABNORMAL HIGH (ref 0.350–4.500)

## 2024-03-05 MED ORDER — APIXABAN 5 MG PO TABS
5.0000 mg | ORAL_TABLET | Freq: Two times a day (BID) | ORAL | Status: DC
  Administered 2024-03-05 – 2024-03-06 (×2): 5 mg via ORAL
  Filled 2024-03-05 (×2): qty 1

## 2024-03-05 MED ORDER — HYDROXYZINE HCL 25 MG PO TABS
25.0000 mg | ORAL_TABLET | Freq: Three times a day (TID) | ORAL | Status: DC | PRN
  Filled 2024-03-05: qty 1

## 2024-03-05 MED ORDER — APIXABAN 5 MG PO TABS: 5.0000 mg | ORAL_TABLET | Freq: Two times a day (BID) | ORAL | Status: DC

## 2024-03-05 MED ORDER — ELVITEG-COBIC-EMTRICIT-TENOFAF 150-150-200-10 MG PO TABS
1.0000 | ORAL_TABLET | Freq: Every day | ORAL | Status: DC
  Administered 2024-03-06 – 2024-03-10 (×5): 1 via ORAL
  Filled 2024-03-05 (×6): qty 1

## 2024-03-05 MED ORDER — OLANZAPINE 10 MG PO TABS
10.0000 mg | ORAL_TABLET | Freq: Every day | ORAL | Status: DC
  Administered 2024-03-05 – 2024-03-10 (×6): 10 mg via ORAL
  Filled 2024-03-05 (×7): qty 1

## 2024-03-05 MED ORDER — FUROSEMIDE 10 MG/ML IJ SOLN: 100.0000 mg | Freq: Two times a day (BID) | INTRAVENOUS | Status: DC

## 2024-03-05 MED ORDER — GABAPENTIN 100 MG PO CAPS
200.0000 mg | ORAL_CAPSULE | Freq: Three times a day (TID) | ORAL | Status: DC
  Administered 2024-03-05 – 2024-03-11 (×18): 200 mg via ORAL
  Filled 2024-03-05 (×18): qty 2

## 2024-03-05 MED ORDER — APIXABAN 5 MG PO TABS
10.0000 mg | ORAL_TABLET | Freq: Two times a day (BID) | ORAL | Status: DC
Start: 2024-03-05 — End: 2024-03-05
  Administered 2024-03-05: 10 mg via ORAL
  Filled 2024-03-05: qty 2

## 2024-03-05 MED ORDER — ALBUTEROL SULFATE (2.5 MG/3ML) 0.083% IN NEBU: 3.0000 mL | INHALATION_SOLUTION | RESPIRATORY_TRACT | Status: DC | PRN

## 2024-03-05 MED ORDER — APIXABAN 2.5 MG PO TABS: 2.5000 mg | ORAL_TABLET | Freq: Two times a day (BID) | ORAL | Status: DC

## 2024-03-05 MED ORDER — DULOXETINE HCL 60 MG PO CPEP
60.0000 mg | ORAL_CAPSULE | Freq: Every day | ORAL | Status: DC
  Administered 2024-03-05 – 2024-03-11 (×7): 60 mg via ORAL
  Filled 2024-03-05 (×7): qty 1

## 2024-03-05 MED ORDER — FUROSEMIDE 10 MG/ML IJ SOLN: 40.0000 mg | Freq: Two times a day (BID) | INTRAMUSCULAR | Status: DC

## 2024-03-05 MED ORDER — PERFLUTREN LIPID MICROSPHERE
1.0000 mL | INTRAVENOUS | Status: AC | PRN
  Administered 2024-03-05: 2 mL via INTRAVENOUS

## 2024-03-05 MED ORDER — FUROSEMIDE 10 MG/ML IJ SOLN
15.0000 mg/h | INTRAVENOUS | Status: DC
  Administered 2024-03-05: 10 mg/h via INTRAVENOUS
  Administered 2024-03-06 – 2024-03-07 (×3): 15 mg/h via INTRAVENOUS
  Filled 2024-03-05 (×6): qty 20

## 2024-03-05 MED ORDER — ATORVASTATIN CALCIUM 10 MG PO TABS
10.0000 mg | ORAL_TABLET | Freq: Every day | ORAL | Status: DC
  Administered 2024-03-05 – 2024-03-11 (×7): 10 mg via ORAL
  Filled 2024-03-05 (×7): qty 1

## 2024-03-05 NOTE — Progress Notes (Signed)
    Via secure chat, Dr. Mallipeddi made the Hospitalist and us  aware of the patient's echocardiogram results which showed his EF is less than 20% and severe RV dysfunction. She plans to increase his Lasix  drip to 15 mg/h and follow urine output. If no significant output, will need PICC and inotropic support. Given no cardiology coverage at [facility] over the weekend and in the setting of his new cardiomyopathy, the plan is to transfer to [facility] on the Hospitalist service and have Advanced Heart Failure see there. He will likely not be a candidate for long-term advanced therapies given his noncompliance and continued substance use. Card Master made aware of transfer.  Signed, Dorma Gash, PA-C 03/05/2024, 4:51 PM Pager: [phone]

## 2024-03-05 NOTE — Progress Notes (Signed)
 2D echo attempted, patient in bathroom. Will try later

## 2024-03-05 NOTE — Progress Notes (Signed)
   03/05/24 1156  ReDS Vest / Clip  Station Marker D  Ruler Value 37  ReDS Value Range  (low quality x 3)  ReDS Actual Value  (low quality x 3)

## 2024-03-05 NOTE — BH Assessment (Signed)
 TTS contacted patient's nurse to inquire about status of a UDS that was needed prior to patient being assessed and was informed that patient has not been medically cleared. This writer advised that patient will be seen once medically cleared. Will continue to follow for updates.

## 2024-03-05 NOTE — TOC Initial Note (Signed)
 Transition of Care [facility]) - Initial/Assessment Note    Patient Details  Name: Devin [name] MRN: [id number] Date of Birth: Aug 30, 1961  [facility]) CM/SW Contact:    Ander Katos, LCSW Phone Number: 03/05/2024, 10:41 AM  Clinical Narrative:  Pt admitted for acute heart failure. Assessment completed due to high risk readmission score. Pt reports he lives alone. He admits he has some difficulty managing at home. LCSW discussed possibility of placement if needed and pt states he will return home and will not consider placement. No PCP. LCSW added PCP list to AVS. TOC will continue to follow.                  Expected Discharge Plan: Home/Self Care Barriers to Discharge: Continued Medical Work up   Patient Goals and CMS Choice Patient states their goals for this hospitalization and ongoing recovery are:: return home   Choice offered to / list presented to : Patient [place] ownership interest in [facility].provided to::  (n/a)    Expected Discharge Plan and Services In-house Referral: Clinical Social Work     Living arrangements for the past 2 months: Single Family Home                                      Prior Living Arrangements/Services Living arrangements for the past 2 months: Single Family Home Lives with:: Self Patient language and need for interpreter reviewed:: Yes Do you feel safe going back to the place where you live?: Yes            Criminal Activity/Legal Involvement Pertinent to Current Situation/Hospitalization: No - Comment as needed  Activities of Daily Living   ADL Screening (condition at time of admission) Independently performs ADLs?: Yes (appropriate for developmental age) Is the patient deaf or have difficulty hearing?: No Does the patient have difficulty seeing, even when wearing glasses/contacts?: No Does the patient have difficulty concentrating, remembering, or making decisions?: No  Permission  Sought/Granted                  Emotional Assessment       Orientation: : Oriented to Self, Oriented to Place, Oriented to  Time, Oriented to Situation      Admission diagnosis:  Shortness of breath [R06.02] Elevated troponin [R79.89] Chest pain [R07.9] Chest pain, unspecified type [R07.9] Deep vein thrombosis (DVT) of proximal vein of left lower extremity, unspecified chronicity (HCC) [I82.4Y2] Acute exacerbation of CHF (congestive heart failure) (HCC) [I50.9] Patient Active Problem List   Diagnosis Date Noted   Acute exacerbation of CHF (congestive heart failure) (HCC) 03/05/2024   Acute heart failure (HCC) 03/04/2024   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   Acute psychosis (HCC) 05/28/2023   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   Auditory hallucination 05/07/2023   Asthmatic bronchitis , chronic (HCC) 05/29/2022   Cigarette smoker 05/29/2022   Poor dentition requiring referral to dentistry 05/08/2021   S/P spinal fusion 01/23/2018   At risk for obstructive sleep apnea 12/05/2017   Hepatitis B immune 09/10/2017   Spondylolisthesis of lumbar region 06/06/2017   History of pulmonary embolism 02/20/2017   Pulmonary infarct (HCC) 02/20/2017   Adolescent idiopathic scoliosis of thoracolumbar region 09/14/2015   Dysphagia 01/11/2014   Encounter for screening colonoscopy 01/11/2014   ETOH abuse 12/01/2013   Encounter for long-term (current) use of other medications 04/26/2013   Other long term (  current) drug therapy 04/26/2013   Essential hypertension, benign 04/12/2013   CHEST PAIN 06/12/2010   ABDOMINAL PAIN 06/12/2010   ACUTE SINUSITIS, UNSPECIFIED 11/24/2009   ALLERGIC RHINITIS 02/14/2009   Tobacco dependence syndrome 11/15/2008   ACUTE BRONCHITIS 03/04/2008   DEPRESSION 12/03/2006   LOW BACK PAIN 12/03/2006   Human immunodeficiency virus (HIV) disease (HCC) 11/13/2006   Substance abuse (HCC) 11/13/2006   Chronic obstructive pulmonary disease  (HCC) 11/13/2006   GERD 11/13/2006   Recurrent knee instability, right 10/28/1997   PCP:  Patient, No Pcp Per Pharmacy:   [facility] - [place] - [address] [place] [address] Phone: [phone] Fax: [phone]  [id number] - [place], [place] - [address]  [place] [address] Phone: [phone] Fax: [phone]     Social Drivers of Health (SDOH) Social History: SDOH Screenings   Food Insecurity: No Food Insecurity (03/04/2024)  Housing: Low Risk  (03/04/2024)  Transportation Needs: No Transportation Needs (03/04/2024)  Utilities: Not At Risk (03/04/2024)  Alcohol Screen: Low Risk  (06/17/2023)  Depression (PHQ2-9): Low Risk  (04/04/2022)  Social Connections: Unknown (03/04/2024)  Tobacco Use: High Risk (03/04/2024)   SDOH Interventions:     Readmission Risk Interventions    03/05/2024   10:39 AM  Readmission Risk Prevention Plan  Transportation Screening Complete  HRI or Home Care Consult Complete  Social Work Consult for Recovery Care Planning/Counseling Complete  Palliative Care Screening Not Applicable  Medication Review Oceanographer) Complete

## 2024-03-05 NOTE — Consult Note (Signed)
 Cardiology Consultation   Patient ID: Deontrey Maish Rhue MRN: [id number]; DOB: September 18, 1961  Admit date: 03/04/2024 Date of Consult: 03/05/2024  PCP:  Patient, No Pcp Per   [facility] Providers Cardiologist:  None   {  Patient Profile:   Faraaz O Stites is a 63 y.o. male with a hx of HTN, COPD, HIV infection, GERD, scoliosis, substance abuse including methamphetamines, who is being seen 03/05/2024 for the evaluation of heart failure at the request of Dr. Eilene Grater.  History of Present Illness:   Mr. Babicz was last seen by cardiology in 2011 with Dr. Stann Earnest for preventative screening.  He has been lost to follow-up since that time. No other cardiology visit per care everywhere.   Presents to ED 5/8 for LE edema and dyspnea.  ED exam noted positive JVD, 3+ LE edema up to hips, and rales in lung bases. BMP WNL except Cr 1.26, TN 64> 66, CBC WNL, A1C  5.8.  Treated with Eliquis  10 mg, ASA 324 mg, IV Dilaudid ,  IV Zofran  and IV lasix  60mg  x1.  CXR: Vascular congestion, mild edema, small left pleural effusion and mild bibasilar atelectasis. CTA: no PE, minimal pericardial effusion, mild to moderate bilateral pleural effusion, mild ascites of upper abdomen .  Venous LE US : Partial occlusive peripheral thrombus in the distal left femoral vein and popliteal vein (acute versus recannulized chronic), no evidence of right lower DVT EKG: Sinus tach, HR 116, q waves and minimal ST upsloping in V2/3, t wave inversion V1 ( no present in previous) ECHO pending.   On interview, patient was very difficult to interview due to lack of engagement, poor historian and falling asleep. Per patient, reported worsening SOB x 4 day associated with orthopnea. Also, LE edema x 1 month. Admitted to left side CP but unable to describe or quantify, not sure if exertional, positional or pleuritic. Has not taken any medication for years. Reported last drug use was over 1 month ago but refused to explain any  further. Reports  tobacco use with 48 year history. Denied any ETOH use. Also, noted that he has to leave today. Reports living at home alone and able to take care of his self. Unsure of family cardiac history.   Past Medical History:  Diagnosis Date   Anxiety    Arthritis    "I'm eat up w/it" (02/20/2017)   Asthma    Chronic back pain    "the whole back" (02/20/2017)   Chronic bronchitis (HCC)    Complication of anesthesia    "felt like I couldn't breath coming out of it"   COPD (chronic obstructive pulmonary disease) (HCC)    Depression    GERD (gastroesophageal reflux disease)    History of hiatal hernia    History of stomach ulcers    "bleeding ones; I was young then"   HIV infection (HCC) dx'd ~ 1999   Hypertension    Pneumonia    "several times" (02/20/2017)   Prolapsed disk 10/28   Pulmonary embolism (HCC) 02/20/2017   Scoliosis 08/24/13    Past Surgical History:  Procedure Laterality Date   BACK SURGERY  2019   KNEE ARTHROSCOPY Right 1980s     Home Medications:  Prior to Admission medications   Medication Sig Start Date End Date Taking? Authorizing Provider  albuterol  (VENTOLIN  HFA) 108 (90 Base) MCG/ACT inhaler Inhale 2 puffs into the lungs every 4 (four) hours as needed for wheezing or shortness of breath. Patient not taking: Reported on 03/04/2024  02/12/24   Sandie Cross, MD  atorvastatin  (LIPITOR) 10 MG tablet Take 1 tablet (10 mg total) by mouth daily. Patient not taking: Reported on 03/04/2024 12/26/23   Sandie Cross, MD  cyclobenzaprine  (FLEXERIL ) 10 MG tablet Take 10 mg by mouth 3 (three) times daily as needed for muscle spasms. Patient not taking: Reported on 03/04/2024    [provider]  diazepam  (VALIUM ) 10 MG tablet Take 10 mg by mouth in the morning and at bedtime. Patient not taking: Reported on 03/04/2024    [provider]  docusate sodium  (COLACE) 100 MG capsule Take 1 capsule (100 mg total) by mouth 2 (two) times daily. Patient not taking:  Reported on 06/16/2023 06/03/23   Silas Drivers, MD  DULoxetine  (CYMBALTA ) 60 MG capsule Take 1 capsule (60 mg total) by mouth daily. Patient not taking: Reported on 03/04/2024 12/26/23   Sandie Cross, MD  elvitegravir-cobicistat-emtricitabine -tenofovir  (GENVOYA ) 150-150-200-10 MG TABS tablet Take 1 tablet by mouth daily with breakfast. Patient not taking: Reported on 03/04/2024 12/26/23   Sandie Cross, MD  gabapentin  (NEURONTIN ) 100 MG capsule Take 2 capsules (200 mg total) by mouth 3 (three) times daily. Patient not taking: Reported on 03/04/2024 06/26/23   Juliana Ocean, DO  hydrOXYzine  (ATARAX ) 25 MG tablet Take 1 tablet (25 mg total) by mouth 3 (three) times daily as needed for anxiety. Patient not taking: Reported on 03/04/2024 12/26/23   Sandie Cross, MD  lidocaine  (LIDODERM ) 5 % Place 1 patch onto the skin daily. Remove & Discard patch within 12 hours or as directed by MD Patient not taking: Reported on 03/04/2024 06/26/23   Juliana Ocean, DO  methocarbamol  (ROBAXIN ) 500 MG tablet Take 1 tablet (500 mg total) by mouth 3 (three) times daily. Patient not taking: Reported on 03/04/2024 06/26/23   Juliana Ocean, DO  OLANZapine  (ZYPREXA ) 10 MG tablet Take 1 tablet (10 mg total) by mouth at bedtime. Patient not taking: Reported on 03/04/2024 12/26/23   Sandie Cross, MD  pantoprazole  (PROTONIX ) 40 MG tablet Take 1 tablet (40 mg total) by mouth daily. Patient not taking: Reported on 03/04/2024 06/26/23   Juliana Ocean, DO  risperiDONE  (RISPERDAL ) 0.5 MG tablet Take 1 tablet (0.5 mg total) by mouth 2 (two) times daily at 8 am and 4 pm. Patient not taking: Reported on 03/04/2024 06/26/23   Juliana Ocean, DO    Inpatient Medications: Scheduled Meds:  apixaban   10 mg Oral BID   Followed by   Cecily Cohen ON 03/11/2024] apixaban   5 mg Oral BID   empagliflozin   10 mg Oral Daily   furosemide   60 mg Intravenous Q12H   losartan   25 mg Oral Daily    pantoprazole   40 mg Oral Daily   Continuous Infusions:  PRN Meds: acetaminophen  **OR** acetaminophen , LORazepam , ondansetron  **OR** ondansetron  (ZOFRAN ) IV, oxyCODONE   Allergies:   No Known Allergies  Social History:   Social History   Socioeconomic History   Marital status: Divorced    Spouse name: Not on file   Number of children: Not on file   Years of education: Not on file   Highest education level: Not on file  Occupational History   Not on file  Tobacco Use   Smoking status: Every Day    Current packs/day: 1.00    Average packs/day: 1 pack/day for 48.0 years (48.0 ttl pk-yrs)    Types: Cigarettes   Smokeless tobacco: Never  Vaping Use   Vaping status: Never  Used  Substance and Sexual Activity   Alcohol use: Not Currently    Comment: last used about 9 years ago   Drug use: Yes    Types: Marijuana    Comment: denies   Sexual activity: Yes    Partners: Female    Comment: declined condoms  Other Topics Concern   Not on file  Social History Narrative   Not on file   Social Drivers of Health   Financial Resource Strain: Not on file  Food Insecurity: No Food Insecurity (03/04/2024)   Hunger Vital Sign    Worried About Running Out of Food in the Last Year: Never true    Ran Out of Food in the Last Year: Never true  Transportation Needs: No Transportation Needs (03/04/2024)   PRAPARE - Administrator, Civil Service (Medical): No    Lack of Transportation (Non-Medical): No  Physical Activity: Not on file  Stress: Not on file  Social Connections: Unknown (03/04/2024)   Social Connection and Isolation Panel [NHANES]    Frequency of Communication with Friends and Family: More than three times a week    Frequency of Social Gatherings with Friends and Family: More than three times a week    Attends Religious Services: Never    Database administrator or Organizations: No    Attends Banker Meetings: Never    Marital Status: Not on file  Intimate  Partner Violence: Not At Risk (03/04/2024)   Humiliation, Afraid, Rape, and Kick questionnaire    Fear of Current or Ex-Partner: No    Emotionally Abused: No    Physically Abused: No    Sexually Abused: No    Family History:   Family History  Problem Relation Age of Onset   Diabetes Father    Stroke Other    Colon cancer Neg Hx      ROS:  Please see the history of present illness.  All other ROS reviewed and negative.     Physical Exam/Data:   Vitals:   03/04/24 1848 03/04/24 1900 03/04/24 2100 03/04/24 2117  BP: (!) 166/137 (!) 135/96 (!) 130/99 116/76  Pulse: (!) 109 (!) 109 (!) 106 (!) 108  Resp: 20 16 17 20   Temp:  98 F (36.7 C) 98.1 F (36.7 C) 97.7 F (36.5 C)  TempSrc:  Oral Oral Oral  SpO2: 92% 91% 95% 92%  Weight:      Height:       No intake or output data in the 24 hours ending 03/05/24 0721    03/04/2024    2:47 PM 06/17/2023    9:20 PM 06/17/2023    2:33 PM  Last 3 Weights  Weight (lbs) 182 lb  230 lb 9.6 oz  Weight (kg) 82.555 kg  104.6 kg     Information is confidential and restricted. Go to Review Flowsheets to unlock data.     Body mass index is 24.68 kg/m.  General:  Laying in bed in no acute distress, strong odor, not well groomed  HEENT: normal Neck: + JVD Vascular: No carotid bruits; Distal pulses 2+ bilaterally Cardiac:  tachycardiac; no murmur  Lungs:  diminished in lower with rales in  bases Abd: soft, nontender, no hepatomegaly  Ext:  2 -3 + bilateral edema  Musculoskeletal:  No deformities, BUE and BLE strength normal and equal Skin: warm and dry  Neuro:  CNs 2-12 intact, no focal abnormalities noted Psych:  Normal affect   EKG:  The EKG  was personally reviewed and demonstrates:  Sinus tach, HR 116, q waves and minimal ST upsloping in V2/3, t wave inversion V1 (no present in previous) Telemetry:  Telemetry was personally reviewed and demonstrates:  sinus tachycardia, HR 100-110's, few PAC's  Relevant CV Studies: ECHO  pending  Laboratory Data:  High Sensitivity Troponin:   Recent Labs  Lab 03/04/24 1519 03/04/24 1815  TROPONINIHS 64* 66*     Chemistry Recent Labs  Lab 03/04/24 1519 03/05/24 0415  NA 136 138  K 4.9 4.0  CL 101 100  CO2 26 31  GLUCOSE 97 110*  BUN 13 15  CREATININE 1.26* 1.43*  CALCIUM  9.6 8.9  GFRNONAA >60 56*  ANIONGAP 9 7    No results for input(s): "PROT", "ALBUMIN", "AST", "ALT", "ALKPHOS", "BILITOT" in the last 168 hours. Lipids No results for input(s): "CHOL", "TRIG", "HDL", "LABVLDL", "LDLCALC", "CHOLHDL" in the last 168 hours.  Hematology Recent Labs  Lab 03/04/24 1519 03/05/24 0415  WBC 4.4 4.3  RBC 4.80 4.46  HGB 14.7 12.9*  HCT 45.3 43.1  MCV 94.4 96.6  MCH 30.6 28.9  MCHC 32.5 29.9*  RDW 12.7 12.5  PLT 185 153   Thyroid No results for input(s): "TSH", "FREET4" in the last 168 hours.  BNPNo results for input(s): "BNP", "PROBNP" in the last 168 hours.  DDimer No results for input(s): "DDIMER" in the last 168 hours.   Radiology/Studies:  CT Angio Chest Pulmonary Embolism (PE) W or WO Contrast Result Date: 03/04/2024 CLINICAL DATA:  Acute shortness of breath and right shoulder pain. EXAM: CT ANGIOGRAPHY CHEST WITH CONTRAST TECHNIQUE: Multidetector CT imaging of the chest was performed using the standard protocol during bolus administration of intravenous contrast. Multiplanar CT image reconstructions and MIPs were obtained to evaluate the vascular anatomy. RADIATION DOSE REDUCTION: This exam was performed according to the departmental dose-optimization program which includes automated exposure control, adjustment of the mA and/or kV according to patient size and/or use of iterative reconstruction technique. CONTRAST:  75mL OMNIPAQUE  IOHEXOL  350 MG/ML SOLN COMPARISON:  April 24, 2022. FINDINGS: Cardiovascular: Satisfactory opacification of the pulmonary arteries to the segmental level. No evidence of pulmonary embolism. Mild cardiomegaly. Minimal pericardial  effusion. Mediastinum/Nodes: No enlarged mediastinal, hilar, or axillary lymph nodes. Thyroid gland, trachea, and esophagus demonstrate no significant findings. Lungs/Pleura: No pneumothorax is noted. Mild to moderate bilateral pleural effusions are noted with minimal adjacent subsegmental atelectasis. Upper Abdomen: Mild ascites is noted around the liver and spleen. Musculoskeletal: Old bilateral rib fractures are noted. Review of the MIP images confirms the above findings. IMPRESSION: No definite evidence of pulmonary embolus. Mild to moderate bilateral pleural effusions are noted with adjacent minimal subsegmental atelectasis. Mild ascites is noted in visualized portion of upper abdomen. Minimal pericardial effusion. Electronically Signed   By: Rosalene Colon M.D.   On: 03/04/2024 17:40   US  Venous Img Lower Bilateral (DVT) Result Date: 03/04/2024 CLINICAL DATA:  Bilateral lower extremity edema.  History of DVTs. EXAM: BILATERAL LOWER EXTREMITY VENOUS DOPPLER ULTRASOUND TECHNIQUE: Gray-scale sonography with graded compression, as well as color Doppler and duplex ultrasound were performed to evaluate the lower extremity deep venous systems from the level of the common femoral vein and including the common femoral, femoral, profunda femoral, popliteal and calf veins including the posterior tibial, peroneal and gastrocnemius veins when visible. The superficial great saphenous vein was also interrogated. Spectral Doppler was utilized to evaluate flow at rest and with distal augmentation maneuvers in the common femoral, femoral and popliteal veins.  COMPARISON:  04/07/2019 FINDINGS: RIGHT LOWER EXTREMITY Common Femoral Vein: No evidence of thrombus. Normal compressibility, respiratory phasicity and response to augmentation. Saphenofemoral Junction: No evidence of thrombus. Normal compressibility and flow on color Doppler imaging. Profunda Femoral Vein: No evidence of thrombus. Normal compressibility and flow on  color Doppler imaging. Femoral Vein: No evidence of thrombus. Normal compressibility, respiratory phasicity and response to augmentation. Popliteal Vein: No evidence of thrombus. Normal compressibility, respiratory phasicity and response to augmentation. Calf Veins: No evidence of thrombus. Normal compressibility and flow on color Doppler imaging. Superficial Great Saphenous Vein: No evidence of thrombus. Normal compressibility. Venous Reflux:  None. Other Findings:  None. LEFT LOWER EXTREMITY Common Femoral Vein: No evidence of thrombus. Normal compressibility, respiratory phasicity and response to augmentation. Saphenofemoral Junction: No evidence of thrombus. Normal compressibility and flow on color Doppler imaging. Profunda Femoral Vein: No evidence of thrombus. Normal compressibility and flow on color Doppler imaging. Femoral Vein: Peripheral nonocclusive thrombus noted in the distal left femoral vein. Popliteal Vein: Peripheral nonocclusive thrombus within the left popliteal vein. Calf Veins: No evidence of thrombus. Normal compressibility and flow on color Doppler imaging. Superficial Great Saphenous Vein: No evidence of thrombus. Normal compressibility. Venous Reflux:  None. Other Findings:  None. IMPRESSION: Partially occlusive peripheral thrombus in the distal left femoral vein and popliteal vein. This could be partially occlusive acute thrombus or recanalized chronic thrombus. No evidence of right lower extremity DVT. Electronically Signed   By: Janeece Mechanic M.D.   On: 03/04/2024 17:27   DG Chest Port 1 View Result Date: 03/04/2024 CLINICAL DATA:  Shortness of breath, right shoulder pain and swelling in both legs. Patient fell at home 2-3 days prior. EXAM: PORTABLE CHEST 1 VIEW COMPARISON:  Radiographs 06/22/2023 and 10/17/2022.  CT 04/24/2022. FINDINGS: 1527 hours. Lower lung volumes with mild patient rotation. The heart size and mediastinal contours are stable. There is increased vascular congestion  with possible mild edema, a small left pleural effusion and probable mild bibasilar atelectasis. No confluent airspace disease or pneumothorax. No acute osseous findings are evident. Telemetry leads overlie the chest. IMPRESSION: Lower lung volumes with increased vascular congestion and possible mild edema. Small left pleural effusion and probable mild bibasilar atelectasis. Electronically Signed   By: Elmon Hagedorn M.D.   On: 03/04/2024 15:38     Assessment and Plan:   New Acute heart failure - Presented to ED with concerns of worsening SOB, LE edema and orthopnea. Treated with IV Lasix  60 mg x 1. No I/O recorded. No follow up weights. BNP pending. ECHO pending - CXR: Vascular congestion, mild edema, small left pleural effusion and mild bibasilar atelectasis.  - Cr 1.43 (baseline 0.9 -1.2), K 4, TSH pending  - Appear volume overloaded on exam. Continue diuresis with IV lasix  60 mg BID. - Recommended close follow of I/O, weight and renal function  - Scheduled to start losartan  25 mg and Jardiance  10 mg. Can reconsider additional GDMT based on ECHO results.   Elevated troponin - Reported vague CP, however, not willing to elaborate when asking questions. TN flat 64 > 66. No obvious ischemic changes on EKG  - less concern for ischemia but can reconsider once ECHO results.  - could be 2/2 to demand ischemia and tachycardia - can consider outpatient ischemic evaluation if compliant  Sinus tachycardia  - Telemetry: Sinus tachycardia, HR 100-110's with PAC's  - CTA with no PE, CBC/K WNL, TSH pending - could be 2/2 to drug use and/or acute DVT  HTN - BP currently stabilized. Will continue to monitor.  - Continue losartan  as above   Acute DVT  - Venous LE US : Partial occlusive peripheral thrombus in the distal left femoral vein and popliteal vein (acute versus recannulized chronic), no evidence of right lower DVT - Currently on Eliquis  10 mg BID x 7 day then Eliquis  5 mg BID. Continue  Eliquis .   Pericardial effusion  - CTA: minimal pericardial effusion - + JVD, no distant heart sounds,  no hypotension, no friction rub, reported CP but not willing to explain.   - Not in cardiac tamponade via exam but can reconsider based on ECHO results.  - Can consider ordering ESR & CRP if large pericardial effusion by ECHO   Substance use - Previous UDS 2024  positive for amphetamine, benzodiazepine, and opiates - Suspect more recent drug use but reported last use of drugs approx 1 m/o ago. Not willing to explain any further. -  UDS pending    Risk Assessment/Risk Scores:   [place]  Heart Association (NYHA) Functional Class NYHA Class III  For questions or updates, please contact [facility] Please consult [email or web address] for contact info under    Signed, Metta Actis, PA-C  03/05/2024 7:21 AM

## 2024-03-05 NOTE — Progress Notes (Signed)
 Talked with daughter. He is stating that her dad hallucint\ates all the time, is paranoid, ives in a filthy, unkept home. She states that he takes meth, cocaine, fentanyl and other drugs. She says that she does not visit him because he can be violent and unpredictable. I sent a note to Dr. Sunnie England to update him on her concern.

## 2024-03-05 NOTE — Hospital Course (Addendum)
 Devin [name] is a 63 y.o. male with medical history significant of COPD, hypertension, history of pulmonary embolism, HIV infection and scoliosis who presented with lower extremity edema and dyspnea.  Patient reported 4 weeks of worsening lower extremity edema, but more severe over the last 2 days. It has been associated with exertional dyspnea but not frank angina symptoms.  Last night he had a episode of severe episode of paroxysmal nocturnal dyspnea. This morning while in the social security office he was noted in distress, prompting the personal to call EMS.  Patient was found tachycardic and in distress, then decision was made to transport him to the ED.    He sustained a fall 2 days ago, hitting his right shoulder, since his pain has been constant.  Apparently he is not taking any medications and has not seen his primary care provider in a long time.    Reports using methamphetamines for pain over last several weeks.   At the time of my examination his dyspnea is moderate in intensity and is worse with movement, no improving factors.        Assessment & Plan:   Principal Problem:   Acute heart failure (HCC) Active Problems:   Essential hypertension, benign   Human immunodeficiency virus (HIV) disease (HCC)   GERD   Substance abuse (HCC)   History of pulmonary embolism   Acute exacerbation of CHF (congestive heart failure) (HCC)    Assessment and Plan:  * Acute heart failure (HCC) Patient with signs of volume overload.  - Requiring 2.5 L of oxygen, satting 92%, -ENP 1823.0, creatinine 1.43,   -Cardiology consulted, switched IV Lasix  to drip, Hold GDMT due to elevated creatinine Add SGLT 2 inh and ARB.  Echocardiogram.   Monitoring I's and O's and daily weight No intake or output data in the 24 hours ending 03/05/24 1022 REDsClip   Troponin elevation  - Denies of any chest pain, no EKG changes -Troponin remained flat 64, 66, possible due to heart failure  exacerbation, currently no signs of acute coronary syndrome.    AKI  Serum creatinine 1.2>> 1.4 Likely due to diuretics,  Avoid nephrotoxins, holding SGL 2 inhibitors and ARB for now  Essential hypertension, benign  -Monitoring BP -Holding ARB's    Human immunodeficiency virus (HIV) disease (HCC) Need to check old records and follow up with ID    GERD  - Continue pantoprazole     Substance abuse (HCC)-with hallucination and paranoia Patient has been using methamphetamines.  Positive anxiety, will add as needed lorazepam .    History of alcohol abuse, with no signs of acute withdrawal.   -Consulted psych for evaluation and recommendations    Right shoulder pain, continue oral analgesics    History of pulmonary embolism Left non occlusive thrombus, possible acute vs recannulization -CTA-negative for pulmonary embolism prior history of PE will resume anticoagulation

## 2024-03-05 NOTE — Progress Notes (Addendum)
 Addendum: Transfer to [facility]  Contacted by cardiology Dr. Arthea Larsson  Who  just read the echo, EF < 20%    Discussed with the cardiology team-patient is to transfer to [facility] for heart failure team to come on board for optimizing his medical management and further workup as it may be needed.    Per cardiology on IV Lasix  gtt ... Tapering up due to poor urine output.   Cardiology will request heart failure team to come on board at [facility]   Transferring to [facility]ist team.  Heart failure team will consulted.    SIGNED: Bobbetta Burnet, MD, FHM. FAAFP [facility],  Pager (please use [email or web address] to page/text)  Please use Epic Secure Chat for non-urgent communication (7AM-7PM) If 7PM-7AM, please contact night-coverage [email or web address],  03/05/2024, 4:33 PM

## 2024-03-05 NOTE — BH Assessment (Signed)
 It appears this patient has an extensive PMHx significant for substance abuse issues. Since his current presentation may be associated with psychoactive substances we will have to wait until his UDS results before we can make a disposition. We will see as soon as those results are made available. EDP was advised who reported they will put in a stat UDS.

## 2024-03-05 NOTE — Progress Notes (Signed)
 PROGRESS NOTE    Patient: Devin [name]                            PCP: Patient, No Pcp Per                    DOB: Mar 02, 1961            DOA: 03/04/2024 EAV:[id number]             DOS: 03/05/2024, 10:27 AM   LOS: 0 days   Date of Service: The patient was seen and examined on 03/05/2024  Subjective:   The patient was seen and examined this morning. Hemodynamically stable. No issues overnight .  Brief Narrative:   Devin [name] is a 63 y.o. male with medical history significant of COPD, hypertension, history of pulmonary embolism, HIV infection and scoliosis who presented with lower extremity edema and dyspnea.  Patient reported 4 weeks of worsening lower extremity edema, but more severe over the last 2 days. It has been associated with exertional dyspnea but not frank angina symptoms.  Last night he had a episode of severe episode of paroxysmal nocturnal dyspnea. This morning while in the social security office he was noted in distress, prompting the personal to call EMS.  Patient was found tachycardic and in distress, then decision was made to transport him to the ED.    He sustained a fall 2 days ago, hitting his right shoulder, since his pain has been constant.  Apparently he is not taking any medications and has not seen his primary care provider in a long time.    Reports using methamphetamines for pain over last several weeks.   At the time of my examination his dyspnea is moderate in intensity and is worse with movement, no improving factors.        Assessment & Plan:   Principal Problem:   Acute heart failure (HCC) Active Problems:   Essential hypertension, benign   Human immunodeficiency virus (HIV) disease (HCC)   GERD   Substance abuse (HCC)   History of pulmonary embolism   Acute exacerbation of CHF (congestive heart failure) (HCC)    Assessment and Plan:  * Acute heart failure (HCC) Patient with signs of volume overload.  - Requiring 2.5 L of oxygen, satting  92%, -ENP 1823.0, creatinine 1.43,   -Cardiology consulted, switched IV Lasix  to drip, Hold GDMT due to elevated creatinine Add SGLT 2 inh and ARB.  Echocardiogram.   Monitoring I's and O's and daily weight No intake or output data in the 24 hours ending 03/05/24 1022 REDsClip   Troponin elevation  - Denies of any chest pain, no EKG changes -Troponin remained flat 64, 66, possible due to heart failure exacerbation, currently no signs of acute coronary syndrome.    AKI  Serum creatinine 1.2>> 1.4 Likely due to diuretics,  Avoid nephrotoxins, holding SGL 2 inhibitors and ARB for now  Essential hypertension, benign  -Monitoring BP -Holding ARB's    Human immunodeficiency virus (HIV) disease (HCC) Need to check old records and follow up with ID    GERD  - Continue pantoprazole     Substance abuse (HCC)-with hallucination and paranoia Patient has been using methamphetamines.  Positive anxiety, will add as needed lorazepam .    History of alcohol abuse, with no signs of acute withdrawal.   -Consulted psych for evaluation and recommendations    Right shoulder pain, continue oral analgesics  History of pulmonary embolism Left non occlusive thrombus, possible acute vs recannulization -CTA-negative for pulmonary embolism prior history of PE will resume anticoagulation                    ---------------------------------------------------------------------------------------------------------------------- Nutritional status:  The patient's BMI is: Body mass index is 24.68 kg/m. I agree with the assessment and plan as outlined below: Nutrition Status:           Skin Assessment: I have examined the patient's skin and I agree with the wound assessment as performed by wound care team  ------------------------------------------------------------------------------------------------------------------  DVT prophylaxis:  SCDs Start: 03/04/24 1935 apixaban   (ELIQUIS ) tablet 10 mg  apixaban  (ELIQUIS ) tablet 5 mg   Code Status:   Code Status: Full Code  Family Communication: No family member present at bedside- attempt will be  -Advance care planning has been discussed.   Admission status:   Status is: Inpatient Remains inpatient appropriate because: Needing IV diuretics, 2D echocardiogram, cardiac evaluation   Disposition: From  - home             Planning for discharge in 1-2 days: to   Procedures:   No admission procedures for hospital encounter.   Antimicrobials:  Anti-infectives (From admission, onward)    None        Medication:   apixaban   10 mg Oral BID   Followed by   Cecily Cohen ON 03/11/2024] apixaban   5 mg Oral BID   pantoprazole   40 mg Oral Daily    acetaminophen  **OR** acetaminophen , LORazepam , ondansetron  **OR** ondansetron  (ZOFRAN ) IV, oxyCODONE    Objective:   Vitals:   03/04/24 1848 03/04/24 1900 03/04/24 2100 03/04/24 2117  BP: (!) 166/137 (!) 135/96 (!) 130/99 116/76  Pulse: (!) 109 (!) 109 (!) 106 (!) 108  Resp: 20 16 17 20   Temp:  98 F (36.7 C) 98.1 F (36.7 C) 97.7 F (36.5 C)  TempSrc:  Oral Oral Oral  SpO2: 92% 91% 95% 92%  Weight:      Height:       No intake or output data in the 24 hours ending 03/05/24 1027 Filed Weights   03/04/24 1447  Weight: 82.6 kg     Physical examination:   Constitution:  Alert, cooperative, no distress,  Appears calm and comfortable  Psychiatric:   Normal and stable mood and affect, cognition intact,   HEENT:        Normocephalic, PERRL, otherwise with in Normal limits  Chest:         Chest symmetric Cardio vascular:  S1/S2, RRR, No murmure, No Rubs or Gallops  pulmonary: Clear to auscultation bilaterally, respirations unlabored, negative wheezes / crackles Abdomen: Soft, non-tender, non-distended, bowel sounds,no masses, no organomegaly Muscular skeletal: Limited exam - in bed, able to move all 4 extremities,   Neuro: CNII-XII intact. , normal motor  and sensation, reflexes intact  Extremities: +2 pitting edema lower extremities, +2 pulses  Skin: Dry, warm to touch, negative for any Rashes, No open wounds Wounds: per nursing documentation   ------------------------------------------------------------------------------------------------------------------------------------------    LABs:     Latest Ref Rng & Units 03/05/2024    4:15 AM 03/04/2024    3:19 PM 06/13/2023    3:38 PM  CBC  WBC 4.0 - 10.5 K/uL 4.3  4.4  7.2   Hemoglobin 13.0 - 17.0 g/dL 16.1  09.6  04.5   Hematocrit 39.0 - 52.0 % 43.1  45.3  40.2   Platelets 150 - 400 K/uL 153  185  257       Latest Ref Rng & Units 03/05/2024    4:15 AM 03/04/2024    3:19 PM 06/13/2023    3:38 PM  CMP  Glucose 70 - 99 mg/dL 161  97  096   BUN 6 - 20 mg/dL 15  13  9    Creatinine 0.61 - 1.24 mg/dL 0.45  4.09  8.11   Sodium 135 - 145 mmol/L 138  136  140   Potassium 3.5 - 5.1 mmol/L 4.0  4.9  3.6   Chloride 98 - 111 mmol/L 100  101  103   CO2 22 - 32 mmol/L 31  26  26    Calcium  8.9 - 10.3 mg/dL 8.9  9.6  9.7   Total Protein 6.5 - 8.1 g/dL   7.3   Total Bilirubin 0.3 - 1.2 mg/dL   0.8   Alkaline Phos 38 - 126 U/L   63   AST 15 - 41 U/L   20   ALT 0 - 44 U/L   15        Micro Results No results found for this or any previous visit (from the past 240 hours).  Radiology Reports CT Angio Chest Pulmonary Embolism (PE) W or WO Contrast Result Date: 03/04/2024 CLINICAL DATA:  Acute shortness of breath and right shoulder pain. EXAM: CT ANGIOGRAPHY CHEST WITH CONTRAST TECHNIQUE: Multidetector CT imaging of the chest was performed using the standard protocol during bolus administration of intravenous contrast. Multiplanar CT image reconstructions and MIPs were obtained to evaluate the vascular anatomy. RADIATION DOSE REDUCTION: This exam was performed according to the departmental dose-optimization program which includes automated exposure control, adjustment of the mA and/or kV according to  patient size and/or use of iterative reconstruction technique. CONTRAST:  75mL OMNIPAQUE  IOHEXOL  350 MG/ML SOLN COMPARISON:  April 24, 2022. FINDINGS: Cardiovascular: Satisfactory opacification of the pulmonary arteries to the segmental level. No evidence of pulmonary embolism. Mild cardiomegaly. Minimal pericardial effusion. Mediastinum/Nodes: No enlarged mediastinal, hilar, or axillary lymph nodes. Thyroid gland, trachea, and esophagus demonstrate no significant findings. Lungs/Pleura: No pneumothorax is noted. Mild to moderate bilateral pleural effusions are noted with minimal adjacent subsegmental atelectasis. Upper Abdomen: Mild ascites is noted around the liver and spleen. Musculoskeletal: Old bilateral rib fractures are noted. Review of the MIP images confirms the above findings. IMPRESSION: No definite evidence of pulmonary embolus. Mild to moderate bilateral pleural effusions are noted with adjacent minimal subsegmental atelectasis. Mild ascites is noted in visualized portion of upper abdomen. Minimal pericardial effusion. Electronically Signed   By: Rosalene Colon M.D.   On: 03/04/2024 17:40   US  Venous Img Lower Bilateral (DVT) Result Date: 03/04/2024 CLINICAL DATA:  Bilateral lower extremity edema.  History of DVTs. EXAM: BILATERAL LOWER EXTREMITY VENOUS DOPPLER ULTRASOUND TECHNIQUE: Gray-scale sonography with graded compression, as well as color Doppler and duplex ultrasound were performed to evaluate the lower extremity deep venous systems from the level of the common femoral vein and including the common femoral, femoral, profunda femoral, popliteal and calf veins including the posterior tibial, peroneal and gastrocnemius veins when visible. The superficial great saphenous vein was also interrogated. Spectral Doppler was utilized to evaluate flow at rest and with distal augmentation maneuvers in the common femoral, femoral and popliteal veins. COMPARISON:  04/07/2019 FINDINGS: RIGHT LOWER EXTREMITY  Common Femoral Vein: No evidence of thrombus. Normal compressibility, respiratory phasicity and response to augmentation. Saphenofemoral Junction: No evidence of thrombus. Normal compressibility and flow on color Doppler imaging. Profunda  Femoral Vein: No evidence of thrombus. Normal compressibility and flow on color Doppler imaging. Femoral Vein: No evidence of thrombus. Normal compressibility, respiratory phasicity and response to augmentation. Popliteal Vein: No evidence of thrombus. Normal compressibility, respiratory phasicity and response to augmentation. Calf Veins: No evidence of thrombus. Normal compressibility and flow on color Doppler imaging. Superficial Great Saphenous Vein: No evidence of thrombus. Normal compressibility. Venous Reflux:  None. Other Findings:  None. LEFT LOWER EXTREMITY Common Femoral Vein: No evidence of thrombus. Normal compressibility, respiratory phasicity and response to augmentation. Saphenofemoral Junction: No evidence of thrombus. Normal compressibility and flow on color Doppler imaging. Profunda Femoral Vein: No evidence of thrombus. Normal compressibility and flow on color Doppler imaging. Femoral Vein: Peripheral nonocclusive thrombus noted in the distal left femoral vein. Popliteal Vein: Peripheral nonocclusive thrombus within the left popliteal vein. Calf Veins: No evidence of thrombus. Normal compressibility and flow on color Doppler imaging. Superficial Great Saphenous Vein: No evidence of thrombus. Normal compressibility. Venous Reflux:  None. Other Findings:  None. IMPRESSION: Partially occlusive peripheral thrombus in the distal left femoral vein and popliteal vein. This could be partially occlusive acute thrombus or recanalized chronic thrombus. No evidence of right lower extremity DVT. Electronically Signed   By: Janeece Mechanic M.D.   On: 03/04/2024 17:27   DG Chest Port 1 View Result Date: 03/04/2024 CLINICAL DATA:  Shortness of breath, right shoulder pain and  swelling in both legs. Patient fell at home 2-3 days prior. EXAM: PORTABLE CHEST 1 VIEW COMPARISON:  Radiographs 06/22/2023 and 10/17/2022.  CT 04/24/2022. FINDINGS: 1527 hours. Lower lung volumes with mild patient rotation. The heart size and mediastinal contours are stable. There is increased vascular congestion with possible mild edema, a small left pleural effusion and probable mild bibasilar atelectasis. No confluent airspace disease or pneumothorax. No acute osseous findings are evident. Telemetry leads overlie the chest. IMPRESSION: Lower lung volumes with increased vascular congestion and possible mild edema. Small left pleural effusion and probable mild bibasilar atelectasis. Electronically Signed   By: Elmon Hagedorn M.D.   On: 03/04/2024 15:38    SIGNED: Bobbetta Burnet, MD, FHM. FAAFP. Arlin Benes - [facility] Time spent - 55 min.  In seeing, evaluating and examining the patient. Reviewing medical records, labs, drawn plan of care. [facility],  Pager (please use [email or web address] to page/ text) Please use Epic Secure Chat for non-urgent communication (7AM-7PM)  If 7PM-7AM, please contact night-coverage [email or web address], 03/05/2024, 10:27 AM

## 2024-03-05 NOTE — Progress Notes (Signed)
 PHARMACY - ANTICOAGULATION CONSULT NOTE  Pharmacy Consult for apixaban  Indication: DVT  No Known Allergies  Patient Measurements: Height: 6' (182.9 cm) Weight: 82.6 kg (182 lb) IBW/kg (Calculated) : 77.6 HEPARIN  DW (KG): 82.6  Vital Signs: Temp: 97.7 F (36.5 C) (05/08 2117) Temp Source: Oral (05/08 2117) BP: 116/76 (05/08 2117) Pulse Rate: 108 (05/08 2117)  Labs: Recent Labs    03/04/24 1519 03/04/24 1815  HGB 14.7  --   HCT 45.3  --   PLT 185  --   CREATININE 1.26*  --   TROPONINIHS 64* 66*    Estimated Creatinine Clearance: 68.4 mL/min (A) (by C-G formula based on SCr of 1.26 mg/dL (H)).   Medical History: Past Medical History:  Diagnosis Date   Anxiety    Arthritis    "I'm eat up w/it" (02/20/2017)   Asthma    Chronic back pain    "the whole back" (02/20/2017)   Chronic bronchitis (HCC)    Complication of anesthesia    "felt like I couldn't breath coming out of it"   COPD (chronic obstructive pulmonary disease) (HCC)    Depression    GERD (gastroesophageal reflux disease)    History of hiatal hernia    History of stomach ulcers    "bleeding ones; I was young then"   HIV infection (HCC) dx'd ~ 1999   Hypertension    Pneumonia    "several times" (02/20/2017)   Prolapsed disk 10/28   Pulmonary embolism (HCC) 02/20/2017   Scoliosis 08/24/13   Assessment: 62 yoM presented to ED with shortness of breath and lower extremity edema. PMH includes: COPD, HTN, hx of PE (reportedly completed course of eliquis ), HIV. Pharmacy consulted to dose apixaban  for DVT  Lower extremity US  shows partial occlusive peripheral thrombus in the distal left femoral vein and popliteal vein; partially acute thrombus vs recanalized chronic thrombus   Noted was recently discussed with primary care to stop apixaban  previously in past by heme and prescription was canceled at pharmacy in 11/2023. Has received 1x 10mg  PO dose in ED  Goal of Therapy:  Monitor platelets by  anticoagulation protocol: Yes   Plan:  -Apixaban  10mg  PO BID for 7 days, followed by 5mg  PO BID -Monitor CBC daily  Young Hensen, PharmD. Clinical Pharmacist 03/05/2024 12:30 AM

## 2024-03-05 NOTE — Plan of Care (Signed)

## 2024-03-05 NOTE — Progress Notes (Signed)
 Transition of Care Department [facility]) has reviewed patient and no other TOC needs have been identified at this time. We will continue to monitor patient advancement through interdisciplinary progression rounds. If new patient transition needs arise, please place a TOC consult.   03/05/24 0808  TOC Brief Assessment  Insurance and Status Reviewed  Patient has primary care physician No (Will add PCP list to AVS.)  Home environment has been reviewed Lives alone.  Prior level of function: Some difficulty. Pt states he will return home at d/c and not interested in placement.  Prior/Current Home Services No current home services  Social Drivers of Health Review SDOH reviewed no interventions necessary  Readmission risk has been reviewed Yes  Transition of care needs no transition of care needs at this time

## 2024-03-05 NOTE — Discharge Instructions (Signed)
 Providers Accepting New Patients in [place], [place]    [facility] [address]  [place], [place] [address] [phone] Accepts most insurances  [facility] [address] [place], [place] [address] [phone] Accepts most insurances  [facility] [address] [place], [place] [address]  [phone] Must meet requirements  [facility] [address] [place], [place] [address] [phone] Accepts most insurances  [facility] [address]  [place], [place] [address] [phone] Accepts most insurances  [facility] [address]   [place], [place]   [phone] Accepts most insurances  [facility])  [phone] [address]  [place], [place] [address] [phone] Accepts most insurances     [facility] [address]  [place], [place] [address] [phone] Accepts most insurances  [facility] [address] [place], [place] [address] [phone] option 1 Accepts Medicaid and [facility] [address]  [place], [place] [address] [phone] Accepts most insurances  Clary Crown, MD [address] [place], [place] [address] [phone] Accepts most insurances  [facility] [address]  [place], [place] [address] [phone] Accepts most insurances  [facility] [phone] [address] [place], [place] [address] [phone] Accepts most insurances  [place] [place] [address] [place], [place] [address] [phone]  Accepts most insurances     Information on my medicine - ELIQUIS  (apixaban )  Why was Eliquis  prescribed for you? Eliquis  was prescribed for you to reduce the risk of a blood clot forming that can cause a stroke if you have a medical condition called atrial fibrillation (a type of  irregular heartbeat).  What do You need to know about Eliquis  ? Take your Eliquis  TWICE DAILY - one tablet in the morning and one tablet in the evening with or without food. If you have difficulty swallowing the tablet whole please discuss with your pharmacist how to take the medication safely.  Take Eliquis  exactly as prescribed by your doctor and DO NOT stop taking Eliquis  without talking to the doctor who prescribed the medication.  Stopping may increase your risk of developing a stroke.  Refill your prescription before you run out.  After discharge, you should have regular check-up appointments with your healthcare provider that is prescribing your Eliquis .  In the future your dose may need to be changed if your kidney function or weight changes by a significant amount or as you get older.  What do you do if you miss a dose? If you miss a dose, take it as soon as you remember on the same day and resume taking twice daily.  Do not take more than one dose of ELIQUIS  at the same time to make up a missed dose.  Important Safety Information A possible side effect of Eliquis  is bleeding. You should call your healthcare provider right away if you experience any of the following: Bleeding from an injury or your nose that does not stop. Unusual colored urine (red or dark brown) or unusual colored stools (red or black). Unusual bruising for unknown reasons. A serious fall or if you hit your head (even if there is no bleeding).  Some medicines may interact with Eliquis  and might increase your risk of bleeding or clotting while on Eliquis . To help avoid this, consult your healthcare provider or pharmacist prior to using  any new prescription or non-prescription medications, including herbals, vitamins, non-steroidal anti-inflammatory drugs (NSAIDs) and supplements.  This website has more information on Eliquis  (apixaban ): [email or web address] Devin [name]

## 2024-03-05 NOTE — Progress Notes (Addendum)
 PHARMACY - ANTICOAGULATION CONSULT NOTE  Pharmacy Consult for Eliquis  Indication: DVT  No Known Allergies  Patient Measurements: Height: 6' (182.9 cm) Weight: 82.6 kg (182 lb) IBW/kg (Calculated) : 77.6 HEPARIN  DW (KG): 82.6  Vital Signs:    Labs: Recent Labs    03/04/24 1519 03/04/24 1815 03/05/24 0415  HGB 14.7  --  12.9*  HCT 45.3  --  43.1  PLT 185  --  153  CREATININE 1.26*  --  1.43*  TROPONINIHS 64* 66*  --     Estimated Creatinine Clearance: 60.3 mL/min (A) (by C-G formula based on SCr of 1.43 mg/dL (H)).   Medical History: Past Medical History:  Diagnosis Date   Anxiety    Arthritis    "I'm eat up w/it" (02/20/2017)   Asthma    Chronic back pain    "the whole back" (02/20/2017)   Chronic bronchitis (HCC)    Complication of anesthesia    "felt like I couldn't breath coming out of it"   COPD (chronic obstructive pulmonary disease) (HCC)    Depression    GERD (gastroesophageal reflux disease)    History of hiatal hernia    History of stomach ulcers    "bleeding ones; I was young then"   HIV infection (HCC) dx'd ~ 1999   Hypertension    Pneumonia    "several times" (02/20/2017)   Prolapsed disk 10/28   Pulmonary embolism (HCC) 02/20/2017   Scoliosis 08/24/13     Assessment: Patient with acute DVT- previously on Eliquis  for PE but stopped 11/2023. Patient is also on Genvoya - strong CYP3A4 inhibitor.  Patient would either need reduced dose of Eliquis  (50% reduction)  or change to warfarin.  MD would like to give reduced dose rather than warfarin  Goal of Therapy:   Monitor platelets by anticoagulation protocol: Yes   Plan:  Due to drug-drug interaction between Eliquis  and Genvoya - will give 50% reduced dose of Eliquis .  Eliquis  5 mg twice daily x 7 days followed by apixaban  5 mg twice daily thereafter.  Cliffton Dama, PharmD Clinical Pharmacist 03/05/2024 1:55 PM

## 2024-03-06 ENCOUNTER — Other Ambulatory Visit: Payer: Self-pay

## 2024-03-06 DIAGNOSIS — I5031 Acute diastolic (congestive) heart failure: Secondary | ICD-10-CM | POA: Diagnosis not present

## 2024-03-06 DIAGNOSIS — I10 Essential (primary) hypertension: Secondary | ICD-10-CM | POA: Diagnosis not present

## 2024-03-06 DIAGNOSIS — I509 Heart failure, unspecified: Secondary | ICD-10-CM

## 2024-03-06 DIAGNOSIS — I82409 Acute embolism and thrombosis of unspecified deep veins of unspecified lower extremity: Secondary | ICD-10-CM | POA: Insufficient documentation

## 2024-03-06 DIAGNOSIS — F22 Delusional disorders: Secondary | ICD-10-CM

## 2024-03-06 DIAGNOSIS — B20 Human immunodeficiency virus [HIV] disease: Secondary | ICD-10-CM | POA: Diagnosis not present

## 2024-03-06 DIAGNOSIS — F329 Major depressive disorder, single episode, unspecified: Secondary | ICD-10-CM

## 2024-03-06 DIAGNOSIS — F1411 Cocaine abuse, in remission: Secondary | ICD-10-CM

## 2024-03-06 LAB — BASIC METABOLIC PANEL WITH GFR
Anion gap: 12 (ref 5–15)
Anion gap: 14 (ref 5–15)
BUN: 12 mg/dL (ref 6–20)
BUN: 15 mg/dL (ref 6–20)
CO2: 33 mmol/L — ABNORMAL HIGH (ref 22–32)
CO2: 35 mmol/L — ABNORMAL HIGH (ref 22–32)
Calcium: 8.8 mg/dL — ABNORMAL LOW (ref 8.9–10.3)
Calcium: 9.4 mg/dL (ref 8.9–10.3)
Chloride: 95 mmol/L — ABNORMAL LOW (ref 98–111)
Chloride: 89 mmol/L — ABNORMAL LOW (ref 98–111)
Creatinine, Ser: 1.36 mg/dL — ABNORMAL HIGH (ref 0.61–1.24)
Creatinine, Ser: 1.5 mg/dL — ABNORMAL HIGH (ref 0.61–1.24)
GFR, Estimated: 60 mL/min — ABNORMAL LOW (ref >60)
GFR, Estimated: 53 mL/min — ABNORMAL LOW (ref >60)
Glucose, Bld: 107 mg/dL — ABNORMAL HIGH (ref 70–99)
Glucose, Bld: 225 mg/dL — ABNORMAL HIGH (ref 70–99)
Potassium: 3.4 mmol/L — ABNORMAL LOW (ref 3.5–5.1)
Potassium: 3 mmol/L — ABNORMAL LOW (ref 3.5–5.1)
Sodium: 140 mmol/L (ref 135–145)
Sodium: 138 mmol/L (ref 135–145)

## 2024-03-06 LAB — COOXEMETRY PANEL
Carboxyhemoglobin: 2.3 % — ABNORMAL HIGH (ref 0.5–1.5)
Methemoglobin: <0.7 % (ref 0.0–1.5)
O2 Saturation: 62.1 %
Total hemoglobin: 15.2 g/dL (ref 12.0–16.0)

## 2024-03-06 LAB — CBC WITH DIFFERENTIAL/PLATELET
Abs Immature Granulocytes: 0.02 10*3/uL (ref 0.00–0.07)
Basophils Absolute: 0.1 10*3/uL (ref 0.0–0.1)
Basophils Relative: 1 %
Eosinophils Absolute: 0.1 10*3/uL (ref 0.0–0.5)
Eosinophils Relative: 2 %
HCT: 48.1 % (ref 39.0–52.0)
Hemoglobin: 15.5 g/dL (ref 13.0–17.0)
Immature Granulocytes: 0 %
Lymphocytes Relative: 26 %
Lymphs Abs: 1.6 10*3/uL (ref 0.7–4.0)
MCH: 29 pg (ref 26.0–34.0)
MCHC: 32.2 g/dL (ref 30.0–36.0)
MCV: 89.9 fL (ref 80.0–100.0)
Monocytes Absolute: 0.6 10*3/uL (ref 0.1–1.0)
Monocytes Relative: 9 %
Neutro Abs: 3.7 10*3/uL (ref 1.7–7.7)
Neutrophils Relative %: 62 %
Platelets: 191 10*3/uL (ref 150–400)
RBC: 5.35 MIL/uL (ref 4.22–5.81)
RDW: 12.6 % (ref 11.5–15.5)
WBC: 6 10*3/uL (ref 4.0–10.5)
nRBC: 0 % (ref 0.0–0.2)

## 2024-03-06 LAB — BRAIN NATRIURETIC PEPTIDE: B Natriuretic Peptide: 2092.4 pg/mL — ABNORMAL HIGH (ref 0.0–100.0)

## 2024-03-06 LAB — T4, FREE
Free T4: 0.79 ng/dL (ref 0.61–1.12)
Free T4: 0.8 ng/dL (ref 0.61–1.12)

## 2024-03-06 LAB — MAGNESIUM: Magnesium: 1.7 mg/dL (ref 1.7–2.4)

## 2024-03-06 MED ORDER — SODIUM CHLORIDE 0.9% FLUSH
10.0000 mL | Freq: Two times a day (BID) | INTRAVENOUS | Status: DC
  Administered 2024-03-07 – 2024-03-11 (×9): 10 mL

## 2024-03-06 MED ORDER — HEPARIN (PORCINE) 25000 UT/250ML-% IV SOLN
1600.0000 [IU]/h | INTRAVENOUS | Status: DC
  Administered 2024-03-06 – 2024-03-07 (×2): 1400 [IU]/h via INTRAVENOUS
  Filled 2024-03-06 (×2): qty 250

## 2024-03-06 MED ORDER — SODIUM CHLORIDE 0.9% FLUSH: 10.0000 mL | INTRAVENOUS | Status: DC | PRN

## 2024-03-06 MED ORDER — DIGOXIN 125 MCG PO TABS
0.1250 mg | ORAL_TABLET | Freq: Every day | ORAL | Status: DC
  Administered 2024-03-06 – 2024-03-11 (×6): 0.125 mg via ORAL
  Filled 2024-03-06 (×6): qty 1

## 2024-03-06 MED ORDER — CHLORHEXIDINE GLUCONATE CLOTH 2 % EX PADS
6.0000 | MEDICATED_PAD | Freq: Every day | CUTANEOUS | Status: DC
  Administered 2024-03-07 – 2024-03-11 (×5): 6 via TOPICAL

## 2024-03-06 MED ORDER — MAGNESIUM SULFATE 2 GM/50ML IV SOLN
2.0000 g | Freq: Once | INTRAVENOUS | Status: AC
  Administered 2024-03-06: 2 g via INTRAVENOUS
  Filled 2024-03-06: qty 50

## 2024-03-06 MED ORDER — POTASSIUM CHLORIDE CRYS ER 20 MEQ PO TBCR
40.0000 meq | EXTENDED_RELEASE_TABLET | Freq: Once | ORAL | Status: AC
  Administered 2024-03-06: 40 meq via ORAL
  Filled 2024-03-06: qty 2

## 2024-03-06 MED ORDER — CYCLOBENZAPRINE HCL 10 MG PO TABS
10.0000 mg | ORAL_TABLET | Freq: Three times a day (TID) | ORAL | Status: DC | PRN
  Administered 2024-03-06 – 2024-03-10 (×7): 10 mg via ORAL
  Filled 2024-03-06 (×7): qty 1

## 2024-03-06 MED ORDER — OXYCODONE HCL 5 MG PO TABS
5.0000 mg | ORAL_TABLET | ORAL | Status: DC | PRN
  Administered 2024-03-06 – 2024-03-10 (×10): 5 mg via ORAL
  Filled 2024-03-06 (×10): qty 1

## 2024-03-06 MED ORDER — POTASSIUM CHLORIDE CRYS ER 20 MEQ PO TBCR
40.0000 meq | EXTENDED_RELEASE_TABLET | Freq: Once | ORAL | Status: AC
  Administered 2024-03-07: 40 meq via ORAL
  Filled 2024-03-06: qty 2

## 2024-03-06 MED ORDER — SPIRONOLACTONE 12.5 MG HALF TABLET
12.5000 mg | ORAL_TABLET | Freq: Every day | ORAL | Status: DC
  Administered 2024-03-06 – 2024-03-07 (×2): 12.5 mg via ORAL
  Filled 2024-03-06 (×2): qty 1

## 2024-03-06 NOTE — Progress Notes (Signed)
 Cardiology Consultation:   Patient ID: Devin [name] MRN: [id number]; DOB: 1961-02-21  Admit date: 03/04/2024 Date of Consult: 03/06/2024  Primary Care Provider: Patient, No Pcp Per Primary Cardiologist: None  Primary Electrophysiologist:  None    Patient Profile:   Devin [name] is a 63 y.o. male with a hx of heart failure who is being seen today for the evaluation of volume overload/CHF at the request of medicine team.  History of Present Illness:   Devin [name] is a 63 y.o. male who HTN, HIV, COPD, meth use presented to the ER with DOE, orthopnea, PND and bilateral lower EXTR swelling x 2 to 3 weeks. He also reported having chest pains for the last 3 to 4 months. No dizziness, syncope.  He was transferred to [facility] for ongoing heart failure management and evaluation by our heart failure team.  On arrival, he was hemodynamically stable and resting in bed.  He has some pulmonary edema and lower extremity edema, along with elevated JVD.  But he appears comfortable at this time.     Past Medical History:  Diagnosis Date   Anxiety    Arthritis    "I'm eat up w/it" (02/20/2017)   Asthma    Chronic back pain    "the whole back" (02/20/2017)   Chronic bronchitis (HCC)    Complication of anesthesia    "felt like I couldn't breath coming out of it"   COPD (chronic obstructive pulmonary disease) (HCC)    Depression    GERD (gastroesophageal reflux disease)    History of hiatal hernia    History of stomach ulcers    "bleeding ones; I was young then"   HIV infection (HCC) dx'd ~ 1999   Hypertension    Pneumonia    "several times" (02/20/2017)   Prolapsed disk 10/28   Pulmonary embolism (HCC) 02/20/2017   Scoliosis 08/24/13     Past Surgical History:  Procedure Laterality Date   BACK SURGERY  2019   KNEE ARTHROSCOPY Right 1980s     Home Medications:  Prior to Admission medications   Medication Sig Start Date End Date Taking? Authorizing Provider  albuterol   (VENTOLIN  HFA) 108 (90 Base) MCG/ACT inhaler Inhale 2 puffs into the lungs every 4 (four) hours as needed for wheezing or shortness of breath. Patient not taking: Reported on 03/04/2024 02/12/24   Sandie Cross, MD  atorvastatin  (LIPITOR) 10 MG tablet Take 1 tablet (10 mg total) by mouth daily. Patient not taking: Reported on 03/04/2024 12/26/23   Sandie Cross, MD  cyclobenzaprine  (FLEXERIL ) 10 MG tablet Take 10 mg by mouth 3 (three) times daily as needed for muscle spasms. Patient not taking: Reported on 03/04/2024    [provider]  diazepam  (VALIUM ) 10 MG tablet Take 10 mg by mouth in the morning and at bedtime. Patient not taking: Reported on 03/04/2024    [provider]  docusate sodium  (COLACE) 100 MG capsule Take 1 capsule (100 mg total) by mouth 2 (two) times daily. Patient not taking: Reported on 06/16/2023 06/03/23   Silas Drivers, MD  DULoxetine  (CYMBALTA ) 60 MG capsule Take 1 capsule (60 mg total) by mouth daily. Patient not taking: Reported on 03/04/2024 12/26/23   Sandie Cross, MD  elvitegravir-cobicistat-emtricitabine -tenofovir  (GENVOYA ) 150-150-200-10 MG TABS tablet Take 1 tablet by mouth daily with breakfast. Patient not taking: Reported on 03/04/2024 12/26/23   Sandie Cross, MD  gabapentin  (NEURONTIN ) 100 MG capsule Take 2 capsules (200 mg total) by mouth  3 (three) times daily. Patient not taking: Reported on 03/04/2024 06/26/23   Juliana Ocean, DO  hydrOXYzine  (ATARAX ) 25 MG tablet Take 1 tablet (25 mg total) by mouth 3 (three) times daily as needed for anxiety. Patient not taking: Reported on 03/04/2024 12/26/23   Sandie Cross, MD  lidocaine  (LIDODERM ) 5 % Place 1 patch onto the skin daily. Remove & Discard patch within 12 hours or as directed by MD Patient not taking: Reported on 03/04/2024 06/26/23   Juliana Ocean, DO  methocarbamol  (ROBAXIN ) 500 MG tablet Take 1 tablet (500 mg total) by mouth 3 (three) times daily. Patient not  taking: Reported on 03/04/2024 06/26/23   Juliana Ocean, DO  OLANZapine  (ZYPREXA ) 10 MG tablet Take 1 tablet (10 mg total) by mouth at bedtime. Patient not taking: Reported on 03/04/2024 12/26/23   Sandie Cross, MD  pantoprazole  (PROTONIX ) 40 MG tablet Take 1 tablet (40 mg total) by mouth daily. Patient not taking: Reported on 03/04/2024 06/26/23   Juliana Ocean, DO  risperiDONE  (RISPERDAL ) 0.5 MG tablet Take 1 tablet (0.5 mg total) by mouth 2 (two) times daily at 8 am and 4 pm. Patient not taking: Reported on 03/04/2024 06/26/23   Juliana Ocean, DO    Inpatient Medications: Scheduled Meds:  apixaban   5 mg Oral BID   Followed by   Cecily Cohen ON 03/11/2024] apixaban   2.5 mg Oral BID   atorvastatin   10 mg Oral Daily   DULoxetine   60 mg Oral Daily   elvitegravir-cobicistat-emtricitabine -tenofovir   1 tablet Oral Q breakfast   gabapentin   200 mg Oral TID   OLANZapine   10 mg Oral QHS   pantoprazole   40 mg Oral Daily   Continuous Infusions:  furosemide  (LASIX ) 200 mg in dextrose 5 % 100 mL (2 mg/mL) infusion 15 mg/hr (03/05/24 1641)   PRN Meds: acetaminophen  **OR** acetaminophen , albuterol , hydrOXYzine , LORazepam , ondansetron  **OR** ondansetron  (ZOFRAN ) IV, oxyCODONE   Allergies:   No Known Allergies  Social History:   Social History   Socioeconomic History   Marital status: Divorced    Spouse name: Not on file   Number of children: Not on file   Years of education: Not on file   Highest education level: Not on file  Occupational History   Not on file  Tobacco Use   Smoking status: Every Day    Current packs/day: 1.00    Average packs/day: 1 pack/day for 48.0 years (48.0 ttl pk-yrs)    Types: Cigarettes   Smokeless tobacco: Never  Vaping Use   Vaping status: Never Used  Substance and Sexual Activity   Alcohol use: Not Currently    Comment: last used about 9 years ago   Drug use: Yes    Types: Marijuana    Comment: denies   Sexual activity: Yes     Partners: Female    Comment: declined condoms  Other Topics Concern   Not on file  Social History Narrative   Not on file   Social Drivers of Health   Financial Resource Strain: Not on file  Food Insecurity: No Food Insecurity (03/04/2024)   Hunger Vital Sign    Worried About Running Out of Food in the Last Year: Never true    Ran Out of Food in the Last Year: Never true  Transportation Needs: No Transportation Needs (03/04/2024)   PRAPARE - Administrator, Civil Service (Medical): No    Lack of Transportation (Non-Medical): No  Physical Activity: Not on file  Stress: Not on file  Social Connections: Unknown (03/04/2024)   Social Connection and Isolation Panel [NHANES]    Frequency of Communication with Friends and Family: More than three times a week    Frequency of Social Gatherings with Friends and Family: More than three times a week    Attends Religious Services: Never    Database administrator or Organizations: No    Attends Banker Meetings: Never    Marital Status: Not on file  Intimate Partner Violence: Not At Risk (03/04/2024)   Humiliation, Afraid, Rape, and Kick questionnaire    Fear of Current or Ex-Partner: No    Emotionally Abused: No    Physically Abused: No    Sexually Abused: No    Family History:    Family History  Problem Relation Age of Onset   Diabetes Father    Stroke Other    Colon cancer Neg Hx      Review of Systems: All other review of systems are negative unless otherwise noted in the HPI as above.   Physical Exam/Data:   Vitals:   03/04/24 2117 03/05/24 1624 03/05/24 2022 03/05/24 2134  BP: 116/76 122/81 (!) 128/93 (!) 110/92  Pulse: (!) 108 (!) 103 (!) 108 (!) 110  Resp: 20 20 16 18   Temp: 97.7 F (36.5 C) 97.6 F (36.4 C) (!) 97.5 F (36.4 C) 98.1 F (36.7 C)  TempSrc: Oral Oral Oral Oral  SpO2: 92% 91% 92% 95%  Weight:    90.6 kg  Height:    6' (1.829 m)    Intake/Output Summary (Last 24 hours) at  03/06/2024 0005 Last data filed at 03/05/2024 2100 Gross per 24 hour  Intake 621.32 ml  Output 1750 ml  Net -1128.68 ml   Filed Weights   03/04/24 1447 03/05/24 2134  Weight: 82.6 kg 90.6 kg   Body mass index is 27.09 kg/m.  General appearance: alert and cooperative Neck: JVD - 7 cm above sternal notch Lungs: Crackles at the bases Heart: regular rate and rhythm, S1, S2 normal, no murmur, click, rub or gallop Extremities: edema 2+ Neurologic: Grossly normal   Relevant CV Studies:  1. No LV thrombus by Definity. Left ventricular ejection fraction, by  estimation, is <20%. The left ventricle has severely decreased function.  The left ventricle has no regional wall motion abnormalities. The left  ventricular internal cavity size was  severely dilated. Indeterminate diastolic filling due to E-A fusion.  Elevated left atrial pressure.   2. Right ventricular systolic function is severely reduced. The right  ventricular size is mildly enlarged. There is mildly elevated pulmonary  artery systolic pressure. The estimated right ventricular systolic  pressure is 37.1 mmHg.   3. Right atrial size was severely dilated.   4. The mitral valve is abnormal. No evidence of mitral valve  regurgitation. No evidence of mitral stenosis. Moderate mitral annular  calcification.   5. The aortic valve was not well visualized. Aortic valve regurgitation  is not visualized. No aortic stenosis is present.   6. The inferior vena cava is dilated in size with <50% respiratory  variability, suggesting right atrial pressure of 15 mmHg.   Laboratory Data:  Chemistry Recent Labs  Lab 03/04/24 1519 03/05/24 0415  NA 136 138  K 4.9 4.0  CL 101 100  CO2 26 31  GLUCOSE 97 110*  BUN 13 15  CREATININE 1.26* 1.43*  CALCIUM  9.6 8.9  GFRNONAA >60 56*  ANIONGAP 9 7  Recent Labs  Lab 03/05/24 1029  PROT 7.2  ALBUMIN 3.7  AST 30  ALT 22  ALKPHOS 84  BILITOT 0.7   Hematology Recent Labs  Lab  03/04/24 1519 03/05/24 0415  WBC 4.4 4.3  RBC 4.80 4.46  HGB 14.7 12.9*  HCT 45.3 43.1  MCV 94.4 96.6  MCH 30.6 28.9  MCHC 32.5 29.9*  RDW 12.7 12.5  PLT 185 153   Cardiac EnzymesNo results for input(s): "TROPONINI" in the last 168 hours. No results for input(s): "TROPIPOC" in the last 168 hours.  BNP Recent Labs  Lab 03/05/24 0415  BNP 1,823.0*    DDimer No results for input(s): "DDIMER" in the last 168 hours.  Radiology/Studies:  ECHOCARDIOGRAM COMPLETE Result Date: 03/05/2024    ECHOCARDIOGRAM REPORT   Patient Name:   Bookert O Maulding Date of Exam: 03/05/2024 Medical Rec #:  [id number]    Height:       72.0 in Accession #:    [id number]   Weight:       182.0 lb Date of Birth:  Dec 03, 1960    BSA:          2.047 m Patient Age:    62 years     BP:           116/76 mmHg Patient Gender: M            HR:           110 bpm. Exam Location:  [facility] Procedure: 2D Echo, Cardiac Doppler, Color Doppler and Intracardiac            Opacification Agent (Both Spectral and Color Flow Doppler were            utilized during procedure). REPORT CONTAINS CRITICAL RESULT Indications:    R06.02 SOB  History:        Patient has no prior history of Echocardiogram examinations.                 CHF, Signs/Symptoms:Chest Pain and Altered Mental Status; Risk                 Factors:Hypertension and Current Smoker. Substance abuse.                 Pulmonary embolus. ETOH. HIV.  Sonographer:    Raynelle Callow RDCS Referring Phys: [id number] [facility] ARRIEN  Sonographer Comments: Technically difficult study due to poor echo windows. Image acquisition challenging due to uncooperative patient. IMPRESSIONS  1. No LV thrombus by Definity. Left ventricular ejection fraction, by estimation, is <20%. The left ventricle has severely decreased function. The left ventricle has no regional wall motion abnormalities. The left ventricular internal cavity size was severely dilated. Indeterminate diastolic filling due to E-A fusion. Elevated  left atrial pressure.  2. Right ventricular systolic function is severely reduced. The right ventricular size is mildly enlarged. There is mildly elevated pulmonary artery systolic pressure. The estimated right ventricular systolic pressure is 37.1 mmHg.  3. Right atrial size was severely dilated.  4. The mitral valve is abnormal. No evidence of mitral valve regurgitation. No evidence of mitral stenosis. Moderate mitral annular calcification.  5. The aortic valve was not well visualized. Aortic valve regurgitation is not visualized. No aortic stenosis is present.  6. The inferior vena cava is dilated in size with <50% respiratory variability, suggesting right atrial pressure of 15 mmHg. Comparison(s): No prior Echocardiogram. FINDINGS  Left Ventricle: No LV thrombus by Definity. Left ventricular ejection fraction, by estimation,  is <20%. The left ventricle has severely decreased function. The left ventricle has no regional wall motion abnormalities. Definity contrast agent was given IV to delineate the left ventricular endocardial borders. Strain was performed and the global longitudinal strain is indeterminate. The left ventricular internal cavity size was severely dilated. There is no left ventricular hypertrophy. Indeterminate diastolic filling due to E-A fusion. Elevated left atrial pressure. Right Ventricle: The right ventricular size is mildly enlarged. No increase in right ventricular wall thickness. Right ventricular systolic function is severely reduced. There is mildly elevated pulmonary artery systolic pressure. The tricuspid regurgitant velocity is 2.35 m/s, and with an assumed right atrial pressure of 15 mmHg, the estimated right ventricular systolic pressure is 37.1 mmHg. Left Atrium: Left atrial size was normal in size. Right Atrium: Right atrial size was severely dilated. Pericardium: There is no evidence of pericardial effusion. Mitral Valve: The mitral valve is abnormal. Moderate mitral annular  calcification. No evidence of mitral valve regurgitation. No evidence of mitral valve stenosis. Tricuspid Valve: The tricuspid valve is normal in structure. Tricuspid valve regurgitation is mild . No evidence of tricuspid stenosis. Aortic Valve: The aortic valve was not well visualized. Aortic valve regurgitation is not visualized. No aortic stenosis is present. Pulmonic Valve: The pulmonic valve was not well visualized. Pulmonic valve regurgitation is not visualized. No evidence of pulmonic stenosis. Aorta: The aortic root is normal in size and structure. Venous: The inferior vena cava is dilated in size with less than 50% respiratory variability, suggesting right atrial pressure of 15 mmHg. IAS/Shunts: No atrial level shunt detected by color flow Doppler. Additional Comments: 3D was performed not requiring image post processing on an independent workstation and was indeterminate.  LEFT VENTRICLE PLAX 2D LVIDd:         6.90 cm      Diastology LVIDs:         6.10 cm      LV e' medial:    6.96 cm/s LV PW:         1.00 cm      LV E/e' medial:  9.7 LV IVS:        1.20 cm      LV e' lateral:   10.10 cm/s LVOT diam:     2.30 cm      LV E/e' lateral: 6.7 LV SV:         30 LV SV Index:   15 LVOT Area:     4.15 cm  LV Volumes (MOD) LV vol d, MOD A2C: 165.0 ml LV vol d, MOD A4C: 156.5 ml LV vol s, MOD A2C: 137.0 ml LV vol s, MOD A4C: 144.5 ml LV SV MOD A2C:     28.0 ml LV SV MOD A4C:     156.5 ml LV SV MOD BP:      21.5 ml RIGHT VENTRICLE            IVC RV S prime:     5.44 cm/s  IVC diam: 3.00 cm TAPSE (M-mode): 1.5 cm LEFT ATRIUM             Index        RIGHT ATRIUM           Index LA diam:        4.20 cm 2.05 cm/m   RA Area:     29.80 cm LA Vol (A2C):   61.4 ml 30.00 ml/m  RA Volume:   112.00 ml 54.72 ml/m LA Vol (A4C):  58.2 ml 28.43 ml/m LA Biplane Vol: 60.1 ml 29.36 ml/m  AORTIC VALVE LVOT Vmax:   60.60 cm/s LVOT Vmean:  37.700 cm/s LVOT VTI:    0.073 m  AORTA Ao Root diam: 3.60 cm MITRAL VALVE                TRICUSPID VALVE MV Area (PHT): 5.42 cm    TR Peak grad:   22.1 mmHg MV Decel Time: 140 msec    TR Vmax:        235.00 cm/s MV E velocity: 67.70 cm/s MV A velocity: 30.14 cm/s  SHUNTS MV E/A ratio:  2.25        Systemic VTI:  0.07 m                            Systemic Diam: 2.30 cm Vishnu Priya Mallipeddi Electronically signed by Lucetta Russel Mallipeddi Signature Date/Time: 03/05/2024/4:13:22 PM    Final    CT Angio Chest Pulmonary Embolism (PE) W or WO Contrast Result Date: 03/04/2024 CLINICAL DATA:  Acute shortness of breath and right shoulder pain. EXAM: CT ANGIOGRAPHY CHEST WITH CONTRAST TECHNIQUE: Multidetector CT imaging of the chest was performed using the standard protocol during bolus administration of intravenous contrast. Multiplanar CT image reconstructions and MIPs were obtained to evaluate the vascular anatomy. RADIATION DOSE REDUCTION: This exam was performed according to the departmental dose-optimization program which includes automated exposure control, adjustment of the mA and/or kV according to patient size and/or use of iterative reconstruction technique. CONTRAST:  75mL OMNIPAQUE  IOHEXOL  350 MG/ML SOLN COMPARISON:  April 24, 2022. FINDINGS: Cardiovascular: Satisfactory opacification of the pulmonary arteries to the segmental level. No evidence of pulmonary embolism. Mild cardiomegaly. Minimal pericardial effusion. Mediastinum/Nodes: No enlarged mediastinal, hilar, or axillary lymph nodes. Thyroid gland, trachea, and esophagus demonstrate no significant findings. Lungs/Pleura: No pneumothorax is noted. Mild to moderate bilateral pleural effusions are noted with minimal adjacent subsegmental atelectasis. Upper Abdomen: Mild ascites is noted around the liver and spleen. Musculoskeletal: Old bilateral rib fractures are noted. Review of the MIP images confirms the above findings. IMPRESSION: No definite evidence of pulmonary embolus. Mild to moderate bilateral pleural effusions are noted with  adjacent minimal subsegmental atelectasis. Mild ascites is noted in visualized portion of upper abdomen. Minimal pericardial effusion. Electronically Signed   By: Rosalene Colon M.D.   On: 03/04/2024 17:40   US  Venous Img Lower Bilateral (DVT) Result Date: 03/04/2024 CLINICAL DATA:  Bilateral lower extremity edema.  History of DVTs. EXAM: BILATERAL LOWER EXTREMITY VENOUS DOPPLER ULTRASOUND TECHNIQUE: Gray-scale sonography with graded compression, as well as color Doppler and duplex ultrasound were performed to evaluate the lower extremity deep venous systems from the level of the common femoral vein and including the common femoral, femoral, profunda femoral, popliteal and calf veins including the posterior tibial, peroneal and gastrocnemius veins when visible. The superficial great saphenous vein was also interrogated. Spectral Doppler was utilized to evaluate flow at rest and with distal augmentation maneuvers in the common femoral, femoral and popliteal veins. COMPARISON:  04/07/2019 FINDINGS: RIGHT LOWER EXTREMITY Common Femoral Vein: No evidence of thrombus. Normal compressibility, respiratory phasicity and response to augmentation. Saphenofemoral Junction: No evidence of thrombus. Normal compressibility and flow on color Doppler imaging. Profunda Femoral Vein: No evidence of thrombus. Normal compressibility and flow on color Doppler imaging. Femoral Vein: No evidence of thrombus. Normal compressibility, respiratory phasicity and response to augmentation. Popliteal Vein: No evidence of thrombus. Normal  compressibility, respiratory phasicity and response to augmentation. Calf Veins: No evidence of thrombus. Normal compressibility and flow on color Doppler imaging. Superficial Great Saphenous Vein: No evidence of thrombus. Normal compressibility. Venous Reflux:  None. Other Findings:  None. LEFT LOWER EXTREMITY Common Femoral Vein: No evidence of thrombus. Normal compressibility, respiratory phasicity and  response to augmentation. Saphenofemoral Junction: No evidence of thrombus. Normal compressibility and flow on color Doppler imaging. Profunda Femoral Vein: No evidence of thrombus. Normal compressibility and flow on color Doppler imaging. Femoral Vein: Peripheral nonocclusive thrombus noted in the distal left femoral vein. Popliteal Vein: Peripheral nonocclusive thrombus within the left popliteal vein. Calf Veins: No evidence of thrombus. Normal compressibility and flow on color Doppler imaging. Superficial Great Saphenous Vein: No evidence of thrombus. Normal compressibility. Venous Reflux:  None. Other Findings:  None. IMPRESSION: Partially occlusive peripheral thrombus in the distal left femoral vein and popliteal vein. This could be partially occlusive acute thrombus or recanalized chronic thrombus. No evidence of right lower extremity DVT. Electronically Signed   By: Janeece Mechanic M.D.   On: 03/04/2024 17:27   DG Chest Port 1 View Result Date: 03/04/2024 CLINICAL DATA:  Shortness of breath, right shoulder pain and swelling in both legs. Patient fell at home 2-3 days prior. EXAM: PORTABLE CHEST 1 VIEW COMPARISON:  Radiographs 06/22/2023 and 10/17/2022.  CT 04/24/2022. FINDINGS: 1527 hours. Lower lung volumes with mild patient rotation. The heart size and mediastinal contours are stable. There is increased vascular congestion with possible mild edema, a small left pleural effusion and probable mild bibasilar atelectasis. No confluent airspace disease or pneumothorax. No acute osseous findings are evident. Telemetry leads overlie the chest. IMPRESSION: Lower lung volumes with increased vascular congestion and possible mild edema. Small left pleural effusion and probable mild bibasilar atelectasis. Electronically Signed   By: Elmon Hagedorn M.D.   On: 03/04/2024 15:38    Assessment and Plan:   Decompensated heart failure Presented with dyspnea on exertion, orthopnea, BNP elevated.  Currently on a Lasix   infusion at 15 mg /h.  Recommendations: - Continue Lasix  infusion - Monitor electrolytes and keep K>4, Mg>2 -goal net negative urine output 2L        For questions or updates, please contact [facility] Please consult [email or web address] for contact info under     Signed, Bert Britain, MD  03/06/2024 12:05 AM

## 2024-03-06 NOTE — Progress Notes (Signed)
 Progress Note    Devin [name]   ZOX:[id number]  DOB: 1961-04-09  DOA: 03/04/2024     1 PCP: Patient, No Pcp Per  Initial CC: SOB  Hospital Course: Devin [name] is a 63 y.o. male with medical history significant of COPD, hypertension, history of pulmonary embolism, HIV infection and scoliosis who presented with lower extremity edema and dyspnea.  Patient reported 4 weeks of worsening lower extremity edema, but more severe over the last 2 days. It has been associated with exertional dyspnea but not frank angina symptoms.  Devin [name] had an episode of severe episode of paroxysmal nocturnal dyspnea.  Devin [name] [place] [employer].  Devin [name] was then transported to the ER via EMS.   Devin [name] sustained a fall also 2 days prior, hitting his right shoulder, since then, his pain has been constant.  Apparently Devin [name] is not taking any medications and has not seen his primary care provider in a long time.    Reports using methamphetamines (crystal meth) for pain over last several weeks.   Assessment and Plan:  Acute heart failure (HCC) Patient with signs of volume overload.  - Requiring 2.5 L of oxygen, satting 92%, -BNP 1823, creatinine 1.43 - CXR noted with pleural effusions -Cardiology following, appreciate assistance -Echo obtained showing EF less than 20%, no RWMA, indeterminate diastolic filling  Troponin elevation  - Denies of any chest pain, no EKG changes -Troponin remained flat 64, 66 - suspected demand insetting of severely reduced EF and CHF exac   AKI  - normal baseline  - monitor while on diuresis  History of pulmonary embolism LLE DVT - Partially occlusive peripheral thrombus in the distal left femoral vein and popliteal vein. This could be partially occlusive acute thrombus or recanalized chronic thrombus. - CTA chest negative for PE - continue on Eliquis  for now  Essential hypertension, benign -Monitoring BP -Holding ARB's  Human immunodeficiency virus (HIV)  disease (HCC) - continue Genvoya     GERD  - Continue pantoprazole     Substance abuse (HCC)-with hallucination and paranoia Patient has been using methamphetamines - PRN ativan     History of alcohol abuse, with no signs of acute withdrawal.  -Consulted psych for evaluation and recommendations  Right shoulder pain, continue oral analgesics     Interval History:  Biggest complaint this morning is cramping in his legs.  Breathing seems to be improved.  Remains a little agitated but easily redirectable.    Old records reviewed in assessment of this patient  Antimicrobials:   DVT prophylaxis:  apixaban  (ELIQUIS ) tablet 2.5 mg Start: 03/11/24 2200 SCDs Start: 03/04/24 1935 apixaban  (ELIQUIS ) tablet 5 mg  apixaban  (ELIQUIS ) tablet 2.5 mg   Code Status:   Code Status: Full Code  Mobility Assessment (Last 72 Hours)     Mobility Assessment     Row Name 03/06/24 0731 03/05/24 2100 03/05/24 1100 03/04/24 2130     Does patient have an order for bedrest or is patient medically unstable No - Continue assessment No - Continue assessment No - Continue assessment No - Continue assessment    What is the highest level of mobility based on the progressive mobility assessment? Level 6 (Walks independently in room and hall) - Balance while walking in room without assist - Complete Level 6 (Walks independently in room and hall) - Balance while walking in room without assist - Complete Level 6 (Walks independently in room and hall) - Balance while walking in room without assist - Complete Level  6 (Walks independently in room and hall) - Balance while walking in room without assist - Complete             Barriers to discharge: none Disposition Plan:  Home HH orders placed: n/a Status is: Inpt  Objective: Blood pressure (!) 132/90, pulse (!) 102, temperature 97.6 F (36.4 C), temperature source Oral, resp. rate 20, height 6' (1.829 m), weight 90.6 kg, SpO2 91%.  Examination:  Physical  Exam Constitutional:      General: Devin [name] is not in acute distress.    Appearance: Normal appearance.  HENT:     Head: Normocephalic and atraumatic.     Mouth/Throat:     Mouth: Mucous membranes are moist.  Eyes:     Extraocular Movements: Extraocular movements intact.  Cardiovascular:     Rate and Rhythm: Normal rate and regular rhythm.  Pulmonary:     Effort: Pulmonary effort is normal. No respiratory distress.     Breath sounds: Normal breath sounds. No wheezing.  Abdominal:     General: Bowel sounds are normal. There is no distension.     Palpations: Abdomen is soft.     Tenderness: There is no abdominal tenderness.  Musculoskeletal:        General: Normal range of motion.     Cervical back: Normal range of motion and neck supple.     Right lower leg: Edema present.     Left lower leg: Edema present.  Skin:    General: Skin is warm and dry.  Neurological:     General: No focal deficit present.     Mental Status: Devin [name] is alert.  Psychiatric:        Mood and Affect: Mood is anxious.      Consultants:  Cardiology  Procedures:    Data Reviewed: Results for orders placed or performed during the hospital encounter of 03/04/24 (from the past 24 hours)  CBC with Differential/Platelet     Status: None   Collection Time: 03/06/24 10:18 AM  Result Value Ref Range   WBC 6.0 4.0 - 10.5 K/uL   RBC 5.35 4.22 - 5.81 MIL/uL   Hemoglobin 15.5 13.0 - 17.0 g/dL   HCT 40.9 81.1 - 91.4 %   MCV 89.9 80.0 - 100.0 fL   MCH 29.0 26.0 - 34.0 pg   MCHC 32.2 30.0 - 36.0 g/dL   RDW 78.2 95.6 - 21.3 %   Platelets 191 150 - 400 K/uL   nRBC 0.0 0.0 - 0.2 %   Neutrophils Relative % 62 %   Neutro Abs 3.7 1.7 - 7.7 K/uL   Lymphocytes Relative 26 %   Lymphs Abs 1.6 0.7 - 4.0 K/uL   Monocytes Relative 9 %   Monocytes Absolute 0.6 0.1 - 1.0 K/uL   Eosinophils Relative 2 %   Eosinophils Absolute 0.1 0.0 - 0.5 K/uL   Basophils Relative 1 %   Basophils Absolute 0.1 0.0 - 0.1 K/uL   Immature  Granulocytes 0 %   Abs Immature Granulocytes 0.02 0.00 - 0.07 K/uL  T4, free     Status: None   Collection Time: 03/06/24 10:18 AM  Result Value Ref Range   Free T4 0.80 0.61 - 1.12 ng/dL  Magnesium      Status: None   Collection Time: 03/06/24 10:18 AM  Result Value Ref Range   Magnesium  1.7 1.7 - 2.4 mg/dL    I have reviewed pertinent nursing notes, vitals, labs, and images as necessary. I have ordered labwork  to follow up on as indicated.  I have reviewed the last notes from staff over past 24 hours. I have discussed patient's care plan and test results with nursing staff, CM/SW, and other staff as appropriate.  Time spent: Greater than 50% of the 55 minute visit was spent in counseling/coordination of care for the patient as laid out in the A&P.   LOS: 1 day   Faith Homes, MD [facility] 03/06/2024, 2:05 PM

## 2024-03-06 NOTE — Progress Notes (Signed)
 The patient is very upset stating that the RN on day shift violated his HIPAA regulation by telling his son information he doesn't need to here. He was projecting his frustrations on this RN. This RN apologized for what ever happened on day shift. The patient initially refused his night time medications  because of that. Charge RN was made aware and he talked to the patient, and he finally took the meds.

## 2024-03-06 NOTE — Consult Note (Addendum)
 [facility] Psychiatric Consult Initial  Patient Name: .Devin [name]  MRN: [id number]  DOB: 11/11/1960  Consult Order details:  Orders (From admission, onward)     Start     Ordered   03/05/24 0853  CONSULT TO CALL ACT TEAM       Ordering Provider: Bobbetta Burnet, MD  Provider:  (Not yet assigned)  Question:  Reason for Consult?  Answer:  Hallucination, paranoid, abuses methadone, cocaine fentanyl   03/05/24 0852   03/05/24 0852  IP CONSULT TO PSYCHIATRY       Ordering Provider: Bobbetta Burnet, MD  Provider:  (Not yet assigned)  Question Answer Comment  Location [facility]   Reason for Consult? Hallucination, paranoid, abuses methadone, cocaine fentanyl      03/05/24 0852             Mode of Visit: In person    Psychiatry Consult Evaluation  Service Date: Mar 06, 2024 LOS:  LOS: 1 day  Chief Complaint "A lady hacked into my youtube account and I've called the FBI multiple times with no success. This has been going on for the past 16 years and I don't know what to do about it."   Primary Psychiatric Diagnoses  Delusional disorder 2.  Michel depressive disorder-per history 3.  Cocaine abuse in remission   Assessment  Devin [name] is a 63 y.o. male admitted: Medicallyfor 03/04/2024  2:32 PM for shortness of breath. He carries the psychiatric diagnoses of Trennon depressive disorder and cocaine abuse in remission and has a past medical history of  HTN, HIV, COPD, who presented to the ER with DOE, orthopnea, PND and bilateral lower EXTR swelling x 2 to 3 weeks. Psychiatric consulted for evaluation of "Hallucination, paranoid, abuses methadone, cocaine fentanyl"   His current presentation of chronic, ongoing fixed delusional belief of being hacked which has been going on for 16 years without any other psychotic symptoms is most consistent with Delusional disorder. He does not meet criteria for inpatient admission based on absent suicidal or homicidal ideation, and  does not appears to be at risk of harm to self or others. Current outpatient psychotropic medications include Duloxetine  60 mg daily, Olanzapine  10 mg at bedtime, and hydroxyzine  25 mg three times daily PRN for anxiety prescribes by his infectious disease doctor. Historically, patient appears to have been stabilized on these medications. Unsure if patient was compliant on these medications prior to admission. On initial examination, patient alert, awake and oriented to time, place and person. His mood is "stable" with congruent affect. Please see plan below for detailed recommendations.   Diagnoses:  Active Hospital problems: Principal Problem:   Acute heart failure (HCC) Active Problems:   Human immunodeficiency virus (HIV) disease (HCC)   Substance abuse (HCC)   GERD   Essential hypertension, benign   History of pulmonary embolism   Acute exacerbation of CHF (congestive heart failure) (HCC)    Plan   ## Psychiatric Medication Recommendations:  Continue home medications: -Olanzapine  10 mg at bedtime for psychosis/sleep -Duloxetine  60 mg daily for mood -Hydroxyzine  25 mg TID PRN for anxiety -Continue medical treatment as the primary team.  -Patient may benefit from psychotherapy referral upon discharge.   ## Medical Decision Making Capacity: Not specifically addressed in this encounter  ## Further Work-up:  -- None  -- most recent EKG on 03/04/2024 had QtC of 477 -- Pertinent labwork reviewed earlier this admission includes: Urine toxicology is negative on this admission.    ##  Disposition:-- There are no psychiatric contraindications to discharge at this time  ## Behavioral / Environmental: - No specific recommendations at this time.  Can follow up with his infectious disease doctor for his psychotropic medications.    ## Safety and Observation Level:  - Based on my clinical evaluation, I estimate the patient to be at low risk of self harm in the current setting. - At this  time, we recommend  routine. This decision is based on my review of the chart including patient's history and current presentation, interview of the patient, mental status examination, and consideration of suicide risk including evaluating suicidal ideation, plan, intent, suicidal or self-harm behaviors, risk factors, and protective factors. This judgment is based on our ability to directly address suicide risk, implement suicide prevention strategies, and develop a safety plan while the patient is in the clinical setting. Please contact our team if there is a concern that risk level has changed.  CSSR Risk Category:C-SSRS RISK CATEGORY: No Risk  Suicide Risk Assessment: Patient has following modifiable risk factors for suicide: lack of access to outpatient mental health resources, which we are addressing by recommending referral for psychotherapy upon discharge.  Patient has following non-modifiable or demographic risk factors for suicide: male gender Patient has the following protective factors against suicide: no history of suicide attempts  Thank you for this consult request. Recommendations have been communicated to the primary team.  We will sign off at this time.   [name], MD       History of Present Illness  Relevant Aspects of [facility]   Patient Report:  Patient seen face to face in his hospital room. He is awake, alert and oriented to time, place and person. Patient reports chronic ongoing fixed delusional belief that a lady broke into his youtube account and has been monitoring his activities for the last 16 years. He has called the FBI and law enforcement agents multiple times with no success. Additionally, patient shows this Clinical research associate a picture on his Ipad which he took in his living room which appears like a shadow of a black object, however, patient believes the object in the picture is a human being. However, patient denies when asked if believes someone has broken  into his house. Patient states he lives alone and feels safe at home.   Patient denies auditory hallucinations, anxiety, depressed mood, lack of motivation, sleep issues, appetite problem, suicidal or homicidal ideation or intent. Patient also denies ongoing anxiety, nervousness, racing thoughts, or being apprehensive. Patient denies any recent substance abuse, but states that he last used drugs about a year ago. Review of EMR shows that patient was seeing a counselor in 2022 for Ascher depressive disorder but stopped after a couple of sessions. He was also seen in the medical ER in July 2024 for auditory hallucination after using cocaine and alcohol.    Collateral information:  Patient did not give permission to contact any of his relatives.   Review of Systems  Psychiatric/Behavioral:  Positive for substance abuse. Negative for depression, hallucinations and suicidal ideas. The patient is not nervous/anxious and does not have insomnia.      Psychiatric and Social History  Psychiatric History:  Information collected from the patient   Prev Dx/Sx: Juddson depressive disorder  Current Psych Provider: None  Home Meds (current): Olanzapine  10 mg at bedtime, Duloxetine  60 mg daily, and Hydroxyzine  25 mg TID PRN for anxiety Previous Med Trials: unknown  Therapy: used to be in therapy but  stopped in 2022  Prior Psych Hospitalization: denies   Prior Self Harm: denies  Prior Violence: denies   Family Psych History: denies  Family Hx suicide: denies   Social History:  Educational Hx:  Occupational Hx: retired Armed forces operational officer Hx: denies  Living Situation: lives alone  Spiritual Hx: unsure  Access to weapons/lethal means: patient states he has a gun at home but denies any active suicidal or homicidal ideation or intent.    Substance History Alcohol: socially  Type of alcohol beer Last Drink long time ago Number of drinks per day socially History of alcohol withdrawal seizures denies  History of  DT's denies  Tobacco: denies  Illicit drugs: Patient reports prior use of cocaine and cannabis, but stopped about a year ago.  Prescription drug abuse: denies  Rehab hx: denies   Exam Findings  Physical Exam:  Vital Signs:  Temp:  [97.5 F (36.4 C)-98.1 F (36.7 C)] 97.6 F (36.4 C) (05/10 0731) Pulse Rate:  [102-110] 102 (05/10 0731) Resp:  [16-20] 20 (05/10 0731) BP: (110-132)/(69-93) 132/90 (05/10 0731) SpO2:  [90 %-95 %] 91 % (05/10 0731) Weight:  [90.6 kg] 90.6 kg (05/10 0554) Blood pressure (!) 132/90, pulse (!) 102, temperature 97.6 F (36.4 C), temperature source Oral, resp. rate 20, height 6' (1.829 m), weight 90.6 kg, SpO2 91%. Body mass index is 27.09 kg/m.  Physical Exam  Mental Status Exam: General Appearance: Casual  Orientation:  Full (Time, Place, and Person)  Memory:  Immediate;   Good  Concentration:  Concentration: Good  Recall:  Fair  Attention  Good  Eye Contact:  Fair  Speech:  Clear and Coherent  Language:  Good  Volume:  Normal  Mood: "worried"  Affect:  Appropriate  Thought Process:  Goal Directed  Thought Content:  Delusions  Suicidal Thoughts:  No  Homicidal Thoughts:  No  Judgement:  Fair  Insight:  Lacking  Psychomotor Activity:  Normal  Akathisia:  No  Fund of Knowledge:  Fair      Assets:  Communication Skills  Cognition:  WNL  ADL's:  Intact  AIMS (if indicated):        Other History   These have been pulled in through the EMR, reviewed, and updated if appropriate.  Family History:  The patient's family history includes Diabetes in his father; Stroke in an other family member.  Medical History: Past Medical History:  Diagnosis Date   Anxiety    Arthritis    "I'm eat up w/it" (02/20/2017)   Asthma    Chronic back pain    "the whole back" (02/20/2017)   Chronic bronchitis (HCC)    Complication of anesthesia    "felt like I couldn't breath coming out of it"   COPD (chronic obstructive pulmonary disease) (HCC)     Depression    GERD (gastroesophageal reflux disease)    History of hiatal hernia    History of stomach ulcers    "bleeding ones; I was young then"   HIV infection (HCC) dx'd ~ 1999   Hypertension    Pneumonia    "several times" (02/20/2017)   Prolapsed disk 10/28   Pulmonary embolism (HCC) 02/20/2017   Scoliosis 08/24/13    Surgical History: Past Surgical History:  Procedure Laterality Date   BACK SURGERY  2019   KNEE ARTHROSCOPY Right 1980s     Medications:   Current Facility-Administered Medications:    acetaminophen  (TYLENOL ) tablet 650 mg, 650 mg, Oral, Q6H PRN **OR** acetaminophen  (TYLENOL ) suppository 650 mg,  650 mg, Rectal, Q6H PRN, Shahmehdi, Seyed A, MD   albuterol  (PROVENTIL ) (2.5 MG/3ML) 0.083% nebulizer solution 3 mL, 3 mL, Nebulization, Q4H PRN, Shahmehdi, Seyed A, MD   apixaban  (ELIQUIS ) tablet 5 mg, 5 mg, Oral, BID, 5 mg at 03/06/24 0920 **FOLLOWED BY** [START ON 03/11/2024] apixaban  (ELIQUIS ) tablet 2.5 mg, 2.5 mg, Oral, BID, Shahmehdi, Seyed A, MD   atorvastatin  (LIPITOR) tablet 10 mg, 10 mg, Oral, Daily, Shahmehdi, Seyed A, MD, 10 mg at 03/06/24 0920   cyclobenzaprine  (FLEXERIL ) tablet 10 mg, 10 mg, Oral, TID PRN, Girguis, David, MD, 10 mg at 03/06/24 1007   DULoxetine  (CYMBALTA ) DR capsule 60 mg, 60 mg, Oral, Daily, Shahmehdi, Seyed A, MD, 60 mg at 03/06/24 0920   elvitegravir-cobicistat-emtricitabine -tenofovir  (GENVOYA ) 150-150-200-10 MG tablet 1 tablet, 1 tablet, Oral, Q breakfast, Shahmehdi, Seyed A, MD, 1 tablet at 03/06/24 0920   furosemide  (LASIX ) 200 mg in dextrose 5 % 100 mL (2 mg/mL) infusion, 15 mg/hr, Intravenous, Continuous, Shahmehdi, Seyed A, MD, Last Rate: 7.5 mL/hr at 03/06/24 0357, 15 mg/hr at 03/06/24 0357   gabapentin  (NEURONTIN ) capsule 200 mg, 200 mg, Oral, TID, Shahmehdi, Seyed A, MD, 200 mg at 03/06/24 0920   hydrOXYzine  (ATARAX ) tablet 25 mg, 25 mg, Oral, TID PRN, Shahmehdi, Seyed A, MD   LORazepam  (ATIVAN ) tablet 1 mg, 1 mg, Oral, Q4H PRN,  Shahmehdi, Seyed A, MD, 1 mg at 03/05/24 1018   magnesium  sulfate IVPB 2 g 50 mL, 2 g, Intravenous, Once, Faith Homes, MD   OLANZapine  (ZYPREXA ) tablet 10 mg, 10 mg, Oral, QHS, Shahmehdi, Seyed A, MD, 10 mg at 03/05/24 2334   ondansetron  (ZOFRAN ) tablet 4 mg, 4 mg, Oral, Q6H PRN **OR** ondansetron  (ZOFRAN ) injection 4 mg, 4 mg, Intravenous, Q6H PRN, Shahmehdi, Seyed A, MD   oxyCODONE  (Oxy IR/ROXICODONE ) immediate release tablet 5 mg, 5 mg, Oral, Q4H PRN, Girguis, David, MD, 5 mg at 03/06/24 1101   pantoprazole  (PROTONIX ) EC tablet 40 mg, 40 mg, Oral, Daily, Shahmehdi, Seyed A, MD, 40 mg at 03/06/24 0920  Allergies: No Known Allergies  Gamaliel Charney A Charmon Thorson, MD

## 2024-03-06 NOTE — Progress Notes (Signed)
 Peripherally Inserted Central Catheter Placement  The IV Nurse has discussed with the patient and/or persons authorized to consent for the patient, the purpose of this procedure and the potential benefits and risks involved with this procedure.  The benefits include less needle sticks, lab draws from the catheter, and the patient may be discharged home with the catheter. Risks include, but not limited to, infection, bleeding, blood clot (thrombus formation), and puncture of an artery; nerve damage and irregular heartbeat and possibility to perform a PICC exchange if needed/ordered by physician.  Alternatives to this procedure were also discussed.  Bard Power PICC patient education guide, fact sheet on infection prevention and patient information card has been provided to patient /or left at bedside.    PICC Placement Documentation  PICC Double Lumen 03/06/24 Right Brachial 40 cm 2 cm (Active)  Indication for Insertion or Continuance of Line Poor Vasculature-patient has had multiple peripheral attempts or PIVs lasting less than 24 hours;Vasoactive infusions 03/06/24 1638  Site Assessment Clean, Dry, Intact 03/06/24 1638  Lumen #1 Status Flushed;Saline locked;Blood return noted 03/06/24 1638  Lumen #2 Status Flushed;Saline locked;Blood return noted 03/06/24 1638  Dressing Type Transparent;Securing device 03/06/24 1638  Dressing Status Antimicrobial disc/dressing in place;Clean, Dry, Intact 03/06/24 1638  Line Care Connections checked and tightened 03/06/24 1638  Line Adjustment (NICU/IV Team Only) No 03/06/24 1638  Dressing Intervention New dressing;Adhesive placed at insertion site (IV team only) 03/06/24 1638  Dressing Change Due 03/13/24 03/06/24 1638       Saida Lonon Haywood Lisle 03/06/2024, 4:42 PM

## 2024-03-06 NOTE — Plan of Care (Signed)

## 2024-03-06 NOTE — Progress Notes (Signed)
 PHARMACY - ANTICOAGULATION CONSULT NOTE  Pharmacy Consult for Eliquis > heparin   Indication: DVT  No Known Allergies  Patient Measurements: Height: 6' (182.9 cm) Weight: 90.6 kg (199 lb 11.8 oz) IBW/kg (Calculated) : 77.6 HEPARIN  DW (KG): 90.6  Vital Signs: Temp: 97.8 F (36.6 C) (05/10 1300) Temp Source: Oral (05/10 1300) BP: 109/72 (05/10 1300) Pulse Rate: 83 (05/10 1300)  Labs: Recent Labs    03/04/24 1519 03/04/24 1815 03/05/24 0415 03/06/24 1018  HGB 14.7  --  12.9* 15.5  HCT 45.3  --  43.1 48.1  PLT 185  --  153 191  CREATININE 1.26*  --  1.43*  --   TROPONINIHS 64* 66*  --   --     Estimated Creatinine Clearance: 60.3 mL/min (A) (by C-G formula based on SCr of 1.43 mg/dL (H)).   Medical History: Past Medical History:  Diagnosis Date   Anxiety    Arthritis    "I'm eat up w/it" (02/20/2017)   Asthma    Chronic back pain    "the whole back" (02/20/2017)   Chronic bronchitis (HCC)    Complication of anesthesia    "felt like I couldn't breath coming out of it"   COPD (chronic obstructive pulmonary disease) (HCC)    Depression    GERD (gastroesophageal reflux disease)    History of hiatal hernia    History of stomach ulcers    "bleeding ones; I was young then"   HIV infection (HCC) dx'd ~ 1999   Hypertension    Pneumonia    "several times" (02/20/2017)   Prolapsed disk 10/28   Pulmonary embolism (HCC) 02/20/2017   Scoliosis 08/24/13     Assessment: Patient with acute DVT- previously on Eliquis  for PE but stopped 11/2023. Patient is also on Genvoya - strong CYP3A4 inhibitor.  Apixaban  was started this admission on 5/8 (last dose was 5mg  at ~ 9am on 5/10) and now noted with HF with plans for PICC and cath.  Goal of Therapy:  Heparin  level 0.3-0.7 aPTT 66-102 Monitor platelets by anticoagulation protocol: Yes   Plan:  -No heparin  bolus with recent apixaban  -Start heparin  1400 units/hr at 10pm -aPTT and heparin  level in 8 hrs  Baxter Limber,  PharmD Clinical Pharmacist **Pharmacist phone directory can now be found on [email or web address] ([email or web address]).  Listed under [facility].

## 2024-03-06 NOTE — Consult Note (Signed)
 Advanced Heart Failure Team Consult Note   Primary Physician: Patient, No Pcp Per Cardiologist:  None  Reason for Consultation: CHF  HPI:    Devin [name] is seen today for evaluation of CHF at the request of Dr. Mallipeddi.   63 y.o. with history of HIV since 1995 on treatment with Genvoya , prior PE, COPD/active smoker, HTN, back pain/back surgery, and methamphetamine abuse.  He has no longer been taking anticoagulation.  Main complaint generally has been low back pain.  Started methamphetamine about 1.5 years ago to self-medicate when he could no longer get narcotics for his back. He reports left-sided chest pain for several months, not clearly associated with exertion.  For about a week, he reports peripheral edema, dyspnea, and orthopnea.  He came to the [facility] ER with these symptoms. CTA chest with no PE but venous dopplers showed left-sided DVT (?acute vs chronic). UDS was negative. BNP 1823.  HS-TnI 66 => 64.  He was admitted for diagnosis and management of heart failure.  Echo showed EF < 20%, severe RV dysfunction, mild RV enlargement.  He was transferred to [facility] for further management.  He is now on Lasix  gtt 15 mg/hr with good UOP overnight.  Lower extremity venous dopplers showed left-sided DVT. Now on Eliquis .   Home Medications Prior to Admission medications   Medication Sig Start Date End Date Taking? Authorizing Provider  albuterol  (VENTOLIN  HFA) 108 (90 Base) MCG/ACT inhaler Inhale 2 puffs into the lungs every 4 (four) hours as needed for wheezing or shortness of breath. Patient not taking: Reported on 03/04/2024 02/12/24   Sandie Cross, MD  atorvastatin  (LIPITOR) 10 MG tablet Take 1 tablet (10 mg total) by mouth daily. Patient not taking: Reported on 03/04/2024 12/26/23   Sandie Cross, MD  cyclobenzaprine  (FLEXERIL ) 10 MG tablet Take 10 mg by mouth 3 (three) times daily as needed for muscle spasms. Patient not taking: Reported on 03/04/2024    [provider]  diazepam  (VALIUM ) 10 MG tablet Take 10 mg by mouth in the morning and at bedtime. Patient not taking: Reported on 03/04/2024    [provider]  docusate sodium  (COLACE) 100 MG capsule Take 1 capsule (100 mg total) by mouth 2 (two) times daily. Patient not taking: Reported on 06/16/2023 06/03/23   Silas Drivers, MD  DULoxetine  (CYMBALTA ) 60 MG capsule Take 1 capsule (60 mg total) by mouth daily. Patient not taking: Reported on 03/04/2024 12/26/23   Sandie Cross, MD  elvitegravir-cobicistat-emtricitabine -tenofovir  (GENVOYA ) 150-150-200-10 MG TABS tablet Take 1 tablet by mouth daily with breakfast. Patient not taking: Reported on 03/04/2024 12/26/23   Sandie Cross, MD  gabapentin  (NEURONTIN ) 100 MG capsule Take 2 capsules (200 mg total) by mouth 3 (three) times daily. Patient not taking: Reported on 03/04/2024 06/26/23   Juliana Ocean, DO  hydrOXYzine  (ATARAX ) 25 MG tablet Take 1 tablet (25 mg total) by mouth 3 (three) times daily as needed for anxiety. Patient not taking: Reported on 03/04/2024 12/26/23   Sandie Cross, MD  lidocaine  (LIDODERM ) 5 % Place 1 patch onto the skin daily. Remove & Discard patch within 12 hours or as directed by MD Patient not taking: Reported on 03/04/2024 06/26/23   Juliana Ocean, DO  methocarbamol  (ROBAXIN ) 500 MG tablet Take 1 tablet (500 mg total) by mouth 3 (three) times daily. Patient not taking: Reported on 03/04/2024 06/26/23   Juliana Ocean, DO  OLANZapine  (ZYPREXA ) 10 MG tablet Take  1 tablet (10 mg total) by mouth at bedtime. Patient not taking: Reported on 03/04/2024 12/26/23   Sandie Cross, MD  pantoprazole  (PROTONIX ) 40 MG tablet Take 1 tablet (40 mg total) by mouth daily. Patient not taking: Reported on 03/04/2024 06/26/23   Juliana Ocean, DO  risperiDONE  (RISPERDAL ) 0.5 MG tablet Take 1 tablet (0.5 mg total) by mouth 2 (two) times daily at 8 am and 4 pm. Patient not taking: Reported on  03/04/2024 06/26/23   Juliana Ocean, DO    Past Medical History: Past Medical History:  Diagnosis Date   Anxiety    Arthritis    "I'm eat up w/it" (02/20/2017)   Asthma    Chronic back pain    "the whole back" (02/20/2017)   Chronic bronchitis (HCC)    Complication of anesthesia    "felt like I couldn't breath coming out of it"   COPD (chronic obstructive pulmonary disease) (HCC)    Depression    GERD (gastroesophageal reflux disease)    History of hiatal hernia    History of stomach ulcers    "bleeding ones; I was young then"   HIV infection (HCC) dx'd ~ 1999   Hypertension    Pneumonia    "several times" (02/20/2017)   Prolapsed disk 10/28   Pulmonary embolism (HCC) 02/20/2017   Scoliosis 08/24/13    Past Surgical History: Past Surgical History:  Procedure Laterality Date   BACK SURGERY  2019   KNEE ARTHROSCOPY Right 1980s    Family History: Family History  Problem Relation Age of Onset   Diabetes Father    Stroke Other    Colon cancer Neg Hx     Social History: Social History   Socioeconomic History   Marital status: Divorced    Spouse name: Not on file   Number of children: Not on file   Years of education: Not on file   Highest education level: Not on file  Occupational History   Not on file  Tobacco Use   Smoking status: Every Day    Current packs/day: 1.00    Average packs/day: 1 pack/day for 48.0 years (48.0 ttl pk-yrs)    Types: Cigarettes   Smokeless tobacco: Never  Vaping Use   Vaping status: Never Used  Substance and Sexual Activity   Alcohol use: Not Currently    Comment: last used about 9 years ago   Drug use: Yes    Types: Marijuana    Comment: denies   Sexual activity: Yes    Partners: Female    Comment: declined condoms  Other Topics Concern   Not on file  Social History Narrative   Not on file   Social Drivers of Health   Financial Resource Strain: Not on file  Food Insecurity: No Food Insecurity (03/04/2024)    Hunger Vital Sign    Worried About Running Out of Food in the Last Year: Never true    Ran Out of Food in the Last Year: Never true  Transportation Needs: No Transportation Needs (03/04/2024)   PRAPARE - Administrator, Civil Service (Medical): No    Lack of Transportation (Non-Medical): No  Physical Activity: Not on file  Stress: Not on file  Social Connections: Unknown (03/04/2024)   Social Connection and Isolation Panel [NHANES]    Frequency of Communication with Friends and Family: More than three times a week    Frequency of Social Gatherings with Friends and Family: More than three times a week  Attends Religious Services: Never    Active Member of Clubs or Organizations: No    Attends Banker Meetings: Never    Marital Status: Not on file    Allergies:  No Known Allergies  Objective:    Vital Signs:   Temp:  [97.5 F (36.4 C)-98.1 F (36.7 C)] 97.8 F (36.6 C) (05/10 1300) Pulse Rate:  [83-110] 83 (05/10 1300) Resp:  [16-20] 20 (05/10 0731) BP: (109-132)/(69-93) 109/72 (05/10 1300) SpO2:  [90 %-96 %] 96 % (05/10 1300) Weight:  [90.6 kg] 90.6 kg (05/10 0554) Last BM Date : 03/05/24  Weight change: Filed Weights   03/04/24 1447 03/05/24 2134 03/06/24 0554  Weight: 82.6 kg 90.6 kg 90.6 kg    Intake/Output:   Intake/Output Summary (Last 24 hours) at 03/06/2024 1440 Last data filed at 03/06/2024 1407 Gross per 24 hour  Intake 501.32 ml  Output 5330 ml  Net -4828.68 ml      Physical Exam    General:  Well appearing. No resp difficulty HEENT: normal Neck: supple. JVP 12-14 cm. Carotids 2+ bilat; no bruits. No lymphadenopathy or thyromegaly appreciated. Cor: PMI lateral. Regular rate & rhythm. No rubs, gallops or murmurs. Lungs: clear Abdomen: soft, nontender, nondistended. No hepatosplenomegaly. No bruits or masses. Good bowel sounds. Extremities: no cyanosis, clubbing, rash. 1+ edema to knee on left, 1+ edema 1/2 to knee on right.   Neuro: alert & orientedx3, cranial nerves grossly intact. moves all 4 extremities w/o difficulty. Affect pleasant   Telemetry   NSR 100s (personally reviewed)  EKG    NSR with possible old ASMI (personally reviewed)  Labs   Basic Metabolic Panel: Recent Labs  Lab 03/04/24 1519 03/05/24 0415 03/06/24 1018  NA 136 138  --   K 4.9 4.0  --   CL 101 100  --   CO2 26 31  --   GLUCOSE 97 110*  --   BUN 13 15  --   CREATININE 1.26* 1.43*  --   CALCIUM  9.6 8.9  --   MG  --   --  1.7    Liver Function Tests: Recent Labs  Lab 03/05/24 1029  AST 30  ALT 22  ALKPHOS 84  BILITOT 0.7  PROT 7.2  ALBUMIN 3.7   No results for input(s): "LIPASE", "AMYLASE" in the last 168 hours. No results for input(s): "AMMONIA" in the last 168 hours.  CBC: Recent Labs  Lab 03/04/24 1519 03/05/24 0415 03/06/24 1018  WBC 4.4 4.3 6.0  NEUTROABS  --   --  3.7  HGB 14.7 12.9* 15.5  HCT 45.3 43.1 48.1  MCV 94.4 96.6 89.9  PLT 185 153 191    Cardiac Enzymes: No results for input(s): "CKTOTAL", "CKMB", "CKMBINDEX", "TROPONINI" in the last 168 hours.  BNP: BNP (last 3 results) Recent Labs    03/05/24 0415  BNP 1,823.0*    ProBNP (last 3 results) No results for input(s): "PROBNP" in the last 8760 hours.   CBG: No results for input(s): "GLUCAP" in the last 168 hours.  Coagulation Studies: No results for input(s): "LABPROT", "INR" in the last 72 hours.   Imaging   US  EKG SITE RITE Result Date: 03/06/2024 If Site Rite image not attached, placement could not be confirmed due to current cardiac rhythm.  ECHOCARDIOGRAM COMPLETE Result Date: 03/05/2024    ECHOCARDIOGRAM REPORT   Patient Name:   Devin [name] Date of Exam: 03/05/2024 Medical Rec #:  [id number]    Height:  72.0 in Accession #:    [id number]   Weight:       182.0 lb Date of Birth:  13-Jan-1961    BSA:          2.047 m Patient Age:    62 years     BP:           116/76 mmHg Patient Gender: M            HR:            110 bpm. Exam Location:  [facility] Procedure: 2D Echo, Cardiac Doppler, Color Doppler and Intracardiac            Opacification Agent (Both Spectral and Color Flow Doppler were            utilized during procedure). REPORT CONTAINS CRITICAL RESULT Indications:    R06.02 SOB  History:        Patient has no prior history of Echocardiogram examinations.                 CHF, Signs/Symptoms:Chest Pain and Altered Mental Status; Risk                 Factors:Hypertension and Current Smoker. Substance abuse.                 Pulmonary embolus. ETOH. HIV.  Sonographer:    Raynelle Callow RDCS Referring Phys: [id number] [facility] ARRIEN  Sonographer Comments: Technically difficult study due to poor echo windows. Image acquisition challenging due to uncooperative patient. IMPRESSIONS  1. No LV thrombus by Definity. Left ventricular ejection fraction, by estimation, is <20%. The left ventricle has severely decreased function. The left ventricle has no regional wall motion abnormalities. The left ventricular internal cavity size was severely dilated. Indeterminate diastolic filling due to E-A fusion. Elevated left atrial pressure.  2. Right ventricular systolic function is severely reduced. The right ventricular size is mildly enlarged. There is mildly elevated pulmonary artery systolic pressure. The estimated right ventricular systolic pressure is 37.1 mmHg.  3. Right atrial size was severely dilated.  4. The mitral valve is abnormal. No evidence of mitral valve regurgitation. No evidence of mitral stenosis. Moderate mitral annular calcification.  5. The aortic valve was not well visualized. Aortic valve regurgitation is not visualized. No aortic stenosis is present.  6. The inferior vena cava is dilated in size with <50% respiratory variability, suggesting right atrial pressure of 15 mmHg. Comparison(s): No prior Echocardiogram. FINDINGS  Left Ventricle: No LV thrombus by Definity. Left ventricular ejection fraction, by  estimation, is <20%. The left ventricle has severely decreased function. The left ventricle has no regional wall motion abnormalities. Definity contrast agent was given IV to delineate the left ventricular endocardial borders. Strain was performed and the global longitudinal strain is indeterminate. The left ventricular internal cavity size was severely dilated. There is no left ventricular hypertrophy. Indeterminate diastolic filling due to E-A fusion. Elevated left atrial pressure. Right Ventricle: The right ventricular size is mildly enlarged. No increase in right ventricular wall thickness. Right ventricular systolic function is severely reduced. There is mildly elevated pulmonary artery systolic pressure. The tricuspid regurgitant velocity is 2.35 m/s, and with an assumed right atrial pressure of 15 mmHg, the estimated right ventricular systolic pressure is 37.1 mmHg. Left Atrium: Left atrial size was normal in size. Right Atrium: Right atrial size was severely dilated. Pericardium: There is no evidence of pericardial effusion. Mitral Valve: The mitral valve is abnormal. Moderate mitral annular calcification.  No evidence of mitral valve regurgitation. No evidence of mitral valve stenosis. Tricuspid Valve: The tricuspid valve is normal in structure. Tricuspid valve regurgitation is mild . No evidence of tricuspid stenosis. Aortic Valve: The aortic valve was not well visualized. Aortic valve regurgitation is not visualized. No aortic stenosis is present. Pulmonic Valve: The pulmonic valve was not well visualized. Pulmonic valve regurgitation is not visualized. No evidence of pulmonic stenosis. Aorta: The aortic root is normal in size and structure. Venous: The inferior vena cava is dilated in size with less than 50% respiratory variability, suggesting right atrial pressure of 15 mmHg. IAS/Shunts: No atrial level shunt detected by color flow Doppler. Additional Comments: 3D was performed not requiring image post  processing on an independent workstation and was indeterminate.  LEFT VENTRICLE PLAX 2D LVIDd:         6.90 cm      Diastology LVIDs:         6.10 cm      LV e' medial:    6.96 cm/s LV PW:         1.00 cm      LV E/e' medial:  9.7 LV IVS:        1.20 cm      LV e' lateral:   10.10 cm/s LVOT diam:     2.30 cm      LV E/e' lateral: 6.7 LV SV:         30 LV SV Index:   15 LVOT Area:     4.15 cm  LV Volumes (MOD) LV vol d, MOD A2C: 165.0 ml LV vol d, MOD A4C: 156.5 ml LV vol s, MOD A2C: 137.0 ml LV vol s, MOD A4C: 144.5 ml LV SV MOD A2C:     28.0 ml LV SV MOD A4C:     156.5 ml LV SV MOD BP:      21.5 ml RIGHT VENTRICLE            IVC RV S prime:     5.44 cm/s  IVC diam: 3.00 cm TAPSE (M-mode): 1.5 cm LEFT ATRIUM             Index        RIGHT ATRIUM           Index LA diam:        4.20 cm 2.05 cm/m   RA Area:     29.80 cm LA Vol (A2C):   61.4 ml 30.00 ml/m  RA Volume:   112.00 ml 54.72 ml/m LA Vol (A4C):   58.2 ml 28.43 ml/m LA Biplane Vol: 60.1 ml 29.36 ml/m  AORTIC VALVE LVOT Vmax:   60.60 cm/s LVOT Vmean:  37.700 cm/s LVOT VTI:    0.073 m  AORTA Ao Root diam: 3.60 cm MITRAL VALVE               TRICUSPID VALVE MV Area (PHT): 5.42 cm    TR Peak grad:   22.1 mmHg MV Decel Time: 140 msec    TR Vmax:        235.00 cm/s MV E velocity: 67.70 cm/s MV A velocity: 30.14 cm/s  SHUNTS MV E/A ratio:  2.25        Systemic VTI:  0.07 m                            Systemic Diam: 2.30 cm Vishnu Priya Mallipeddi Electronically signed by Lucetta Russel  Mallipeddi Signature Date/Time: 03/05/2024/4:13:22 PM    Final      Medications:     Current Medications:  apixaban   5 mg Oral BID   Followed by   Cecily Cohen ON 03/11/2024] apixaban   2.5 mg Oral BID   atorvastatin   10 mg Oral Daily   digoxin  0.125 mg Oral Daily   DULoxetine   60 mg Oral Daily   elvitegravir-cobicistat-emtricitabine -tenofovir   1 tablet Oral Q breakfast   gabapentin   200 mg Oral TID   OLANZapine   10 mg Oral QHS   pantoprazole   40 mg Oral Daily    spironolactone  12.5 mg Oral Daily    Infusions:  furosemide  (LASIX ) 200 mg in dextrose 5 % 100 mL (2 mg/mL) infusion 15 mg/hr (03/06/24 0357)   magnesium  sulfate bolus IVPB       Assessment/Plan   1. Acute systolic CHF: Patient presented with CHF, symptoms severely for about a week.  Echo showed EF < 20%, severe RV dysfunction, mild RV enlargement.  No prior history of CHF.  HS-TnI mildly elevated with no trend, suspect demand ischemia from volume overload.  He has had atypical chest pain and he is a smoker, has anteroseptal Qs. No strong family history of CAD or cardiomyopathy.  Of note, he has regularly used methamphetamine for about a year though UDS was negative this admission.  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  Patient remains volume overloaded on exam. No BMET today.  - Continue Lasix  gtt 15 mg/hr, needs stat BMET.  - Start digoxin 0.125 daily.  - Start spironolactone 12.5 daily.  - I will place PICC to follow CVP and co-ox, extremities cool and LV function very poor on echo => assess for low output HF.  Can use milrinone to aid diuresis if co-ox low.   - He will need LHC/RHC after diuresis to rule out coronary disease as cause of cardiomyopathy and for formal evaluation of hemodynamics.  - If no significant CAD on cath, he will need cardiac MRI.  - Not advanced therapies candidate at this time with active methamphetamine abuse.  2. PE/DVT: Patient has history of prior PE.  CTA chest this admission was negative but venous dopplers showed LLE DVT.  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  - Will check Factor V Leiden, prothrombin gene mutation, lupus anticoagulant.  - Will use heparin  gtt for anticoagulation for now until now to allow cath Monday.  3. HIV: Continue home anti-retrovirals.  4. Smoking/COPD: Counseled cessation.  5. Methamphetamine: Counseled cessation.   Length of Stay: 1  Peder Bourdon, MD  03/06/2024, 2:40 PM    Advanced  Heart Failure Team Pager [phone] (M-F; 7a - 5p)  Please contact [facility] for night-coverage after hours (4p -7a ) and weekends on [email or web address]

## 2024-03-07 DIAGNOSIS — I5021 Acute systolic (congestive) heart failure: Secondary | ICD-10-CM | POA: Diagnosis not present

## 2024-03-07 LAB — CBC
HCT: 46.9 % (ref 39.0–52.0)
Hemoglobin: 15.2 g/dL (ref 13.0–17.0)
MCH: 29 pg (ref 26.0–34.0)
MCHC: 32.4 g/dL (ref 30.0–36.0)
MCV: 89.5 fL (ref 80.0–100.0)
Platelets: 176 10*3/uL (ref 150–400)
RBC: 5.24 MIL/uL (ref 4.22–5.81)
RDW: 12.5 % (ref 11.5–15.5)
WBC: 6.3 10*3/uL (ref 4.0–10.5)
nRBC: 0 % (ref 0.0–0.2)

## 2024-03-07 LAB — BASIC METABOLIC PANEL WITH GFR
Anion gap: 12 (ref 5–15)
BUN: 15 mg/dL (ref 6–20)
CO2: 34 mmol/L — ABNORMAL HIGH (ref 22–32)
Calcium: 8.9 mg/dL (ref 8.9–10.3)
Chloride: 91 mmol/L — ABNORMAL LOW (ref 98–111)
Creatinine, Ser: 1.41 mg/dL — ABNORMAL HIGH (ref 0.61–1.24)
GFR, Estimated: 57 mL/min — ABNORMAL LOW (ref >60)
Glucose, Bld: 111 mg/dL — ABNORMAL HIGH (ref 70–99)
Potassium: 3.3 mmol/L — ABNORMAL LOW (ref 3.5–5.1)
Sodium: 137 mmol/L (ref 135–145)

## 2024-03-07 LAB — COOXEMETRY PANEL
Carboxyhemoglobin: 2.1 % — ABNORMAL HIGH (ref 0.5–1.5)
Methemoglobin: 0.7 % (ref 0.0–1.5)
O2 Saturation: 60 %
Total hemoglobin: 15.7 g/dL (ref 12.0–16.0)

## 2024-03-07 LAB — HEPARIN LEVEL (UNFRACTIONATED)
Heparin Unfractionated: >1.1 [IU]/mL — ABNORMAL HIGH (ref 0.30–0.70)
Heparin Unfractionated: 1.09 [IU]/mL — ABNORMAL HIGH (ref 0.30–0.70)

## 2024-03-07 LAB — APTT
aPTT: 66 s — ABNORMAL HIGH (ref 24–36)
aPTT: 67 s — ABNORMAL HIGH (ref 24–36)

## 2024-03-07 LAB — BRAIN NATRIURETIC PEPTIDE: B Natriuretic Peptide: 680.5 pg/mL — ABNORMAL HIGH (ref 0.0–100.0)

## 2024-03-07 MED ORDER — SPIRONOLACTONE 25 MG PO TABS
25.0000 mg | ORAL_TABLET | Freq: Every day | ORAL | Status: DC
  Administered 2024-03-08 – 2024-03-10 (×3): 25 mg via ORAL
  Filled 2024-03-07 (×3): qty 1

## 2024-03-07 MED ORDER — ASPIRIN 81 MG PO CHEW: 81.0000 mg | CHEWABLE_TABLET | ORAL | Status: DC

## 2024-03-07 MED ORDER — POTASSIUM CHLORIDE CRYS ER 20 MEQ PO TBCR
40.0000 meq | EXTENDED_RELEASE_TABLET | Freq: Once | ORAL | Status: AC
  Administered 2024-03-07: 40 meq via ORAL
  Filled 2024-03-07: qty 2

## 2024-03-07 MED ORDER — POTASSIUM CHLORIDE CRYS ER 20 MEQ PO TBCR
40.0000 meq | EXTENDED_RELEASE_TABLET | Freq: Once | ORAL | Status: DC
Start: 2024-03-07 — End: 2024-03-07

## 2024-03-07 MED ORDER — SODIUM CHLORIDE 0.9 % IV SOLN: INTRAVENOUS | Status: DC

## 2024-03-07 MED ORDER — SPIRONOLACTONE 12.5 MG HALF TABLET
12.5000 mg | ORAL_TABLET | Freq: Once | ORAL | Status: AC
  Administered 2024-03-07: 12.5 mg via ORAL
  Filled 2024-03-07: qty 1

## 2024-03-07 MED ORDER — DAPAGLIFLOZIN PROPANEDIOL 10 MG PO TABS
10.0000 mg | ORAL_TABLET | Freq: Every day | ORAL | Status: DC
  Administered 2024-03-07 – 2024-03-11 (×5): 10 mg via ORAL
  Filled 2024-03-07 (×5): qty 1

## 2024-03-07 NOTE — Progress Notes (Signed)
 Patient ID: Devin [name], male   DOB: [birth year], 63 y.o.   MRN: [id number]     Advanced Heart Failure Rounding Note  Cardiologist: None  Chief Complaint: CHF  Subjective:    Patient feels much better today.  Co-ox 65%, I/Os net negative 3385 on Lasix  gtt 15 mg/hr.  Co-ox 62% => 60%. Creatinine 1.5 => 1.4.  CVP 4-5 today.    Objective:   Weight Range: 79.2 kg Body mass index is 23.68 kg/m.   Vital Signs:   Temp:  [97.5 F (36.4 C)-97.9 F (36.6 C)] 97.9 F (36.6 C) (05/11 1300) Pulse Rate:  [100-107] 107 (05/11 1300) Resp:  [18] 18 (05/11 1300) BP: (93-123)/(71-94) 117/94 (05/11 1300) SpO2:  [87 %-93 %] 92 % (05/11 1300) Weight:  [79.2 kg] 79.2 kg (05/11 0341) Last BM Date : 03/05/24  Weight change: Filed Weights   03/05/24 2134 03/06/24 0554 03/07/24 0341  Weight: 90.6 kg 90.6 kg 79.2 kg    Intake/Output:   Intake/Output Summary (Last 24 hours) at 03/07/2024 1327 Last data filed at 03/07/2024 1321 Gross per 24 hour  Intake 1804.83 ml  Output 3350 ml  Net -1545.17 ml      Physical Exam    General:  Well appearing. No resp difficulty HEENT: Normal Neck: Supple. JVP not elevated. Carotids 2+ bilat; no bruits. No lymphadenopathy or thyromegaly appreciated. Cor: PMI lateral. Regular rate & rhythm. No rubs, gallops or murmurs. Lungs: Clear Abdomen: Soft, nontender, nondistended. No hepatosplenomegaly. No bruits or masses. Good bowel sounds. Extremities: No cyanosis, clubbing, rash, edema Neuro: Alert & orientedx3, cranial nerves grossly intact. moves all 4 extremities w/o difficulty. Affect pleasant   Telemetry   NSR 70s (personally reviewed)  Labs    CBC Recent Labs    03/06/24 1018 03/07/24 0600  WBC 6.0 6.3  NEUTROABS 3.7  --   HGB 15.5 15.2  HCT 48.1 46.9  MCV 89.9 89.5  PLT 191 176   Basic Metabolic Panel Recent Labs    40/98/11 1018 03/06/24 1519 03/07/24 0943  NA  --  138 137  K  --  3.0* 3.3*  CL  --  89* 91*  CO2  --  35* 34*   GLUCOSE  --  225* 111*  BUN  --  15 15  CREATININE  --  1.50* 1.41*  CALCIUM   --  9.4 8.9  MG 1.7  --   --    Liver Function Tests Recent Labs    03/05/24 1029  AST 30  ALT 22  ALKPHOS 84  BILITOT 0.7  PROT 7.2  ALBUMIN 3.7   No results for input(s): "LIPASE", "AMYLASE" in the last 72 hours. Cardiac Enzymes No results for input(s): "CKTOTAL", "CKMB", "CKMBINDEX", "TROPONINI" in the last 72 hours.  BNP: BNP (last 3 results) Recent Labs    03/05/24 0415 03/07/24 0600  BNP 1,823.0* 680.5*    ProBNP (last 3 results) No results for input(s): "PROBNP" in the last 8760 hours.   D-Dimer No results for input(s): "DDIMER" in the last 72 hours. Hemoglobin A1C No results for input(s): "HGBA1C" in the last 72 hours. Fasting Lipid Panel No results for input(s): "CHOL", "HDL", "LDLCALC", "TRIG", "CHOLHDL", "LDLDIRECT" in the last 72 hours. Thyroid Function Tests Recent Labs    03/05/24 0415  TSH 8.710*    Other results:   Imaging    US  EKG SITE RITE Result Date: 03/06/2024 If Site Rite image not attached, placement could not be confirmed due to current cardiac rhythm.  Medications:     Scheduled Medications:  atorvastatin   10 mg Oral Daily   Chlorhexidine Gluconate Cloth  6 each Topical Daily   dapagliflozin propanediol  10 mg Oral Daily   digoxin  0.125 mg Oral Daily   DULoxetine   60 mg Oral Daily   elvitegravir-cobicistat-emtricitabine -tenofovir   1 tablet Oral Q breakfast   gabapentin   200 mg Oral TID   OLANZapine   10 mg Oral QHS   pantoprazole   40 mg Oral Daily   potassium chloride   40 mEq Oral Once   potassium chloride   40 mEq Oral Once   sodium chloride  flush  10-40 mL Intracatheter Q12H   spironolactone  12.5 mg Oral Once   [START ON 03/08/2024] spironolactone  25 mg Oral Daily    Infusions:  heparin  1,400 Units/hr (03/06/24 2230)    PRN Medications: acetaminophen  **OR** acetaminophen , albuterol , cyclobenzaprine , hydrOXYzine , LORazepam ,  ondansetron  **OR** ondansetron  (ZOFRAN ) IV, oxyCODONE , sodium chloride  flush   Assessment/Plan   1. Acute systolic CHF: Patient presented with CHF, symptoms severely for about a week.  Echo showed EF < 20%, severe RV dysfunction, mild RV enlargement.  No prior history of CHF.  HS-TnI mildly elevated with no trend, suspect demand ischemia from volume overload.  He has had atypical chest pain and he is a smoker, has anteroseptal Qs. No strong family history of CAD or cardiomyopathy.  Of note, he has regularly used methamphetamine for about a year though UDS was negative this admission.  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  Good diuresis with Lasix  gtt, CVP down to 4-5.  Co-ox 60%, milrinone was not started.  - Continue digoxin 0.125 daily.  - Increase spironolactone to 25 mg daily.  - Start Farxiga 10 mg daily.   - He will need LHC/RHC after diuresis to rule out coronary disease as cause of cardiomyopathy and for formal evaluation of hemodynamics => will aim for tomorrow.  Discussed risks/benefits with patient and he agrees to procedure.  - If no significant CAD on cath, he will need cardiac MRI.  - Not advanced therapies candidate at this time with active methamphetamine abuse.  2. PE/DVT: Patient has history of prior PE.  CTA chest this admission was negative but venous dopplers showed LLE DVT.  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  - Will check Factor V Leiden, prothrombin gene mutation, lupus anticoagulant.  - Will use heparin  gtt for anticoagulation for now until now to allow cath Monday.  3. HIV: Continue home anti-retrovirals.  4. Smoking/COPD: Counseled cessation.  5. Methamphetamine: Counseled cessation.   Length of Stay: 2  Peder Bourdon, MD  03/07/2024, 1:27 PM  Advanced Heart Failure Team Pager [phone] (M-F; 7a - 5p)  Please contact [facility] for night-coverage after hours (5p -7a ) and weekends on [email or web address]

## 2024-03-07 NOTE — Progress Notes (Signed)
 Progress Note    Devin [name]   NWG:[id number]  DOB: 1961-10-21  DOA: 03/04/2024     2 PCP: Patient, No Pcp Per  Initial CC: SOB  Hospital Course: Devin [name] is a 63 y.o. male with medical history significant of COPD, hypertension, history of pulmonary embolism, HIV infection and scoliosis who presented with lower extremity edema and dyspnea.  Patient reported 4 weeks of worsening lower extremity edema, but more severe over the last 2 days. It has been associated with exertional dyspnea but not frank angina symptoms.  He had an episode of severe episode of paroxysmal nocturnal dyspnea.  [place]  He was then transported to the ER via EMS.   He sustained a fall also 2 days prior, hitting his right shoulder, since then, his pain has been constant.  Apparently he is not taking any medications and has not seen his primary care provider in a long time.    Reports using methamphetamines (crystal meth) for pain over last several weeks.   Assessment and Plan:  Acute systolic heart failure (HCC) Patient with signs of volume overload.  - Requiring 2.5 L of oxygen, satting 92%, -BNP 1823, creatinine 1.43 - CXR noted with pleural effusions -Cardiology following, appreciate assistance -Echo obtained showing EF less than 20%, no RWMA, indeterminate diastolic filling - lasix , digoxin, spironolactone, Farxiga - Tentative plan for left and right heart cath on Monday; if negative cath, cardiac MRI next step per cardiology  Troponin elevation  - Denies of any chest pain, no EKG changes -Troponin remained flat 64, 66 - suspected demand insetting of severely reduced EF and CHF exac   AKI  - normal baseline  - monitor while on diuresis  History of pulmonary embolism LLE DVT - Partially occlusive peripheral thrombus in the distal left femoral vein and popliteal vein. This could be partially occlusive acute thrombus or recanalized chronic  thrombus. - CTA chest negative for PE - Eliquis  on hold - Continue heparin  drip until procedures completed  Essential hypertension, benign -Monitoring BP -Holding ARB's  Human immunodeficiency virus (HIV) disease (HCC) - continue Genvoya     GERD   - Continue pantoprazole     Substance abuse with hallucination and paranoia - Patient has been using methamphetamines; negative UDS on admission; states has not used in over a week - PRN ativan     History of alcohol abuse, with no signs of acute withdrawal.  -Consulted psych for evaluation and recommendations  Right shoulder pain, continue oral analgesics     Interval History:  More comfortable today and resting in bed.  Minor leg cramping but overall improved.  Denies any chest pain or shortness of breath.  Also less paranoid today but still somewhat aggravated in general.   Old records reviewed in assessment of this patient  Antimicrobials:   DVT prophylaxis:  SCDs Start: 03/04/24 1935   Code Status:   Code Status: Full Code  Mobility Assessment (Last 72 Hours)     Mobility Assessment     Row Name 03/07/24 0800 03/06/24 2046 03/06/24 0731 03/05/24 2100 03/05/24 1100   Does patient have an order for bedrest or is patient medically unstable No - Continue assessment No - Continue assessment No - Continue assessment No - Continue assessment No - Continue assessment   What is the highest level of mobility based on the progressive mobility assessment? Level 5 (Walks with assist in room/hall) - Balance while stepping forward/back and can walk in  room with assist - Complete Level 6 (Walks independently in room and hall) - Balance while walking in room without assist - Complete Level 6 (Walks independently in room and hall) - Balance while walking in room without assist - Complete Level 6 (Walks independently in room and hall) - Balance while walking in room without assist - Complete Level 6 (Walks independently in room and hall) -  Balance while walking in room without assist - Complete    Row Name 03/04/24 2130           Does patient have an order for bedrest or is patient medically unstable No - Continue assessment       What is the highest level of mobility based on the progressive mobility assessment? Level 6 (Walks independently in room and hall) - Balance while walking in room without assist - Complete                Barriers to discharge: none Disposition Plan:  Home HH orders placed: n/a Status is: Inpt  Objective: Blood pressure (!) 117/94, pulse (!) 107, temperature 97.9 F (36.6 C), temperature source Oral, resp. rate 18, height 6' (1.829 m), weight 79.2 kg, SpO2 92%.  Examination:  Physical Exam Constitutional:      General: He is not in acute distress.    Appearance: Normal appearance.  HENT:     Head: Normocephalic and atraumatic.     Mouth/Throat:     Mouth: Mucous membranes are moist.  Eyes:     Extraocular Movements: Extraocular movements intact.  Cardiovascular:     Rate and Rhythm: Normal rate and regular rhythm.  Pulmonary:     Effort: Pulmonary effort is normal. No respiratory distress.     Breath sounds: Normal breath sounds. No wheezing.  Abdominal:     General: Bowel sounds are normal. There is no distension.     Palpations: Abdomen is soft.     Tenderness: There is no abdominal tenderness.  Musculoskeletal:        General: Normal range of motion.     Cervical back: Normal range of motion and neck supple.     Right lower leg: Edema present.     Left lower leg: Edema present.  Skin:    General: Skin is warm and dry.  Neurological:     General: No focal deficit present.     Mental Status: He is alert.  Psychiatric:        Mood and Affect: Mood is anxious.      Consultants:  Cardiology  Procedures:    Data Reviewed: Results for orders placed or performed during the hospital encounter of 03/04/24 (from the past 24 hours)  Basic metabolic panel     Status:  Abnormal   Collection Time: 03/06/24  3:19 PM  Result Value Ref Range   Sodium 138 135 - 145 mmol/L   Potassium 3.0 (L) 3.5 - 5.1 mmol/L   Chloride 89 (L) 98 - 111 mmol/L   CO2 35 (H) 22 - 32 mmol/L   Glucose, Bld 225 (H) 70 - 99 mg/dL   BUN 15 6 - 20 mg/dL   Creatinine, Ser 7.82 (H) 0.61 - 1.24 mg/dL   Calcium  9.4 8.9 - 10.3 mg/dL   GFR, Estimated 53 (L) >60 mL/min   Anion gap 14 5 - 15  Cooxemetry Panel (carboxy, met, total hgb, O2 sat)     Status: Abnormal   Collection Time: 03/06/24  5:15 PM  Result Value Ref Range  Total hemoglobin 15.2 12.0 - 16.0 g/dL   O2 Saturation 16.1 %   Carboxyhemoglobin 2.3 (H) 0.5 - 1.5 %   Methemoglobin <0.7 0.0 - 1.5 %  Brain natriuretic peptide     Status: Abnormal   Collection Time: 03/07/24  6:00 AM  Result Value Ref Range   B Natriuretic Peptide 680.5 (H) 0.0 - 100.0 pg/mL  CBC     Status: None   Collection Time: 03/07/24  6:00 AM  Result Value Ref Range   WBC 6.3 4.0 - 10.5 K/uL   RBC 5.24 4.22 - 5.81 MIL/uL   Hemoglobin 15.2 13.0 - 17.0 g/dL   HCT 09.6 04.5 - 40.9 %   MCV 89.5 80.0 - 100.0 fL   MCH 29.0 26.0 - 34.0 pg   MCHC 32.4 30.0 - 36.0 g/dL   RDW 81.1 91.4 - 78.2 %   Platelets 176 150 - 400 K/uL   nRBC 0.0 0.0 - 0.2 %  Cooxemetry Panel (carboxy, met, total hgb, O2 sat)     Status: Abnormal   Collection Time: 03/07/24  6:45 AM  Result Value Ref Range   Total hemoglobin 15.7 12.0 - 16.0 g/dL   O2 Saturation 60 %   Carboxyhemoglobin 2.1 (H) 0.5 - 1.5 %   Methemoglobin 0.7 0.0 - 1.5 %  APTT     Status: Abnormal   Collection Time: 03/07/24  9:43 AM  Result Value Ref Range   aPTT 66 (H) 24 - 36 seconds  Heparin  level (unfractionated)     Status: Abnormal   Collection Time: 03/07/24  9:43 AM  Result Value Ref Range   Heparin  Unfractionated >1.10 (H) 0.30 - 0.70 IU/mL  Basic metabolic panel with GFR     Status: Abnormal   Collection Time: 03/07/24  9:43 AM  Result Value Ref Range   Sodium 137 135 - 145 mmol/L   Potassium  3.3 (L) 3.5 - 5.1 mmol/L   Chloride 91 (L) 98 - 111 mmol/L   CO2 34 (H) 22 - 32 mmol/L   Glucose, Bld 111 (H) 70 - 99 mg/dL   BUN 15 6 - 20 mg/dL   Creatinine, Ser 9.56 (H) 0.61 - 1.24 mg/dL   Calcium  8.9 8.9 - 10.3 mg/dL   GFR, Estimated 57 (L) >60 mL/min   Anion gap 12 5 - 15    I have reviewed pertinent nursing notes, vitals, labs, and images as necessary. I have ordered labwork to follow up on as indicated.  I have reviewed the last notes from staff over past 24 hours. I have discussed patient's care plan and test results with nursing staff, CM/SW, and other staff as appropriate.  Time spent: Greater than 50% of the 55 minute visit was spent in counseling/coordination of care for the patient as laid out in the A&P.   LOS: 2 days   Faith Homes, MD [facility] 03/07/2024, 2:38 PM

## 2024-03-07 NOTE — Plan of Care (Signed)
  Problem: Education: Goal: Knowledge of General Education information will improve Description: Including pain rating scale, medication(s)/side effects and non-pharmacologic comfort measures Outcome: Progressing   Problem: Clinical Measurements: Goal: Will remain free from infection Outcome: Progressing   Problem: Activity: Goal: Risk for activity intolerance will decrease Outcome: Progressing   Problem: Elimination: Goal: Will not experience complications related to urinary retention Outcome: Progressing   Problem: Clinical Measurements: Goal: Ability to maintain clinical measurements within normal limits will improve Outcome: Not Progressing   Problem: Coping: Goal: Level of anxiety will decrease Outcome: Not Progressing   Problem: Pain Managment: Goal: General experience of comfort will improve and/or be controlled Outcome: Not Progressing

## 2024-03-07 NOTE — H&P (View-Only) (Signed)
 Patient ID: Devin [name], male   DOB: [birth year], 63 y.o.   MRN: [id number]     Advanced Heart Failure Rounding Note  Cardiologist: None  Chief Complaint: CHF  Subjective:    Patient feels much better today.  Co-ox 65%, I/Os net negative 3385 on Lasix  gtt 15 mg/hr.  Co-ox 62% => 60%. Creatinine 1.5 => 1.4.  CVP 4-5 today.    Objective:   Weight Range: 79.2 kg Body mass index is 23.68 kg/m.   Vital Signs:   Temp:  [97.5 F (36.4 C)-97.9 F (36.6 C)] 97.9 F (36.6 C) (05/11 1300) Pulse Rate:  [100-107] 107 (05/11 1300) Resp:  [18] 18 (05/11 1300) BP: (93-123)/(71-94) 117/94 (05/11 1300) SpO2:  [87 %-93 %] 92 % (05/11 1300) Weight:  [79.2 kg] 79.2 kg (05/11 0341) Last BM Date : 03/05/24  Weight change: Filed Weights   03/05/24 2134 03/06/24 0554 03/07/24 0341  Weight: 90.6 kg 90.6 kg 79.2 kg    Intake/Output:   Intake/Output Summary (Last 24 hours) at 03/07/2024 1327 Last data filed at 03/07/2024 1321 Gross per 24 hour  Intake 1804.83 ml  Output 3350 ml  Net -1545.17 ml      Physical Exam    General:  Well appearing. No resp difficulty HEENT: Normal Neck: Supple. JVP not elevated. Carotids 2+ bilat; no bruits. No lymphadenopathy or thyromegaly appreciated. Cor: PMI lateral. Regular rate & rhythm. No rubs, gallops or murmurs. Lungs: Clear Abdomen: Soft, nontender, nondistended. No hepatosplenomegaly. No bruits or masses. Good bowel sounds. Extremities: No cyanosis, clubbing, rash, edema Neuro: Alert & orientedx3, cranial nerves grossly intact. moves all 4 extremities w/o difficulty. Affect pleasant   Telemetry   NSR 70s (personally reviewed)  Labs    CBC Recent Labs    03/06/24 1018 03/07/24 0600  WBC 6.0 6.3  NEUTROABS 3.7  --   HGB 15.5 15.2  HCT 48.1 46.9  MCV 89.9 89.5  PLT 191 176   Basic Metabolic Panel Recent Labs    40/98/11 1018 03/06/24 1519 03/07/24 0943  NA  --  138 137  K  --  3.0* 3.3*  CL  --  89* 91*  CO2  --  35* 34*   GLUCOSE  --  225* 111*  BUN  --  15 15  CREATININE  --  1.50* 1.41*  CALCIUM   --  9.4 8.9  MG 1.7  --   --    Liver Function Tests Recent Labs    03/05/24 1029  AST 30  ALT 22  ALKPHOS 84  BILITOT 0.7  PROT 7.2  ALBUMIN 3.7   No results for input(s): "LIPASE", "AMYLASE" in the last 72 hours. Cardiac Enzymes No results for input(s): "CKTOTAL", "CKMB", "CKMBINDEX", "TROPONINI" in the last 72 hours.  BNP: BNP (last 3 results) Recent Labs    03/05/24 0415 03/07/24 0600  BNP 1,823.0* 680.5*    ProBNP (last 3 results) No results for input(s): "PROBNP" in the last 8760 hours.   D-Dimer No results for input(s): "DDIMER" in the last 72 hours. Hemoglobin A1C No results for input(s): "HGBA1C" in the last 72 hours. Fasting Lipid Panel No results for input(s): "CHOL", "HDL", "LDLCALC", "TRIG", "CHOLHDL", "LDLDIRECT" in the last 72 hours. Thyroid Function Tests Recent Labs    03/05/24 0415  TSH 8.710*    Other results:   Imaging    US  EKG SITE RITE Result Date: 03/06/2024 If Site Rite image not attached, placement could not be confirmed due to current cardiac rhythm.  Medications:     Scheduled Medications:  atorvastatin   10 mg Oral Daily   Chlorhexidine Gluconate Cloth  6 each Topical Daily   dapagliflozin propanediol  10 mg Oral Daily   digoxin  0.125 mg Oral Daily   DULoxetine   60 mg Oral Daily   elvitegravir-cobicistat-emtricitabine -tenofovir   1 tablet Oral Q breakfast   gabapentin   200 mg Oral TID   OLANZapine   10 mg Oral QHS   pantoprazole   40 mg Oral Daily   potassium chloride   40 mEq Oral Once   potassium chloride   40 mEq Oral Once   sodium chloride  flush  10-40 mL Intracatheter Q12H   spironolactone  12.5 mg Oral Once   [START ON 03/08/2024] spironolactone  25 mg Oral Daily    Infusions:  heparin  1,400 Units/hr (03/06/24 2230)    PRN Medications: acetaminophen  **OR** acetaminophen , albuterol , cyclobenzaprine , hydrOXYzine , LORazepam ,  ondansetron  **OR** ondansetron  (ZOFRAN ) IV, oxyCODONE , sodium chloride  flush   Assessment/Plan   1. Acute systolic CHF: Patient presented with CHF, symptoms severely for about a week.  Echo showed EF < 20%, severe RV dysfunction, mild RV enlargement.  No prior history of CHF.  HS-TnI mildly elevated with no trend, suspect demand ischemia from volume overload.  He has had atypical chest pain and he is a smoker, has anteroseptal Qs. No strong family history of CAD or cardiomyopathy.  Of note, he has regularly used methamphetamine for about a year though UDS was negative this admission.  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  Good diuresis with Lasix  gtt, CVP down to 4-5.  Co-ox 60%, milrinone was not started.  - Continue digoxin 0.125 daily.  - Increase spironolactone to 25 mg daily.  - Start Farxiga 10 mg daily.   - He will need LHC/RHC after diuresis to rule out coronary disease as cause of cardiomyopathy and for formal evaluation of hemodynamics => will aim for tomorrow.  Discussed risks/benefits with patient and he agrees to procedure.  - If no significant CAD on cath, he will need cardiac MRI.  - Not advanced therapies candidate at this time with active methamphetamine abuse.  2. PE/DVT: Patient has history of prior PE.  CTA chest this admission was negative but venous dopplers showed LLE DVT.  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  - Will check Factor V Leiden, prothrombin gene mutation, lupus anticoagulant.  - Will use heparin  gtt for anticoagulation for now until now to allow cath Monday.  3. HIV: Continue home anti-retrovirals.  4. Smoking/COPD: Counseled cessation.  5. Methamphetamine: Counseled cessation.   Length of Stay: 2  Peder Bourdon, MD  03/07/2024, 1:27 PM  Advanced Heart Failure Team Pager [phone] (M-F; 7a - 5p)  Please contact [facility] for night-coverage after hours (5p -7a ) and weekends on [email or web address]

## 2024-03-07 NOTE — Progress Notes (Signed)
 PHARMACY - ANTICOAGULATION CONSULT NOTE  Pharmacy Consult for Eliquis > heparin   Indication: DVT  No Known Allergies  Patient Measurements: Height: 6' (182.9 cm) Weight: 79.2 kg (174 lb 9.7 oz) IBW/kg (Calculated) : 77.6 HEPARIN  DW (KG): 90.6  Vital Signs: Temp: 97.9 F (36.6 C) (05/11 1711) Temp Source: Oral (05/11 1711) BP: 94/71 (05/11 1711) Pulse Rate: 104 (05/11 1711)  Labs: Recent Labs    03/05/24 0415 03/06/24 1018 03/06/24 1519 03/07/24 0600 03/07/24 0943 03/07/24 1806  HGB 12.9* 15.5  --  15.2  --   --   HCT 43.1 48.1  --  46.9  --   --   PLT 153 191  --  176  --   --   APTT  --   --   --   --  66* 67*  HEPARINUNFRC  --   --   --   --  >1.10* 1.09*  CREATININE 1.43*  --  1.50*  --  1.41*  --     Estimated Creatinine Clearance: 61.2 mL/min (A) (by C-G formula based on SCr of 1.41 mg/dL (H)).   Medical History: Past Medical History:  Diagnosis Date   Anxiety    Arthritis    "I'm eat up w/it" (02/20/2017)   Asthma    Chronic back pain    "the whole back" (02/20/2017)   Chronic bronchitis (HCC)    Complication of anesthesia    "felt like I couldn't breath coming out of it"   COPD (chronic obstructive pulmonary disease) (HCC)    Depression    GERD (gastroesophageal reflux disease)    History of hiatal hernia    History of stomach ulcers    "bleeding ones; I was young then"   HIV infection (HCC) dx'd ~ 1999   Hypertension    Pneumonia    "several times" (02/20/2017)   Prolapsed disk 10/28   Pulmonary embolism (HCC) 02/20/2017   Scoliosis 08/24/13     Assessment: Patient with acute DVT- previously on Eliquis  for PE but stopped 11/2023. Patient is also on Genvoya - strong CYP3A4 inhibitor.  Apixaban  was started this admission on 5/8 (last dose was 5mg  at ~ 9am on 5/10) and now noted with HF with plans for PICC and cath. Pharmacy consulted to dose IV heparin .   - aPTT 67 and at goal on 1400 units/hr.   Goal of Therapy:  Heparin  level 0.3-0.7 aPTT  66-102 Monitor platelets by anticoagulation protocol: Yes   Plan:  Continue heparin  gtt at 1400 units/hr  Daily heparin  level, aPTT and CBC  Baxter Limber, PharmD Clinical Pharmacist **Pharmacist phone directory can now be found on [email or web address] ([email or web address]).  Listed under [facility].

## 2024-03-07 NOTE — Progress Notes (Addendum)
 PHARMACY - ANTICOAGULATION CONSULT NOTE  Pharmacy Consult for Eliquis > heparin   Indication: DVT  No Known Allergies  Patient Measurements: Height: 6' (182.9 cm) Weight: 79.2 kg (174 lb 9.7 oz) IBW/kg (Calculated) : 77.6 HEPARIN  DW (KG): 90.6  Vital Signs: Temp: 97.6 F (36.4 C) (05/11 0057) Temp Source: Oral (05/11 0057) BP: 123/94 (05/11 0341) Pulse Rate: 107 (05/11 0341)  Labs: Recent Labs    03/04/24 1519 03/04/24 1815 03/05/24 0415 03/06/24 1018 03/06/24 1519 03/07/24 0600  HGB 14.7  --  12.9* 15.5  --  15.2  HCT 45.3  --  43.1 48.1  --  46.9  PLT 185  --  153 191  --  176  CREATININE 1.26*  --  1.43*  --  1.50*  --   TROPONINIHS 64* 66*  --   --   --   --     Estimated Creatinine Clearance: 57.5 mL/min (A) (by C-G formula based on SCr of 1.5 mg/dL (H)).   Medical History: Past Medical History:  Diagnosis Date   Anxiety    Arthritis    "I'm eat up w/it" (02/20/2017)   Asthma    Chronic back pain    "the whole back" (02/20/2017)   Chronic bronchitis (HCC)    Complication of anesthesia    "felt like I couldn't breath coming out of it"   COPD (chronic obstructive pulmonary disease) (HCC)    Depression    GERD (gastroesophageal reflux disease)    History of hiatal hernia    History of stomach ulcers    "bleeding ones; I was young then"   HIV infection (HCC) dx'd ~ 1999   Hypertension    Pneumonia    "several times" (02/20/2017)   Prolapsed disk 10/28   Pulmonary embolism (HCC) 02/20/2017   Scoliosis 08/24/13     Assessment: Patient with acute DVT- previously on Eliquis  for PE but stopped 11/2023. Patient is also on Genvoya - strong CYP3A4 inhibitor.  Apixaban  was started this admission on 5/8 (last dose was 5mg  at ~ 9am on 5/10) and now noted with HF with plans for PICC and cath. Pharmacy consulted to dose IV heparin .   AM HL (>1.1) elevated as expected given recent DOAC use. aPTT (66) at upper end of threshold on heparin  gtt running at 1400 units/hr.  No interruptions with heparin  infusion or signs of bleeding per RN. CBC stable  Goal of Therapy:  Heparin  level 0.3-0.7 aPTT 66-102 Monitor platelets by anticoagulation protocol: Yes   Plan:  Continue heparin  gtt at 1400 units/hr  Check aPTT & anti-Xa level in 6 hours and daily while on heparin  Continue to monitor via aPTT until levels are correlated Monitor CBC and monitor H&H   F/u plans for cath  Harvest Lineman, PharmD PGY1 Pharmacy Resident

## 2024-03-08 ENCOUNTER — Encounter (HOSPITAL_COMMUNITY)
Admission: EM | Disposition: A | Discharge status: Home / Self Care | Payer: Self-pay | Source: Home / Self Care | Attending: Internal Medicine

## 2024-03-08 ENCOUNTER — Encounter (HOSPITAL_COMMUNITY): Payer: Self-pay | Admitting: Cardiology

## 2024-03-08 ENCOUNTER — Other Ambulatory Visit (HOSPITAL_COMMUNITY): Payer: Self-pay

## 2024-03-08 ENCOUNTER — Telehealth (HOSPITAL_COMMUNITY): Payer: Self-pay | Admitting: Pharmacy Technician

## 2024-03-08 DIAGNOSIS — I429 Cardiomyopathy, unspecified: Secondary | ICD-10-CM

## 2024-03-08 DIAGNOSIS — I5021 Acute systolic (congestive) heart failure: Secondary | ICD-10-CM | POA: Diagnosis not present

## 2024-03-08 HISTORY — PX: RIGHT/LEFT HEART CATH AND CORONARY ANGIOGRAPHY: CATH118266

## 2024-03-08 LAB — BASIC METABOLIC PANEL WITH GFR
Anion gap: 11 (ref 5–15)
BUN: 17 mg/dL (ref 6–20)
CO2: 30 mmol/L (ref 22–32)
Calcium: 9 mg/dL (ref 8.9–10.3)
Chloride: 94 mmol/L — ABNORMAL LOW (ref 98–111)
Creatinine, Ser: 1.44 mg/dL — ABNORMAL HIGH (ref 0.61–1.24)
GFR, Estimated: 56 mL/min — ABNORMAL LOW (ref >60)
Glucose, Bld: 112 mg/dL — ABNORMAL HIGH (ref 70–99)
Potassium: 3.8 mmol/L (ref 3.5–5.1)
Sodium: 135 mmol/L (ref 135–145)

## 2024-03-08 LAB — POCT I-STAT EG7
Acid-Base Excess: 6 mmol/L — ABNORMAL HIGH (ref 0.0–2.0)
Acid-Base Excess: 7 mmol/L — ABNORMAL HIGH (ref 0.0–2.0)
Bicarbonate: 31.5 mmol/L — ABNORMAL HIGH (ref 20.0–28.0)
Bicarbonate: 33.1 mmol/L — ABNORMAL HIGH (ref 20.0–28.0)
Calcium, Ion: 1.11 mmol/L — ABNORMAL LOW (ref 1.15–1.40)
Calcium, Ion: 1.18 mmol/L (ref 1.15–1.40)
HCT: 45 % (ref 39.0–52.0)
HCT: 47 % (ref 39.0–52.0)
Hemoglobin: 15.3 g/dL (ref 13.0–17.0)
Hemoglobin: 16 g/dL (ref 13.0–17.0)
O2 Saturation: 61 %
O2 Saturation: 62 %
Potassium: 4 mmol/L (ref 3.5–5.1)
Potassium: 4.1 mmol/L (ref 3.5–5.1)
Sodium: 136 mmol/L (ref 135–145)
Sodium: 136 mmol/L (ref 135–145)
TCO2: 33 mmol/L — ABNORMAL HIGH (ref 22–32)
TCO2: 35 mmol/L — ABNORMAL HIGH (ref 22–32)
pCO2, Ven: 49.3 mmHg (ref 44–60)
pCO2, Ven: 52.1 mmHg (ref 44–60)
pH, Ven: 7.411 (ref 7.25–7.43)
pH, Ven: 7.413 (ref 7.25–7.43)
pO2, Ven: 32 mmHg (ref 32–45)
pO2, Ven: 32 mmHg (ref 32–45)

## 2024-03-08 LAB — PROCALCITONIN: Procalcitonin: <0.1 ng/mL

## 2024-03-08 LAB — CBC
HCT: 46.8 % (ref 39.0–52.0)
Hemoglobin: 15.2 g/dL (ref 13.0–17.0)
MCH: 29.1 pg (ref 26.0–34.0)
MCHC: 32.5 g/dL (ref 30.0–36.0)
MCV: 89.5 fL (ref 80.0–100.0)
Platelets: 171 10*3/uL (ref 150–400)
RBC: 5.23 MIL/uL (ref 4.22–5.81)
RDW: 12.6 % (ref 11.5–15.5)
WBC: 6.1 10*3/uL (ref 4.0–10.5)
nRBC: 0 % (ref 0.0–0.2)

## 2024-03-08 LAB — T3: T3, Total: 109 ng/dL (ref 71–180)

## 2024-03-08 LAB — COOXEMETRY PANEL
Carboxyhemoglobin: 2 % — ABNORMAL HIGH (ref 0.5–1.5)
Methemoglobin: 0.8 % (ref 0.0–1.5)
O2 Saturation: 72.2 %
Total hemoglobin: 15.5 g/dL (ref 12.0–16.0)

## 2024-03-08 LAB — APTT: aPTT: 59 s — ABNORMAL HIGH (ref 24–36)

## 2024-03-08 LAB — PROTIME-INR
INR: 1.1 (ref 0.8–1.2)
Prothrombin Time: 14.5 s (ref 11.4–15.2)

## 2024-03-08 LAB — BRAIN NATRIURETIC PEPTIDE: B Natriuretic Peptide: 483.3 pg/mL — ABNORMAL HIGH (ref 0.0–100.0)

## 2024-03-08 LAB — HEPARIN LEVEL (UNFRACTIONATED): Heparin Unfractionated: 0.67 [IU]/mL (ref 0.30–0.70)

## 2024-03-08 SURGERY — RIGHT/LEFT HEART CATH AND CORONARY ANGIOGRAPHY
Anesthesia: LOCAL

## 2024-03-08 MED ORDER — ENOXAPARIN SODIUM 80 MG/0.8ML IJ SOSY
80.0000 mg | PREFILLED_SYRINGE | Freq: Two times a day (BID) | INTRAMUSCULAR | Status: DC
  Administered 2024-03-08 – 2024-03-10 (×4): 80 mg via SUBCUTANEOUS
  Filled 2024-03-08 (×4): qty 0.8

## 2024-03-08 MED ORDER — WARFARIN - PHARMACIST DOSING INPATIENT: Freq: Every day | Status: DC

## 2024-03-08 MED ORDER — IOHEXOL 350 MG/ML SOLN
INTRAVENOUS | Status: DC | PRN
  Administered 2024-03-08: 30 mL

## 2024-03-08 MED ORDER — SACUBITRIL-VALSARTAN 24-26 MG PO TABS
1.0000 | ORAL_TABLET | Freq: Two times a day (BID) | ORAL | Status: DC
  Administered 2024-03-08 – 2024-03-11 (×6): 1 via ORAL
  Filled 2024-03-08 (×6): qty 1

## 2024-03-08 MED ORDER — FENTANYL CITRATE (PF) 100 MCG/2ML IJ SOLN
INTRAMUSCULAR | Status: AC
Start: 2024-03-08 — End: ?
  Filled 2024-03-08: qty 2

## 2024-03-08 MED ORDER — FENTANYL CITRATE (PF) 100 MCG/2ML IJ SOLN
INTRAMUSCULAR | Status: DC | PRN
  Administered 2024-03-08: 25 ug via INTRAVENOUS

## 2024-03-08 MED ORDER — HEPARIN SODIUM (PORCINE) 1000 UNIT/ML IJ SOLN
INTRAMUSCULAR | Status: AC
  Filled 2024-03-08: qty 10

## 2024-03-08 MED ORDER — MIDAZOLAM HCL 2 MG/2ML IJ SOLN
INTRAMUSCULAR | Status: AC
  Filled 2024-03-08: qty 2

## 2024-03-08 MED ORDER — SODIUM CHLORIDE 0.9% FLUSH: 3.0000 mL | INTRAVENOUS | Status: DC | PRN

## 2024-03-08 MED ORDER — ASPIRIN 81 MG PO CHEW
CHEWABLE_TABLET | ORAL | Status: DC | PRN
  Administered 2024-03-08: 81 mg via ORAL

## 2024-03-08 MED ORDER — HYDRALAZINE HCL 20 MG/ML IJ SOLN: 10.0000 mg | INTRAMUSCULAR | Status: AC | PRN

## 2024-03-08 MED ORDER — ACETAMINOPHEN 325 MG PO TABS: 650.0000 mg | ORAL_TABLET | ORAL | Status: DC | PRN

## 2024-03-08 MED ORDER — LIDOCAINE HCL (PF) 1 % IJ SOLN
INTRAMUSCULAR | Status: DC | PRN
  Administered 2024-03-08 (×2): 2 mL

## 2024-03-08 MED ORDER — LIDOCAINE HCL (PF) 1 % IJ SOLN
INTRAMUSCULAR | Status: AC
  Filled 2024-03-08: qty 30

## 2024-03-08 MED ORDER — HEPARIN (PORCINE) IN NACL 2000-0.9 UNIT/L-% IV SOLN
INTRAVENOUS | Status: DC | PRN
  Administered 2024-03-08: 1000 mL

## 2024-03-08 MED ORDER — SODIUM CHLORIDE 0.9 % IV SOLN: 250.0000 mL | INTRAVENOUS | Status: AC | PRN

## 2024-03-08 MED ORDER — MIDAZOLAM HCL 2 MG/2ML IJ SOLN
INTRAMUSCULAR | Status: DC | PRN
  Administered 2024-03-08: 1 mg via INTRAVENOUS

## 2024-03-08 MED ORDER — SODIUM CHLORIDE 0.9% FLUSH
3.0000 mL | Freq: Two times a day (BID) | INTRAVENOUS | Status: DC
  Administered 2024-03-08 – 2024-03-11 (×7): 3 mL via INTRAVENOUS

## 2024-03-08 MED ORDER — WARFARIN SODIUM 7.5 MG PO TABS
7.5000 mg | ORAL_TABLET | Freq: Once | ORAL | Status: AC
  Administered 2024-03-08: 7.5 mg via ORAL
  Filled 2024-03-08: qty 1

## 2024-03-08 MED ORDER — VERAPAMIL HCL 2.5 MG/ML IV SOLN
INTRAVENOUS | Status: AC
  Filled 2024-03-08: qty 2

## 2024-03-08 MED ORDER — ASPIRIN 81 MG PO CHEW
CHEWABLE_TABLET | ORAL | Status: AC
  Filled 2024-03-08: qty 1

## 2024-03-08 MED ORDER — HEPARIN SODIUM (PORCINE) 1000 UNIT/ML IJ SOLN
INTRAMUSCULAR | Status: DC | PRN
  Administered 2024-03-08: 4000 [IU] via INTRAVENOUS

## 2024-03-08 MED ORDER — VERAPAMIL HCL 2.5 MG/ML IV SOLN
INTRAVENOUS | Status: DC | PRN
  Administered 2024-03-08: 10 mL via INTRA_ARTERIAL

## 2024-03-08 MED ORDER — LABETALOL HCL 5 MG/ML IV SOLN: 10.0000 mg | INTRAVENOUS | Status: AC | PRN

## 2024-03-08 MED ORDER — ONDANSETRON HCL 4 MG/2ML IJ SOLN
4.0000 mg | Freq: Four times a day (QID) | INTRAMUSCULAR | Status: DC | PRN
Start: 2024-03-08 — End: 2024-03-11

## 2024-03-08 SURGICAL SUPPLY — 10 items
CATH 5FR JL3.5 JR4 ANG PIG MP (CATHETERS) IMPLANT
CATH BALLN WEDGE 5F 110CM (CATHETERS) IMPLANT
COVER PRB 48X5XTLSCP FOLD TPE (BAG) IMPLANT
DEVICE RAD COMP TR BAND LRG (VASCULAR PRODUCTS) IMPLANT
GLIDESHEATH SLEND SS 6F .021 (SHEATH) IMPLANT
GUIDEWIRE INQWIRE 1.5J.035X260 (WIRE) IMPLANT
KIT SYRINGE INJ CVI SPIKEX1 (MISCELLANEOUS) IMPLANT
PACK CARDIAC CATHETERIZATION (CUSTOM PROCEDURE TRAY) ×2 IMPLANT
SET ATX-X65L (MISCELLANEOUS) IMPLANT
SHEATH GLIDE SLENDER 4/5FR (SHEATH) IMPLANT

## 2024-03-08 NOTE — Interval H&P Note (Signed)
 History and Physical Interval Note:  03/08/2024 8:16 AM  Devin [name]  has presented today for surgery, with the diagnosis of unstable angina.  The various methods of treatment have been discussed with the patient and family. After consideration of risks, benefits and other options for treatment, the patient has consented to  Procedure(s): RIGHT/LEFT HEART CATH AND CORONARY ANGIOGRAPHY (N/A) as a surgical intervention.  The patient's history has been reviewed, patient examined, no change in status, stable for surgery.  I have reviewed the patient's chart and labs.  Questions were answered to the patient's satisfaction.     Egan Berkheimer [employer]

## 2024-03-08 NOTE — Progress Notes (Addendum)
 Patient ID: Devin [name], male   DOB: Oct 01, 1961, 63 y.o.   MRN: [id number]     Advanced Heart Failure Rounding Note  Cardiologist: None  Chief Complaint: CHF  Subjective:    Breathing improving but now complaining of congestion and productive cough w/ green colored sputum.   [name] much improved. CVP 5. Co-ox not collected.   1.8L in UOP yesterday. Overall net negative 6L.  Scr 1.4 K 3.8    Objective:   Weight Range: 80.7 kg Body mass index is 24.13 kg/m.   Vital Signs:   Temp:  [97.6 F (36.4 C)-98 F (36.7 C)] 97.9 F (36.6 C) (05/12 0416) Pulse Rate:  [100-107] 104 (05/12 0416) Resp:  [18-19] 18 (05/12 0416) BP: (94-123)/(59-94) 103/62 (05/12 0416) SpO2:  [90 %-93 %] 91 % (05/12 0416) Weight:  [80.7 kg] 80.7 kg (05/12 0416) Last BM Date : 03/05/24  Weight change: Filed Weights   03/06/24 0554 03/07/24 0341 03/08/24 0416  Weight: 90.6 kg 79.2 kg 80.7 kg    Intake/Output:   Intake/Output Summary (Last 24 hours) at 03/08/2024 0708 Last data filed at 03/08/2024 0418 Gross per 24 hour  Intake 480 ml  Output 1850 ml  Net -1370 ml      Physical Exam    General:  disheveled looking. No distress  HEENT: Normal Neck: Supple. JVD 7-8 cm.  Cor: PMI lateral. Regular rate & rhythm. No rubs, gallops or murmurs. Lungs: Clear Abdomen: Soft, nontender, nondistended.  Extremities: No cyanosis, clubbing, rash, trace b/l LE edema + RUE PICC Neuro: Alert & orientedx3, cranial nerves grossly intact. moves all 4 extremities w/o difficulty. Affect flat    Telemetry   Sinus tach 110s (personally reviewed)  Labs    CBC Recent Labs    03/06/24 1018 03/07/24 0600 03/08/24 0321  WBC 6.0 6.3 6.1  NEUTROABS 3.7  --   --   HGB 15.5 15.2 15.2  HCT 48.1 46.9 46.8  MCV 89.9 89.5 89.5  PLT 191 176 171   Basic Metabolic Panel Recent Labs    40/98/11 1018 03/06/24 1519 03/07/24 0943 03/08/24 0321  NA  --    < > 137 135  K  --    < > 3.3* 3.8  CL  --    < > 91* 94*   CO2  --    < > 34* 30  GLUCOSE  --    < > 111* 112*  BUN  --    < > 15 17  CREATININE  --    < > 1.41* 1.44*  CALCIUM   --    < > 8.9 9.0  MG 1.7  --   --   --    < > = values in this interval not displayed.   Liver Function Tests Recent Labs    03/05/24 1029  AST 30  ALT 22  ALKPHOS 84  BILITOT 0.7  PROT 7.2  ALBUMIN 3.7   No results for input(s): "LIPASE", "AMYLASE" in the last 72 hours. Cardiac Enzymes No results for input(s): "CKTOTAL", "CKMB", "CKMBINDEX", "TROPONINI" in the last 72 hours.  BNP: BNP (last 3 results) Recent Labs    03/05/24 0415 03/07/24 0600 03/08/24 0321  BNP 1,823.0* 680.5* 483.3*    ProBNP (last 3 results) No results for input(s): "PROBNP" in the last 8760 hours.   D-Dimer No results for input(s): "DDIMER" in the last 72 hours. Hemoglobin A1C No results for input(s): "HGBA1C" in the last 72 hours. Fasting Lipid Panel No  results for input(s): "CHOL", "HDL", "LDLCALC", "TRIG", "CHOLHDL", "LDLDIRECT" in the last 72 hours. Thyroid Function Tests No results for input(s): "TSH", "T4TOTAL", "T3FREE", "THYROIDAB" in the last 72 hours.  Invalid input(s): "FREET3"   Other results:   Imaging    No results found.    Medications:     Scheduled Medications:  aspirin   81 mg Oral Pre-Cath   atorvastatin   10 mg Oral Daily   Chlorhexidine Gluconate Cloth  6 each Topical Daily   dapagliflozin propanediol  10 mg Oral Daily   digoxin  0.125 mg Oral Daily   DULoxetine   60 mg Oral Daily   elvitegravir-cobicistat-emtricitabine -tenofovir   1 tablet Oral Q breakfast   gabapentin   200 mg Oral TID   OLANZapine   10 mg Oral QHS   pantoprazole   40 mg Oral Daily   sodium chloride  flush  10-40 mL Intracatheter Q12H   spironolactone  25 mg Oral Daily    Infusions:  sodium chloride      heparin  1,400 Units/hr (03/07/24 1449)    PRN Medications: acetaminophen  **OR** acetaminophen , albuterol , cyclobenzaprine , hydrOXYzine , LORazepam , ondansetron   **OR** ondansetron  (ZOFRAN ) IV, oxyCODONE , sodium chloride  flush   Assessment/Plan   1. Acute systolic CHF: Patient presented with CHF, symptoms severely for about a week.  Echo showed EF < 20%, severe RV dysfunction, mild RV enlargement.  No prior history of CHF.  HS-TnI mildly elevated with no trend, suspect demand ischemia from volume overload.  He has had atypical chest pain and he is a smoker, has anteroseptal Qs. No strong family history of CAD or cardiomyopathy.  Of note, he has regularly used methamphetamine for about a year though UDS was negative this admission.  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 Volume status improved w/ diuresis. CVP 5. Co-oxes have been stable.  - Plan R/LHC today to rule out coronary disease as cause of cardiomyopathy and for formal evaluation of hemodynamics  - Continue digoxin 0.125 daily.  - Continue spironolactone 25 mg daily.  - Continue Farxiga 10 mg daily.   - If no significant CAD on cath, he will need cardiac MRI.  - Not advanced therapies candidate at this time with active methamphetamine abuse.  2. PE/DVT: Patient has history of prior PE.  CTA chest this admission was negative but venous dopplers showed LLE DVT.  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  - Factor V Leiden, prothrombin gene mutation, lupus anticoagulant all pending.  - Will use heparin  gtt for anticoagulation for now until completion of procedures  3. HIV: Continue home anti-retrovirals.  4. Smoking/COPD: Counseled cessation.  5. Methamphetamine: Counseled cessation.  6. Productive Cough/Congestion: pt reports green colored sputum. WBC ok and Afebrile.  - check PCT    Length of Stay: 3  Devin Simmons, PA-C  03/08/2024, 7:08 AM  Advanced Heart Failure Team Pager [phone] (M-F; 7a - 5p)  Please contact [facility] for night-coverage after hours (5p -7a ) and weekends on [email or web address]   Patient seen with PA, I formulated the plan and agree  with the above note.   LHC/RHC today showed minimal coronary disease and normal filling pressures.  Low but not markedly low CI at 2.14.  Creatinine stable at 1.4.   He has some cough today but breathing is much improved overall.   General: NAD Neck: No JVD, no thyromegaly or thyroid nodule.  Lungs: Clear to auscultation bilaterally with normal respiratory effort. CV: Nondisplaced PMI.  Heart regular S1/S2, no S3/S4, no murmur.  1+ left ankle edema.   Abdomen: Soft,  nontender, no hepatosplenomegaly, no distention.  Skin: Intact without lesions or rashes.  Neurologic: Alert and oriented x 3.  Psych: Normal affect. Extremities: No clubbing or cyanosis.  HEENT: Normal.   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  He needs a cardiac MRI to look for prior myocarditis/infiltrative disease (I will order).   Volume status excellent, will continue current HF meds and start Entresto this afternoon.   He has a LLE DVT, will start Eliquis  this afternoon (has been on heparin  gtt).   Potentially home tomorrow.   Peder Bourdon 03/08/2024 9:15 AM

## 2024-03-08 NOTE — Telephone Encounter (Signed)
 Patient Product/process development scientist completed.    The patient is insured through [facility]. Patient has Medicare and is not eligible for a copay card, but may be able to apply for patient assistance or Medicare RX Payment Plan (Patient Must reach out to their plan, if eligible for payment plan), if available.    Ran test claim for Entresto 24-26 mg and the current 30 day co-pay is $0.00.  Ran test claim for Eliquis 5 mg and the current 30 day co-pay is $0.00.  Ran test claim for Farxiga 10 mg and the current 30 day co-pay is $0.00.  Ran test claim for Jardiance 10 mg and the current 30 day co-pay is $0.00.  This test claim was processed through [facility]- copay amounts may vary at other pharmacies due to pharmacy/plan contracts, or as the patient moves through the different stages of their insurance plan.     Roland Earl, CPHT Pharmacy Technician III Certified Patient Advocate [facility] Patient Advocate Team Direct Number: [phone]  Fax: [phone]

## 2024-03-08 NOTE — Progress Notes (Signed)
 Pt. Transported off floor to Cath lab.

## 2024-03-08 NOTE — Progress Notes (Signed)
 Progress Note    Devin [name]   ZOX:[id number]  DOB: 12/15/1960  DOA: 03/04/2024     3 PCP: Patient, No Pcp Per  Initial CC: SOB  Hospital Course: Devin [name] is a 63 y.o. male with medical history significant of COPD, hypertension, history of pulmonary embolism, HIV infection and scoliosis who presented with lower extremity edema and dyspnea.  Patient reported 4 weeks of worsening lower extremity edema, but more severe over the last 2 days. It has been associated with exertional dyspnea but not frank angina symptoms.  He had an episode of severe episode of paroxysmal nocturnal dyspnea.  [place]  He was then transported to the ER via EMS.   He sustained a fall also 2 days prior, hitting his right shoulder, since then, his pain has been constant.  Apparently he is not taking any medications and has not seen his primary care provider in a long time.    Reports using methamphetamines (crystal meth) for pain over last several weeks.   Assessment and Plan:  Acute systolic heart failure (HCC) - Patient with signs of volume overload on admission, notably dyspnea/SOB - Requiring 2.5 L of oxygen, satting 92% - BNP 1823, creatinine 1.43 - CXR noted with pleural effusions - Cardiology following, appreciate assistance - Echo obtained showing EF less than 20%, no RWMA, indeterminate diastolic filling - lasix , digoxin, spironolactone, Farxiga - Underwent heart cath on 03/08/2024 with minimal CAD, nonischemic cardiomyopathy - Now recommended for cardiac MRI, follow-up results  Troponin elevation  - Denies of any chest pain, no EKG changes -Troponin remained flat 64, 66 - suspected demand insetting of severely reduced EF and CHF exac   AKI  - normal baseline  - monitor while on diuresis  History of pulmonary embolism LLE DVT - Partially occlusive peripheral thrombus in the distal left femoral vein and popliteal vein. This could be  partially occlusive acute thrombus or recanalized chronic thrombus. - CTA chest negative for PE - Eliquis  on hold; has been on heparin  - Interaction with Eliquis  and Genvoya , so changed to Coumadin for now after cardiology and pharmacy discussion; if he misses INR appts etc then compliance is already questionable and would change back to Eliquis  at that time   Essential hypertension, benign -Monitoring BP -Holding ARB's  Human immunodeficiency virus (HIV) disease (HCC) - continue Genvoya     GERD   - Continue pantoprazole     Substance abuse with hallucination and paranoia - Patient has been using methamphetamines; negative UDS on admission; states has not used in over a week - PRN ativan     History of alcohol abuse, with no signs of acute withdrawal.  -Consulted psych for evaluation and recommendations  Right shoulder pain, continue oral analgesics     Interval History:  Seen after returning from cath.  Doing okay and understands plan for MRI.   Old records reviewed in assessment of this patient  Antimicrobials:   DVT prophylaxis:  SCDs Start: 03/04/24 1935   Code Status:   Code Status: Full Code  Mobility Assessment (Last 72 Hours)     Mobility Assessment     Row Name 03/08/24 1000 03/07/24 2045 03/07/24 0800 03/06/24 2046 03/06/24 0731   Does patient have an order for bedrest or is patient medically unstable No - Continue assessment No - Continue assessment No - Continue assessment No - Continue assessment No - Continue assessment   What is the highest level of mobility based on  the progressive mobility assessment? Level 5 (Walks with assist in room/hall) - Balance while stepping forward/back and can walk in room with assist - Complete Level 5 (Walks with assist in room/hall) - Balance while stepping forward/back and can walk in room with assist - Complete Level 5 (Walks with assist in room/hall) - Balance while stepping forward/back and can walk in room with assist -  Complete Level 6 (Walks independently in room and hall) - Balance while walking in room without assist - Complete Level 6 (Walks independently in room and hall) - Balance while walking in room without assist - Complete    Row Name 03/05/24 2100           Does patient have an order for bedrest or is patient medically unstable No - Continue assessment       What is the highest level of mobility based on the progressive mobility assessment? Level 6 (Walks independently in room and hall) - Balance while walking in room without assist - Complete                Barriers to discharge: none Disposition Plan:  Home HH orders placed: n/a Status is: Inpt  Objective: Blood pressure 128/83, pulse (!) 106, temperature (!) 97.5 F (36.4 C), temperature source Oral, resp. rate 16, height 6' (1.829 m), weight 80.7 kg, SpO2 91%.  Examination:  Physical Exam Constitutional:      General: He is not in acute distress.    Appearance: Normal appearance.  HENT:     Head: Normocephalic and atraumatic.     Mouth/Throat:     Mouth: Mucous membranes are moist.  Eyes:     Extraocular Movements: Extraocular movements intact.  Cardiovascular:     Rate and Rhythm: Normal rate and regular rhythm.  Pulmonary:     Effort: Pulmonary effort is normal. No respiratory distress.     Breath sounds: Normal breath sounds. No wheezing.  Abdominal:     General: Bowel sounds are normal. There is no distension.     Palpations: Abdomen is soft.     Tenderness: There is no abdominal tenderness.  Musculoskeletal:        General: Normal range of motion.     Cervical back: Normal range of motion and neck supple.     Right lower leg: Edema present.     Left lower leg: Edema present.  Skin:    General: Skin is warm and dry.  Neurological:     General: No focal deficit present.     Mental Status: He is alert.  Psychiatric:        Mood and Affect: Mood is anxious.      Consultants:  Cardiology  Procedures:     Data Reviewed: Results for orders placed or performed during the hospital encounter of 03/04/24 (from the past 24 hours)  Heparin  level (unfractionated)     Status: Abnormal   Collection Time: 03/07/24  6:06 PM  Result Value Ref Range   Heparin  Unfractionated 1.09 (H) 0.30 - 0.70 IU/mL  APTT     Status: Abnormal   Collection Time: 03/07/24  6:06 PM  Result Value Ref Range   aPTT 67 (H) 24 - 36 seconds  Basic metabolic panel with GFR     Status: Abnormal   Collection Time: 03/08/24  3:21 AM  Result Value Ref Range   Sodium 135 135 - 145 mmol/L   Potassium 3.8 3.5 - 5.1 mmol/L   Chloride 94 (L) 98 - 111  mmol/L   CO2 30 22 - 32 mmol/L   Glucose, Bld 112 (H) 70 - 99 mg/dL   BUN 17 6 - 20 mg/dL   Creatinine, Ser 4.09 (H) 0.61 - 1.24 mg/dL   Calcium  9.0 8.9 - 10.3 mg/dL   GFR, Estimated 56 (L) >60 mL/min   Anion gap 11 5 - 15  Brain natriuretic peptide     Status: Abnormal   Collection Time: 03/08/24  3:21 AM  Result Value Ref Range   B Natriuretic Peptide 483.3 (H) 0.0 - 100.0 pg/mL  Heparin  level (unfractionated)     Status: None   Collection Time: 03/08/24  3:21 AM  Result Value Ref Range   Heparin  Unfractionated 0.67 0.30 - 0.70 IU/mL  APTT     Status: Abnormal   Collection Time: 03/08/24  3:21 AM  Result Value Ref Range   aPTT 59 (H) 24 - 36 seconds  CBC     Status: None   Collection Time: 03/08/24  3:21 AM  Result Value Ref Range   WBC 6.1 4.0 - 10.5 K/uL   RBC 5.23 4.22 - 5.81 MIL/uL   Hemoglobin 15.2 13.0 - 17.0 g/dL   HCT 81.1 91.4 - 78.2 %   MCV 89.5 80.0 - 100.0 fL   MCH 29.1 26.0 - 34.0 pg   MCHC 32.5 30.0 - 36.0 g/dL   RDW 95.6 21.3 - 08.6 %   Platelets 171 150 - 400 K/uL   nRBC 0.0 0.0 - 0.2 %  Procalcitonin     Status: None   Collection Time: 03/08/24 10:26 AM  Result Value Ref Range   Procalcitonin <0.10 ng/mL  Cooxemetry Panel (carboxy, met, total hgb, O2 sat)     Status: Abnormal   Collection Time: 03/08/24 10:41 AM  Result Value Ref Range    Total hemoglobin 15.5 12.0 - 16.0 g/dL   O2 Saturation 57.8 %   Carboxyhemoglobin 2.0 (H) 0.5 - 1.5 %   Methemoglobin 0.8 0.0 - 1.5 %  Protime-INR     Status: None   Collection Time: 03/08/24  1:39 PM  Result Value Ref Range   Prothrombin Time 14.5 11.4 - 15.2 seconds   INR 1.1 0.8 - 1.2    I have reviewed pertinent nursing notes, vitals, labs, and images as necessary. I have ordered labwork to follow up on as indicated.  I have reviewed the last notes from staff over past 24 hours. I have discussed patient's care plan and test results with nursing staff, CM/SW, and other staff as appropriate.  Time spent: Greater than 50% of the 55 minute visit was spent in counseling/coordination of care for the patient as laid out in the A&P.   LOS: 3 days   Faith Homes, MD [facility] 03/08/2024, 1:57 PM

## 2024-03-08 NOTE — Progress Notes (Signed)
 PHARMACY - ANTICOAGULATION CONSULT NOTE  Pharmacy Consult for Eliquis > heparin   Indication: DVT  No Known Allergies  Patient Measurements: Height: 6' (182.9 cm) Weight: 80.7 kg (177 lb 14.6 oz) IBW/kg (Calculated) : 77.6 HEPARIN  DW (KG): 90.6  Vital Signs: Temp: 97.9 F (36.6 C) (05/12 0416) Temp Source: Oral (05/12 0416) BP: 103/62 (05/12 0416) Pulse Rate: 104 (05/12 0416)  Labs: Recent Labs    03/06/24 1018 03/06/24 1519 03/07/24 0600 03/07/24 0943 03/07/24 1806 03/08/24 0321  HGB 15.5  --  15.2  --   --  15.2  HCT 48.1  --  46.9  --   --  46.8  PLT 191  --  176  --   --  171  APTT  --   --   --  66* 67* 59*  HEPARINUNFRC  --   --   --  >1.10* 1.09* 0.67  CREATININE  --  1.50*  --  1.41*  --  1.44*    Estimated Creatinine Clearance: 59.9 mL/min (A) (by C-G formula based on SCr of 1.44 mg/dL (H)).   Medical History: Past Medical History:  Diagnosis Date   Anxiety    Arthritis    "I'm eat up w/it" (02/20/2017)   Asthma    Chronic back pain    "the whole back" (02/20/2017)   Chronic bronchitis (HCC)    Complication of anesthesia    "felt like I couldn't breath coming out of it"   COPD (chronic obstructive pulmonary disease) (HCC)    Depression    GERD (gastroesophageal reflux disease)    History of hiatal hernia    History of stomach ulcers    "bleeding ones; I was young then"   HIV infection (HCC) dx'd ~ 1999   Hypertension    Pneumonia    "several times" (02/20/2017)   Prolapsed disk 10/28   Pulmonary embolism (HCC) 02/20/2017   Scoliosis 08/24/13     Assessment: Patient with acute DVT- previously on Eliquis  for PE but stopped 11/2023. Patient is also on Genvoya - strong CYP3A4 inhibitor.  Apixaban  was started this admission on 5/8 (last dose was 5mg  at ~ 9am on 5/10) and now noted with HF with plans for PICC and cath. Pharmacy consulted to dose IV heparin .   Heparin  level still not correlating (showing therapeutic likely 2/t DOAC at 0.67) whereas  aPTT is low at 59, on heparin  infusion at 1400 units/hr. Hgb 15.2, plt 171. No s/sx of bleeding or infusion issues.   Goal of Therapy:  Heparin  level 0.3-0.7 aPTT 66-102 Monitor platelets by anticoagulation protocol: Yes   Plan:  Plan for cardiac cath this morning  Increase heparin  infusion to 1600 units/hr after cath >> will f/u timing  Order levels in 6 hours after restart  Daily heparin  level, aPTT and CBC  Thank you for allowing pharmacy to participate in this patient's care,  Nieves Bars, PharmD, BCCCP Clinical Pharmacist  Phone: [phone] 03/08/2024 7:36 AM  Please check AMION for all [facility] phone numbers After 10:00 PM, call Main Pharmacy [phone]  ADDENDUM  Underwent cardiac cath today finding non-ischemic CM, normal filling pressures, and marginal cardiac output. Discussed with MD - given drug interaction with apixaban  and Genvoya  in setting of new DVT will favor using warfarin over DOAC. Will start enoxaparin 80 mg every 12 hours approximately 2 hours after TR band removed (will be off 5/12~1530). INR today came back at 1.1 - will start warfarin 7.5 mg x1 tonight. Continue enoxaparin bridge until INR therapeutic.  Thank you for allowing pharmacy to participate in this patient's care,  Nieves Bars, PharmD, BCCCP Clinical Pharmacist

## 2024-03-08 NOTE — Plan of Care (Signed)

## 2024-03-09 ENCOUNTER — Inpatient Hospital Stay (HOSPITAL_COMMUNITY)

## 2024-03-09 ENCOUNTER — Other Ambulatory Visit (HOSPITAL_COMMUNITY): Payer: Self-pay

## 2024-03-09 LAB — COOXEMETRY PANEL
Carboxyhemoglobin: 1.7 % — ABNORMAL HIGH (ref 0.5–1.5)
Methemoglobin: <0.7 % (ref 0.0–1.5)
O2 Saturation: 60 %
Total hemoglobin: 16.8 g/dL — ABNORMAL HIGH (ref 12.0–16.0)

## 2024-03-09 LAB — BASIC METABOLIC PANEL WITH GFR
Anion gap: 9 (ref 5–15)
BUN: 16 mg/dL (ref 6–20)
CO2: 28 mmol/L (ref 22–32)
Calcium: 9.5 mg/dL (ref 8.9–10.3)
Chloride: 100 mmol/L (ref 98–111)
Creatinine, Ser: 1.37 mg/dL — ABNORMAL HIGH (ref 0.61–1.24)
GFR, Estimated: 59 mL/min — ABNORMAL LOW (ref >60)
Glucose, Bld: 134 mg/dL — ABNORMAL HIGH (ref 70–99)
Potassium: 4.4 mmol/L (ref 3.5–5.1)
Sodium: 137 mmol/L (ref 135–145)

## 2024-03-09 LAB — CBC
HCT: 50.5 % (ref 39.0–52.0)
Hemoglobin: 16.4 g/dL (ref 13.0–17.0)
MCH: 28.9 pg (ref 26.0–34.0)
MCHC: 32.5 g/dL (ref 30.0–36.0)
MCV: 88.9 fL (ref 80.0–100.0)
Platelets: 174 10*3/uL (ref 150–400)
RBC: 5.68 MIL/uL (ref 4.22–5.81)
RDW: 12.6 % (ref 11.5–15.5)
WBC: 5.5 10*3/uL (ref 4.0–10.5)
nRBC: 0 % (ref 0.0–0.2)

## 2024-03-09 LAB — LUPUS ANTICOAGULANT
DRVVT: 48.9 s — ABNORMAL HIGH (ref 0.0–47.0)
PTT Lupus Anticoagulant: 38.7 s (ref 0.0–43.5)
Thrombin Time: 122.6 s — ABNORMAL HIGH (ref 0.0–23.0)
dPT Confirm Ratio: 1.09 ratio (ref 0.00–1.34)
dPT: 47 s (ref 0.0–47.6)

## 2024-03-09 LAB — DRVVT MIX: dRVVT Mix: 44.7 s — ABNORMAL HIGH (ref 0.0–40.4)

## 2024-03-09 LAB — PROTIME-INR
INR: 1.1 (ref 0.8–1.2)
Prothrombin Time: 14.5 s (ref 11.4–15.2)

## 2024-03-09 LAB — DRVVT CONFIRM: dRVVT Confirm: 0.9 ratio (ref 0.8–1.2)

## 2024-03-09 LAB — TT MIX+TTN
THROMBIN NEUTRALIZATION: 21.9 s (ref 0.0–23.0)
THROMBIN TIME MIX: 37.7 s — ABNORMAL HIGH (ref 0.0–23.0)

## 2024-03-09 MED ORDER — WARFARIN SODIUM 7.5 MG PO TABS
7.5000 mg | ORAL_TABLET | Freq: Once | ORAL | Status: AC
  Administered 2024-03-09: 7.5 mg via ORAL
  Filled 2024-03-09: qty 1

## 2024-03-09 MED ORDER — GUAIFENESIN ER 600 MG PO TB12
1200.0000 mg | ORAL_TABLET | Freq: Two times a day (BID) | ORAL | Status: DC
  Administered 2024-03-09 – 2024-03-11 (×5): 1200 mg via ORAL
  Filled 2024-03-09 (×5): qty 2

## 2024-03-09 MED ORDER — CARVEDILOL 3.125 MG PO TABS
3.1250 mg | ORAL_TABLET | Freq: Two times a day (BID) | ORAL | Status: DC
  Administered 2024-03-09 – 2024-03-11 (×4): 3.125 mg via ORAL
  Filled 2024-03-09 (×5): qty 1

## 2024-03-09 MED ORDER — LORATADINE 10 MG PO TABS
10.0000 mg | ORAL_TABLET | Freq: Every day | ORAL | Status: DC
  Administered 2024-03-09 – 2024-03-11 (×3): 10 mg via ORAL
  Filled 2024-03-09 (×3): qty 1

## 2024-03-09 NOTE — Progress Notes (Signed)
 Progress Note    Rasheed O Idrovo   ZOX:[id number]  DOB: 1961/02/17  DOA: 03/04/2024     4 PCP: Patient, No Pcp Per  Initial CC: SOB  Hospital Course: Devin [name] is a 63 y.o. male with medical history significant of COPD, hypertension, history of pulmonary embolism, HIV infection and scoliosis who presented with lower extremity edema and dyspnea.  Patient reported 4 weeks of worsening lower extremity edema, but more severe over the last 2 days. It has been associated with exertional dyspnea but not frank angina symptoms.  He had an episode of severe episode of paroxysmal nocturnal dyspnea.  [place]  He was then transported to the ER via EMS.   He sustained a fall also 2 days prior, hitting his right shoulder, since then, his pain has been constant.  Apparently he is not taking any medications and has not seen his primary care provider in a long time.    Reports using methamphetamines (crystal meth) for pain over last several weeks.   Assessment and Plan:  Acute systolic heart failure (HCC) - Patient with signs of volume overload on admission, notably dyspnea/SOB - Requiring 2.5 L of oxygen, satting 92% - BNP 1823, creatinine 1.43 - CXR noted with pleural effusions - Cardiology following, appreciate assistance - Echo obtained showing EF less than 20%, no RWMA, indeterminate diastolic filling - lasix , digoxin, spironolactone, Farxiga - Underwent heart cath on 03/08/2024 with minimal CAD, nonischemic cardiomyopathy - Now recommended for cardiac MRI, follow-up results  Troponin elevation  - Denies of any chest pain, no EKG changes -Troponin remained flat 64, 66 - suspected demand insetting of severely reduced EF and CHF exac   AKI  - normal baseline  - monitor while on diuresis  History of pulmonary embolism LLE DVT - Partially occlusive peripheral thrombus in the distal left femoral vein and popliteal vein. This could be  partially occlusive acute thrombus or recanalized chronic thrombus. - CTA chest negative for PE - Eliquis  on hold; has been on heparin  - Interaction with Eliquis  and Genvoya , so changed to Coumadin for now after cardiology and pharmacy discussion; if he misses INR appts etc then compliance is already questionable and would change back to Eliquis  at that time.  Lovenox bridge also started  Essential hypertension, benign -Monitoring BP -Holding ARB's  Human immunodeficiency virus (HIV) disease (HCC) - continue Genvoya     GERD   - Continue pantoprazole     Substance abuse with hallucination and paranoia - Patient has been using methamphetamines; negative UDS on admission; states has not used in over a week - PRN ativan     History of alcohol abuse, with no signs of acute withdrawal.  -Consulted psych for evaluation and recommendations  Right shoulder pain, continue oral analgesics    Interval History:  No events overnight. On Lovenox bridge while warf started. Cardiac MRI today.    Old records reviewed in assessment of this patient  Antimicrobials:   DVT prophylaxis:  SCDs Start: 03/04/24 1935 warfarin (COUMADIN) tablet 7.5 mg   Code Status:   Code Status: Full Code  Mobility Assessment (Last 72 Hours)     Mobility Assessment     Row Name 03/09/24 0727 03/08/24 2100 03/08/24 1000 03/07/24 2045 03/07/24 0800   Does patient have an order for bedrest or is patient medically unstable No - Continue assessment No - Continue assessment No - Continue assessment No - Continue assessment No - Continue assessment   What  is the highest level of mobility based on the progressive mobility assessment? Level 4 (Walks with assist in room) - Balance while marching in place and cannot step forward and back - Complete Level 4 (Walks with assist in room) - Balance while marching in place and cannot step forward and back - Complete Level 5 (Walks with assist in room/hall) - Balance while  stepping forward/back and can walk in room with assist - Complete Level 5 (Walks with assist in room/hall) - Balance while stepping forward/back and can walk in room with assist - Complete Level 5 (Walks with assist in room/hall) - Balance while stepping forward/back and can walk in room with assist - Complete    Row Name 03/06/24 2046           Does patient have an order for bedrest or is patient medically unstable No - Continue assessment       What is the highest level of mobility based on the progressive mobility assessment? Level 6 (Walks independently in room and hall) - Balance while walking in room without assist - Complete                Barriers to discharge: none Disposition Plan:  Home HH orders placed: n/a Status is: Inpt  Objective: Blood pressure (!) 114/90, pulse 97, temperature 98.6 F (37 C), temperature source Oral, resp. rate 18, height 6' (1.829 m), weight 78.2 kg, SpO2 96%.  Examination:  Physical Exam Constitutional:      General: He is not in acute distress.    Appearance: Normal appearance.  HENT:     Head: Normocephalic and atraumatic.     Mouth/Throat:     Mouth: Mucous membranes are moist.  Eyes:     Extraocular Movements: Extraocular movements intact.  Cardiovascular:     Rate and Rhythm: Normal rate and regular rhythm.  Pulmonary:     Effort: Pulmonary effort is normal. No respiratory distress.     Breath sounds: Normal breath sounds. No wheezing.  Abdominal:     General: Bowel sounds are normal. There is no distension.     Palpations: Abdomen is soft.     Tenderness: There is no abdominal tenderness.  Musculoskeletal:        General: Normal range of motion.     Cervical back: Normal range of motion and neck supple.     Right lower leg: Edema present.     Left lower leg: Edema present.  Skin:    General: Skin is warm and dry.  Neurological:     General: No focal deficit present.     Mental Status: He is alert.  Psychiatric:         Mood and Affect: Mood normal.      Consultants:  Cardiology  Procedures:  03/08/2024: Right/left heart cath  Data Reviewed: Results for orders placed or performed during the hospital encounter of 03/04/24 (from the past 24 hours)  Cooxemetry Panel (carboxy, met, total hgb, O2 sat)     Status: Abnormal   Collection Time: 03/09/24  4:30 AM  Result Value Ref Range   Total hemoglobin 16.8 (H) 12.0 - 16.0 g/dL   O2 Saturation 60 %   Carboxyhemoglobin 1.7 (H) 0.5 - 1.5 %   Methemoglobin <0.7 0.0 - 1.5 %  Basic metabolic panel with GFR     Status: Abnormal   Collection Time: 03/09/24  4:31 AM  Result Value Ref Range   Sodium 137 135 - 145 mmol/L   Potassium  4.4 3.5 - 5.1 mmol/L   Chloride 100 98 - 111 mmol/L   CO2 28 22 - 32 mmol/L   Glucose, Bld 134 (H) 70 - 99 mg/dL   BUN 16 6 - 20 mg/dL   Creatinine, Ser 7.82 (H) 0.61 - 1.24 mg/dL   Calcium  9.5 8.9 - 10.3 mg/dL   GFR, Estimated 59 (L) >60 mL/min   Anion gap 9 5 - 15  CBC     Status: None   Collection Time: 03/09/24  4:31 AM  Result Value Ref Range   WBC 5.5 4.0 - 10.5 K/uL   RBC 5.68 4.22 - 5.81 MIL/uL   Hemoglobin 16.4 13.0 - 17.0 g/dL   HCT 95.6 21.3 - 08.6 %   MCV 88.9 80.0 - 100.0 fL   MCH 28.9 26.0 - 34.0 pg   MCHC 32.5 30.0 - 36.0 g/dL   RDW 57.8 46.9 - 62.9 %   Platelets 174 150 - 400 K/uL   nRBC 0.0 0.0 - 0.2 %  Protime-INR     Status: None   Collection Time: 03/09/24  4:31 AM  Result Value Ref Range   Prothrombin Time 14.5 11.4 - 15.2 seconds   INR 1.1 0.8 - 1.2    I have reviewed pertinent nursing notes, vitals, labs, and images as necessary. I have ordered labwork to follow up on as indicated.  I have reviewed the last notes from staff over past 24 hours. I have discussed patient's care plan and test results with nursing staff, CM/SW, and other staff as appropriate.  Time spent: Greater than 50% of the 55 minute visit was spent in counseling/coordination of care for the patient as laid out in the A&P.    LOS: 4 days   Faith Homes, MD [facility] 03/09/2024, 4:19 PM

## 2024-03-09 NOTE — Progress Notes (Signed)
 PHARMACY - ANTICOAGULATION CONSULT NOTE  Pharmacy Consult for Eliquis > heparin  > enox + warfarin Indication: DVT  No Known Allergies  Patient Measurements: Height: 6' (182.9 cm) Weight: 80.7 kg (177 lb 14.6 oz) IBW/kg (Calculated) : 77.6 HEPARIN  DW (KG): 90.6  Vital Signs: Temp: 98.5 F (36.9 C) (05/13 0420) Temp Source: Oral (05/13 0420) BP: 129/83 (05/13 0420) Pulse Rate: 103 (05/13 0440)  Labs: Recent Labs    03/07/24 0600 03/07/24 0943 03/07/24 1806 03/08/24 0321 03/08/24 0838 03/08/24 1339 03/09/24 0431  HGB 15.2  --   --  15.2 16.0  15.3  --  16.4  HCT 46.9  --   --  46.8 47.0  45.0  --  50.5  PLT 176  --   --  171  --   --  174  APTT  --  66* 67* 59*  --   --   --   LABPROT  --   --   --   --   --  14.5 14.5  INR  --   --   --   --   --  1.1 1.1  HEPARINUNFRC  --  >1.10* 1.09* 0.67  --   --   --   CREATININE  --  1.41*  --  1.44*  --   --  1.37*    Estimated Creatinine Clearance: 62.9 mL/min (A) (by C-G formula based on SCr of 1.37 mg/dL (H)).   Medical History: Past Medical History:  Diagnosis Date   Anxiety    Arthritis    "I'm eat up w/it" (02/20/2017)   Asthma    Chronic back pain    "the whole back" (02/20/2017)   Chronic bronchitis (HCC)    Complication of anesthesia    "felt like I couldn't breath coming out of it"   COPD (chronic obstructive pulmonary disease) (HCC)    Depression    GERD (gastroesophageal reflux disease)    History of hiatal hernia    History of stomach ulcers    "bleeding ones; I was young then"   HIV infection (HCC) dx'd ~ 1999   Hypertension    Pneumonia    "several times" (02/20/2017)   Prolapsed disk 10/28   Pulmonary embolism (HCC) 02/20/2017   Scoliosis 08/24/13     Assessment: Patient with acute DVT- previously on Eliquis  for PE but stopped 11/2023. Patient is also on Genvoya - strong CYP3A4 inhibitor.  Apixaban  was started this admission on 5/8 (last dose was 5mg  at ~ 9am on 5/10) and now noted with HF with  plans for PICC and cath. Pharmacy consulted to dose IV heparin .   Underwent cardiac cath 5/12 finding non-ischemic CM, normal filling pressures, and marginal cardiac output. Discussed with MD - given drug interaction with apixaban  and Genvoya  in setting of new DVT will favor using warfarin over DOAC.   Continue enoxaparin 80 mg every 12 hours bridge while INR subtherapeutic. Received first dose of warfarin on 5/12 - INR today remains 1.1. Renal function stable - CrCl >60 mL/min.   Goal of Therapy:  INR 2-3 Monitor platelets by anticoagulation protocol: Yes   Plan:  Continue enoxaparin 80 mg IV q12 hours until INR is therapeutic  Order warfarin 7.5 mg tonight  Monitor daily INR, CBC, and for s/sx of bleeding   Thank you for allowing pharmacy to participate in this patient's care,  Nieves Bars, PharmD, BCCCP Clinical Pharmacist  Phone: [phone] 03/09/2024 7:24 AM  Please check AMION for all [facility] phone numbers  After 10:00 PM, call Main Pharmacy [phone]

## 2024-03-09 NOTE — Care Management Important Message (Signed)
 Important Message  Patient Details  Name: Devin [name] MRN: [id number] Date of Birth: 07-16-1961   Important Message Given:  Yes - Medicare IM     Janith Melnick 03/09/2024, 9:25 AM

## 2024-03-09 NOTE — Progress Notes (Signed)
 Patient ID: Devin [name], male   DOB: 1961-08-19, 63 y.o.   MRN: [id number]     Advanced Heart Failure Rounding Note  Cardiologist: None  Chief Complaint: CHF  Subjective:    Patient reports diaphoresis this morning, no dyspnea.  Has not walked yet.   CVP < 5, co-ox 60%.   LHC/RHC showed minimal coronary disease and normal filling pressures.  Low but not markedly low CI at 2.14.    Objective:   Weight Range: 80.7 kg Body mass index is 24.13 kg/m.   Vital Signs:   Temp:  [97.5 F (36.4 C)-98.5 F (36.9 C)] 98.5 F (36.9 C) (05/13 0420) Pulse Rate:  [0-106] 103 (05/13 0440) Resp:  [10-26] 18 (05/13 0420) BP: (109-140)/(68-93) 129/83 (05/13 0420) SpO2:  [90 %-100 %] 94 % (05/13 0440) Last BM Date : 03/05/24  Weight change: Filed Weights   03/06/24 0554 03/07/24 0341 03/08/24 0416  Weight: 90.6 kg 79.2 kg 80.7 kg    Intake/Output:   Intake/Output Summary (Last 24 hours) at 03/09/2024 0722 Last data filed at 03/08/2024 1636 Gross per 24 hour  Intake 120 ml  Output 1100 ml  Net -980 ml      Physical Exam    General: NAD Neck: No JVD, no thyromegaly or thyroid nodule.  Lungs: Clear to auscultation bilaterally with normal respiratory effort. CV: Nondisplaced PMI.  Heart regular S1/S2, no S3/S4, no murmur.  No peripheral edema.   Abdomen: Soft, nontender, no hepatosplenomegaly, no distention.  Skin: Intact without lesions or rashes.  Neurologic: Alert and oriented x 3.  Psych: Normal affect. Extremities: No clubbing or cyanosis.  HEENT: Normal.   Telemetry   NSR 100s (personally reviewed)  Labs    CBC Recent Labs    03/06/24 1018 03/07/24 0600 03/08/24 0321 03/08/24 0838 03/09/24 0431  WBC 6.0   < > 6.1  --  5.5  NEUTROABS 3.7  --   --   --   --   HGB 15.5   < > 15.2 16.0  15.3 16.4  HCT 48.1   < > 46.8 47.0  45.0 50.5  MCV 89.9   < > 89.5  --  88.9  PLT 191   < > 171  --  174   < > = values in this interval not displayed.   Basic Metabolic  Panel Recent Labs    03/06/24 1018 03/06/24 1519 03/08/24 0321 03/08/24 0838 03/09/24 0431  NA  --    < > 135 136  136 137  K  --    < > 3.8 4.1  4.0 4.4  CL  --    < > 94*  --  100  CO2  --    < > 30  --  28  GLUCOSE  --    < > 112*  --  134*  BUN  --    < > 17  --  16  CREATININE  --    < > 1.44*  --  1.37*  CALCIUM   --    < > 9.0  --  9.5  MG 1.7  --   --   --   --    < > = values in this interval not displayed.   Liver Function Tests No results for input(s): "AST", "ALT", "ALKPHOS", "BILITOT", "PROT", "ALBUMIN" in the last 72 hours.  No results for input(s): "LIPASE", "AMYLASE" in the last 72 hours. Cardiac Enzymes No results for input(s): "CKTOTAL", "CKMB", "CKMBINDEX", "TROPONINI" in  the last 72 hours.  BNP: BNP (last 3 results) Recent Labs    03/06/24 0312 03/07/24 0600 03/08/24 0321  BNP 2,092.4* 680.5* 483.3*    ProBNP (last 3 results) No results for input(s): "PROBNP" in the last 8760 hours.   D-Dimer No results for input(s): "DDIMER" in the last 72 hours. Hemoglobin A1C No results for input(s): "HGBA1C" in the last 72 hours. Fasting Lipid Panel No results for input(s): "CHOL", "HDL", "LDLCALC", "TRIG", "CHOLHDL", "LDLDIRECT" in the last 72 hours. Thyroid Function Tests No results for input(s): "TSH", "T4TOTAL", "T3FREE", "THYROIDAB" in the last 72 hours.  Invalid input(s): "FREET3"   Other results:   Imaging    CARDIAC CATHETERIZATION Result Date: 03/08/2024 1. Minimal coronary disease, nonischemic cardiomyopathy. 2. Normal filling pressures. 3. Low but not markedly low cardiac output (CI 2.14)      Medications:     Scheduled Medications:  atorvastatin   10 mg Oral Daily   carvedilol  3.125 mg Oral BID WC   Chlorhexidine Gluconate Cloth  6 each Topical Daily   dapagliflozin propanediol  10 mg Oral Daily   digoxin  0.125 mg Oral Daily   DULoxetine   60 mg Oral Daily   elvitegravir-cobicistat-emtricitabine -tenofovir   1 tablet Oral Q  breakfast   enoxaparin (LOVENOX) injection  80 mg Subcutaneous Q12H   gabapentin   200 mg Oral TID   OLANZapine   10 mg Oral QHS   pantoprazole   40 mg Oral Daily   sacubitril-valsartan  1 tablet Oral BID   sodium chloride  flush  10-40 mL Intracatheter Q12H   sodium chloride  flush  3 mL Intravenous Q12H   spironolactone  25 mg Oral Daily   Warfarin - Pharmacist Dosing Inpatient   Does not apply q1600    Infusions:  sodium chloride       PRN Medications: sodium chloride , acetaminophen , albuterol , cyclobenzaprine , hydrOXYzine , LORazepam , ondansetron  (ZOFRAN ) IV, oxyCODONE , sodium chloride  flush, sodium chloride  flush   Assessment/Plan   1. Acute systolic CHF: Patient presented with CHF, symptoms severely for about a week.  Echo showed EF < 20%, severe RV dysfunction, mild RV enlargement.  No prior history of CHF.  HS-TnI mildly elevated with no trend, suspect demand ischemia from volume overload.  No strong family history of CAD or cardiomyopathy.  Of note, he has regularly used methamphetamine for about a year though UDS was negative this admission. Cath with no significant CAD.  Cardiac index was low but not markedly low (CI 2.14).  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 He is not volume overloaded on exam, CVP < 5.  Co-ox 60%.  - Cardiac MRI today to assess for infiltrative disease.  - Carefully start Coreg 3.125 mg bid.  - Continue digoxin 0.125 daily.  - Continue spironolactone 25 mg daily.  - Continue Farxiga 10 mg daily.   - Continue Entresto 24/26 bid.  - Not advanced therapies candidate at this time with active methamphetamine abuse.  2. PE/DVT: Patient has history of prior PE.  CTA chest this admission was negative but venous dopplers showed LLE DVT.  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  - Factor V Leiden, prothrombin gene mutation, lupus anticoagulant all pending.  - Will use Lovenox bridge to warfarin for anticoagulation given interaction between DOACs and Genvoya .   3. HIV: Continue home anti-retrovirals.  4. Smoking/COPD: Counseled cessation.  5. Methamphetamine: Counseled cessation.   Mobilize, home soon.   Peder Bourdon 03/09/2024 7:22 AM

## 2024-03-09 NOTE — Progress Notes (Signed)
 Mobility Specialist: Progress Note   03/09/24 1620  Mobility  Activity Ambulated with assistance in hallway  Level of Assistance Standby assist, set-up cues, supervision of patient - no hands on  Assistive Device None  Distance Ambulated (ft) 700 ft  Activity Response Tolerated well  Mobility Referral Yes  Mobility visit 1 Mobility  Mobility Specialist Start Time (ACUTE ONLY) 1445  Mobility Specialist Stop Time (ACUTE ONLY) 1505  Mobility Specialist Time Calculation (min) (ACUTE ONLY) 20 min    Pt was agreeable to mobility session - received in bed. Sv throughout. C/o his head sweating but the rest of his body being cold. Stated he felt a little SOB but nothing concerning or notable. Ambulated down the hallway and back to his room door twice. Returned to room without fault. Left on EOB with all needs met, call bell in reach.   Deloria Fetch Mobility Specialist Please contact via SecureChat or Rehab office at [phone]

## 2024-03-09 NOTE — Progress Notes (Incomplete)
 Patient ID: Devin [name], male   DOB: 09/16/1961, 63 y.o.   MRN: [id number]     Advanced Heart Failure Rounding Note  Cardiologist: None  Chief Complaint: CHF  Subjective:    Breathing improving but now complaining of congestion and productive cough w/ green colored sputum.   [name] much improved. CVP 5. Co-ox not collected.   1.8L in UOP yesterday. Overall net negative 6L.  Scr 1.4 K 3.8    Objective:   Weight Range: 80.7 kg Body mass index is 24.13 kg/m.   Vital Signs:   Temp:  [97.5 F (36.4 C)-98.5 F (36.9 C)] 98.5 F (36.9 C) (05/13 0420) Pulse Rate:  [0-106] 103 (05/13 0440) Resp:  [10-26] 18 (05/13 0420) BP: (109-140)/(68-93) 129/83 (05/13 0420) SpO2:  [90 %-100 %] 94 % (05/13 0440) Last BM Date : 03/05/24  Weight change: Filed Weights   03/06/24 0554 03/07/24 0341 03/08/24 0416  Weight: 90.6 kg 79.2 kg 80.7 kg    Intake/Output:   Intake/Output Summary (Last 24 hours) at 03/09/2024 0709 Last data filed at 03/08/2024 1636 Gross per 24 hour  Intake 120 ml  Output 1100 ml  Net -980 ml      Physical Exam    General:  disheveled looking. No distress  HEENT: Normal Neck: Supple. JVD 7-8 cm.  Cor: PMI lateral. Regular rate & rhythm. No rubs, gallops or murmurs. Lungs: Clear Abdomen: Soft, nontender, nondistended.  Extremities: No cyanosis, clubbing, rash, trace b/l LE edema + RUE PICC Neuro: Alert & orientedx3, cranial nerves grossly intact. moves all 4 extremities w/o difficulty. Affect flat    Telemetry   Sinus tach 110s (personally reviewed)  Labs    CBC Recent Labs    03/06/24 1018 03/07/24 0600 03/08/24 0321 03/08/24 0838 03/09/24 0431  WBC 6.0   < > 6.1  --  5.5  NEUTROABS 3.7  --   --   --   --   HGB 15.5   < > 15.2 16.0  15.3 16.4  HCT 48.1   < > 46.8 47.0  45.0 50.5  MCV 89.9   < > 89.5  --  88.9  PLT 191   < > 171  --  174   < > = values in this interval not displayed.   Basic Metabolic Panel Recent Labs    08/65/78 1018  03/06/24 1519 03/08/24 0321 03/08/24 0838 03/09/24 0431  NA  --    < > 135 136  136 137  K  --    < > 3.8 4.1  4.0 4.4  CL  --    < > 94*  --  100  CO2  --    < > 30  --  28  GLUCOSE  --    < > 112*  --  134*  BUN  --    < > 17  --  16  CREATININE  --    < > 1.44*  --  1.37*  CALCIUM   --    < > 9.0  --  9.5  MG 1.7  --   --   --   --    < > = values in this interval not displayed.   Liver Function Tests No results for input(s): "AST", "ALT", "ALKPHOS", "BILITOT", "PROT", "ALBUMIN" in the last 72 hours.  No results for input(s): "LIPASE", "AMYLASE" in the last 72 hours. Cardiac Enzymes No results for input(s): "CKTOTAL", "CKMB", "CKMBINDEX", "TROPONINI" in the last 72 hours.  BNP:  BNP (last 3 results) Recent Labs    03/06/24 0312 03/07/24 0600 03/08/24 0321  BNP 2,092.4* 680.5* 483.3*    ProBNP (last 3 results) No results for input(s): "PROBNP" in the last 8760 hours.   D-Dimer No results for input(s): "DDIMER" in the last 72 hours. Hemoglobin A1C No results for input(s): "HGBA1C" in the last 72 hours. Fasting Lipid Panel No results for input(s): "CHOL", "HDL", "LDLCALC", "TRIG", "CHOLHDL", "LDLDIRECT" in the last 72 hours. Thyroid Function Tests No results for input(s): "TSH", "T4TOTAL", "T3FREE", "THYROIDAB" in the last 72 hours.  Invalid input(s): "FREET3"   Other results:   Imaging    CARDIAC CATHETERIZATION Result Date: 03/08/2024 1. Minimal coronary disease, nonischemic cardiomyopathy. 2. Normal filling pressures. 3. Low but not markedly low cardiac output (CI 2.14)      Medications:     Scheduled Medications:  atorvastatin   10 mg Oral Daily   Chlorhexidine Gluconate Cloth  6 each Topical Daily   dapagliflozin propanediol  10 mg Oral Daily   digoxin  0.125 mg Oral Daily   DULoxetine   60 mg Oral Daily   elvitegravir-cobicistat-emtricitabine -tenofovir   1 tablet Oral Q breakfast   enoxaparin (LOVENOX) injection  80 mg Subcutaneous Q12H    gabapentin   200 mg Oral TID   OLANZapine   10 mg Oral QHS   pantoprazole   40 mg Oral Daily   sacubitril-valsartan  1 tablet Oral BID   sodium chloride  flush  10-40 mL Intracatheter Q12H   sodium chloride  flush  3 mL Intravenous Q12H   spironolactone  25 mg Oral Daily   Warfarin - Pharmacist Dosing Inpatient   Does not apply q1600    Infusions:  sodium chloride       PRN Medications: sodium chloride , acetaminophen , albuterol , cyclobenzaprine , hydrOXYzine , LORazepam , ondansetron  (ZOFRAN ) IV, oxyCODONE , sodium chloride  flush, sodium chloride  flush   Assessment/Plan   1. Acute systolic CHF: Patient presented with CHF, symptoms severely for about a week.  Echo showed EF < 20%, severe RV dysfunction, mild RV enlargement.  No prior history of CHF.  HS-TnI mildly elevated with no trend, suspect demand ischemia from volume overload.  He has had atypical chest pain and he is a smoker, has anteroseptal Qs. No strong family history of CAD or cardiomyopathy.  Of note, he has regularly used methamphetamine for about a year though UDS was negative this admission.  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 Volume status improved w/ diuresis. CVP 5. Co-oxes have been stable.  - Plan R/LHC today to rule out coronary disease as cause of cardiomyopathy and for formal evaluation of hemodynamics  - Continue digoxin 0.125 daily.  - Continue spironolactone 25 mg daily.  - Continue Farxiga 10 mg daily.   - If no significant CAD on cath, he will need cardiac MRI.  - Not advanced therapies candidate at this time with active methamphetamine abuse.  2. PE/DVT: Patient has history of prior PE.  CTA chest this admission was negative but venous dopplers showed LLE DVT.  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  - Factor V Leiden, prothrombin gene mutation, lupus anticoagulant all pending.  - Will use heparin  gtt for anticoagulation for now until completion of procedures  3. HIV: Continue  home anti-retrovirals.  4. Smoking/COPD: Counseled cessation.  5. Methamphetamine: Counseled cessation.  6. Productive Cough/Congestion: pt reports green colored sputum. WBC ok and Afebrile.  - check PCT    Length of Stay: [address] PA-C  03/09/2024, 7:09 AM  Advanced Heart Failure Team Pager [phone] (  M-F; 7a - 5p)  Please contact [facility] for night-coverage after hours (5p -7a ) and weekends on [email or web address]   Patient seen with PA, I formulated the plan and agree with the above note.   LHC/RHC today showed minimal coronary disease and normal filling pressures.  Low but not markedly low CI at 2.14.  Creatinine stable at 1.4.   He has some cough today but breathing is much improved overall.   General: NAD Neck: No JVD, no thyromegaly or thyroid nodule.  Lungs: Clear to auscultation bilaterally with normal respiratory effort. CV: Nondisplaced PMI.  Heart regular S1/S2, no S3/S4, no murmur.  1+ left ankle edema.   Abdomen: Soft, nontender, no hepatosplenomegaly, no distention.  Skin: Intact without lesions or rashes.  Neurologic: Alert and oriented x 3.  Psych: Normal affect. Extremities: No clubbing or cyanosis.  HEENT: Normal.   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  He needs a cardiac MRI to look for prior myocarditis/infiltrative disease (I will order).   Volume status excellent, will continue current HF meds and start Entresto this afternoon.   He has a LLE DVT, will start Eliquis  this afternoon (has been on heparin  gtt).   Potentially home tomorrow.   Devin [name] 03/09/2024 7:09 AM

## 2024-03-09 NOTE — Plan of Care (Signed)
 ?  Problem: Clinical Measurements: ?Goal: Will remain free from infection ?Outcome: Progressing ?  ?

## 2024-03-10 LAB — CBC
HCT: 51 % (ref 39.0–52.0)
Hemoglobin: 16.7 g/dL (ref 13.0–17.0)
MCH: 28.4 pg (ref 26.0–34.0)
MCHC: 32.7 g/dL (ref 30.0–36.0)
MCV: 86.9 fL (ref 80.0–100.0)
Platelets: 168 10*3/uL (ref 150–400)
RBC: 5.87 MIL/uL — ABNORMAL HIGH (ref 4.22–5.81)
RDW: 12.8 % (ref 11.5–15.5)
WBC: 5.2 10*3/uL (ref 4.0–10.5)
nRBC: 0 % (ref 0.0–0.2)

## 2024-03-10 LAB — BASIC METABOLIC PANEL WITH GFR
Anion gap: 12 (ref 5–15)
BUN: 20 mg/dL (ref 6–20)
CO2: 26 mmol/L (ref 22–32)
Calcium: 9.7 mg/dL (ref 8.9–10.3)
Chloride: 99 mmol/L (ref 98–111)
Creatinine, Ser: 1.11 mg/dL (ref 0.61–1.24)
GFR, Estimated: >60 mL/min (ref >60)
Glucose, Bld: 135 mg/dL — ABNORMAL HIGH (ref 70–99)
Potassium: 4.1 mmol/L (ref 3.5–5.1)
Sodium: 137 mmol/L (ref 135–145)

## 2024-03-10 LAB — COOXEMETRY PANEL
Carboxyhemoglobin: 2 % — ABNORMAL HIGH (ref 0.5–1.5)
Methemoglobin: <0.7 % (ref 0.0–1.5)
O2 Saturation: 72 %
Total hemoglobin: 17.3 g/dL — ABNORMAL HIGH (ref 12.0–16.0)

## 2024-03-10 LAB — PROTIME-INR
INR: 1.4 — ABNORMAL HIGH (ref 0.8–1.2)
Prothrombin Time: 17.3 s — ABNORMAL HIGH (ref 11.4–15.2)

## 2024-03-10 LAB — LIPOPROTEIN A (LPA): Lipoprotein (a): 116.3 nmol/L — ABNORMAL HIGH (ref <75.0)

## 2024-03-10 MED ORDER — MIDODRINE HCL 5 MG PO TABS
2.5000 mg | ORAL_TABLET | Freq: Three times a day (TID) | ORAL | Status: DC
  Administered 2024-03-11: 2.5 mg via ORAL
  Filled 2024-03-10: qty 1

## 2024-03-10 MED ORDER — ORAL CARE MOUTH RINSE: 15.0000 mL | OROMUCOSAL | Status: DC | PRN

## 2024-03-10 MED ORDER — SODIUM CHLORIDE 0.9 % IV BOLUS
500.0000 mL | Freq: Once | INTRAVENOUS | Status: AC
  Administered 2024-03-10: 500 mL via INTRAVENOUS

## 2024-03-10 MED ORDER — BICTEGRAVIR-EMTRICITAB-TENOFOV 50-200-25 MG PO TABS
1.0000 | ORAL_TABLET | Freq: Every day | ORAL | Status: DC
  Administered 2024-03-11: 1 via ORAL
  Filled 2024-03-10: qty 1

## 2024-03-10 MED ORDER — WARFARIN SODIUM 5 MG PO TABS: 5.0000 mg | ORAL_TABLET | Freq: Once | ORAL | Status: DC

## 2024-03-10 MED ORDER — APIXABAN 5 MG PO TABS
5.0000 mg | ORAL_TABLET | Freq: Two times a day (BID) | ORAL | Status: DC
  Administered 2024-03-11: 5 mg via ORAL
  Filled 2024-03-10: qty 1

## 2024-03-10 MED ORDER — APIXABAN 2.5 MG PO TABS
2.5000 mg | ORAL_TABLET | Freq: Two times a day (BID) | ORAL | Status: AC
  Administered 2024-03-10: 2.5 mg via ORAL
  Filled 2024-03-10: qty 1

## 2024-03-10 MED ORDER — SPIRONOLACTONE 12.5 MG HALF TABLET
12.5000 mg | ORAL_TABLET | Freq: Every day | ORAL | Status: DC
  Administered 2024-03-11: 12.5 mg via ORAL
  Filled 2024-03-10: qty 1

## 2024-03-10 NOTE — TOC Progression Note (Signed)
 Transition of Care [facility]) - Progression Note    Patient Details  Name: Devin [name] MRN: [id number] Date of Birth: 1961-03-26  [facility] CM/SW Contact  Jennett Model, RN Phone Number: 03/10/2024, 11:40 AM  Clinical Narrative:    NCM spoke with patient, he states he lives in [place] and [place] is closer to him then [place], [place] is trying to get him with a PCP in [place] at [facility],  they will have to call this NCM back to see if they can work him in, he will be on coumadin and lovenox bridge.  This NCM contacted Fredrik Jensen CSW with HF team to see if she can assist him with getting a phone.  She states she will work on getting him a phone.     Expected Discharge Plan: Home/Self Care Barriers to Discharge: Continued Medical Work up  Expected Discharge Plan and Services In-house Referral: Clinical Social Work     Living arrangements for the past 2 months: Single Family Home                                       Social Determinants of Health (SDOH) Interventions SDOH Screenings   Food Insecurity: No Food Insecurity (03/04/2024)  Housing: Low Risk  (03/04/2024)  Transportation Needs: No Transportation Needs (03/04/2024)  Utilities: Not At Risk (03/04/2024)  Alcohol Screen: Low Risk  (06/17/2023)  Depression (PHQ2-9): Low Risk  (04/04/2022)  Social Connections: Unknown (03/04/2024)  Tobacco Use: High Risk (03/04/2024)    Readmission Risk Interventions    03/05/2024   10:39 AM  Readmission Risk Prevention Plan  Transportation Screening Complete  HRI or Home Care Consult Complete  Social Work Consult for Recovery Care Planning/Counseling Complete  Palliative Care Screening Not Applicable  Medication Review Oceanographer) Complete

## 2024-03-10 NOTE — Progress Notes (Addendum)
 PHARMACY - ANTICOAGULATION CONSULT NOTE  Pharmacy Consult for Eliquis > heparin  > enox + warfarin Indication: DVT  No Known Allergies  Patient Measurements: Height: 6' (182.9 cm) Weight: 78.2 kg (172 lb 6.4 oz) IBW/kg (Calculated) : 77.6 HEPARIN  DW (KG): 90.6  Vital Signs: Temp: 98 F (36.7 C) (05/14 0020) Temp Source: Oral (05/14 0020) BP: 91/75 (05/14 0020) Pulse Rate: 97 (05/14 0020)  Labs: Recent Labs    03/07/24 0943 03/07/24 0943 03/07/24 1806 03/08/24 0321 03/08/24 0838 03/08/24 1339 03/09/24 0431 03/10/24 0500  HGB  --    < >  --  15.2 16.0  15.3  --  16.4 16.7  HCT  --    < >  --  46.8 47.0  45.0  --  50.5 51.0  PLT  --   --   --  171  --   --  174 168  APTT 66*  --  67* 59*  --   --   --   --   LABPROT  --   --   --   --   --  14.5 14.5 17.3*  INR  --   --   --   --   --  1.1 1.1 1.4*  HEPARINUNFRC >1.10*  --  1.09* 0.67  --   --   --   --   CREATININE 1.41*  --   --  1.44*  --   --  1.37* 1.11   < > = values in this interval not displayed.    Estimated Creatinine Clearance: 77.7 mL/min (by C-G formula based on SCr of 1.11 mg/dL).   Medical History: Past Medical History:  Diagnosis Date   Anxiety    Arthritis    "I'm eat up w/it" (02/20/2017)   Asthma    Chronic back pain    "the whole back" (02/20/2017)   Chronic bronchitis (HCC)    Complication of anesthesia    "felt like I couldn't breath coming out of it"   COPD (chronic obstructive pulmonary disease) (HCC)    Depression    GERD (gastroesophageal reflux disease)    History of hiatal hernia    History of stomach ulcers    "bleeding ones; I was young then"   HIV infection (HCC) dx'd ~ 1999   Hypertension    Pneumonia    "several times" (02/20/2017)   Prolapsed disk 10/28   Pulmonary embolism (HCC) 02/20/2017   Scoliosis 08/24/13     Assessment: Patient with acute DVT- previously on Eliquis  for PE but stopped 11/2023. Patient is also on Genvoya - strong CYP3A4 inhibitor.  Apixaban  was  started this admission on 5/8 (last dose was 5mg  at ~ 9am on 5/10) and now noted with HF with plans for PICC and cath. Pharmacy consulted to dose IV heparin .   Underwent cardiac cath 5/12 finding non-ischemic CM, normal filling pressures, and marginal cardiac output. Discussed with MD - given drug interaction with apixaban  and Genvoya  in setting of new DVT will favor using warfarin over DOAC.   Continue enoxaparin 80 mg every 12 hours bridge while INR subtherapeutic. Has now received 2 doses of warfarin (starting to see full effect of warfarin)- INR today increased to 1.4. Renal function stable - CrCl >60 mL/min.   Goal of Therapy:  INR 2-3 Monitor platelets by anticoagulation protocol: Yes   Plan:  Continue enoxaparin 80 mg IV q12 hours until INR is therapeutic  Order warfarin 5 mg tonight  Monitor daily INR, CBC, and for s/sx  of bleeding   ADDENDUM Discussed with hospitalist and ID team, will change patient from genvoya  to [facility] in addition to getting HIV lab work. Will give one dose apixaban  2.5 mg tonight (time from last enoxaparin dose) then transition to 5 mg BID starting tomorrow when change to [employer]. Monitor for s/sx of bleeding and CBC.   Thank you for allowing pharmacy to participate in this patient's care,  Nieves Bars, PharmD, BCCCP Clinical Pharmacist  Phone: [phone] 03/10/2024 7:27 AM  Please check AMION for all [facility] Pharmacy phone numbers After 10:00 PM, call Main Pharmacy [phone]

## 2024-03-10 NOTE — Progress Notes (Signed)
 [facility] Liaison Note  03/10/2024  Devin [name] January 26, 1961 [id number]  Location: RN Hospital Liaison screened the patient remotely at [facility].  Insurance: [employer]   Devin [name] is a 63 y.o. male who is current a pt with Zulma Hitt, MD with [facility]. The patient was screened for readmission hospitalization with noted high risk score for unplanned readmission risk with 1 IP in 6 months.  Chart screened and review of patient's electronic medical record reveals patient was admitted for Shortness of Breath. TOC documentation indicated team is working on trying to establish a PCP with [facility] for follow up and the HF team has been requested to assist with getting pt a phone. Currently no VBCI interventions at this time.   VBCI Care Management/Population Health does not replace or interfere with any arrangements made by the Inpatient Transition of Care team.   For questions contact:   Lilla Reichert, RN, BSN Hospital Liaison [place]   [facility], Population Health Office Hours MTWF  8:00 am-6:00 pm Direct Dial: [phone] mobile [email or web address]

## 2024-03-10 NOTE — Telephone Encounter (Signed)
 Appt sch- Name: Deeric, Knauf MRN: [id number]  Date: 04/21/2024 Status: Sch  Time: 1:30 PM Length: 15    Copay: $0.00  Provider: Sandie Cross, MD      Copied from CRM [id number]. Topic: Appointments - Scheduling Inquiry for Clinic >> Mar 09, 2024  4:37 PM Adrianna P wrote: Reason for CRM: Patient wants schedule appt with Dr. Alwin Baars, no appointments would populate please call patient. Please call [phone]

## 2024-03-10 NOTE — Progress Notes (Signed)
 Progress Note    Haydin O Littlejohn   WJX:[id number]  DOB: [birth year]  DOA: 03/04/2024     5 PCP: Patient, No Pcp Per  Initial CC: SOB  Brief Hospital admission narrative: As per H&P written by Dr. Sunnie England on 03/04/2024 Ramir O Sanroman is a 63 y.o. male with medical history significant of COPD, hypertension, history of pulmonary embolism, HIV infection and scoliosis who presented with lower extremity edema and dyspnea.  Patient reported 4 weeks of worsening lower extremity edema, but more severe over the last 2 days. It has been associated with exertional dyspnea but not frank angina symptoms.  Last night he had a episode of severe episode of paroxysmal nocturnal dyspnea. This morning while in the social security office he was noted in distress, prompting the personal to call EMS.  Patient was found tachycardic and in distress, then decision was made to transport him to the ED.    He sustained a fall 2 days ago, hitting his right shoulder, since his pain has been constant.  Apparently he is not taking any medications and has not seen his primary care provider in a long time.    Reports using methamphetamines for pain over last several weeks.   At the time of my examination his dyspnea is moderate in intensity and is worse with movement, no improving factors.    Interval History:  Afebrile, no chest pain and no requiring oxygen supplementation - After discussing with infectious disease service HIV medication has been transitioned to [facility] and patient will be started on Eliquis . - Holding on Lovenox and further Coumadin bridge at the moment. - Follow any further recommendations by cardiology service. - Unable to tolerate cardiac MRI.  (Claustrophobic)  Assessment and plan: Acute systolic heart failure (HCC) - Patient with signs of volume overload on admission, notably dyspnea/SOB - Requiring 2.5 L of oxygen, satting 92% - BNP 1823, creatinine 1.43 - CXR noted with pleural effusions -  Cardiology following, appreciate assistance - Echo obtained showing EF less than 20%, no RWMA, indeterminate diastolic filling - Underwent heart cath on 03/08/2024 with minimal CAD, nonischemic cardiomyopathy. - Unable to perform cardiac MRI due to claustrophobia. -Follow cardiology service for GDMT's management   Troponin elevation  - Denies of any chest pain, no EKG changes -Troponin remained flat 64, 66 - suspected demand insetting of severely reduced EF and CHF exac. - Patient denies chest pain.   AKI  -Improving/normalized - Creatinine 1.11 currently - Continue minimizing nephrotoxic agents - Continue to follow renal function trend.   History of pulmonary embolism LLE DVT - Partially occlusive peripheral thrombus in the distal left femoral vein and popliteal vein. This could be partially occlusive acute thrombus or recanalized chronic thrombus. - CTA chest negative for PE - Eliquis  on hold; has been on heparin  - Interaction with Eliquis  and Genvoya , so patient Genvoya  has been transitioned to [employer].   Essential hypertension, benign - Continue monitoring BP; currently soft - Patient receiving spironolactone and Coreg - Continue holding ARB's   Human immunodeficiency virus (HIV) disease (HCC) - Updating HIV RNA quantification/viral load and T cells CD4 count - After discussing with infectious disease patient HIV medications will be transitioned to [facility] in order to use Eliquis  for DVT therapy.   GERD   - Continue pantoprazole   -Lifestyle changes discussed with patient.   Substance abuse with hallucination and paranoia - Patient has been using methamphetamines; negative UDS on admission; states has not used in over a week - PRN ativan   -  Appreciate assistance and recommendation by psychiatry service.   History of alcohol abuse, with no signs of acute withdrawal.  - Cessation counseling provided and patient advised to keep himself in complete abstinence - Thiamine   and folic acid recommended.   Right shoulder pain, continue oral analgesics  Antimicrobials: None   DVT prophylaxis:  apixaban  (ELIQUIS ) tablet 2.5 mg Start: 03/10/24 2200 SCDs Start: 03/04/24 1935 apixaban  (ELIQUIS ) tablet 2.5 mg  apixaban  (ELIQUIS ) tablet 5 mg   Code Status:   Code Status: Full Code  Mobility Assessment (Last 72 Hours)     Mobility Assessment     Row Name 03/10/24 0900 03/09/24 2047 03/09/24 0727 03/08/24 2100 03/08/24 1000   Does patient have an order for bedrest or is patient medically unstable No - Continue assessment No - Continue assessment No - Continue assessment No - Continue assessment No - Continue assessment   What is the highest level of mobility based on the progressive mobility assessment? Level 4 (Walks with assist in room) - Balance while marching in place and cannot step forward and back - Complete Level 4 (Walks with assist in room) - Balance while marching in place and cannot step forward and back - Complete Level 4 (Walks with assist in room) - Balance while marching in place and cannot step forward and back - Complete Level 4 (Walks with assist in room) - Balance while marching in place and cannot step forward and back - Complete Level 5 (Walks with assist in room/hall) - Balance while stepping forward/back and can walk in room with assist - Complete    Row Name 03/07/24 2045           Does patient have an order for bedrest or is patient medically unstable No - Continue assessment       What is the highest level of mobility based on the progressive mobility assessment? Level 5 (Walks with assist in room/hall) - Balance while stepping forward/back and can walk in room with assist - Complete                Barriers to discharge: Completed workup and transition of blood thinners.  Disposition Plan:  Home HH orders placed: n/a Status is: Inpt  Objective: Blood pressure 90/70, pulse 82, temperature (!) 96.3 F (35.7 C), temperature  source Axillary, resp. rate 18, height 6' (1.829 m), weight 77.7 kg, SpO2 93%.   Examination:  General exam: Alert, awake, following commands and in no acute distress. Respiratory system: Good saturation on room air.  No using accessory muscle. Cardiovascular system: Rate controlled. No rubs or gallops; no JVD on exam. Gastrointestinal system: Abdomen is nondistended, soft and nontender. No organomegaly or masses felt. Normal bowel sounds heard. Central nervous system: Moving 4 limbs spontaneously.  No focal neurological deficits. Extremities: No cyanosis or clubbing.  No edema. Skin: No petechiae; right upper extremity with PICC line in place. Psychiatry: Flat affect appreciated on exam.    Consultants:  Cardiology  Procedures:  03/08/2024: Right/left heart cath  Data Reviewed: Results for orders placed or performed during the hospital encounter of 03/04/24 (from the past 24 hours)  Basic metabolic panel with GFR     Status: Abnormal   Collection Time: 03/10/24  5:00 AM  Result Value Ref Range   Sodium 137 135 - 145 mmol/L   Potassium 4.1 3.5 - 5.1 mmol/L   Chloride 99 98 - 111 mmol/L   CO2 26 22 - 32 mmol/L   Glucose, Bld 135 (H) 70 -  99 mg/dL   BUN 20 6 - 20 mg/dL   Creatinine, Ser 1.61 0.61 - 1.24 mg/dL   Calcium  9.7 8.9 - 10.3 mg/dL   GFR, Estimated >09 >60 mL/min   Anion gap 12 5 - 15  CBC     Status: Abnormal   Collection Time: 03/10/24  5:00 AM  Result Value Ref Range   WBC 5.2 4.0 - 10.5 K/uL   RBC 5.87 (H) 4.22 - 5.81 MIL/uL   Hemoglobin 16.7 13.0 - 17.0 g/dL   HCT 45.4 09.8 - 11.9 %   MCV 86.9 80.0 - 100.0 fL   MCH 28.4 26.0 - 34.0 pg   MCHC 32.7 30.0 - 36.0 g/dL   RDW 14.7 82.9 - 56.2 %   Platelets 168 150 - 400 K/uL   nRBC 0.0 0.0 - 0.2 %  Protime-INR     Status: Abnormal   Collection Time: 03/10/24  5:00 AM  Result Value Ref Range   Prothrombin Time 17.3 (H) 11.4 - 15.2 seconds   INR 1.4 (H) 0.8 - 1.2  Cooxemetry Panel (carboxy, met, total hgb, O2 sat)      Status: Abnormal   Collection Time: 03/10/24  6:36 AM  Result Value Ref Range   Total hemoglobin 17.3 (H) 12.0 - 16.0 g/dL   O2 Saturation 72 %   Carboxyhemoglobin 2.0 (H) 0.5 - 1.5 %   Methemoglobin <0.7 0.0 - 1.5 %    I have reviewed pertinent nursing notes, vitals, labs, and images as necessary. I have ordered labwork to follow up on as indicated.  I have reviewed the last notes from staff over past 24 hours. I have discussed patient's care plan and test results with nursing staff, CM/SW, and other staff as appropriate.  Time spent: Greater than 50% of the 55 minute visit was spent in counseling/coordination of care for the patient as laid out in the A&P.   LOS: 5 days   Justina Oman, MD  [facility] 03/10/2024, 6:02 PM

## 2024-03-10 NOTE — Progress Notes (Addendum)
 Patient ID: Devin [name], male   DOB: [birth year], 63 y.o.   MRN: [id number]     Advanced Heart Failure Rounding Note  Cardiologist: None  Chief Complaint: CHF  Subjective:    Co-ox 72%. CVP 4. Wt continues to trend down post cath.      Scr stable, 1.1. SBPs 90s.   On coumadin + Lovenox bridge for DVT. INR 1.4.   LHC/RHC showed minimal coronary disease and normal filling pressures.  Low but not markedly low CI at 2.14.    Objective:   Weight Range: 78.2 kg Body mass index is 23.38 kg/m.   Vital Signs:   Temp:  [97.6 F (36.4 C)-98.6 F (37 C)] 98 F (36.7 C) (05/14 0020) Pulse Rate:  [97-103] 97 (05/14 0020) Resp:  [18] 18 (05/14 0020) BP: (91-114)/(75-94) 91/75 (05/14 0020) SpO2:  [93 %-96 %] 93 % (05/14 0020) Last BM Date : 03/09/24  Weight change: Filed Weights   03/07/24 0341 03/08/24 0416 03/09/24 0709  Weight: 79.2 kg 80.7 kg 78.2 kg    Intake/Output:   Intake/Output Summary (Last 24 hours) at 03/10/2024 0811 Last data filed at 03/10/2024 4098 Gross per 24 hour  Intake 120 ml  Output 1000 ml  Net -880 ml      Physical Exam    General:  disheveled appearing, looks older than age. No respiratory difficulty HEENT: normal Neck: supple. JVD not elevated Carotids 2+ bilat; no bruits. No lymphadenopathy or thyromegaly appreciated. Cor: PMI nondisplaced. Regular rate & rhythm. No rubs, gallops or murmurs. Lungs: clear Abdomen: soft, nontender, nondistended. No hepatosplenomegaly. No bruits or masses. Good bowel sounds. Extremities: no cyanosis, clubbing, rash, edema + RUE PICC Neuro: alert & oriented x 3, cranial nerves grossly intact. moves all 4 extremities w/o difficulty. Affect pleasant.   Telemetry   NSR 90s (personally reviewed)  Labs    CBC Recent Labs    03/09/24 0431 03/10/24 0500  WBC 5.5 5.2  HGB 16.4 16.7  HCT 50.5 51.0  MCV 88.9 86.9  PLT 174 168   Basic Metabolic Panel Recent Labs    11/91/47 0431 03/10/24 0500  NA 137 137   K 4.4 4.1  CL 100 99  CO2 28 26  GLUCOSE 134* 135*  BUN 16 20  CREATININE 1.37* 1.11  CALCIUM  9.5 9.7   Liver Function Tests No results for input(s): "AST", "ALT", "ALKPHOS", "BILITOT", "PROT", "ALBUMIN" in the last 72 hours.  No results for input(s): "LIPASE", "AMYLASE" in the last 72 hours. Cardiac Enzymes No results for input(s): "CKTOTAL", "CKMB", "CKMBINDEX", "TROPONINI" in the last 72 hours.  BNP: BNP (last 3 results) Recent Labs    03/06/24 0312 03/07/24 0600 03/08/24 0321  BNP 2,092.4* 680.5* 483.3*    ProBNP (last 3 results) No results for input(s): "PROBNP" in the last 8760 hours.   D-Dimer No results for input(s): "DDIMER" in the last 72 hours. Hemoglobin A1C No results for input(s): "HGBA1C" in the last 72 hours. Fasting Lipid Panel No results for input(s): "CHOL", "HDL", "LDLCALC", "TRIG", "CHOLHDL", "LDLDIRECT" in the last 72 hours. Thyroid Function Tests No results for input(s): "TSH", "T4TOTAL", "T3FREE", "THYROIDAB" in the last 72 hours.  Invalid input(s): "FREET3"   Other results:   Imaging    No results found.     Medications:     Scheduled Medications:  atorvastatin   10 mg Oral Daily   carvedilol  3.125 mg Oral BID WC   Chlorhexidine Gluconate Cloth  6 each Topical Daily   dapagliflozin  propanediol  10 mg Oral Daily   digoxin  0.125 mg Oral Daily   DULoxetine   60 mg Oral Daily   elvitegravir-cobicistat-emtricitabine -tenofovir   1 tablet Oral Q breakfast   enoxaparin (LOVENOX) injection  80 mg Subcutaneous Q12H   gabapentin   200 mg Oral TID   guaiFENesin  1,200 mg Oral BID   loratadine  10 mg Oral Daily   OLANZapine   10 mg Oral QHS   pantoprazole   40 mg Oral Daily   sacubitril-valsartan  1 tablet Oral BID   sodium chloride  flush  10-40 mL Intracatheter Q12H   sodium chloride  flush  3 mL Intravenous Q12H   spironolactone  25 mg Oral Daily   warfarin  5 mg Oral ONCE-1600   Warfarin - Pharmacist Dosing Inpatient   Does not  apply q1600    Infusions:    PRN Medications: acetaminophen , albuterol , cyclobenzaprine , hydrOXYzine , LORazepam , ondansetron  (ZOFRAN ) IV, mouth rinse, oxyCODONE , sodium chloride  flush, sodium chloride  flush   Assessment/Plan   1. Acute systolic CHF: Patient presented with CHF, symptoms severely for about a week.  Echo showed EF < 20%, severe RV dysfunction, mild RV enlargement.  No prior history of CHF.  HS-TnI mildly elevated with no trend, suspect demand ischemia from volume overload.  No strong family history of CAD or cardiomyopathy.  Of note, he has regularly used methamphetamine for about a year though UDS was negative this admission. Cath with no significant CAD.  Cardiac index was low but not markedly low (CI 2.14).  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 He is not volume overloaded on exam, CVP remains < 5.  Co-ox 72%. SBPs too soft for further GDMT titration   - Continue Coreg 3.125 mg bid.  - Continue digoxin 0.125 daily. Check dig level in am  - Continue spironolactone 25 mg daily.  - Continue Farxiga 10 mg daily.   - Continue Entresto 24/26 bid.  - Does not need loop diuretic  - Cardiac MRI today to assess for infiltrative disease.  - Not advanced therapies candidate at this time with active methamphetamine abuse.  2. PE/DVT: Patient has history of prior PE.  CTA chest this admission was negative but venous dopplers showed LLE DVT.  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  - Factor V Leiden, prothrombin gene mutation, lupus anticoagulant all pending.  - Will use Lovenox bridge to warfarin for anticoagulation given interaction between DOACs and Genvoya . INR 1.4 today   3. HIV: Continue home anti-retrovirals.  4. Smoking/COPD: Counseled cessation.  5. Methamphetamine: Counseled cessation.    Ruddy Corral, PA-C  03/10/2024 8:11 AM  Patient seen with PA, agree with the above note.   No dyspnea, feels good.  SBP 90s-110s, creatinine 1.11.   Co-ox 71%, CVP < 5.    INR 1.4 today.   General: NAD Neck: No JVD, no thyromegaly or thyroid nodule.  Lungs: Clear to auscultation bilaterally with normal respiratory effort. CV: Nondisplaced PMI.  Heart regular S1/S2, no S3/S4, no murmur.  No peripheral edema.    Abdomen: Soft, nontender, no hepatosplenomegaly, no distention.  Skin: Intact without lesions or rashes.  Neurologic: Alert and oriented x 3.  Psych: Normal affect. Extremities: No clubbing or cyanosis.  HEENT: Normal.   Stable today, good co-ox and low CVP.  No BP room to titrate GDMT as above, continue current meds.  No Lasix  for now.   INR 1.4, on warfarin + Lovenox.   He was unable to tolerate cardiac MRI (claustrophobia).   If Lovenox bridge can be arranged for home,  he can go home today.   Peder Bourdon 03/10/2024 8:58 AM

## 2024-03-10 NOTE — Plan of Care (Signed)

## 2024-03-10 NOTE — Progress Notes (Signed)
 Patient's Bp has dropped further to 86/66 with diaphoresis. Patient upset about not being able to receive oxycodone  at the moment and wants to leave. Justina Oman, MD made aware.

## 2024-03-10 NOTE — Plan of Care (Signed)

## 2024-03-10 NOTE — Progress Notes (Signed)
 This RN notified provider of patient's BP being low with a reading of 90/70. Also notified of patient's feet being cyanotic which is a change from my morning assessment. Hold Coreg per MD. Use the doppler for pedal pulses. No new orders.

## 2024-03-10 NOTE — Progress Notes (Signed)
 CSW was asked to assist patient with a cell phone as he has no means to communicate with clinic for upcoming appointments and transportation. CSW provided patient with a cell phone with 3 months of minutes. Aubry Blase, LCSW, CCSW-MCS [phone]

## 2024-03-11 ENCOUNTER — Ambulatory Visit (HOSPITAL_COMMUNITY): Payer: Self-pay | Admitting: Cardiology

## 2024-03-11 ENCOUNTER — Other Ambulatory Visit (HOSPITAL_COMMUNITY): Payer: Self-pay

## 2024-03-11 LAB — COOXEMETRY PANEL
Carboxyhemoglobin: 1.5 % (ref 0.5–1.5)
Methemoglobin: 0.7 % (ref 0.0–1.5)
O2 Saturation: 62.2 %
Total hemoglobin: 16.4 g/dL — ABNORMAL HIGH (ref 12.0–16.0)

## 2024-03-11 LAB — CBC
HCT: 49 % (ref 39.0–52.0)
Hemoglobin: 16.1 g/dL (ref 13.0–17.0)
MCH: 28.8 pg (ref 26.0–34.0)
MCHC: 32.9 g/dL (ref 30.0–36.0)
MCV: 87.5 fL (ref 80.0–100.0)
Platelets: 171 10*3/uL (ref 150–400)
RBC: 5.6 MIL/uL (ref 4.22–5.81)
RDW: 12.8 % (ref 11.5–15.5)
WBC: 5.9 10*3/uL (ref 4.0–10.5)
nRBC: 0 % (ref 0.0–0.2)

## 2024-03-11 LAB — BASIC METABOLIC PANEL WITH GFR
Anion gap: 8 (ref 5–15)
BUN: 21 mg/dL — ABNORMAL HIGH (ref 6–20)
CO2: 25 mmol/L (ref 22–32)
Calcium: 8.9 mg/dL (ref 8.9–10.3)
Chloride: 100 mmol/L (ref 98–111)
Creatinine, Ser: 1.19 mg/dL (ref 0.61–1.24)
GFR, Estimated: >60 mL/min (ref >60)
Glucose, Bld: 110 mg/dL — ABNORMAL HIGH (ref 70–99)
Potassium: 4.4 mmol/L (ref 3.5–5.1)
Sodium: 133 mmol/L — ABNORMAL LOW (ref 135–145)

## 2024-03-11 LAB — DIGOXIN LEVEL: Digoxin Level: 0.4 ng/mL — ABNORMAL LOW (ref 0.8–2.0)

## 2024-03-11 LAB — T-HELPER CELLS (CD4) COUNT (NOT AT ARMC)
CD4 % Helper T Cell: 25 % — ABNORMAL LOW (ref 33–65)
CD4 T Cell Abs: 636 /uL (ref 400–1790)

## 2024-03-11 LAB — FACTOR 5 LEIDEN

## 2024-03-11 MED ORDER — DULOXETINE HCL 60 MG PO CPEP
60.0000 mg | ORAL_CAPSULE | Freq: Every day | ORAL | 0 refills | Status: DC
  Filled 2024-03-11: qty 30, 30d supply, fill #0

## 2024-03-11 MED ORDER — BICTEGRAVIR-EMTRICITAB-TENOFOV 50-200-25 MG PO TABS
1.0000 | ORAL_TABLET | Freq: Every day | ORAL | 0 refills | Status: DC
  Filled 2024-03-11: qty 30, 30d supply, fill #0

## 2024-03-11 MED ORDER — SPIRONOLACTONE 25 MG PO TABS
12.5000 mg | ORAL_TABLET | Freq: Every day | ORAL | 0 refills | Status: DC
  Filled 2024-03-11: qty 15, 30d supply, fill #0

## 2024-03-11 MED ORDER — LORAZEPAM 1 MG PO TABS
1.0000 mg | ORAL_TABLET | Freq: Three times a day (TID) | ORAL | 0 refills | Status: DC | PRN
  Filled 2024-03-11: qty 10, 4d supply, fill #0

## 2024-03-11 MED ORDER — DAPAGLIFLOZIN PROPANEDIOL 10 MG PO TABS
10.0000 mg | ORAL_TABLET | Freq: Every day | ORAL | 0 refills | Status: DC
  Filled 2024-03-11: qty 30, 30d supply, fill #0

## 2024-03-11 MED ORDER — CARVEDILOL 3.125 MG PO TABS
3.1250 mg | ORAL_TABLET | Freq: Two times a day (BID) | ORAL | 0 refills | Status: DC
  Filled 2024-03-11: qty 60, 30d supply, fill #0

## 2024-03-11 MED ORDER — DIGOXIN 125 MCG PO TABS
0.1250 mg | ORAL_TABLET | Freq: Every day | ORAL | 0 refills | Status: DC
  Filled 2024-03-11: qty 30, 30d supply, fill #0

## 2024-03-11 MED ORDER — ACETAMINOPHEN 325 MG PO TABS: 650.0000 mg | ORAL_TABLET | ORAL | Status: DC | PRN

## 2024-03-11 MED ORDER — APIXABAN 5 MG PO TABS
5.0000 mg | ORAL_TABLET | Freq: Two times a day (BID) | ORAL | 0 refills | Status: DC
  Filled 2024-03-11: qty 60, 30d supply, fill #0

## 2024-03-11 MED ORDER — SACUBITRIL-VALSARTAN 24-26 MG PO TABS
1.0000 | ORAL_TABLET | Freq: Two times a day (BID) | ORAL | 0 refills | Status: DC
  Filled 2024-03-11: qty 60, 30d supply, fill #0

## 2024-03-11 MED ORDER — PANTOPRAZOLE SODIUM 40 MG PO TBEC
40.0000 mg | DELAYED_RELEASE_TABLET | Freq: Every day | ORAL | 0 refills | Status: AC
  Filled 2024-03-11: qty 30, 30d supply, fill #0

## 2024-03-11 NOTE — TOC Benefit Eligibility Note (Signed)
 Pharmacy Patient Advocate Encounter  Insurance verification completed.    The patient is insured through [employer]. Patient has Medicare and is not eligible for a copay card, but may be able to apply for patient assistance or Medicare RX Payment Plan (Patient Must reach out to their plan, if eligible for payment plan), if available.    Ran test claim for Biktarvy and the current 30 day co-pay is $0.   This test claim was processed through [facility]- copay amounts may vary at other pharmacies due to [employer], or as the patient moves through the different stages of their insurance plan.

## 2024-03-11 NOTE — Plan of Care (Signed)

## 2024-03-11 NOTE — Progress Notes (Addendum)
 Patient ID: Devin [name], male   DOB: 25-Jul-1961, 63 y.o.   MRN: [id number]     Advanced Heart Failure Rounding Note  Cardiologist: None   Chief Complaint: Acute systolic CHF  Subjective:    Feels fine. No shortness of breath, orthopnea or PND.  LHC/RHC showed minimal coronary disease and normal filling pressures.  Low but not markedly low CI at 2.14.    Objective:   Weight Range: 78.5 kg Body mass index is 23.47 kg/m.   Vital Signs:   Temp:  [96.3 F (35.7 C)-97.9 F (36.6 C)] 97.9 F (36.6 C) (05/15 0409) Pulse Rate:  [82-95] 93 (05/15 0037) Resp:  [17-18] 17 (05/15 0409) BP: (86-124)/(66-89) 124/69 (05/15 0409) SpO2:  [90 %-98 %] 98 % (05/15 0409) Weight:  [78.5 kg] 78.5 kg (05/15 0600) Last BM Date : 03/09/24  Weight change: Filed Weights   03/09/24 0709 03/10/24 0701 03/11/24 0600  Weight: 78.2 kg 77.7 kg 78.5 kg    Intake/Output:   Intake/Output Summary (Last 24 hours) at 03/11/2024 0707 Last data filed at 03/11/2024 4098 Gross per 24 hour  Intake 772.15 ml  Output 2700 ml  Net -1927.85 ml      Physical Exam  CVP 1 General:  Chronically ill appearing, looks older than stated age Neck: no JVD.  Cor: Regular rate & rhythm. No rubs, gallops or murmurs. Lungs: clear Abdomen: soft, nontender, nondistended.  Extremities: no edema, RUE PICC Neuro: alert & orientedx3. Affect pleasant   Telemetry   SR 90s  Labs    CBC Recent Labs    03/10/24 0500 03/11/24 0500  WBC 5.2 5.9  HGB 16.7 16.1  HCT 51.0 49.0  MCV 86.9 87.5  PLT 168 171   Basic Metabolic Panel Recent Labs    11/91/47 0500 03/11/24 0500  NA 137 133*  K 4.1 4.4  CL 99 100  CO2 26 25  GLUCOSE 135* 110*  BUN 20 21*  CREATININE 1.11 1.19  CALCIUM  9.7 8.9   Liver Function Tests No results for input(s): "AST", "ALT", "ALKPHOS", "BILITOT", "PROT", "ALBUMIN" in the last 72 hours.  No results for input(s): "LIPASE", "AMYLASE" in the last 72 hours. Cardiac Enzymes No results for  input(s): "CKTOTAL", "CKMB", "CKMBINDEX", "TROPONINI" in the last 72 hours.  BNP: BNP (last 3 results) Recent Labs    03/06/24 0312 03/07/24 0600 03/08/24 0321  BNP 2,092.4* 680.5* 483.3*    ProBNP (last 3 results) No results for input(s): "PROBNP" in the last 8760 hours.   D-Dimer No results for input(s): "DDIMER" in the last 72 hours. Hemoglobin A1C No results for input(s): "HGBA1C" in the last 72 hours. Fasting Lipid Panel No results for input(s): "CHOL", "HDL", "LDLCALC", "TRIG", "CHOLHDL", "LDLDIRECT" in the last 72 hours. Thyroid Function Tests No results for input(s): "TSH", "T4TOTAL", "T3FREE", "THYROIDAB" in the last 72 hours.  Invalid input(s): "FREET3"   Other results:   Imaging    No results found.     Medications:     Scheduled Medications:  apixaban   5 mg Oral BID   atorvastatin   10 mg Oral Daily   bictegravir-emtricitabine -tenofovir  AF  1 tablet Oral Daily   carvedilol  3.125 mg Oral BID WC   Chlorhexidine Gluconate Cloth  6 each Topical Daily   dapagliflozin propanediol  10 mg Oral Daily   digoxin  0.125 mg Oral Daily   DULoxetine   60 mg Oral Daily   gabapentin   200 mg Oral TID   guaiFENesin  1,200 mg Oral BID  loratadine  10 mg Oral Daily   midodrine  2.5 mg Oral TID WC   OLANZapine   10 mg Oral QHS   pantoprazole   40 mg Oral Daily   sacubitril-valsartan  1 tablet Oral BID   sodium chloride  flush  10-40 mL Intracatheter Q12H   sodium chloride  flush  3 mL Intravenous Q12H   spironolactone  12.5 mg Oral Daily    Infusions:    PRN Medications: acetaminophen , albuterol , cyclobenzaprine , hydrOXYzine , LORazepam , ondansetron  (ZOFRAN ) IV, mouth rinse, oxyCODONE , sodium chloride  flush, sodium chloride  flush   Assessment/Plan   1. Acute systolic CHF: Patient presented with CHF, symptoms severely for about a week.  Echo showed EF < 20%, severe RV dysfunction, mild RV enlargement.  No prior history of CHF.  HS-TnI mildly elevated with no  trend, suspect demand ischemia from volume overload.  No strong family history of CAD or cardiomyopathy.  Of note, he has regularly used methamphetamine for about a year though UDS was negative this admission. Cath with no significant CAD.  Cardiac index was low but not markedly low (CI 2.14).  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  - CO-OX 62% this morning - He is not volume overloaded. CVP 1. Does not need loop diuretic. - Continue Coreg 3.125 mg bid.  - Continue digoxin 0.125 daily. Check digoxin level - Continue spironolactone 12.5 mg daily.  - Continue Farxiga 10 mg daily.   - Continue Entresto 24/26 bid.  - Do not think he needs midodrine, will discontinue. Soft BP may have been d/t volume depletion. - Not able to complete cMRI d/t claustrophobia - Not advanced therapies candidate at this time with active methamphetamine abuse.  2. PE/DVT: Patient has history of prior PE.  CTA chest this admission was negative but venous dopplers showed LLE DVT.  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  - Factor V Leiden, prothrombin gene mutation, lupus anticoagulant all pending.  - Genvoya  switched to Biktarvy so that Warfarin/Lovenox can be transitioned to Eliquis . 3. HIV: Anti-retrovirals as above - CD4 count pending 4. Smoking/COPD: Counseled cessation.  5. Methamphetamine: Counseled cessation.   Reports compliance with medications as outpatient. Fill history does not support this. If has trouble with compliance going forward, would consider Paramedicine.  Okay for discharge from HF standpoint.   HF medications at discharge: Apixaban  5 BID Atorvastatin  10 mg daily Coreg 3.125 mg BID Farxiga 10 mg daily Digoxin 0.125 mg daily Entresto 24/26 mg BID Spiro 12.5 mg daily  FINCH, LINDSAY N, PA-C  03/11/2024 7:07 AM  Patient seen with PA, I formulated the plan and agree with the above note.   No complaints today, BP stable.  Walking halls without problems.   General: NAD Neck: No  JVD, no thyromegaly or thyroid nodule.  Lungs: Clear to auscultation bilaterally with normal respiratory effort. CV: Nondisplaced PMI.  Heart regular S1/S2, no S3/S4, no murmur.  No peripheral edema.   Abdomen: Soft, nontender, no hepatosplenomegaly, no distention.  Skin: Intact without lesions or rashes.  Neurologic: Alert and oriented x 3.  Psych: Normal affect. Extremities: No clubbing or cyanosis.  HEENT: Normal.   HIV meds adjusted so that he will be able to get Eliquis  for DVT.   CVP < 5 with co-ox 62%.  He does not need a diuretic.  He can go home today on the above regimen with followup in CHF clinic.  BP is stable, do not need further med adjustment and does not need midodrine.   Peder Bourdon 03/11/2024 10:15 AM

## 2024-03-11 NOTE — TOC Transition Note (Signed)
 Transition of Care [facility]) - Discharge Note   Patient Details  Name: Devin [name] MRN: [id number] Date of Birth: [birth year]  [facility] of [facility] CM/SW Contact:  Jennett Model, RN Phone Number: 03/11/2024, 1:11 PM   Clinical Narrative:    For dc today, he has follow up with [facility] here in [place] then he will have follow up in [place] with [facility].  NCM informed patient how important it is for him to keep those apts.  He received a phone from HF CSW to assist him with his appts.  He will be on eliquis  now which is zero co pay per pharmacist.      Barriers to Discharge: Continued Medical Work up   Patient Goals and CMS Choice Patient states their goals for this hospitalization and ongoing recovery are:: return home   Choice offered to / list presented to : Patient [place] ownership interest in [employer].provided to::  (n/a)    Discharge Placement                       Discharge Plan and Services Additional resources added to the After Visit Summary for   In-house Referral: Clinical Social Work                                   Social Drivers of Health (SDOH) Interventions SDOH Screenings   Food Insecurity: No Food Insecurity (03/04/2024)  Housing: Low Risk  (03/04/2024)  Transportation Needs: No Transportation Needs (03/04/2024)  Utilities: Not At Risk (03/04/2024)  Alcohol Screen: Low Risk  (06/17/2023)  Depression (PHQ2-9): Low Risk  (04/04/2022)  Social Connections: Unknown (03/04/2024)  Tobacco Use: High Risk (03/04/2024)     Readmission Risk Interventions    03/05/2024   10:39 AM  Readmission Risk Prevention Plan  Transportation Screening Complete  HRI or Home Care Consult Complete  Social Work Consult for Recovery Care Planning/Counseling Complete  Palliative Care Screening Not Applicable  Medication Review [employer]) Complete

## 2024-03-11 NOTE — Plan of Care (Signed)
  Problem: Education: Goal: Knowledge of General Education information will improve Description: Including pain rating scale, medication(s)/side effects and non-pharmacologic comfort measures Outcome: Progressing   Problem: Clinical Measurements: Goal: Ability to maintain clinical measurements within normal limits will improve Outcome: Progressing   Problem: Activity: Goal: Risk for activity intolerance will decrease Outcome: Progressing   Problem: Pain Managment: Goal: General experience of comfort will improve and/or be controlled Outcome: Progressing   Problem: Cardiovascular: Goal: Ability to achieve and maintain adequate cardiovascular perfusion will improve Outcome: Progressing

## 2024-03-11 NOTE — Discharge Summary (Signed)
 Physician Discharge Summary   Patient: Devin [name] MRN: [id number] DOB: [birth year]  Admit date:     03/04/2024  Discharge date: 03/11/24  Discharge Physician: Curlee Doss Nancyann Cotterman   PCP: Patient, No Pcp Per   Recommendations at discharge:    Patient has been placed on guideline directed medical therapy with SGLT 2 inh, entresto, spironolactone and carvedilol.  On digoxin for low EF heart failure.  Follow up renal function and electrolytes in 7 days as outpatient  Follow up with primary care in 7 to 10 days. Follow up with Cardiology as scheduled.  Resumed anticoagulation with apixaban .  Changed anti retroviral therapy to avoid drug drug interaction with apixaban .  Added as needed lorazepam  for anxiety  Advised to avoid substance use.   Discharge Diagnoses: Principal Problem:   Acute systolic (congestive) heart failure (HCC) Active Problems:   Essential hypertension, benign   Human immunodeficiency virus (HIV) disease (HCC)   GERD   Substance abuse (HCC)   History of pulmonary embolism   Acute exacerbation of CHF (congestive heart failure) (HCC)   DVT (deep venous thrombosis) (HCC)  Resolved Problems:   * No resolved hospital problems. *  Hospital Course: 63 y.o. male with medical history significant of COPD, hypertension, history of pulmonary embolism, HIV infection and scoliosis who presented with lower extremity edema and dyspnea.  Patient reported 4 weeks of worsening lower extremity edema, but more severe over the last 2 days prior to admission. It was associated with exertional dyspnea but not frank angina symptoms.  On his initial physical examination his blood pressure was 166/137, HR 109, RR 20 and 02 saturation 91%  Lungs with no wheezing, positive rales at bases, heart with S1 and S2 present and regular with no gallops or rubs, abdomen with no distention and positive lower extremity edema, pitting +++.  Na 136, K 4.9 Cl 101 bicarbonate 26 bun 13 cr 1,26  High  sensitive troponin 64 and 66 Wbc 4.4 hgb 14.7 plt 185    Chest radiograph with hypoinflation, with positive cardiomegaly, bilateral hilar vascular congestion and bilateral pleural effusions, fluid in the right fissure  CT chest with no evidence of pulmonary embolism.  Mild to moderate bilateral pleural effusions. Bilateral ground glass opacities.  Minimal pericardial effusion  Mild ascites    Lower extremities US  with partially occlusive peripheral thrombus in the distal left femoral vein and popliteal vein. This could be a partially occlusive acute thrombus or recanalized chronic thrombus.  No evidence of right lower extremity DVT.    EKG 116 bpm, normal axis, normal intervals, qtc 477, sinus rhythm with poor R R wave progression with no significant ST segment or T wave changes. Positive LVH.   Patient was placed on IV furosemide  for diuresis.  Echocardiogram with reduced LV systolic function.   05/10 transferred to [facility] for advanced heart failure management. 05/12 cardiac catheterization with normal filling pressures and minimal coronary artery disease.  Unable to perform cardiac MRI due to claustrophobia.  05/15 improved volume status, plan to continue medical therapy as outpatient.    Assessment and Plan: * Acute systolic (congestive) heart failure (HCC) Echocardiogram with reduced LV systolic function < 20%, severe LV cavity dilatation, RV systolic function with severe reduction, RV with mild enlargement, RVSP 37.1 mmHg, RA with severe dilatation, no significant valvular disease.   05/12 cardiac catheterization  RA 5  RV 32/7 PA 34/14 mean 21  PCWP 8  Cardiac output 4,35 and index 2,14 (Fick)   Patient was placed  on aggressive diuretic therapy with IV furosemide , negative fluid balance was achieved, -9,551 ml, with significant improvement in his symptoms.   Plan to continue medical therapy with SGLT 2 inh, entresto, carvedilol, and spironolactone   Coronary angiography with  no minimal coronary artery disease, non ischemic cardiomyopathy. Troponin elevation due to heart failure exacerbation.  Essential hypertension, benign Continue blood pressure control with Entreso and carvedilol.   Human immunodeficiency virus (HIV) disease (HCC) Resume antiretroviral therapy, changed to Biktarvy, to avoid drug drug interaction with apixaban .   GERD Continue pantoprazole    Substance abuse (HCC) Patient has been using methamphetamines.  Positive anxiety. Plan to continue with as needed alprazolam.    History of alcohol abuse, with no signs of acute withdrawal.   Right shoulder pain, continue oral analgesics   History of pulmonary embolism Left non occlusive thrombus, possible acute vs recannulization Considering worsening symptoms and prior history of PE  Decision was made to continue anticoagulation with apixaban .          Consultants: cardiology  Procedures performed: cardiac catheterization    Disposition: Home Diet recommendation:  Cardiac diet DISCHARGE MEDICATION: Allergies as of 03/11/2024   No Known Allergies      Medication List     STOP taking these medications    albuterol  108 (90 Base) MCG/ACT inhaler Commonly known as: VENTOLIN  HFA   atorvastatin  10 MG tablet Commonly known as: LIPITOR   cyclobenzaprine  10 MG tablet Commonly known as: FLEXERIL    diazepam  10 MG tablet Commonly known as: VALIUM    docusate sodium  100 MG capsule Commonly known as: COLACE   gabapentin  100 MG capsule Commonly known as: NEURONTIN    Genvoya  150-150-200-10 MG Tabs tablet Generic drug: elvitegravir-cobicistat-emtricitabine -tenofovir    hydrOXYzine  25 MG tablet Commonly known as: ATARAX    lidocaine  5 % Commonly known as: LIDODERM    methocarbamol  500 MG tablet Commonly known as: ROBAXIN    OLANZapine  10 MG tablet Commonly known as: ZYPREXA    risperiDONE  0.5 MG tablet Commonly known as: RISPERDAL        TAKE these medications     acetaminophen  325 MG tablet Commonly known as: TYLENOL  Take 2 tablets (650 mg total) by mouth every 4 (four) hours as needed for headache or mild pain (pain score 1-3).   apixaban  5 MG Tabs tablet Commonly known as: ELIQUIS  Take 1 tablet (5 mg total) by mouth 2 (two) times daily.   bictegravir-emtricitabine -tenofovir  AF 50-200-25 MG Tabs tablet Commonly known as: BIKTARVY Take 1 tablet by mouth daily. Start taking on: Mar 12, 2024   carvedilol 3.125 MG tablet Commonly known as: COREG Take 1 tablet (3.125 mg total) by mouth 2 (two) times daily with a meal.   dapagliflozin propanediol 10 MG Tabs tablet Commonly known as: FARXIGA Take 1 tablet (10 mg total) by mouth daily. Start taking on: Mar 12, 2024   digoxin 0.125 MG tablet Commonly known as: LANOXIN Take 1 tablet (0.125 mg total) by mouth daily. Start taking on: Mar 12, 2024   DULoxetine  60 MG capsule Commonly known as: CYMBALTA  Take 1 capsule (60 mg total) by mouth daily.   LORazepam  1 MG tablet Commonly known as: ATIVAN  Take 1 tablet (1 mg total) by mouth every 8 (eight) hours as needed for anxiety.   pantoprazole  40 MG tablet Commonly known as: PROTONIX  Take 1 tablet (40 mg total) by mouth daily.   sacubitril-valsartan 24-26 MG Commonly known as: ENTRESTO Take 1 tablet by mouth 2 (two) times daily.   spironolactone 25 MG tablet Commonly known as:  ALDACTONE Take 0.5 tablets (12.5 mg total) by mouth daily. Start taking on: Mar 12, 2024        Follow-up Information     [facility] Follow up.   Specialty: Cardiology Why: 03/23/24 10:30 AM  Hospital Follow-up in the Advanced Heart Failure Clinic at [facility], Entrance C Contact information: [address] [place]  [phone]               Discharge Exam: Devin [name] Weights   03/09/24 0709 03/10/24 0701 03/11/24 0600  Weight: 78.2 kg 77.7 kg 78.5 kg   BP 116/77  (BP Location: Left Leg)   Pulse 92   Temp (!) 97.5 F (36.4 C) (Oral)   Resp 18   Ht 6' (1.829 m)   Wt 78.5 kg   SpO2 96%   BMI 23.47 kg/m   Neurology awake and alert ENT with mild pallor with no icterus Cardiovascular with S1 and S2 present and regular with no gallops, rubs or murmurs No JVD  Respiratory with no rales or wheezing no rhonchi  Abdomen with no distention  No lower extremity edema   Condition at discharge: stable  The results of significant diagnostics from this hospitalization (including imaging, microbiology, ancillary and laboratory) are listed below for reference.   Imaging Studies: CARDIAC CATHETERIZATION Result Date: 03/08/2024 1. Minimal coronary disease, nonischemic cardiomyopathy. 2. Normal filling pressures. 3. Low but not markedly low cardiac output (CI 2.14)   US  EKG SITE RITE Result Date: 03/06/2024 If Site Rite image not attached, placement could not be confirmed due to current cardiac rhythm.  ECHOCARDIOGRAM COMPLETE Result Date: 03/05/2024    ECHOCARDIOGRAM REPORT   Patient Name:   Jaimere O Sigl Date of Exam: 03/05/2024 Medical Rec #:  [id number]    Height:       72.0 in Accession #:    [id number]   Weight:       182.0 lb Date of Birth:  01-12-1961    BSA:          2.047 m Patient Age:    62 years     BP:           116/76 mmHg Patient Gender: M            HR:           110 bpm. Exam Location:  [facility] Procedure: 2D Echo, Cardiac Doppler, Color Doppler and Intracardiac            Opacification Agent (Both Spectral and Color Flow Doppler were            utilized during procedure). REPORT CONTAINS CRITICAL RESULT Indications:    R06.02 SOB  History:        Patient has no prior history of Echocardiogram examinations.                 CHF, Signs/Symptoms:Chest Pain and Altered Mental Status; Risk                 Factors:Hypertension and Current Smoker. Substance abuse.                 Pulmonary embolus. ETOH. HIV.  Sonographer:    Raynelle Callow RDCS Referring Phys:  [id number] [facility] Katleen Carraway  Sonographer Comments: Technically difficult study due to poor echo windows. Image acquisition challenging due to uncooperative patient. IMPRESSIONS  1. No LV thrombus by Definity. Left ventricular ejection fraction, by estimation, is <20%.  The left ventricle has severely decreased function. The left ventricle has no regional wall motion abnormalities. The left ventricular internal cavity size was severely dilated. Indeterminate diastolic filling due to E-A fusion. Elevated left atrial pressure.  2. Right ventricular systolic function is severely reduced. The right ventricular size is mildly enlarged. There is mildly elevated pulmonary artery systolic pressure. The estimated right ventricular systolic pressure is 37.1 mmHg.  3. Right atrial size was severely dilated.  4. The mitral valve is abnormal. No evidence of mitral valve regurgitation. No evidence of mitral stenosis. Moderate mitral annular calcification.  5. The aortic valve was not well visualized. Aortic valve regurgitation is not visualized. No aortic stenosis is present.  6. The inferior vena cava is dilated in size with <50% respiratory variability, suggesting right atrial pressure of 15 mmHg. Comparison(s): No prior Echocardiogram. FINDINGS  Left Ventricle: No LV thrombus by Definity. Left ventricular ejection fraction, by estimation, is <20%. The left ventricle has severely decreased function. The left ventricle has no regional wall motion abnormalities. Definity contrast agent was given IV to delineate the left ventricular endocardial borders. Strain was performed and the global longitudinal strain is indeterminate. The left ventricular internal cavity size was severely dilated. There is no left ventricular hypertrophy. Indeterminate diastolic filling due to E-A fusion. Elevated left atrial pressure. Right Ventricle: The right ventricular size is mildly enlarged. No increase in right ventricular wall thickness. Right  ventricular systolic function is severely reduced. There is mildly elevated pulmonary artery systolic pressure. The tricuspid regurgitant velocity is 2.35 m/s, and with an assumed right atrial pressure of 15 mmHg, the estimated right ventricular systolic pressure is 37.1 mmHg. Left Atrium: Left atrial size was normal in size. Right Atrium: Right atrial size was severely dilated. Pericardium: There is no evidence of pericardial effusion. Mitral Valve: The mitral valve is abnormal. Moderate mitral annular calcification. No evidence of mitral valve regurgitation. No evidence of mitral valve stenosis. Tricuspid Valve: The tricuspid valve is normal in structure. Tricuspid valve regurgitation is mild . No evidence of tricuspid stenosis. Aortic Valve: The aortic valve was not well visualized. Aortic valve regurgitation is not visualized. No aortic stenosis is present. Pulmonic Valve: The pulmonic valve was not well visualized. Pulmonic valve regurgitation is not visualized. No evidence of pulmonic stenosis. Aorta: The aortic root is normal in size and structure. Venous: The inferior vena cava is dilated in size with less than 50% respiratory variability, suggesting right atrial pressure of 15 mmHg. IAS/Shunts: No atrial level shunt detected by color flow Doppler. Additional Comments: 3D was performed not requiring image post processing on an independent workstation and was indeterminate.  LEFT VENTRICLE PLAX 2D LVIDd:         6.90 cm      Diastology LVIDs:         6.10 cm      LV e' medial:    6.96 cm/s LV PW:         1.00 cm      LV E/e' medial:  9.7 LV IVS:        1.20 cm      LV e' lateral:   10.10 cm/s LVOT diam:     2.30 cm      LV E/e' lateral: 6.7 LV SV:         30 LV SV Index:   15 LVOT Area:     4.15 cm  LV Volumes (MOD) LV vol d, MOD A2C: 165.0 ml LV vol d, MOD A4C:  156.5 ml LV vol s, MOD A2C: 137.0 ml LV vol s, MOD A4C: 144.5 ml LV SV MOD A2C:     28.0 ml LV SV MOD A4C:     156.5 ml LV SV MOD BP:      21.5 ml  RIGHT VENTRICLE            IVC RV S prime:     5.44 cm/s  IVC diam: 3.00 cm TAPSE (M-mode): 1.5 cm LEFT ATRIUM             Index        RIGHT ATRIUM           Index LA diam:        4.20 cm 2.05 cm/m   RA Area:     29.80 cm LA Vol (A2C):   61.4 ml 30.00 ml/m  RA Volume:   112.00 ml 54.72 ml/m LA Vol (A4C):   58.2 ml 28.43 ml/m LA Biplane Vol: 60.1 ml 29.36 ml/m  AORTIC VALVE LVOT Vmax:   60.60 cm/s LVOT Vmean:  37.700 cm/s LVOT VTI:    0.073 m  AORTA Ao Root diam: 3.60 cm MITRAL VALVE               TRICUSPID VALVE MV Area (PHT): 5.42 cm    TR Peak grad:   22.1 mmHg MV Decel Time: 140 msec    TR Vmax:        235.00 cm/s MV E velocity: 67.70 cm/s MV A velocity: 30.14 cm/s  SHUNTS MV E/A ratio:  2.25        Systemic VTI:  0.07 m                            Systemic Diam: 2.30 cm Vishnu Priya Mallipeddi Electronically signed by Lucetta Russel Mallipeddi Signature Date/Time: 03/05/2024/4:13:22 PM    Final    CT Angio Chest Pulmonary Embolism (PE) W or WO Contrast Result Date: 03/04/2024 CLINICAL DATA:  Acute shortness of breath and right shoulder pain. EXAM: CT ANGIOGRAPHY CHEST WITH CONTRAST TECHNIQUE: Multidetector CT imaging of the chest was performed using the standard protocol during bolus administration of intravenous contrast. Multiplanar CT image reconstructions and MIPs were obtained to evaluate the vascular anatomy. RADIATION DOSE REDUCTION: This exam was performed according to the departmental dose-optimization program which includes automated exposure control, adjustment of the mA and/or kV according to patient size and/or use of iterative reconstruction technique. CONTRAST:  75mL OMNIPAQUE  IOHEXOL  350 MG/ML SOLN COMPARISON:  April 24, 2022. FINDINGS: Cardiovascular: Satisfactory opacification of the pulmonary arteries to the segmental level. No evidence of pulmonary embolism. Mild cardiomegaly. Minimal pericardial effusion. Mediastinum/Nodes: No enlarged mediastinal, hilar, or axillary lymph nodes. Thyroid  gland, trachea, and esophagus demonstrate no significant findings. Lungs/Pleura: No pneumothorax is noted. Mild to moderate bilateral pleural effusions are noted with minimal adjacent subsegmental atelectasis. Upper Abdomen: Mild ascites is noted around the liver and spleen. Musculoskeletal: Old bilateral rib fractures are noted. Review of the MIP images confirms the above findings. IMPRESSION: No definite evidence of pulmonary embolus. Mild to moderate bilateral pleural effusions are noted with adjacent minimal subsegmental atelectasis. Mild ascites is noted in visualized portion of upper abdomen. Minimal pericardial effusion. Electronically Signed   By: Rosalene Colon M.D.   On: 03/04/2024 17:40   US  Venous Img Lower Bilateral (DVT) Result Date: 03/04/2024 CLINICAL DATA:  Bilateral lower extremity edema.  History of DVTs. EXAM: BILATERAL LOWER  EXTREMITY VENOUS DOPPLER ULTRASOUND TECHNIQUE: Gray-scale sonography with graded compression, as well as color Doppler and duplex ultrasound were performed to evaluate the lower extremity deep venous systems from the level of the common femoral vein and including the common femoral, femoral, profunda femoral, popliteal and calf veins including the posterior tibial, peroneal and gastrocnemius veins when visible. The superficial great saphenous vein was also interrogated. Spectral Doppler was utilized to evaluate flow at rest and with distal augmentation maneuvers in the common femoral, femoral and popliteal veins. COMPARISON:  04/07/2019 FINDINGS: RIGHT LOWER EXTREMITY Common Femoral Vein: No evidence of thrombus. Normal compressibility, respiratory phasicity and response to augmentation. Saphenofemoral Junction: No evidence of thrombus. Normal compressibility and flow on color Doppler imaging. Profunda Femoral Vein: No evidence of thrombus. Normal compressibility and flow on color Doppler imaging. Femoral Vein: No evidence of thrombus. Normal compressibility, respiratory  phasicity and response to augmentation. Popliteal Vein: No evidence of thrombus. Normal compressibility, respiratory phasicity and response to augmentation. Calf Veins: No evidence of thrombus. Normal compressibility and flow on color Doppler imaging. Superficial Great Saphenous Vein: No evidence of thrombus. Normal compressibility. Venous Reflux:  None. Other Findings:  None. LEFT LOWER EXTREMITY Common Femoral Vein: No evidence of thrombus. Normal compressibility, respiratory phasicity and response to augmentation. Saphenofemoral Junction: No evidence of thrombus. Normal compressibility and flow on color Doppler imaging. Profunda Femoral Vein: No evidence of thrombus. Normal compressibility and flow on color Doppler imaging. Femoral Vein: Peripheral nonocclusive thrombus noted in the distal left femoral vein. Popliteal Vein: Peripheral nonocclusive thrombus within the left popliteal vein. Calf Veins: No evidence of thrombus. Normal compressibility and flow on color Doppler imaging. Superficial Great Saphenous Vein: No evidence of thrombus. Normal compressibility. Venous Reflux:  None. Other Findings:  None. IMPRESSION: Partially occlusive peripheral thrombus in the distal left femoral vein and popliteal vein. This could be partially occlusive acute thrombus or recanalized chronic thrombus. No evidence of right lower extremity DVT. Electronically Signed   By: Janeece Mechanic M.D.   On: 03/04/2024 17:27   DG Chest Port 1 View Result Date: 03/04/2024 CLINICAL DATA:  Shortness of breath, right shoulder pain and swelling in both legs. Patient fell at home 2-3 days prior. EXAM: PORTABLE CHEST 1 VIEW COMPARISON:  Radiographs 06/22/2023 and 10/17/2022.  CT 04/24/2022. FINDINGS: 1527 hours. Lower lung volumes with mild patient rotation. The heart size and mediastinal contours are stable. There is increased vascular congestion with possible mild edema, a small left pleural effusion and probable mild bibasilar atelectasis. No  confluent airspace disease or pneumothorax. No acute osseous findings are evident. Telemetry leads overlie the chest. IMPRESSION: Lower lung volumes with increased vascular congestion and possible mild edema. Small left pleural effusion and probable mild bibasilar atelectasis. Electronically Signed   By: Elmon Hagedorn M.D.   On: 03/04/2024 15:38    Microbiology: Results for orders placed or performed during the hospital encounter of 06/17/23  SARS Coronavirus 2 by RT PCR (hospital order, performed in [facility] hospital lab) *cepheid single result test* Anterior Nasal Swab     Status: Abnormal   Collection Time: 06/22/23 12:00 PM   Specimen: Anterior Nasal Swab  Result Value Ref Range Status   SARS Coronavirus 2 by RT PCR POSITIVE (A) NEGATIVE Final    Comment: (NOTE) SARS-CoV-2 target nucleic acids are DETECTED  SARS-CoV-2 RNA is generally detectable in upper respiratory specimens  during the acute phase of infection.  Positive results are indicative  of the presence of the identified virus, but do  not rule out bacterial infection or co-infection with other pathogens not detected by the test.  Clinical correlation with patient history and  other diagnostic information is necessary to determine patient infection status.  The expected result is negative.  Fact Sheet for Patients:   RoadLapTop.co.za   Fact Sheet for Healthcare Providers:   http://kim-miller.com/    This test is not yet approved or cleared by the [place]  FDA and  has been authorized for detection and/or diagnosis of SARS-CoV-2 by FDA under an Emergency Use Authorization (EUA).  This EUA will remain in effect (meaning this test can be used) for the duration of  the COVID-19 declaration under Section 564(b)(1)  of the Act, 21 U.S.C. section 360-bbb-3(b)(1), unless the authorization is terminated or revoked sooner.   Performed at [facility], [address] [place], [place] [address]     Labs: CBC: Recent Labs  Lab 03/06/24 1018 03/07/24 0600 03/08/24 0321 03/08/24 0838 03/09/24 0431 03/10/24 0500 03/11/24 0500  WBC 6.0 6.3 6.1  --  5.5 5.2 5.9  NEUTROABS 3.7  --   --   --   --   --   --   HGB 15.5 15.2 15.2 16.0  15.3 16.4 16.7 16.1  HCT 48.1 46.9 46.8 47.0  45.0 50.5 51.0 49.0  MCV 89.9 89.5 89.5  --  88.9 86.9 87.5  PLT 191 176 171  --  174 168 171   Basic Metabolic Panel: Recent Labs  Lab 03/06/24 1018 03/06/24 1519 03/07/24 0943 03/08/24 0321 03/08/24 0838 03/09/24 0431 03/10/24 0500 03/11/24 0500  NA  --    < > 137 135 136  136 137 137 133*  K  --    < > 3.3* 3.8 4.1  4.0 4.4 4.1 4.4  CL  --    < > 91* 94*  --  100 99 100  CO2  --    < > 34* 30  --  28 26 25   GLUCOSE  --    < > 111* 112*  --  134* 135* 110*  BUN  --    < > 15 17  --  16 20 21*  CREATININE  --    < > 1.41* 1.44*  --  1.37* 1.11 1.19  CALCIUM   --    < > 8.9 9.0  --  9.5 9.7 8.9  MG 1.7  --   --   --   --   --   --   --    < > = values in this interval not displayed.   Liver Function Tests: Recent Labs  Lab 03/05/24 1029  AST 30  ALT 22  ALKPHOS 84  BILITOT 0.7  PROT 7.2  ALBUMIN 3.7   CBG: No results for input(s): "GLUCAP" in the last 168 hours.  Discharge time spent: greater than 30 minutes.  Signed: Albertus Alt, MD [facility] 03/11/2024

## 2024-03-11 NOTE — Care Management Important Message (Signed)
 Important Message  Patient Details  Name: Devin [name] MRN: [id number] Date of Birth: [birth year]   Important Message Given:  Yes - Medicare IM     Janith Melnick 03/11/2024, 9:15 AM

## 2024-03-12 LAB — CARDIOLIPIN ANTIBODIES, IGM+IGG
Anticardiolipin IgG: <9 GPL U/mL (ref 0–14)
Anticardiolipin IgM: 10 [MPL'U]/mL (ref 0–12)

## 2024-03-12 LAB — PROTHROMBIN GENE MUTATION

## 2024-03-12 LAB — HIV-1 RNA QUANT-NO REFLEX-BLD
HIV 1 RNA Quant: 570 {copies}/mL
LOG10 HIV-1 RNA: 2.756 {Log_copies}/mL

## 2024-03-13 NOTE — Progress Notes (Incomplete)
 ADVANCED HF CLINIC CONSULT NOTE  Primary Care: Patient, No Pcp Per HF Cardiologist: Dr. Mitzie Anda  HPI:  63 y.o. with history of HIV since 1995 on treatment with Genvoya , prior PE, COPD/active smoker, HTN, back pain/back surgery, and methamphetamine abuse.  He has no longer been taking anticoagulation.  Main complaint generally has been low back pain.  Started methamphetamine about 1.5 years ago to self-medicate when he could no longer get narcotics for his back.   Admitted 5/25 with CP. CTA chest with no PE but venous dopplers showed left-sided DVT (?acute vs chronic). UDS was negative. BNP 1823.  HS-TnI 66 => 64.  He was admitted for diagnosis and management of heart failure.  Echo showed EF < 20%, severe RV dysfunction, mild RV enlargement.  Diuresed with IV lasix . Lower extremity venous dopplers showed left-sided DVT. Underwent LHC/RHC showed minimal coronary disease and normal filling pressures. Low but not markedly low CI at 2.14. GDMT titrated and he was discharged home, weight 173 lbs.  Today she returns for HF follow up. Overall feeling fine. Denies increasing SOB, palpitations, abnormal bleeding, CP, dizziness, edema, or PND/Orthopnea. Appetite ok. No fever or chills. Weight at home 170 pounds. Taking all medications.   ECG (personally reviewed):  Labs (5/25):  Cardiac Studies - R/LHC (5/25):  - Echo (5/25):      Past Medical History:  Diagnosis Date   Anxiety    Arthritis    "I'm eat up w/it" (02/20/2017)   Asthma    Chronic back pain    "the whole back" (02/20/2017)   Chronic bronchitis (HCC)    Complication of anesthesia    "felt like I couldn't breath coming out of it"   COPD (chronic obstructive pulmonary disease) (HCC)    Depression    GERD (gastroesophageal reflux disease)    History of hiatal hernia    History of stomach ulcers    "bleeding ones; I was young then"   HIV infection (HCC) dx'd ~ 1999   Hypertension    Pneumonia    "several times" (02/20/2017)    Prolapsed disk 10/28   Pulmonary embolism (HCC) 02/20/2017   Scoliosis 08/24/13    Current Outpatient Medications  Medication Sig Dispense Refill   acetaminophen  (TYLENOL ) 325 MG tablet Take 2 tablets (650 mg total) by mouth every 4 (four) hours as needed for headache or mild pain (pain score 1-3).     apixaban  (ELIQUIS ) 5 MG TABS tablet Take 1 tablet (5 mg total) by mouth 2 (two) times daily. 60 tablet 0   bictegravir-emtricitabine -tenofovir  AF (BIKTARVY ) 50-200-25 MG TABS tablet Take 1 tablet by mouth daily. 30 tablet 0   carvedilol  (COREG ) 3.125 MG tablet Take 1 tablet (3.125 mg total) by mouth 2 (two) times daily with a meal. 60 tablet 0   dapagliflozin  propanediol (FARXIGA ) 10 MG TABS tablet Take 1 tablet (10 mg total) by mouth daily. 30 tablet 0   digoxin  (LANOXIN ) 0.125 MG tablet Take 1 tablet (0.125 mg total) by mouth daily. 30 tablet 0   DULoxetine  (CYMBALTA ) 60 MG capsule Take 1 capsule (60 mg total) by mouth daily. 30 capsule 0   LORazepam  (ATIVAN ) 1 MG tablet Take 1 tablet (1 mg total) by mouth every 8 (eight) hours as needed for anxiety. 10 tablet 0   pantoprazole  (PROTONIX ) 40 MG tablet Take 1 tablet (40 mg total) by mouth daily. 30 tablet 0   sacubitril -valsartan  (ENTRESTO ) 24-26 MG Take 1 tablet by mouth 2 (two) times daily. 60 tablet 0  spironolactone  (ALDACTONE ) 25 MG tablet Take 0.5 tablets (12.5 mg total) by mouth daily. 15 tablet 0   No current facility-administered medications for this visit.    No Known Allergies    Social History   Socioeconomic History   Marital status: Divorced    Spouse name: Not on file   Number of children: Not on file   Years of education: Not on file   Highest education level: Not on file  Occupational History   Not on file  Tobacco Use   Smoking status: Every Day    Current packs/day: 1.00    Average packs/day: 1 pack/day for 48.0 years (48.0 ttl pk-yrs)    Types: Cigarettes   Smokeless tobacco: Never  Vaping Use   Vaping  status: Never Used  Substance and Sexual Activity   Alcohol use: Not Currently    Comment: last used about 9 years ago   Drug use: Yes    Types: Marijuana    Comment: denies   Sexual activity: Yes    Partners: Female    Comment: declined condoms  Other Topics Concern   Not on file  Social History Narrative   Not on file   Social Drivers of Health   Financial Resource Strain: Not on file  Food Insecurity: No Food Insecurity (03/04/2024)   Hunger Vital Sign    Worried About Running Out of Food in the Last Year: Never true    Ran Out of Food in the Last Year: Never true  Transportation Needs: No Transportation Needs (03/04/2024)   PRAPARE - Administrator, Civil Service (Medical): No    Lack of Transportation (Non-Medical): No  Physical Activity: Not on file  Stress: Not on file  Social Connections: Unknown (03/04/2024)   Social Connection and Isolation Panel [NHANES]    Frequency of Communication with Friends and Family: More than three times a week    Frequency of Social Gatherings with Friends and Family: More than three times a week    Attends Religious Services: Never    Database administrator or Organizations: No    Attends Banker Meetings: Never    Marital Status: Not on file  Intimate Partner Violence: Not At Risk (03/04/2024)   Humiliation, Afraid, Rape, and Kick questionnaire    Fear of Current or Ex-Partner: No    Emotionally Abused: No    Physically Abused: No    Sexually Abused: No      Family History  Problem Relation Age of Onset   Diabetes Father    Stroke Other    Colon cancer Neg Hx    Wt Readings from Last 3 Encounters:  03/11/24 78.5 kg (173 lb 1 oz)  06/17/23 104.6 kg (230 lb 9.6 oz)  05/27/23 104.6 kg (230 lb 9.6 oz)    There were no vitals filed for this visit.  PHYSICAL EXAM: General:  NAD. No resp difficulty HEENT: Normal Neck: Supple. No JVD. Cor: Regular rate & rhythm. No rubs, gallops or murmurs. Lungs:  Clear Abdomen: Soft, nontender, nondistended.  Extremities: No cyanosis, clubbing, rash, edema Neuro: Alert & oriented x 3, moves all 4 extremities w/o difficulty. Affect pleasant.   ASSESSMENT & PLAN: 1. Chronic systolic CHF: Patient presented with CHF, symptoms severely for about a week.  Echo showed EF < 20%, severe RV dysfunction, mild RV enlargement.  No prior history of CHF.  HS-TnI mildly elevated with no trend, suspect demand ischemia from volume overload.  No strong family  history of CAD or cardiomyopathy.  Of note, he has regularly used methamphetamine for about a year though UDS was negative this admission. Cath with no significant CAD.  Cardiac index was low but not markedly low (CI 2.14).  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  - CO-OX 62% this morning - He is not volume overloaded. CVP 1. Does not need loop diuretic. - Continue Coreg  3.125 mg bid.  - Continue digoxin  0.125 daily. Check digoxin  level - Continue spironolactone  12.5 mg daily.  - Continue Farxiga  10 mg daily.   - Continue Entresto  24/26 bid.  - Do not think he needs midodrine , will discontinue. Soft BP may have been d/t volume depletion. - Not able to complete cMRI d/t claustrophobia - Not advanced therapies candidate at this time with active methamphetamine abuse.  2. PE/DVT: Patient has history of prior PE.  CTA chest this admission was negative but venous dopplers showed LLE DVT.  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  - Factor V Leiden, prothrombin gene mutation, lupus anticoagulant all pending.  - Genvoya  switched to Biktarvy  so that Warfarin/Lovenox  can be transitioned to Eliquis . 3. HIV: Anti-retrovirals as above - CD4 count pending 4. Smoking/COPD: Counseled cessation.  5. Methamphetamine: Counseled cessation.    Reports compliance with medications as outpatient. Fill history does not support this. If has trouble with compliance going forward, would consider Paramedicine.   Okay for discharge  from HF standpoint.    HF medications at discharge: Apixaban  5 BID Atorvastatin  10 mg daily Coreg  3.125 mg BID Farxiga  10 mg daily Digoxin  0.125 mg daily Entresto  24/26 mg BID Spiro 12.5 mg daily

## 2024-03-21 ENCOUNTER — Emergency Department (HOSPITAL_COMMUNITY)
Admission: EM | Admit: 2024-03-21 | Discharge: 2024-03-22 | Disposition: A | Discharge status: Other Acute Inpatient Hospital | Source: Home / Self Care | Attending: Emergency Medicine | Admitting: Emergency Medicine

## 2024-03-21 ENCOUNTER — Other Ambulatory Visit: Payer: Self-pay

## 2024-03-21 DIAGNOSIS — R443 Hallucinations, unspecified: Secondary | ICD-10-CM

## 2024-03-21 DIAGNOSIS — I10 Essential (primary) hypertension: Secondary | ICD-10-CM | POA: Diagnosis not present

## 2024-03-21 DIAGNOSIS — F29 Unspecified psychosis not due to a substance or known physiological condition: Secondary | ICD-10-CM | POA: Insufficient documentation

## 2024-03-21 DIAGNOSIS — F22 Delusional disorders: Secondary | ICD-10-CM | POA: Insufficient documentation

## 2024-03-21 DIAGNOSIS — Z21 Asymptomatic human immunodeficiency virus [HIV] infection status: Secondary | ICD-10-CM | POA: Insufficient documentation

## 2024-03-21 LAB — COMPREHENSIVE METABOLIC PANEL WITH GFR
ALT: 21 U/L (ref 0–44)
AST: 36 U/L (ref 15–41)
Albumin: 4.2 g/dL (ref 3.5–5.0)
Alkaline Phosphatase: 69 U/L (ref 38–126)
Anion gap: 9 (ref 5–15)
BUN: 10 mg/dL (ref 6–20)
CO2: 24 mmol/L (ref 22–32)
Calcium: 9 mg/dL (ref 8.9–10.3)
Chloride: 103 mmol/L (ref 98–111)
Creatinine, Ser: 1.28 mg/dL — ABNORMAL HIGH (ref 0.61–1.24)
GFR, Estimated: >60 mL/min (ref >60)
Glucose, Bld: 104 mg/dL — ABNORMAL HIGH (ref 70–99)
Potassium: 3.6 mmol/L (ref 3.5–5.1)
Sodium: 136 mmol/L (ref 135–145)
Total Bilirubin: 0.9 mg/dL (ref 0.0–1.2)
Total Protein: 7.6 g/dL (ref 6.5–8.1)

## 2024-03-21 LAB — CBC
HCT: 39.3 % (ref 39.0–52.0)
Hemoglobin: 12.8 g/dL — ABNORMAL LOW (ref 13.0–17.0)
MCH: 28.5 pg (ref 26.0–34.0)
MCHC: 32.6 g/dL (ref 30.0–36.0)
MCV: 87.5 fL (ref 80.0–100.0)
Platelets: 248 10*3/uL (ref 150–400)
RBC: 4.49 MIL/uL (ref 4.22–5.81)
RDW: 12.8 % (ref 11.5–15.5)
WBC: 6.7 10*3/uL (ref 4.0–10.5)
nRBC: 0 % (ref 0.0–0.2)

## 2024-03-21 LAB — SALICYLATE LEVEL: Salicylate Lvl: <7 mg/dL — ABNORMAL LOW (ref 7.0–30.0)

## 2024-03-21 LAB — ETHANOL: Alcohol, Ethyl (B): <15 mg/dL (ref <15)

## 2024-03-21 LAB — ACETAMINOPHEN LEVEL: Acetaminophen (Tylenol), Serum: <10 ug/mL — ABNORMAL LOW (ref 10–30)

## 2024-03-21 MED ORDER — LORAZEPAM 1 MG PO TABS
1.0000 mg | ORAL_TABLET | Freq: Once | ORAL | Status: AC
  Administered 2024-03-21: 1 mg via ORAL
  Filled 2024-03-21: qty 1

## 2024-03-21 NOTE — BH Assessment (Signed)
 This Clinical research associate contacted [facility] via phone to request an assessment for this patient, request has been made, assessment is currently pending.

## 2024-03-21 NOTE — BH Assessment (Signed)
 Attempted to assess patient but patient currently unable to participate at this time as he is having difficulty being aroused from his sleep. Psyc team to follow

## 2024-03-21 NOTE — ED Notes (Signed)
 ivc prior to arrival/psych consult ordered/pending.Marland KitchenMarland Kitchen
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
## 2024-03-21 NOTE — ED Notes (Signed)
 Pt asleep, will offer snack when pt wakes up.

## 2024-03-22 ENCOUNTER — Inpatient Hospital Stay
Admission: AD | Admit: 2024-03-22 | Discharge: 2024-03-26 | DRG: 885 | Disposition: A | Discharge status: Home / Self Care | Source: Intra-hospital | Attending: Psychiatry | Admitting: Psychiatry

## 2024-03-22 ENCOUNTER — Encounter: Payer: Self-pay | Admitting: Psychiatry

## 2024-03-22 DIAGNOSIS — Z833 Family history of diabetes mellitus: Secondary | ICD-10-CM

## 2024-03-22 DIAGNOSIS — Z7901 Long term (current) use of anticoagulants: Secondary | ICD-10-CM | POA: Diagnosis not present

## 2024-03-22 DIAGNOSIS — Z86711 Personal history of pulmonary embolism: Secondary | ICD-10-CM | POA: Diagnosis not present

## 2024-03-22 DIAGNOSIS — F29 Unspecified psychosis not due to a substance or known physiological condition: Secondary | ICD-10-CM

## 2024-03-22 DIAGNOSIS — J4489 Other specified chronic obstructive pulmonary disease: Secondary | ICD-10-CM | POA: Diagnosis present

## 2024-03-22 DIAGNOSIS — Z79899 Other long term (current) drug therapy: Secondary | ICD-10-CM

## 2024-03-22 DIAGNOSIS — Z823 Family history of stroke: Secondary | ICD-10-CM

## 2024-03-22 DIAGNOSIS — I10 Essential (primary) hypertension: Secondary | ICD-10-CM | POA: Diagnosis present

## 2024-03-22 DIAGNOSIS — K219 Gastro-esophageal reflux disease without esophagitis: Secondary | ICD-10-CM | POA: Diagnosis present

## 2024-03-22 DIAGNOSIS — F419 Anxiety disorder, unspecified: Secondary | ICD-10-CM | POA: Diagnosis present

## 2024-03-22 DIAGNOSIS — G8929 Other chronic pain: Secondary | ICD-10-CM | POA: Diagnosis present

## 2024-03-22 DIAGNOSIS — F17210 Nicotine dependence, cigarettes, uncomplicated: Secondary | ICD-10-CM | POA: Diagnosis present

## 2024-03-22 DIAGNOSIS — F15159 Other stimulant abuse with stimulant-induced psychotic disorder, unspecified: Secondary | ICD-10-CM | POA: Diagnosis present

## 2024-03-22 DIAGNOSIS — S4991XA Unspecified injury of right shoulder and upper arm, initial encounter: Secondary | ICD-10-CM | POA: Diagnosis not present

## 2024-03-22 LAB — RESP PANEL BY RT-PCR (RSV, FLU A&B, COVID)  RVPGX2
Influenza A by PCR: NEGATIVE
Influenza B by PCR: NEGATIVE
Resp Syncytial Virus by PCR: NEGATIVE
SARS Coronavirus 2 by RT PCR: NEGATIVE

## 2024-03-22 LAB — URINE DRUG SCREEN, QUALITATIVE (ARMC ONLY)
Amphetamines, Ur Screen: POSITIVE — AB
Barbiturates, Ur Screen: NOT DETECTED
Benzodiazepine, Ur Scrn: POSITIVE — AB
Cannabinoid 50 Ng, Ur ~~LOC~~: NOT DETECTED
Cocaine Metabolite,Ur ~~LOC~~: NOT DETECTED
MDMA (Ecstasy)Ur Screen: NOT DETECTED
Methadone Scn, Ur: NOT DETECTED
Opiate, Ur Screen: NOT DETECTED
Phencyclidine (PCP) Ur S: NOT DETECTED
Tricyclic, Ur Screen: NOT DETECTED

## 2024-03-22 MED ORDER — BICTEGRAVIR-EMTRICITAB-TENOFOV 50-200-25 MG PO TABS
1.0000 | ORAL_TABLET | Freq: Every day | ORAL | Status: DC
  Administered 2024-03-22: 1 via ORAL
  Filled 2024-03-22: qty 1

## 2024-03-22 MED ORDER — APIXABAN 5 MG PO TABS
5.0000 mg | ORAL_TABLET | Freq: Two times a day (BID) | ORAL | Status: DC
  Administered 2024-03-22: 5 mg via ORAL
  Filled 2024-03-22: qty 1

## 2024-03-22 MED ORDER — ALUM & MAG HYDROXIDE-SIMETH 200-200-20 MG/5ML PO SUSP: 30.0000 mL | ORAL | Status: DC | PRN

## 2024-03-22 MED ORDER — DAPAGLIFLOZIN PROPANEDIOL 10 MG PO TABS
10.0000 mg | ORAL_TABLET | Freq: Every day | ORAL | Status: DC
  Administered 2024-03-22: 10 mg via ORAL
  Filled 2024-03-22: qty 1

## 2024-03-22 MED ORDER — ACETAMINOPHEN 325 MG PO TABS: 650.0000 mg | ORAL_TABLET | ORAL | Status: DC | PRN

## 2024-03-22 MED ORDER — MAGNESIUM HYDROXIDE 400 MG/5ML PO SUSP: 30.0000 mL | Freq: Every day | ORAL | Status: DC | PRN

## 2024-03-22 MED ORDER — OLANZAPINE 5 MG PO TBDP: 5.0000 mg | ORAL_TABLET | Freq: Three times a day (TID) | ORAL | Status: DC | PRN

## 2024-03-22 MED ORDER — LORAZEPAM 1 MG PO TABS: 1.0000 mg | ORAL_TABLET | Freq: Three times a day (TID) | ORAL | Status: DC | PRN

## 2024-03-22 MED ORDER — DULOXETINE HCL 60 MG PO CPEP
60.0000 mg | ORAL_CAPSULE | Freq: Every day | ORAL | Status: DC
  Administered 2024-03-22: 60 mg via ORAL
  Filled 2024-03-22: qty 1

## 2024-03-22 MED ORDER — ACETAMINOPHEN 325 MG PO TABS
650.0000 mg | ORAL_TABLET | Freq: Four times a day (QID) | ORAL | Status: DC | PRN
Start: 2024-03-22 — End: 2024-03-26
  Administered 2024-03-22 – 2024-03-24 (×2): 650 mg via ORAL
  Filled 2024-03-22 (×2): qty 2

## 2024-03-22 MED ORDER — SPIRONOLACTONE 12.5 MG HALF TABLET
12.5000 mg | ORAL_TABLET | Freq: Every day | ORAL | Status: DC
  Administered 2024-03-22: 12.5 mg via ORAL
  Filled 2024-03-22: qty 1

## 2024-03-22 MED ORDER — DIGOXIN 125 MCG PO TABS
0.1250 mg | ORAL_TABLET | Freq: Every day | ORAL | Status: DC
  Administered 2024-03-22: 0.125 mg via ORAL
  Filled 2024-03-22: qty 1

## 2024-03-22 MED ORDER — PANTOPRAZOLE SODIUM 40 MG PO TBEC
40.0000 mg | DELAYED_RELEASE_TABLET | Freq: Every day | ORAL | Status: DC
  Administered 2024-03-22: 40 mg via ORAL
  Filled 2024-03-22: qty 1

## 2024-03-22 MED ORDER — SACUBITRIL-VALSARTAN 24-26 MG PO TABS
1.0000 | ORAL_TABLET | Freq: Two times a day (BID) | ORAL | Status: DC
  Administered 2024-03-22: 1 via ORAL
  Filled 2024-03-22 (×2): qty 1

## 2024-03-22 MED ORDER — CARVEDILOL 6.25 MG PO TABS
3.1250 mg | ORAL_TABLET | Freq: Two times a day (BID) | ORAL | Status: DC
  Administered 2024-03-22: 3.125 mg via ORAL
  Filled 2024-03-22: qty 1

## 2024-03-22 NOTE — ED Notes (Signed)
 Dinner tray provided for pt

## 2024-03-22 NOTE — Group Note (Signed)
 Date:  03/22/2024 Time:  10:00 PM  Group Topic/Focus:  Wrap-Up Group:   The focus of this group is to help patients review their daily goal of treatment and discuss progress on daily workbooks.    Participation Level:  Did Not Attend  Participation Quality:     Affect:     Cognitive:     Insight: None  Engagement in Group:     Modes of Intervention:     Additional Comments:    Rolland Cline 03/22/2024, 10:00 PM
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
## 2024-03-22 NOTE — BH Assessment (Signed)
 Adult/GERO MH  Referral information for Psychiatric Hospitalization faxed to:   [facility] ([phone])   [facility] [phone]- [phone], [phone])   [facility] [phone] or [phone]),  . [facility] [phone]- [phone]),

## 2024-03-22 NOTE — ED Notes (Signed)
 Hospital meal provided, pt tolerated w/o complaints.  Waste discarded appropriately.
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
## 2024-03-22 NOTE — BH Assessment (Signed)
 Patient is to be admitted to [facility] Psych Unit by Dr. Jadapalle on today 03/22/24.  Attending Physician will be Dr. Jadapalle.   Patient has been assigned to room L34, by [facility] Charge Nurse Linsey.     ER staff is aware of the admission: Edwina Gram, ER Secretary   Dr. Peggi Bowels, ER MD  Ivin Marrow, Patient's Nurse  Arnetta Lank, Patient Access.

## 2024-03-22 NOTE — Plan of Care (Signed)
   Problem: Education: Goal: Knowledge of [facility] Education information/materials will improve Outcome: Not Progressing Goal: Emotional status will improve Outcome: Not Progressing Goal: Mental status will improve Outcome: Not Progressing Goal: Verbalization of understanding the information provided will improve Outcome: Not Progressing

## 2024-03-22 NOTE — ED Notes (Signed)
Pt given lunch tray and beverage 
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
## 2024-03-23 ENCOUNTER — Encounter (HOSPITAL_COMMUNITY)

## 2024-03-23 DIAGNOSIS — F29 Unspecified psychosis not due to a substance or known physiological condition: Secondary | ICD-10-CM | POA: Diagnosis not present

## 2024-03-23 MED ORDER — BICTEGRAVIR-EMTRICITAB-TENOFOV 50-200-25 MG PO TABS
1.0000 | ORAL_TABLET | Freq: Every day | ORAL | Status: DC
  Administered 2024-03-23 – 2024-03-26 (×4): 1 via ORAL
  Filled 2024-03-23 (×4): qty 1

## 2024-03-23 MED ORDER — OLANZAPINE 5 MG PO TABS
5.0000 mg | ORAL_TABLET | Freq: Every day | ORAL | Status: DC
  Administered 2024-03-24: 5 mg via ORAL

## 2024-03-23 MED ORDER — NAPROXEN 250 MG PO TABS
250.0000 mg | ORAL_TABLET | Freq: Three times a day (TID) | ORAL | Status: DC
  Administered 2024-03-24 – 2024-03-26 (×7): 250 mg via ORAL
  Filled 2024-03-23 (×9): qty 1

## 2024-03-23 MED ORDER — CARVEDILOL 6.25 MG PO TABS
3.1250 mg | ORAL_TABLET | Freq: Two times a day (BID) | ORAL | Status: DC
  Administered 2024-03-24 – 2024-03-26 (×5): 3.125 mg via ORAL
  Filled 2024-03-23 (×5): qty 1

## 2024-03-23 MED ORDER — DIGOXIN 125 MCG PO TABS
0.1250 mg | ORAL_TABLET | Freq: Every day | ORAL | Status: DC
  Administered 2024-03-23 – 2024-03-26 (×4): 0.125 mg via ORAL
  Filled 2024-03-23 (×4): qty 1

## 2024-03-23 MED ORDER — SACUBITRIL-VALSARTAN 24-26 MG PO TABS
1.0000 | ORAL_TABLET | Freq: Two times a day (BID) | ORAL | Status: DC
  Administered 2024-03-23 – 2024-03-26 (×6): 1 via ORAL
  Filled 2024-03-23 (×6): qty 1

## 2024-03-23 MED ORDER — APIXABAN 5 MG PO TABS
5.0000 mg | ORAL_TABLET | Freq: Two times a day (BID) | ORAL | Status: DC
  Administered 2024-03-23 – 2024-03-26 (×6): 5 mg via ORAL
  Filled 2024-03-23 (×6): qty 1

## 2024-03-23 MED ORDER — PANTOPRAZOLE SODIUM 40 MG PO TBEC
40.0000 mg | DELAYED_RELEASE_TABLET | Freq: Every day | ORAL | Status: DC
  Administered 2024-03-23 – 2024-03-26 (×4): 40 mg via ORAL
  Filled 2024-03-23 (×4): qty 1

## 2024-03-23 MED ORDER — DULOXETINE HCL 60 MG PO CPEP
60.0000 mg | ORAL_CAPSULE | Freq: Every day | ORAL | Status: DC
  Administered 2024-03-23 – 2024-03-26 (×4): 60 mg via ORAL
  Filled 2024-03-23 (×4): qty 1

## 2024-03-23 MED ORDER — DAPAGLIFLOZIN PROPANEDIOL 10 MG PO TABS
10.0000 mg | ORAL_TABLET | Freq: Every day | ORAL | Status: DC
  Administered 2024-03-23 – 2024-03-26 (×4): 10 mg via ORAL
  Filled 2024-03-23 (×4): qty 1

## 2024-03-23 NOTE — Plan of Care (Signed)
Progressing appropriately.

## 2024-03-23 NOTE — Group Note (Signed)
 Date:  03/23/2024 Time:  1:20 PM  Group Topic/Focus:  Overcoming Stress:   The focus of this group is to define stress and help patients assess their triggers.    Participation Level:  Did Not Attend  Participation Quality:    Affect:    Cognitive:    Insight:   Engagement in Group:    Modes of Intervention:    Additional Comments:    Katiria Calame 03/23/2024, 1:20 PM
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
## 2024-03-23 NOTE — Group Note (Signed)
 Recreation Therapy Group Note   Group Topic:Other  Group Date: 03/23/2024 Start Time: 1100 End Time: 1130 Facilitators: Deatrice Factor, LRT, CTRS   Recreation Therapy group was not held due to high acuity, safety of the patients on the unit, as well as minimal interest. LRT made individual rounds based upon recommendation of the treatment team.    Plan: Continue to engage patient in RT group sessions 2-3x/week.   [address] LRT, CTRS 03/23/2024 1:18 PM

## 2024-03-23 NOTE — Progress Notes (Signed)
   03/23/24 1100  Psych Admission Type (Psych Patients Only)  Admission Status Involuntary  Psychosocial Assessment  Patient Complaints None  Eye Contact Fair  Facial Expression Flat  Affect Appropriate to circumstance  Speech Soft  Interaction Assertive  Motor Activity Unsteady  Appearance/Hygiene In scrubs;Poor hygiene  Behavior Characteristics Calm  Mood Irritable  Thought Process  Coherency Disorganized  Content Preoccupation  Delusions None reported or observed  Perception UTA  Hallucination UTA  Judgment Impaired  Confusion WDL  Danger to Self  Current suicidal ideation? Denies  Danger to Others  Danger to Others None reported or observed

## 2024-03-23 NOTE — Group Note (Signed)
 Recreation Therapy Group Note   Group Topic:Emotion Expression  Group Date: 03/23/2024 Start Time: 1500 End Time: 1540 Facilitators: Deatrice Factor, LRT, CTRS Location: Dayroom  Group Description: Positive Focus. Patients are given a handout that has 9 different boxes on it. Each box has a different prompt on it that requires you to identify and add something positive about themselves. LRT encourages pts to share at least two of their boxes to the group. LRT and pts discuss the importance of "thinking positive", self-esteem and how they can apply it to their everyday life post-discharge.  Goal Area(s) Addressed: Patient will identify positive qualities about themselves. Patient will identify definition of "self-esteem". Patients will identify the difference between positive and negative self-esteem.  Patient will recite positive qualities and affirmations aloud to the group.   Affect/Mood: N/A   Participation Level: Did not attend    Clinical Observations/Individualized Feedback: Patient did not attend group.   Plan: Continue to engage patient in RT group sessions 2-3x/week.   Deatrice Factor, LRT, CTRS 03/23/2024 4:29 PM

## 2024-03-23 NOTE — Group Note (Signed)
 Date:  03/23/2024 Time:  11:23 PM  Group Topic/Focus:  Wrap-Up Group:   The focus of this group is to help patients review their daily goal of treatment and discuss progress on daily workbooks.    Participation Level:  Minimal  Participation Quality:  Attentive  Affect:  Appropriate  Cognitive:  Oriented  Insight: Improving  Engagement in Group:  Improving  Modes of Intervention:  Discussion  Additional Comments:    Rolland Cline 03/23/2024, 11:23 PM
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
## 2024-03-23 NOTE — Progress Notes (Signed)
 Devin [name] c/o a headache and this nurse contacted the provider to get a PRN Tylenol  ordered for him. Pt took medication and reported that it was effective. Pt participatory in the milieu.

## 2024-03-23 NOTE — Plan of Care (Signed)
   Problem: Education: Goal: Knowledge of [facility] Education information/materials will improve Outcome: Progressing Goal: Emotional status will improve Outcome: Progressing Goal: Mental status will improve Outcome: Progressing Goal: Verbalization of understanding the information provided will improve Outcome: Progressing

## 2024-03-24 ENCOUNTER — Inpatient Hospital Stay

## 2024-03-24 DIAGNOSIS — F29 Unspecified psychosis not due to a substance or known physiological condition: Secondary | ICD-10-CM

## 2024-03-24 MED ORDER — OLANZAPINE 5 MG PO TABS
10.0000 mg | ORAL_TABLET | Freq: Every day | ORAL | Status: DC
  Administered 2024-03-24 – 2024-03-25 (×2): 10 mg via ORAL
  Filled 2024-03-24 (×2): qty 2

## 2024-03-24 NOTE — Group Note (Signed)
 Date:  03/24/2024 Time:  8:40 PM  Group Topic/Focus:  Self Care:   The focus of this group is to help patients understand the importance of self-care in order to improve or restore emotional, physical, spiritual, interpersonal, and financial health. Wrap-Up Group:   The focus of this group is to help patients review their daily goal of treatment and discuss progress on daily workbooks.    Participation Level:  Active  Participation Quality:  Appropriate  Affect:  Appropriate  Cognitive:  Appropriate  Insight: Good  Engagement in Group:  Engaged  Modes of Intervention:  Discussion  Additional Comments:    Joann Mu 03/24/2024, 8:40 PM

## 2024-03-24 NOTE — [facility] Suicide Risk Assessment (Signed)
 [facility] INPATIENT:  Family/Significant Other Suicide Prevention Education  Suicide Prevention Education:  Patient Refusal for Family/Significant Other Suicide Prevention Education: The patient Devin [name] has refused to provide written consent for family/significant other to be provided Family/Significant Other Suicide Prevention Education during admission and/or prior to discharge.  Physician notified.  Claudio Culver 03/24/2024, 3:22 PM

## 2024-03-24 NOTE — Plan of Care (Signed)
 progressing

## 2024-03-24 NOTE — BH IP Treatment Plan (Signed)
 Interdisciplinary Treatment and Diagnostic Plan Update  03/24/2024 Time of Session: 11:20 Devin [name] MRN: [id number]  Principal Diagnosis: Psychosis (HCC)  Secondary Diagnoses: Principal Problem:   Psychosis (HCC)   Current Medications:  Current Facility-Administered Medications  Medication Dose Route Frequency Provider Last Rate Last Admin   acetaminophen  (TYLENOL ) tablet 650 mg  650 mg Oral Q6H PRN McLauchlin, Shelvy Dickens, NP   650 mg at 03/22/24 2346   alum & mag hydroxide-simeth (MAALOX/MYLANTA) 200-200-20 MG/5ML suspension 30 mL  30 mL Oral Q4H PRN Gilman Lade, NP       apixaban  (ELIQUIS ) tablet 5 mg  5 mg Oral BID Jadapalle, Sree, MD   5 mg at 03/24/24 1010   bictegravir-emtricitabine -tenofovir  AF (BIKTARVY ) 50-200-25 MG per tablet 1 tablet  1 tablet Oral Daily Jadapalle, Sree, MD   1 tablet at 03/24/24 1010   carvedilol  (COREG ) tablet 3.125 mg  3.125 mg Oral BID WC Jadapalle, Sree, MD   3.125 mg at 03/24/24 1010   dapagliflozin  propanediol (FARXIGA ) tablet 10 mg  10 mg Oral Daily Jadapalle, Sree, MD   10 mg at 03/24/24 1010   digoxin  (LANOXIN ) tablet 0.125 mg  0.125 mg Oral Daily Jadapalle, Sree, MD   0.125 mg at 03/24/24 1010   DULoxetine  (CYMBALTA ) DR capsule 60 mg  60 mg Oral Daily Jadapalle, Sree, MD   60 mg at 03/24/24 1010   magnesium  hydroxide (MILK OF MAGNESIA) suspension 30 mL  30 mL Oral Daily PRN Gilman Lade, NP       naproxen (NAPROSYN) tablet 250 mg  250 mg Oral TID WC Jadapalle, Sree, MD   250 mg at 03/24/24 1030   OLANZapine  (ZYPREXA ) tablet 5 mg  5 mg Oral QHS Jadapalle, Sree, MD   5 mg at 03/24/24 7902   OLANZapine  zydis (ZYPREXA ) disintegrating tablet 5 mg  5 mg Oral TID PRN Byungura, Veronique M, NP       pantoprazole  (PROTONIX ) EC tablet 40 mg  40 mg Oral Daily Jadapalle, Sree, MD   40 mg at 03/24/24 1026   sacubitril -valsartan  (ENTRESTO ) 24-26 mg per tablet  1 tablet Oral BID Jadapalle, Sree, MD   1 tablet at 03/23/24 2208   PTA  Medications: Medications Prior to Admission  Medication Sig Dispense Refill Last Dose/Taking   acetaminophen  (TYLENOL ) 325 MG tablet Take 2 tablets (650 mg total) by mouth every 4 (four) hours as needed for headache or mild pain (pain score 1-3).      apixaban  (ELIQUIS ) 5 MG TABS tablet Take 1 tablet (5 mg total) by mouth 2 (two) times daily. 60 tablet 0    bictegravir-emtricitabine -tenofovir  AF (BIKTARVY ) 50-200-25 MG TABS tablet Take 1 tablet by mouth daily. 30 tablet 0    carvedilol  (COREG ) 3.125 MG tablet Take 1 tablet (3.125 mg total) by mouth 2 (two) times daily with a meal. 60 tablet 0    dapagliflozin  propanediol (FARXIGA ) 10 MG TABS tablet Take 1 tablet (10 mg total) by mouth daily. 30 tablet 0    digoxin  (LANOXIN ) 0.125 MG tablet Take 1 tablet (0.125 mg total) by mouth daily. 30 tablet 0    DULoxetine  (CYMBALTA ) 60 MG capsule Take 1 capsule (60 mg total) by mouth daily. 30 capsule 0    LORazepam  (ATIVAN ) 1 MG tablet Take 1 tablet (1 mg total) by mouth every 8 (eight) hours as needed for anxiety. 10 tablet 0    pantoprazole  (PROTONIX ) 40 MG tablet Take 1 tablet (40 mg total) by mouth daily. 30 tablet 0  sacubitril -valsartan  (ENTRESTO ) 24-26 MG Take 1 tablet by mouth 2 (two) times daily. 60 tablet 0    spironolactone  (ALDACTONE ) 25 MG tablet Take 0.5 tablets (12.5 mg total) by mouth daily. 15 tablet 0     Patient Stressors: Health problems   Substance abuse    Patient Strengths: Communication skills   Treatment Modalities: Medication Management, Group therapy, Case management,  1 to 1 session with clinician, Psychoeducation, Recreational therapy.   Physician Treatment Plan for Primary Diagnosis: Psychosis (HCC) Long Term Goal(s): Improvement in symptoms so as ready for discharge   Short Term Goals: Ability to identify changes in lifestyle to reduce recurrence of condition will improve Ability to verbalize feelings will improve Ability to disclose and discuss suicidal  ideas Ability to demonstrate self-control will improve Ability to identify triggers associated with substance abuse/mental health issues will improve Ability to identify and develop effective coping behaviors will improve  Medication Management: Evaluate patient's response, side effects, and tolerance of medication regimen.  Therapeutic Interventions: 1 to 1 sessions, Unit Group sessions and Medication administration.  Evaluation of Outcomes: Not Progressing  Physician Treatment Plan for Secondary Diagnosis: Principal Problem:   Psychosis (HCC)  Long Term Goal(s): Improvement in symptoms so as ready for discharge   Short Term Goals: Ability to identify changes in lifestyle to reduce recurrence of condition will improve Ability to verbalize feelings will improve Ability to disclose and discuss suicidal ideas Ability to demonstrate self-control will improve Ability to identify triggers associated with substance abuse/mental health issues will improve Ability to identify and develop effective coping behaviors will improve     Medication Management: Evaluate patient's response, side effects, and tolerance of medication regimen.  Therapeutic Interventions: 1 to 1 sessions, Unit Group sessions and Medication administration.  Evaluation of Outcomes: Not Progressing   RN Treatment Plan for Primary Diagnosis: Psychosis (HCC) Long Term Goal(s): Knowledge of disease and therapeutic regimen to maintain health will improve  Short Term Goals: Ability to remain free from injury will improve, Ability to verbalize frustration and anger appropriately will improve, Ability to demonstrate self-control, Ability to participate in decision making will improve, Ability to verbalize feelings will improve, Ability to disclose and discuss suicidal ideas, Ability to identify and develop effective coping behaviors will improve, and Compliance with prescribed medications will improve  Medication Management: RN  will administer medications as ordered by provider, will assess and evaluate patient's response and provide education to patient for prescribed medication. RN will report any adverse and/or side effects to prescribing provider.  Therapeutic Interventions: 1 on 1 counseling sessions, Psychoeducation, Medication administration, Evaluate responses to treatment, Monitor vital signs and CBGs as ordered, Perform/monitor CIWA, COWS, AIMS and Fall Risk screenings as ordered, Perform wound care treatments as ordered.  Evaluation of Outcomes: Not Progressing   LCSW Treatment Plan for Primary Diagnosis: Psychosis (HCC) Long Term Goal(s): Safe transition to appropriate next level of care at discharge, Engage patient in therapeutic group addressing interpersonal concerns.  Short Term Goals: Engage patient in aftercare planning with referrals and resources, Increase social support, Increase ability to appropriately verbalize feelings, Increase emotional regulation, Facilitate acceptance of mental health diagnosis and concerns, Facilitate patient progression through stages of change regarding substance use diagnoses and concerns, Identify triggers associated with mental health/substance abuse issues, and Increase skills for wellness and recovery  Therapeutic Interventions: Assess for all discharge needs, 1 to 1 time with Social worker, Explore available resources and support systems, Assess for adequacy in community support network, Educate family and significant other(s)  on suicide prevention, Complete Psychosocial Assessment, Interpersonal group therapy.  Evaluation of Outcomes: Not Progressing   Progress in Treatment: Attending groups: No. Participating in groups: No. Taking medication as prescribed: Yes. Toleration medication: Yes. Family/Significant other contact made: No, will contact:  CSW will contact if given permission Patient understands diagnosis: Yes. Discussing patient identified  problems/goals with staff: Yes. Medical problems stabilized or resolved: Yes. Denies suicidal/homicidal ideation: Yes. Issues/concerns per patient self-inventory: No. Other: None   New problem(s) identified: No, Describe:  None identified   New Short Term/Long Term Goal(s): detox, elimination of symptoms of psychosis, medication management for mood stabilization; elimination of SI thoughts; development of comprehensive mental wellness/sobriety plan.   Patient Goals:  "All across the board, I would like to imrpove everything"   Discharge Plan or Barriers: CSW will assist with appropriate discharge planning   Reason for Continuation of Hospitalization: Depression Medication stabilization Withdrawal symptoms  Estimated Length of Stay: 1 to 7 days   Last 3 [place] Suicide Severity Risk Score: Flowsheet Row Admission (Current) from 03/22/2024 in [facility] ED from 03/21/2024 in [facility] Emergency Department at [facility] ED to Hosp-Admission (Discharged) from 03/04/2024 in [facility] 3E HF PCU  C-SSRS RISK CATEGORY No Risk No Risk No Risk       Last PHQ 2/9 Scores:    04/04/2022    3:06 PM 05/17/2021   10:33 AM 05/08/2021    3:15 PM  Depression screen PHQ 2/9  Decreased Interest 0 3 0  Down, Depressed, Hopeless 1 3 1   PHQ - 2 Score 1 6 1   Altered sleeping  3   Tired, decreased energy  3   Change in appetite  3   Feeling bad or failure about yourself   3   Trouble concentrating  3   Moving slowly or fidgety/restless  3   Suicidal thoughts  1   PHQ-9 Score  25   Difficult doing work/chores  Very difficult     Scribe for Treatment Team: Claudio Culver, Milinda Allen 03/24/2024 1:51 PM

## 2024-03-24 NOTE — Progress Notes (Signed)
 Devin [name] is medication compliant and voices no complaints this shift to this nurse. He is currently resting in bed with respirations even and unlabored.

## 2024-03-24 NOTE — Plan of Care (Signed)
  Problem: Self-Concept: Goal: Ability to identify factors that promote anxiety will improve Outcome: Progressing Goal: Level of anxiety will decrease Outcome: Progressing Goal: Ability to modify response to factors that promote anxiety will improve Outcome: Progressing   Problem: Coping: Goal: Ability to identify and develop effective coping behavior will improve Outcome: Progressing
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
## 2024-03-24 NOTE — Progress Notes (Signed)
   03/24/24 1400  Psych Admission Type (Psych Patients Only)  Admission Status Involuntary  Psychosocial Assessment  Patient Complaints None  Eye Contact Brief  Facial Expression Worried  Affect Angry;Depressed  Speech Argumentative  Interaction Assertive  Motor Activity Unsteady  Appearance/Hygiene In scrubs;Poor hygiene  Behavior Characteristics Irritable  Mood Irritable  Thought Process  Coherency Disorganized  Content Preoccupation  Delusions None reported or observed  Perception WDL  Hallucination None reported or observed  Judgment Impaired  Confusion WDL  Danger to Self  Current suicidal ideation? Denies  Danger to Others  Danger to Others None reported or observed

## 2024-03-25 NOTE — Group Note (Unsigned)
 Date:  03/25/2024 Time:  8:58 PM  Group Topic/Focus:  Making Healthy Choices:   The focus of this group is to help patients identify negative/unhealthy choices they were using prior to admission and identify positive/healthier coping strategies to replace them upon discharge.     Participation Level:  {BHH PARTICIPATION WUJWJ:19147}  Participation Quality:  {BHH PARTICIPATION QUALITY:22265}  Affect:  {BHH AFFECT:22266}  Cognitive:  {BHH COGNITIVE:22267}  Insight: {BHH Insight2:20797}  Engagement in Group:  {BHH ENGAGEMENT IN WGNFA:21308}  Modes of Intervention:  {BHH MODES OF INTERVENTION:22269}  Additional Comments:  ***  Krystine Pabst 03/25/2024, 8:58 PM

## 2024-03-25 NOTE — Progress Notes (Signed)
   03/25/24 0601  15 Minute Checks  Location Bedroom  Visual Appearance Calm  Behavior Sleeping  Sleep (Behavioral Health Patients Only)  Calculate sleep? (Click Yes once per 24 hr at 0600 safety check) Yes  Documented sleep last 24 hours 12.25

## 2024-03-25 NOTE — Progress Notes (Signed)
 Patient presents as argumentative and demanding.  Flat affect.  Endorses depression. Denies anxiety,  SI/HI and AVH.   Reports 7/10 pain in right shoulder.    Compliant with scheduled medications.  15 min checks in place for safety.  Patient has isolated to room - refusing to come to the dayroom unless pushed by a staff member.  Dr. Kirk Peper made aware.    Patient did ambulate to dayroom for dinner with assistance.

## 2024-03-25 NOTE — Plan of Care (Signed)
  Problem: Activity: Goal: Interest or engagement in activities will improve Outcome: Not Progressing   Problem: Coping: Goal: Ability to demonstrate self-control will improve Outcome: Not Progressing   
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
## 2024-03-25 NOTE — Progress Notes (Deleted)
   03/25/24 0100  Psych Admission Type (Psych Patients Only)  Admission Status Involuntary  Psychosocial Assessment  Patient Complaints Anxiety  Eye Contact Brief  Facial Expression Flat  Affect Flat  Speech Logical/coherent  Interaction Assertive  Motor Activity Other (Comment) (wheelchair)  Appearance/Hygiene In scrubs  Behavior Characteristics Cooperative;Appropriate to situation  Mood Pleasant;Anxious  Thought Process  Coherency Disorganized  Content Preoccupation  Delusions None reported or observed  Perception WDL  Hallucination None reported or observed  Judgment Impaired  Confusion None  Danger to Self  Current suicidal ideation? Denies  Danger to Others  Danger to Others None reported or observed

## 2024-03-25 NOTE — Group Note (Signed)
 Recreation Therapy Group Note   Group Topic:Emotion Expression  Group Date: 03/25/2024 Start Time: 1500 End Time: 1545 Facilitators: Deatrice Factor, LRT, CTRS Location: Courtyard  Group Description: Music. Patients encouraged to name their favorite song(s) for LRT to play song through speaker for group to hear. Patient educated on the definition of leisure and the importance of having different leisure interests outside of the hospital. Group discussed how leisure activities can often be used as Pharmacologist and that listening to music is one example. Patients also assisted in watering the raised garden beds in the courtyard.   Goal Area(s) Addressed:  Patient will identify a current leisure interest.  Patient will practice making a positive decision. Patient will have the opportunity to try a new leisure activity.   Affect/Mood: N/A   Participation Level: Did not attend    Clinical Observations/Individualized Feedback: Patient did not attend group.   Plan: Continue to engage patient in RT group sessions 2-3x/week.   Deatrice Factor, LRT, CTRS 03/25/2024 5:09 PM

## 2024-03-25 NOTE — Progress Notes (Signed)
 Pt complained of dizziness and headache.  This Clinical research associate encouraged pt to go to dayroom for meal after sitting on edge of bed before standing, use wheelchair for safety as headache may be related to dehydration or low blood sugar as he hasn't been out of bed today.  Pt initially refused then this writer witnessed him ambulating down hallway.  This Clinical research associate came along side him with one hands on assist and pt was brought safely to sit in dayroom and his tray with an additional water was placed in front of him and he began to eat and drink.

## 2024-03-25 NOTE — Group Note (Signed)
 Date:  03/25/2024 Time:  10:09 AM  Group Topic/Focus:  House rules and Expitations    Participation Level:  Did Not Attend   Merton Abts 03/25/2024, 10:09 AM

## 2024-03-25 NOTE — Group Note (Signed)
 Date:  03/25/2024 Time:  9:57 PM  Group Topic/Focus:  Self Care:   The focus of this group is to help patients understand the importance of self-care in order to improve or restore emotional, physical, spiritual, interpersonal, and financial health.    Participation Level:  Active  Participation Quality:  Appropriate  Affect:  Appropriate  Cognitive:  Appropriate  Insight: Appropriate  Engagement in Group:  Engaged  Modes of Intervention:  Discussion  Additional Comments:    Sherlie Distance 03/25/2024, 9:57 PM

## 2024-03-25 NOTE — Progress Notes (Signed)
   03/25/24 0100  Psych Admission Type (Psych Patients Only)  Admission Status Involuntary  Psychosocial Assessment  Patient Complaints Anxiety  Eye Contact Brief  Facial Expression Flat  Affect Flat  Speech Logical/coherent  Interaction Assertive  Motor Activity Other (Comment) (wheelchair)  Appearance/Hygiene In scrubs  Behavior Characteristics Cooperative;Appropriate to situation  Mood Pleasant;Anxious  Thought Process  Coherency Disorganized  Content Preoccupation  Delusions Paranoid  Perception WDL  Hallucination Auditory  Judgment Impaired  Confusion None  Danger to Self  Current suicidal ideation? Denies  Danger to Others  Danger to Others None reported or observed

## 2024-03-26 DIAGNOSIS — F29 Unspecified psychosis not due to a substance or known physiological condition: Secondary | ICD-10-CM | POA: Diagnosis not present

## 2024-03-26 MED ORDER — DULOXETINE HCL 60 MG PO CPEP: 60.0000 mg | ORAL_CAPSULE | Freq: Every day | ORAL | 0 refills | Status: DC

## 2024-03-26 MED ORDER — SACUBITRIL-VALSARTAN 24-26 MG PO TABS: 1.0000 | ORAL_TABLET | Freq: Two times a day (BID) | ORAL | 0 refills | Status: DC

## 2024-03-26 MED ORDER — OLANZAPINE 10 MG PO TABS: 10.0000 mg | ORAL_TABLET | Freq: Every day | ORAL | 0 refills | Status: DC

## 2024-03-26 MED ORDER — BICTEGRAVIR-EMTRICITAB-TENOFOV 50-200-25 MG PO TABS: 1.0000 | ORAL_TABLET | Freq: Every day | ORAL | 0 refills | Status: DC

## 2024-03-26 MED ORDER — DAPAGLIFLOZIN PROPANEDIOL 10 MG PO TABS: 10.0000 mg | ORAL_TABLET | Freq: Every day | ORAL | 0 refills | Status: DC

## 2024-03-26 NOTE — Progress Notes (Signed)
 Pt is alert and oriented. Needy and demanding. Amb ad lib. Limited interaction with peers and staff. Did not attend group. Po med compliant. No behavior issues noted. Denies SI/HI/AVH. No c/o pain/discomfort noted.
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
## 2024-03-26 NOTE — Plan of Care (Signed)
  Problem: Education: Goal: Knowledge of [facility] Education information/materials will improve Outcome: Progressing Goal: Mental status will improve Outcome: Progressing   Problem: Activity: Goal: Interest or engagement in activities will improve Outcome: Not Progressing

## 2024-03-26 NOTE — Progress Notes (Signed)
  [facility] Adult Case Management Discharge Plan :  Will you be returning to the same living situation after discharge:  Yes,  pt will return home At discharge, do you have transportation home?: Yes,  CSW will assist with transportation  Do you have the ability to pay for your medications: Yes,  [employer] MEDICARE / [place] DUAL COMPLETE  Release of information consent forms completed and in the chart;  Patient's signature needed at discharge.  Patient to Follow up at:  Follow-up Information     [facility] Follow up.   Why: Althoughm you declined scheduled follow--up, I am putting walk-in hours for [facility].  If you're a walk-in patient there is generally a wait time involved on a "first come, first served basis" otherwise contact us  beforehand to setup an appointment. If you're in crisis please use our 24-Hour Crisis Hotline for immediate access to a clinician.  If this is your first time please bring the following with you: Contact information: [address] Dominick F[place] [address] [phone]                 Next level of care provider has access to [facility] Link:no  Safety Planning and Suicide Prevention discussed: Yes,  CSW went over SPE with pt      Has patient been referred to the Quitline?: Patient refused referral for treatment  Patient has been referred for addiction treatment: Patient refused referral for treatment.  [address] LCSWA 03/26/2024, 2:55 PM

## 2024-03-26 NOTE — Plan of Care (Signed)

## 2024-03-26 NOTE — Group Note (Signed)
 Physical/Occupational Therapy Group Note  Group Topic: Neurographic Art  Group Date: 03/26/2024 Start Time: 1305 End Time: 1345 Facilitators: Hilma Lucks, OT   Group Description: Group participated with Neurographic art activity, using watercolor paints to facilitate creative expression and meditation/relaxation for each individual.  Incorporated bimanual coordination, mental focus, emotional processing, task/command following and relaxation techniques as appropriate.  Patients engaged socially with therapist and other group participants throughout session. Allowed to ask questions as appropriate, and encouraged to identify ways they could use/share their creations with themselves and others.  Therapeutic Goal(s):  Demonstrate ability to independently manipulate utensils required to participate with and complete activity. Demonstrate ability to cognitively focus on task and follow commands necessary for completion. Demonstrate use of art as an outlet for emotional processing and expression. Identify and demonstrate importance of relaxation, neural calming and meditation for improved participation with life groups.  Individual Participation: Pt did not attend.  Participation Level: Did not attend   Participation Quality:   Behavior:   Speech/Thought Process:   Affect/Mood:   Insight:   Judgement:   Modes of Intervention:   Patient Response to Interventions:    Plan: Continue to engage patient in PT/OT groups 1 - 2x/week.  Kabeer Hoagland R., MPH, MS, OTR/L ascom [phone] 03/26/24, 2:30 PM

## 2024-03-26 NOTE — Progress Notes (Signed)
   03/26/24 0615  15 Minute Checks  Location Bedroom  Visual Appearance Calm  Behavior Sleeping  Sleep (Behavioral Health Patients Only)  Calculate sleep? (Click Yes once per 24 hr at 0600 safety check) Yes  Documented sleep last 24 hours 18.25

## 2024-03-26 NOTE — Progress Notes (Signed)
 Patient is pleasant and cooperative.  Sad affect.  Endorses anxiety and depression. Denies SI/HI and AVH.  Reports pain 8/10 in lower back. Scheduled pain medication given as ordered. Patient reports poor sleep.  Compliant with scheduled medications.  15 min checks in place for safety.  Patient present in the dayroom for lunch. Patient ambulted independently to dayroom.  Appropriate interaction with peers and staff.

## 2024-03-26 NOTE — Progress Notes (Signed)
   03/25/24 2200  Psych Admission Type (Psych Patients Only)  Admission Status Involuntary  Psychosocial Assessment  Patient Complaints Depression  Eye Contact Brief  Facial Expression Flat  Affect Flat  Speech Logical/coherent  Interaction Assertive  Motor Activity Slow  Appearance/Hygiene In scrubs  Behavior Characteristics Irritable  Mood Anxious  Thought Process  Coherency Disorganized  Content Preoccupation  Delusions Somatic  Perception WDL  Hallucination None reported or observed  Judgment Impaired  Confusion Mild  Danger to Self  Current suicidal ideation? Denies  Danger to Others  Danger to Others None reported or observed

## 2024-03-26 NOTE — Progress Notes (Signed)
 Discharge Note:  Patient denies SI/HI/AVH at this time. Discharge instructions, AVS, prescriptions, and transition record reviewed with patient.  Patient agrees to comply with medication management and  follow-up visit.   Patient belongings returned to patient.  Patient questions and concerns addressed and answered.   Patient ambulatory off unit.  Patient discharged home with cab voucher  Patient declined to complete Suicide Safety Plan and patient survey.

## 2024-03-26 NOTE — BHH Suicide Risk Assessment (Signed)
 [facility] Discharge Suicide Risk Assessment   Principal Problem: Psychosis [facility]) Discharge Diagnoses: Principal Problem:   Psychosis (HCC)   Total Time spent with patient: 30 minutes  Musculoskeletal: Strength & Muscle Tone: within normal limits Gait & Station: normal Patient leans: N/A  Psychiatric Specialty Exam  Presentation  General Appearance:  Appropriate for Environment; Casual  Eye Contact: Fair  Speech: Normal Rate  Speech Volume: Normal  Handedness: Right   Mood and Affect  Mood: Euthymic  Duration of Depression Symptoms: Greater than two weeks  Affect: Appropriate   Thought Process  Thought Processes: Coherent  Descriptions of Associations:Intact  Orientation:Full (Time, Place and Person)  Thought Content:Logical  History of Schizophrenia/Schizoaffective disorder:No  Duration of Psychotic Symptoms:Less than six months  Hallucinations:Hallucinations: None  Ideas of Reference:None  Suicidal Thoughts:Suicidal Thoughts: No  Homicidal Thoughts:Homicidal Thoughts: No   Sensorium  Memory: Immediate Fair; Recent Fair; Remote Fair  Judgment: Fair  Insight: Fair   Art therapist  Concentration: Fair  Attention Span: Fair  Recall: [employer] of Knowledge: Fair  Language: Fair   Psychomotor Activity  Psychomotor Activity: Psychomotor Activity: Normal   Assets  Assets: Communication Skills; Desire for Improvement; Housing; Resilience   Sleep  Sleep: Sleep: Fair   Physical Exam: Physical Exam Vitals and nursing note reviewed.    ROS Blood pressure 115/81, pulse 96, temperature 98.1 F (36.7 C), resp. rate 17, height 6' (1.829 m), weight 79.4 kg, SpO2 96%. Body mass index is 23.73 kg/m.  Mental Status Per Nursing Assessment::   On Admission:  NA  Demographic Factors:  Male and Caucasian  Loss Factors: Decrease in vocational status  Historical Factors: Impulsivity  Risk Reduction Factors:    Positive social support, Positive therapeutic relationship, and Positive coping skills or problem solving skills  Continued Clinical Symptoms:  Alcohol/Substance Abuse/Dependencies  Cognitive Features That Contribute To Risk:  None    Suicide Risk:  Minimal: No identifiable suicidal ideation.  Patients presenting with no risk factors but with morbid ruminations; may be classified as minimal risk based on the severity of the depressive symptoms   Follow-up Information     [facility] Follow up.   Why: Althoughm you declined scheduled follow--up, I am putting walk-in hours for [facility].  If you're a walk-in patient there is generally a wait time involved on a "first come, first served basis" otherwise contact us  beforehand to setup an appointment. If you're in crisis please use our 24-Hour Crisis Hotline for immediate access to a clinician.  If this is your first time please bring the following with you: Contact information: [address] [place] [address] [phone]                 Plan Of Care/Follow-up recommendations:  Activity:  As tolerated  Aurelia Blotter, MD 03/26/2024, 3:32 PM

## 2024-03-26 NOTE — Group Note (Signed)
 Date:  03/26/2024 Time:  10:54 AM  Group Topic/Focus:  Wellness Toolbox:   The focus of this group is to discuss various aspects of wellness, balancing those aspects and exploring ways to increase the ability to experience wellness.  Patients will create a wellness toolbox for use upon discharge.    Participation Level:  Did Not Attend   Devin [name] 03/26/2024, 10:54 AM

## 2024-04-19 ENCOUNTER — Telehealth: Payer: Self-pay | Admitting: *Deleted

## 2024-04-19 NOTE — Telephone Encounter (Signed)
 Two attempts were made to inform patient of appointment cancelled for 6/24 with Dr. Eben.

## 2024-04-20 ENCOUNTER — Encounter: Admitting: Infectious Diseases

## 2024-04-21 ENCOUNTER — Encounter: Admitting: Infectious Diseases

## 2024-04-27 ENCOUNTER — Encounter: Admitting: Physician Assistant

## 2024-06-25 ENCOUNTER — Other Ambulatory Visit (HOSPITAL_COMMUNITY): Payer: Self-pay | Admitting: Cardiology

## 2024-06-25 MED ORDER — DAPAGLIFLOZIN PROPANEDIOL 10 MG PO TABS: 10.0000 mg | ORAL_TABLET | Freq: Every day | ORAL | 0 refills | Status: DC

## 2024-06-25 MED ORDER — DIGOXIN 125 MCG PO TABS: 0.1250 mg | ORAL_TABLET | Freq: Every day | ORAL | 0 refills | Status: DC

## 2024-06-25 MED ORDER — SPIRONOLACTONE 25 MG PO TABS: 12.5000 mg | ORAL_TABLET | Freq: Every day | ORAL | 0 refills | Status: DC

## 2024-06-25 MED ORDER — CARVEDILOL 3.125 MG PO TABS: 3.1250 mg | ORAL_TABLET | Freq: Two times a day (BID) | ORAL | 0 refills | Status: DC

## 2024-06-25 MED ORDER — SACUBITRIL-VALSARTAN 24-26 MG PO TABS: 1.0000 | ORAL_TABLET | Freq: Two times a day (BID) | ORAL | 0 refills | Status: DC

## 2024-06-25 MED ORDER — APIXABAN 5 MG PO TABS: 5.0000 mg | ORAL_TABLET | Freq: Two times a day (BID) | ORAL | 0 refills | Status: DC

## 2024-06-30 ENCOUNTER — Telehealth (HOSPITAL_COMMUNITY): Payer: Self-pay

## 2024-06-30 NOTE — Telephone Encounter (Signed)
 Called to confirm/remind patient of [facility] on [facility] 11:30.   Appointment:   [] Confirmed  [] Left mess   [] No answer/No voice mail  [] VM Full/unable to leave message  [x] Phone not in service  Patient reminded to bring all medications and/or complete list.  Confirmed patient has transportation. Gave directions, instructed to utilize valet parking.

## 2024-07-01 ENCOUNTER — Encounter (HOSPITAL_COMMUNITY)

## 2024-07-15 ENCOUNTER — Telehealth (HOSPITAL_COMMUNITY): Payer: Self-pay | Admitting: *Deleted

## 2024-07-15 MED ORDER — APIXABAN 5 MG PO TABS: 5.0000 mg | ORAL_TABLET | Freq: Two times a day (BID) | ORAL | 0 refills | Status: DC

## 2024-07-15 MED ORDER — SPIRONOLACTONE 25 MG PO TABS: 12.5000 mg | ORAL_TABLET | Freq: Every day | ORAL | 0 refills | Status: DC

## 2024-07-15 MED ORDER — SACUBITRIL-VALSARTAN 24-26 MG PO TABS: 1.0000 | ORAL_TABLET | Freq: Two times a day (BID) | ORAL | 0 refills | Status: DC

## 2024-07-15 MED ORDER — CARVEDILOL 3.125 MG PO TABS: 3.1250 mg | ORAL_TABLET | Freq: Two times a day (BID) | ORAL | 0 refills | Status: AC

## 2024-07-15 MED ORDER — DIGOXIN 125 MCG PO TABS: 0.1250 mg | ORAL_TABLET | Freq: Every day | ORAL | 0 refills | Status: DC

## 2024-07-15 MED ORDER — DAPAGLIFLOZIN PROPANEDIOL 10 MG PO TABS: 10.0000 mg | ORAL_TABLET | Freq: Every day | ORAL | 0 refills | Status: DC
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
## 2024-07-23 ENCOUNTER — Emergency Department (HOSPITAL_COMMUNITY): Payer: Self-pay

## 2024-07-23 ENCOUNTER — Inpatient Hospital Stay (HOSPITAL_COMMUNITY)
Admission: EM | Admit: 2024-07-23 | Discharge: 2024-08-03 | DRG: 082 | Disposition: A | Discharge status: Home / Self Care | Payer: Self-pay | Attending: Internal Medicine | Admitting: Internal Medicine

## 2024-07-23 DIAGNOSIS — Z21 Asymptomatic human immunodeficiency virus [HIV] infection status: Secondary | ICD-10-CM

## 2024-07-23 DIAGNOSIS — S0990XA Unspecified injury of head, initial encounter: Secondary | ICD-10-CM

## 2024-07-23 DIAGNOSIS — J449 Chronic obstructive pulmonary disease, unspecified: Secondary | ICD-10-CM

## 2024-07-23 DIAGNOSIS — I5043 Acute on chronic combined systolic (congestive) and diastolic (congestive) heart failure: Secondary | ICD-10-CM

## 2024-07-23 DIAGNOSIS — Z86711 Personal history of pulmonary embolism: Secondary | ICD-10-CM

## 2024-07-23 DIAGNOSIS — I5023 Acute on chronic systolic (congestive) heart failure: Secondary | ICD-10-CM | POA: Insufficient documentation

## 2024-07-23 DIAGNOSIS — F1910 Other psychoactive substance abuse, uncomplicated: Secondary | ICD-10-CM

## 2024-07-23 DIAGNOSIS — N179 Acute kidney failure, unspecified: Secondary | ICD-10-CM

## 2024-07-23 DIAGNOSIS — F14159 Cocaine abuse with cocaine-induced psychotic disorder, unspecified: Secondary | ICD-10-CM | POA: Diagnosis present

## 2024-07-23 DIAGNOSIS — F17210 Nicotine dependence, cigarettes, uncomplicated: Secondary | ICD-10-CM | POA: Diagnosis present

## 2024-07-23 DIAGNOSIS — E8809 Other disorders of plasma-protein metabolism, not elsewhere classified: Secondary | ICD-10-CM | POA: Diagnosis present

## 2024-07-23 DIAGNOSIS — J9601 Acute respiratory failure with hypoxia: Secondary | ICD-10-CM | POA: Diagnosis not present

## 2024-07-23 DIAGNOSIS — I5082 Biventricular heart failure: Secondary | ICD-10-CM | POA: Diagnosis present

## 2024-07-23 DIAGNOSIS — S065XAA Traumatic subdural hemorrhage with loss of consciousness status unknown, initial encounter: Secondary | ICD-10-CM | POA: Diagnosis not present

## 2024-07-23 DIAGNOSIS — D696 Thrombocytopenia, unspecified: Secondary | ICD-10-CM | POA: Diagnosis not present

## 2024-07-23 DIAGNOSIS — R404 Transient alteration of awareness: Secondary | ICD-10-CM | POA: Diagnosis not present

## 2024-07-23 DIAGNOSIS — I428 Other cardiomyopathies: Secondary | ICD-10-CM | POA: Diagnosis not present

## 2024-07-23 DIAGNOSIS — F15159 Other stimulant abuse with stimulant-induced psychotic disorder, unspecified: Secondary | ICD-10-CM | POA: Diagnosis present

## 2024-07-23 DIAGNOSIS — F32A Depression, unspecified: Secondary | ICD-10-CM | POA: Diagnosis present

## 2024-07-23 DIAGNOSIS — I83028 Varicose veins of left lower extremity with ulcer other part of lower leg: Secondary | ICD-10-CM | POA: Diagnosis present

## 2024-07-23 DIAGNOSIS — Z79899 Other long term (current) drug therapy: Secondary | ICD-10-CM

## 2024-07-23 DIAGNOSIS — R4182 Altered mental status, unspecified: Secondary | ICD-10-CM | POA: Diagnosis not present

## 2024-07-23 DIAGNOSIS — Y92488 Other paved roadways as the place of occurrence of the external cause: Secondary | ICD-10-CM

## 2024-07-23 DIAGNOSIS — E872 Acidosis, unspecified: Secondary | ICD-10-CM | POA: Diagnosis present

## 2024-07-23 DIAGNOSIS — L97919 Non-pressure chronic ulcer of unspecified part of right lower leg with unspecified severity: Secondary | ICD-10-CM | POA: Diagnosis not present

## 2024-07-23 DIAGNOSIS — Z7901 Long term (current) use of anticoagulants: Secondary | ICD-10-CM | POA: Diagnosis not present

## 2024-07-23 DIAGNOSIS — Z91148 Patient's other noncompliance with medication regimen for other reason: Secondary | ICD-10-CM

## 2024-07-23 DIAGNOSIS — R188 Other ascites: Secondary | ICD-10-CM | POA: Diagnosis present

## 2024-07-23 DIAGNOSIS — E11622 Type 2 diabetes mellitus with other skin ulcer: Secondary | ICD-10-CM | POA: Diagnosis not present

## 2024-07-23 DIAGNOSIS — K121 Other forms of stomatitis: Secondary | ICD-10-CM | POA: Diagnosis present

## 2024-07-23 DIAGNOSIS — S0993XA Unspecified injury of face, initial encounter: Secondary | ICD-10-CM | POA: Diagnosis not present

## 2024-07-23 DIAGNOSIS — Z7984 Long term (current) use of oral hypoglycemic drugs: Secondary | ICD-10-CM

## 2024-07-23 DIAGNOSIS — Z86718 Personal history of other venous thrombosis and embolism: Secondary | ICD-10-CM

## 2024-07-23 DIAGNOSIS — Z4682 Encounter for fitting and adjustment of non-vascular catheter: Secondary | ICD-10-CM | POA: Diagnosis not present

## 2024-07-23 DIAGNOSIS — L97929 Non-pressure chronic ulcer of unspecified part of left lower leg with unspecified severity: Secondary | ICD-10-CM | POA: Diagnosis not present

## 2024-07-23 DIAGNOSIS — I517 Cardiomegaly: Secondary | ICD-10-CM | POA: Diagnosis not present

## 2024-07-23 DIAGNOSIS — I2510 Atherosclerotic heart disease of native coronary artery without angina pectoris: Secondary | ICD-10-CM | POA: Diagnosis present

## 2024-07-23 DIAGNOSIS — E871 Hypo-osmolality and hyponatremia: Secondary | ICD-10-CM | POA: Diagnosis not present

## 2024-07-23 DIAGNOSIS — R000 Tachycardia, unspecified: Secondary | ICD-10-CM | POA: Diagnosis not present

## 2024-07-23 DIAGNOSIS — S3993XA Unspecified injury of pelvis, initial encounter: Secondary | ICD-10-CM | POA: Diagnosis not present

## 2024-07-23 DIAGNOSIS — I110 Hypertensive heart disease with heart failure: Secondary | ICD-10-CM | POA: Diagnosis present

## 2024-07-23 DIAGNOSIS — I83018 Varicose veins of right lower extremity with ulcer other part of lower leg: Secondary | ICD-10-CM | POA: Diagnosis not present

## 2024-07-23 DIAGNOSIS — S299XXA Unspecified injury of thorax, initial encounter: Secondary | ICD-10-CM | POA: Diagnosis not present

## 2024-07-23 DIAGNOSIS — R1111 Vomiting without nausea: Secondary | ICD-10-CM | POA: Diagnosis not present

## 2024-07-23 DIAGNOSIS — F40240 Claustrophobia: Secondary | ICD-10-CM | POA: Diagnosis present

## 2024-07-23 DIAGNOSIS — S065X0A Traumatic subdural hemorrhage without loss of consciousness, initial encounter: Secondary | ICD-10-CM | POA: Diagnosis not present

## 2024-07-23 LAB — I-STAT CHEM 8, ED
BUN: 28 mg/dL — ABNORMAL HIGH (ref 8–23)
Calcium, Ion: 0.95 mmol/L — ABNORMAL LOW (ref 1.15–1.40)
Chloride: 95 mmol/L — ABNORMAL LOW (ref 98–111)
Creatinine, Ser: 1.7 mg/dL — ABNORMAL HIGH (ref 0.61–1.24)
Glucose, Bld: 148 mg/dL — ABNORMAL HIGH (ref 70–99)
HCT: 48 % (ref 39.0–52.0)
Hemoglobin: 16.3 g/dL (ref 13.0–17.0)
Potassium: 3.7 mmol/L (ref 3.5–5.1)
Sodium: 132 mmol/L — ABNORMAL LOW (ref 135–145)
TCO2: 26 mmol/L (ref 22–32)

## 2024-07-23 LAB — COMPREHENSIVE METABOLIC PANEL WITH GFR
ALT: 206 U/L — ABNORMAL HIGH (ref 0–44)
AST: 83 U/L — ABNORMAL HIGH (ref 15–41)
Albumin: 2.7 g/dL — ABNORMAL LOW (ref 3.5–5.0)
Alkaline Phosphatase: 190 U/L — ABNORMAL HIGH (ref 38–126)
Anion gap: 11 (ref 5–15)
BUN: 25 mg/dL — ABNORMAL HIGH (ref 8–23)
CO2: 23 mmol/L (ref 22–32)
Calcium: 7.4 mg/dL — ABNORMAL LOW (ref 8.9–10.3)
Chloride: 95 mmol/L — ABNORMAL LOW (ref 98–111)
Creatinine, Ser: 1.6 mg/dL — ABNORMAL HIGH (ref 0.61–1.24)
GFR, Estimated: 49 mL/min — ABNORMAL LOW (ref >60)
Glucose, Bld: 148 mg/dL — ABNORMAL HIGH (ref 70–99)
Potassium: 3.8 mmol/L (ref 3.5–5.1)
Sodium: 129 mmol/L — ABNORMAL LOW (ref 135–145)
Total Bilirubin: 1.8 mg/dL — ABNORMAL HIGH (ref 0.0–1.2)
Total Protein: 6.7 g/dL (ref 6.5–8.1)

## 2024-07-23 LAB — CBC
HCT: 45.4 % (ref 39.0–52.0)
Hemoglobin: 13.6 g/dL (ref 13.0–17.0)
MCH: 29.3 pg (ref 26.0–34.0)
MCHC: 30 g/dL (ref 30.0–36.0)
MCV: 97.8 fL (ref 80.0–100.0)
Platelets: 107 K/uL — ABNORMAL LOW (ref 150–400)
RBC: 4.64 MIL/uL (ref 4.22–5.81)
RDW: 15.4 % (ref 11.5–15.5)
WBC: 8.8 K/uL (ref 4.0–10.5)
nRBC: 0.8 % — ABNORMAL HIGH (ref 0.0–0.2)

## 2024-07-23 LAB — SAMPLE TO BLOOD BANK

## 2024-07-23 LAB — ETHANOL: Alcohol, Ethyl (B): <15 mg/dL (ref <15)

## 2024-07-23 LAB — PROTIME-INR
INR: 1.4 — ABNORMAL HIGH (ref 0.8–1.2)
Prothrombin Time: 17.9 s — ABNORMAL HIGH (ref 11.4–15.2)

## 2024-07-23 LAB — I-STAT CG4 LACTIC ACID, ED: Lactic Acid, Venous: 2.5 mmol/L (ref 0.5–1.9)

## 2024-07-23 MED ORDER — MIDAZOLAM HCL 2 MG/2ML IJ SOLN
INTRAMUSCULAR | Status: AC | PRN
Start: 2024-07-23 — End: 2024-07-23
  Administered 2024-07-23 (×2): 2 mg via INTRAVENOUS

## 2024-07-23 MED ORDER — FENTANYL CITRATE (PF) 100 MCG/2ML IJ SOLN
INTRAMUSCULAR | Status: AC | PRN
  Administered 2024-07-23 (×2): 100 ug via INTRAVENOUS

## 2024-07-23 MED ORDER — MIDAZOLAM HCL 2 MG/2ML IJ SOLN
INTRAMUSCULAR | Status: AC
  Filled 2024-07-23: qty 2

## 2024-07-23 MED ORDER — FENTANYL 2500MCG IN NS 250ML (10MCG/ML) PREMIX INFUSION
0.0000 ug/h | INTRAVENOUS | Status: DC
  Administered 2024-07-23 (×2): 150 ug/h via INTRAVENOUS
  Filled 2024-07-23: qty 250

## 2024-07-23 MED ORDER — FENTANYL BOLUS VIA INFUSION: 25.0000 ug | INTRAVENOUS | Status: DC | PRN

## 2024-07-23 MED ORDER — FENTANYL CITRATE PF 50 MCG/ML IJ SOSY: 25.0000 ug | PREFILLED_SYRINGE | Freq: Once | INTRAMUSCULAR | Status: DC

## 2024-07-23 MED ORDER — ETOMIDATE 2 MG/ML IV SOLN
INTRAVENOUS | Status: AC | PRN
  Administered 2024-07-23 (×2): 20 mg via INTRAVENOUS

## 2024-07-23 MED ORDER — SUCCINYLCHOLINE CHLORIDE 20 MG/ML IJ SOLN
INTRAMUSCULAR | Status: AC | PRN
  Administered 2024-07-23 (×2): 200 mg via INTRAVENOUS

## 2024-07-23 MED ORDER — FENTANYL CITRATE PF 50 MCG/ML IJ SOSY
PREFILLED_SYRINGE | INTRAMUSCULAR | Status: AC
  Filled 2024-07-23: qty 2
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
## 2024-07-23 NOTE — Progress Notes (Signed)
   07/23/24 2340  Spiritual Encounters  Type of Visit Initial  Reason for visit Trauma  OnCall Visit Yes   Chaplain responded to Trauma 1 page. Patient was unconscious and was transported to CT scan. No family members were present. No spiritual need at this time but chaplain is available upon request.  Chaplain Therisa Samuel

## 2024-07-23 NOTE — ED Notes (Signed)
 Patient transported to CT scan .
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
## 2024-07-24 ENCOUNTER — Encounter (HOSPITAL_COMMUNITY): Payer: Self-pay

## 2024-07-24 ENCOUNTER — Inpatient Hospital Stay (HOSPITAL_COMMUNITY)

## 2024-07-24 ENCOUNTER — Emergency Department (HOSPITAL_COMMUNITY)

## 2024-07-24 DIAGNOSIS — S0993XA Unspecified injury of face, initial encounter: Secondary | ICD-10-CM | POA: Diagnosis not present

## 2024-07-24 DIAGNOSIS — R918 Other nonspecific abnormal finding of lung field: Secondary | ICD-10-CM | POA: Diagnosis not present

## 2024-07-24 DIAGNOSIS — I83028 Varicose veins of left lower extremity with ulcer other part of lower leg: Secondary | ICD-10-CM | POA: Diagnosis present

## 2024-07-24 DIAGNOSIS — J439 Emphysema, unspecified: Secondary | ICD-10-CM | POA: Diagnosis not present

## 2024-07-24 DIAGNOSIS — Z21 Asymptomatic human immunodeficiency virus [HIV] infection status: Secondary | ICD-10-CM | POA: Diagnosis present

## 2024-07-24 DIAGNOSIS — R188 Other ascites: Secondary | ICD-10-CM | POA: Diagnosis present

## 2024-07-24 DIAGNOSIS — S3991XA Unspecified injury of abdomen, initial encounter: Secondary | ICD-10-CM | POA: Diagnosis not present

## 2024-07-24 DIAGNOSIS — D6959 Other secondary thrombocytopenia: Secondary | ICD-10-CM | POA: Diagnosis not present

## 2024-07-24 DIAGNOSIS — I5031 Acute diastolic (congestive) heart failure: Secondary | ICD-10-CM | POA: Diagnosis not present

## 2024-07-24 DIAGNOSIS — J9811 Atelectasis: Secondary | ICD-10-CM | POA: Diagnosis not present

## 2024-07-24 DIAGNOSIS — E872 Acidosis, unspecified: Secondary | ICD-10-CM | POA: Diagnosis not present

## 2024-07-24 DIAGNOSIS — R4182 Altered mental status, unspecified: Secondary | ICD-10-CM | POA: Diagnosis present

## 2024-07-24 DIAGNOSIS — J90 Pleural effusion, not elsewhere classified: Secondary | ICD-10-CM | POA: Diagnosis not present

## 2024-07-24 DIAGNOSIS — I509 Heart failure, unspecified: Secondary | ICD-10-CM | POA: Diagnosis not present

## 2024-07-24 DIAGNOSIS — I5021 Acute systolic (congestive) heart failure: Secondary | ICD-10-CM | POA: Diagnosis not present

## 2024-07-24 DIAGNOSIS — F15159 Other stimulant abuse with stimulant-induced psychotic disorder, unspecified: Secondary | ICD-10-CM | POA: Diagnosis present

## 2024-07-24 DIAGNOSIS — N179 Acute kidney failure, unspecified: Secondary | ICD-10-CM | POA: Diagnosis not present

## 2024-07-24 DIAGNOSIS — D696 Thrombocytopenia, unspecified: Secondary | ICD-10-CM | POA: Diagnosis not present

## 2024-07-24 DIAGNOSIS — Z4682 Encounter for fitting and adjustment of non-vascular catheter: Secondary | ICD-10-CM | POA: Diagnosis not present

## 2024-07-24 DIAGNOSIS — L97919 Non-pressure chronic ulcer of unspecified part of right lower leg with unspecified severity: Secondary | ICD-10-CM | POA: Diagnosis present

## 2024-07-24 DIAGNOSIS — I83018 Varicose veins of right lower extremity with ulcer other part of lower leg: Secondary | ICD-10-CM | POA: Diagnosis present

## 2024-07-24 DIAGNOSIS — Y92488 Other paved roadways as the place of occurrence of the external cause: Secondary | ICD-10-CM | POA: Diagnosis not present

## 2024-07-24 DIAGNOSIS — S065X0A Traumatic subdural hemorrhage without loss of consciousness, initial encounter: Secondary | ICD-10-CM | POA: Diagnosis not present

## 2024-07-24 DIAGNOSIS — R0902 Hypoxemia: Secondary | ICD-10-CM | POA: Diagnosis not present

## 2024-07-24 DIAGNOSIS — I6200 Nontraumatic subdural hemorrhage, unspecified: Secondary | ICD-10-CM | POA: Diagnosis not present

## 2024-07-24 DIAGNOSIS — I5023 Acute on chronic systolic (congestive) heart failure: Secondary | ICD-10-CM | POA: Diagnosis not present

## 2024-07-24 DIAGNOSIS — L97929 Non-pressure chronic ulcer of unspecified part of left lower leg with unspecified severity: Secondary | ICD-10-CM | POA: Diagnosis present

## 2024-07-24 DIAGNOSIS — J449 Chronic obstructive pulmonary disease, unspecified: Secondary | ICD-10-CM | POA: Diagnosis not present

## 2024-07-24 DIAGNOSIS — F32A Depression, unspecified: Secondary | ICD-10-CM | POA: Diagnosis present

## 2024-07-24 DIAGNOSIS — I5082 Biventricular heart failure: Secondary | ICD-10-CM | POA: Diagnosis not present

## 2024-07-24 DIAGNOSIS — I2510 Atherosclerotic heart disease of native coronary artery without angina pectoris: Secondary | ICD-10-CM | POA: Diagnosis present

## 2024-07-24 DIAGNOSIS — E8809 Other disorders of plasma-protein metabolism, not elsewhere classified: Secondary | ICD-10-CM | POA: Diagnosis present

## 2024-07-24 DIAGNOSIS — S065XAA Traumatic subdural hemorrhage with loss of consciousness status unknown, initial encounter: Secondary | ICD-10-CM | POA: Diagnosis not present

## 2024-07-24 DIAGNOSIS — Z86711 Personal history of pulmonary embolism: Secondary | ICD-10-CM | POA: Diagnosis not present

## 2024-07-24 DIAGNOSIS — I5022 Chronic systolic (congestive) heart failure: Secondary | ICD-10-CM | POA: Diagnosis not present

## 2024-07-24 DIAGNOSIS — F1910 Other psychoactive substance abuse, uncomplicated: Secondary | ICD-10-CM | POA: Diagnosis not present

## 2024-07-24 DIAGNOSIS — I428 Other cardiomyopathies: Secondary | ICD-10-CM | POA: Diagnosis present

## 2024-07-24 DIAGNOSIS — I5043 Acute on chronic combined systolic (congestive) and diastolic (congestive) heart failure: Secondary | ICD-10-CM | POA: Diagnosis not present

## 2024-07-24 DIAGNOSIS — E11622 Type 2 diabetes mellitus with other skin ulcer: Secondary | ICD-10-CM | POA: Diagnosis not present

## 2024-07-24 DIAGNOSIS — E871 Hypo-osmolality and hyponatremia: Secondary | ICD-10-CM | POA: Diagnosis not present

## 2024-07-24 DIAGNOSIS — F14159 Cocaine abuse with cocaine-induced psychotic disorder, unspecified: Secondary | ICD-10-CM | POA: Diagnosis present

## 2024-07-24 DIAGNOSIS — I517 Cardiomegaly: Secondary | ICD-10-CM | POA: Diagnosis not present

## 2024-07-24 DIAGNOSIS — S2243XA Multiple fractures of ribs, bilateral, initial encounter for closed fracture: Secondary | ICD-10-CM | POA: Diagnosis not present

## 2024-07-24 DIAGNOSIS — Z452 Encounter for adjustment and management of vascular access device: Secondary | ICD-10-CM | POA: Diagnosis not present

## 2024-07-24 DIAGNOSIS — Z7901 Long term (current) use of anticoagulants: Secondary | ICD-10-CM | POA: Diagnosis not present

## 2024-07-24 DIAGNOSIS — I110 Hypertensive heart disease with heart failure: Secondary | ICD-10-CM | POA: Diagnosis not present

## 2024-07-24 DIAGNOSIS — J9601 Acute respiratory failure with hypoxia: Secondary | ICD-10-CM | POA: Diagnosis not present

## 2024-07-24 DIAGNOSIS — R0989 Other specified symptoms and signs involving the circulatory and respiratory systems: Secondary | ICD-10-CM | POA: Diagnosis not present

## 2024-07-24 LAB — CBC
HCT: 42.5 % (ref 39.0–52.0)
Hemoglobin: 13.2 g/dL (ref 13.0–17.0)
MCH: 29.2 pg (ref 26.0–34.0)
MCHC: 31.1 g/dL (ref 30.0–36.0)
MCV: 94 fL (ref 80.0–100.0)
Platelets: 72 K/uL — ABNORMAL LOW (ref 150–400)
RBC: 4.52 MIL/uL (ref 4.22–5.81)
RDW: 15.3 % (ref 11.5–15.5)
WBC: 7.4 K/uL (ref 4.0–10.5)
nRBC: 0.3 % — ABNORMAL HIGH (ref 0.0–0.2)

## 2024-07-24 LAB — TRAUMA TEG PANEL
CFF Max Amplitude: 9.9 mm — ABNORMAL LOW (ref 15–32)
Citrated Kaolin (R): 6.5 min (ref 4.6–9.1)
Citrated Rapid TEG (MA): 42.1 mm — ABNORMAL LOW (ref 52–70)
Lysis at 30 Minutes: 0 % (ref 0.0–2.6)

## 2024-07-24 LAB — I-STAT ARTERIAL BLOOD GAS, ED
Acid-Base Excess: 2 mmol/L (ref 0.0–2.0)
Bicarbonate: 29.4 mmol/L — ABNORMAL HIGH (ref 20.0–28.0)
Calcium, Ion: 1.07 mmol/L — ABNORMAL LOW (ref 1.15–1.40)
HCT: 44 % (ref 39.0–52.0)
Hemoglobin: 15 g/dL (ref 13.0–17.0)
O2 Saturation: 100 %
Patient temperature: 98.1
Potassium: 3.6 mmol/L (ref 3.5–5.1)
Sodium: 132 mmol/L — ABNORMAL LOW (ref 135–145)
TCO2: 31 mmol/L (ref 22–32)
pCO2 arterial: 56.2 mmHg — ABNORMAL HIGH (ref 32–48)
pH, Arterial: 7.325 — ABNORMAL LOW (ref 7.35–7.45)
pO2, Arterial: 259 mmHg — ABNORMAL HIGH (ref 83–108)

## 2024-07-24 LAB — URINALYSIS, ROUTINE W REFLEX MICROSCOPIC
Bilirubin Urine: NEGATIVE
Glucose, UA: 150 mg/dL — AB
Ketones, ur: NEGATIVE mg/dL
Nitrite: NEGATIVE
Protein, ur: 30 mg/dL — AB
Specific Gravity, Urine: 1.017 (ref 1.005–1.030)
pH: 6 (ref 5.0–8.0)

## 2024-07-24 LAB — BASIC METABOLIC PANEL WITH GFR
Anion gap: 12 (ref 5–15)
BUN: 24 mg/dL — ABNORMAL HIGH (ref 8–23)
CO2: 25 mmol/L (ref 22–32)
Calcium: 7.3 mg/dL — ABNORMAL LOW (ref 8.9–10.3)
Chloride: 94 mmol/L — ABNORMAL LOW (ref 98–111)
Creatinine, Ser: 1.43 mg/dL — ABNORMAL HIGH (ref 0.61–1.24)
GFR, Estimated: 56 mL/min — ABNORMAL LOW (ref >60)
Glucose, Bld: 127 mg/dL — ABNORMAL HIGH (ref 70–99)
Potassium: 4 mmol/L (ref 3.5–5.1)
Sodium: 131 mmol/L — ABNORMAL LOW (ref 135–145)

## 2024-07-24 LAB — RAPID URINE DRUG SCREEN, HOSP PERFORMED
Amphetamines: POSITIVE — AB
Barbiturates: NOT DETECTED
Benzodiazepines: POSITIVE — AB
Cocaine: NOT DETECTED
Opiates: NOT DETECTED
Tetrahydrocannabinol: NOT DETECTED

## 2024-07-24 LAB — MRSA NEXT GEN BY PCR, NASAL: MRSA by PCR Next Gen: NOT DETECTED

## 2024-07-24 LAB — LACTIC ACID, PLASMA: Lactic Acid, Venous: 1.6 mmol/L (ref 0.5–1.9)

## 2024-07-24 LAB — HIV ANTIBODY (ROUTINE TESTING W REFLEX): HIV Screen 4th Generation wRfx: REACTIVE — AB

## 2024-07-24 LAB — MAGNESIUM: Magnesium: 1.8 mg/dL (ref 1.7–2.4)

## 2024-07-24 MED ORDER — CHLORHEXIDINE GLUCONATE CLOTH 2 % EX PADS
6.0000 | MEDICATED_PAD | Freq: Every day | CUTANEOUS | Status: DC
Start: 2024-07-24 — End: 2024-08-03
  Administered 2024-07-25 – 2024-08-03 (×20): 6 via TOPICAL

## 2024-07-24 MED ORDER — ORAL CARE MOUTH RINSE
15.0000 mL | OROMUCOSAL | Status: DC
Start: 2024-07-24 — End: 2024-07-25
  Administered 2024-07-24 – 2024-07-25 (×36): 15 mL via OROMUCOSAL

## 2024-07-24 MED ORDER — IOHEXOL 350 MG/ML SOLN
75.0000 mL | Freq: Once | INTRAVENOUS | Status: AC | PRN
Start: 2024-07-24 — End: 2024-07-24
  Administered 2024-07-24 (×2): 75 mL via INTRAVENOUS

## 2024-07-24 MED ORDER — POLYETHYLENE GLYCOL 3350 17 G PO PACK
17.0000 g | PACK | Freq: Every day | ORAL | Status: DC
Start: 2024-07-24 — End: 2024-07-25

## 2024-07-24 MED ORDER — ONDANSETRON HCL 4 MG/2ML IJ SOLN
4.0000 mg | Freq: Four times a day (QID) | INTRAMUSCULAR | Status: DC | PRN
  Administered 2024-07-26 – 2024-07-30 (×4): 4 mg via INTRAVENOUS
  Filled 2024-07-24 (×2): qty 2

## 2024-07-24 MED ORDER — DOCUSATE SODIUM 50 MG/5ML PO LIQD: 100.0000 mg | Freq: Two times a day (BID) | ORAL | Status: DC

## 2024-07-24 MED ORDER — ONDANSETRON 4 MG PO TBDP: 4.0000 mg | ORAL_TABLET | Freq: Four times a day (QID) | ORAL | Status: DC | PRN

## 2024-07-24 MED ORDER — FENTANYL BOLUS VIA INFUSION
25.0000 ug | INTRAVENOUS | Status: DC | PRN
  Administered 2024-07-24 (×4): 50 ug via INTRAVENOUS
  Administered 2024-07-24: 25 ug via INTRAVENOUS
  Administered 2024-07-24 (×3): 50 ug via INTRAVENOUS
  Administered 2024-07-24: 25 ug via INTRAVENOUS
  Administered 2024-07-24 – 2024-07-25 (×3): 50 ug via INTRAVENOUS
  Administered 2024-07-25: 100 ug via INTRAVENOUS
  Administered 2024-07-25 (×2): 50 ug via INTRAVENOUS
  Administered 2024-07-25: 100 ug via INTRAVENOUS

## 2024-07-24 MED ORDER — LACTATED RINGERS IV SOLN: INTRAVENOUS | Status: DC

## 2024-07-24 MED ORDER — METHOCARBAMOL 1000 MG/10ML IJ SOLN
500.0000 mg | Freq: Three times a day (TID) | INTRAMUSCULAR | Status: DC
  Administered 2024-07-24 – 2024-07-25 (×10): 500 mg via INTRAVENOUS
  Filled 2024-07-24 (×5): qty 10

## 2024-07-24 MED ORDER — HYDRALAZINE HCL 20 MG/ML IJ SOLN: 10.0000 mg | INTRAMUSCULAR | Status: DC | PRN

## 2024-07-24 MED ORDER — PROPOFOL 1000 MG/100ML IV EMUL
0.0000 ug/kg/min | INTRAVENOUS | Status: DC
  Administered 2024-07-24: 5 ug/kg/min via INTRAVENOUS
  Administered 2024-07-24 (×2): 20 ug/kg/min via INTRAVENOUS
  Administered 2024-07-24: 5 ug/kg/min via INTRAVENOUS
  Administered 2024-07-25 (×2): 20 ug/kg/min via INTRAVENOUS
  Filled 2024-07-24 (×2): qty 100

## 2024-07-24 MED ORDER — POLYETHYLENE GLYCOL 3350 17 G PO PACK: 17.0000 g | PACK | Freq: Every day | ORAL | Status: DC | PRN

## 2024-07-24 MED ORDER — MIDAZOLAM HCL 2 MG/2ML IJ SOLN: 1.0000 mg | INTRAMUSCULAR | Status: DC | PRN

## 2024-07-24 MED ORDER — PROPOFOL 1000 MG/100ML IV EMUL
INTRAVENOUS | Status: AC
  Filled 2024-07-24: qty 100

## 2024-07-24 MED ORDER — FENTANYL 2500MCG IN NS 250ML (10MCG/ML) PREMIX INFUSION
0.0000 ug/h | INTRAVENOUS | Status: DC
Start: 2024-07-24 — End: 2024-07-25
  Administered 2024-07-24 (×2): 75 ug/h via INTRAVENOUS
  Filled 2024-07-24: qty 250

## 2024-07-24 MED ORDER — METHOCARBAMOL 500 MG PO TABS
500.0000 mg | ORAL_TABLET | Freq: Three times a day (TID) | ORAL | Status: AC
  Administered 2024-07-25 – 2024-07-26 (×8): 500 mg via ORAL
  Filled 2024-07-24 (×4): qty 1

## 2024-07-24 MED ORDER — METOPROLOL TARTRATE 5 MG/5ML IV SOLN: 5.0000 mg | Freq: Four times a day (QID) | INTRAVENOUS | Status: DC | PRN

## 2024-07-24 MED ORDER — DOCUSATE SODIUM 100 MG PO CAPS: 100.0000 mg | ORAL_CAPSULE | Freq: Two times a day (BID) | ORAL | Status: DC

## 2024-07-24 MED ORDER — ORAL CARE MOUTH RINSE: 15.0000 mL | OROMUCOSAL | Status: DC | PRN

## 2024-07-24 NOTE — Progress Notes (Signed)
   07/24/24 0355  Vitals  BP 115/80  MAP (mmHg) 91  Pulse Rate (!) 103  ECG Heart Rate (!) 103  Resp 18  Oxygen Therapy  SpO2 100 %  MEWS Score  MEWS Temp 0  MEWS Systolic 0  MEWS Pulse 1  MEWS RR 0  MEWS LOC 3  MEWS Score 4  MEWS Score Color Red  Provider Notification  Provider Name/Title Teresa Bruckner  Date Provider Notified 07/24/24  Time Provider Notified [phone]  Method of Notification Page (Secure Chat)  Notification Reason New onset of dysrhythmia;Other (Comment) (TEG test collection and 16 beat of V-tach)  Type of New Onset of Dysrhythmia Ventricular tachycardia  Symptoms of New Onset of Dysrhythmia Asymptomatic;Other (Comment) (pt intubated)  Provider response No new orders;Other (Comment) (continue cardiac monitoring)  Date of Provider Response 07/24/24  Time of Provider Response [phone]

## 2024-07-24 NOTE — Progress Notes (Signed)
 RT attempted weaning PS 8 CPAP 5. Pt had lack of respirations with low tidal volumes and minute ventilation with weaning. Pt was place back on full support at this time. RT will attempt wean trial at a later time.
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
## 2024-07-24 NOTE — ED Notes (Signed)
 Report given to [facility] ICU nurse , RT notified .

## 2024-07-24 NOTE — Plan of Care (Signed)
  Problem: Education: Goal: Knowledge of General Education information will improve Description: Including pain rating scale, medication(s)/side effects and non-pharmacologic comfort measures Outcome: Progressing   Problem: Safety: Goal: Non-violent Restraint(s) Outcome: Progressing
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
## 2024-07-24 NOTE — Progress Notes (Addendum)
 Patient off of sedation medications, remains on half dose of fentanyl (75mcg). Patient responds to voice, follow commands, and attempts to communicate around ETT. Family given update, they also included that patient can become aggressive when in the hospital. Multiple PRN fentanyl boluses required to perform nursing responsibilities such as turning, wound care, and suctioning due to agitation and restlessness.  Patient family visited, states patient began drug use around May 2024. States he has previous history of abusing alcohol and are unsure if he restarted use. Family also states he is most likely noncompliant with medications.

## 2024-07-24 NOTE — Plan of Care (Signed)

## 2024-07-24 NOTE — Plan of Care (Signed)
 Patient continued to become more alert as the day progressed. After failing SBT, patient restarted on low dose sedation for comfort. Leg wounds wrapped with petroleum gauze and kerlex while waiting for wound care consult.

## 2024-07-24 NOTE — ED Notes (Signed)
 Neuro surgery at bedside.
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
## 2024-07-24 NOTE — Progress Notes (Signed)
 Transported pt from ED TRA B to [id number] with bedside RN. Vitals are stable.
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
## 2024-07-25 ENCOUNTER — Encounter (HOSPITAL_COMMUNITY): Payer: Self-pay

## 2024-07-25 ENCOUNTER — Other Ambulatory Visit: Payer: Self-pay

## 2024-07-25 LAB — CBC
HCT: 45.9 % (ref 39.0–52.0)
Hemoglobin: 14.2 g/dL (ref 13.0–17.0)
MCH: 29.2 pg (ref 26.0–34.0)
MCHC: 30.9 g/dL (ref 30.0–36.0)
MCV: 94.4 fL (ref 80.0–100.0)
Platelets: 86 K/uL — ABNORMAL LOW (ref 150–400)
RBC: 4.86 MIL/uL (ref 4.22–5.81)
RDW: 15.7 % — ABNORMAL HIGH (ref 11.5–15.5)
WBC: 7.8 K/uL (ref 4.0–10.5)
nRBC: 0 % (ref 0.0–0.2)

## 2024-07-25 LAB — BASIC METABOLIC PANEL WITH GFR
Anion gap: 8 (ref 5–15)
BUN: 14 mg/dL (ref 8–23)
CO2: 28 mmol/L (ref 22–32)
Calcium: 7.5 mg/dL — ABNORMAL LOW (ref 8.9–10.3)
Chloride: 99 mmol/L (ref 98–111)
Creatinine, Ser: 1.37 mg/dL — ABNORMAL HIGH (ref 0.61–1.24)
GFR, Estimated: 59 mL/min — ABNORMAL LOW (ref >60)
Glucose, Bld: 182 mg/dL — ABNORMAL HIGH (ref 70–99)
Potassium: 3.6 mmol/L (ref 3.5–5.1)
Sodium: 135 mmol/L (ref 135–145)

## 2024-07-25 MED ORDER — MEDIHONEY WOUND/BURN DRESSING EX PSTE
1.0000 | PASTE | Freq: Every day | CUTANEOUS | Status: DC
  Administered 2024-07-25 – 2024-08-03 (×20): 1 via TOPICAL
  Filled 2024-07-25 (×4): qty 44

## 2024-07-25 MED ORDER — ORAL CARE MOUTH RINSE: 15.0000 mL | OROMUCOSAL | Status: DC | PRN

## 2024-07-25 MED ORDER — DEXMEDETOMIDINE HCL IN NACL 400 MCG/100ML IV SOLN
0.0000 ug/kg/h | INTRAVENOUS | Status: AC
  Administered 2024-07-25 (×4): 0.3 ug/kg/h via INTRAVENOUS
  Administered 2024-07-26 (×2): 0.7 ug/kg/h via INTRAVENOUS
  Filled 2024-07-25 (×3): qty 100

## 2024-07-25 MED ORDER — NICOTINE 14 MG/24HR TD PT24
14.0000 mg | MEDICATED_PATCH | Freq: Every day | TRANSDERMAL | Status: DC
  Administered 2024-07-25 – 2024-08-03 (×20): 14 mg via TRANSDERMAL
  Filled 2024-07-25 (×10): qty 1
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
## 2024-07-25 NOTE — Procedures (Signed)
 Extubation Procedure Note  Patient Details:   Name: Devin [name] DOB: 1961-01-05 MRN: [id number]   Airway Documentation:    Vent end date: 07/25/24 Vent end time: 0915   Evaluation  O2 sats: stable throughout Complications: No apparent complications Patient did tolerate procedure well. Bilateral Breath Sounds: Diminished   Yes  Pt was extubated per MD order and placed on 3 L [place]. Cuff leak was noted prior to extubation and no stridor post. Pt is stable at this time. RT will monitor.   Samul Mcinroy 07/25/2024, 9:17 AM

## 2024-07-25 NOTE — Progress Notes (Signed)
 Patient ID: Devin [name], male   DOB: 1961-02-06, 63 y.o.   MRN: [id number] Patient much improved this morning much more awake still remains intubated but follows commands moves all extremities well  Agree with plans to extubate.  No new neurosurgical recommendations when patient extubated and able to communicate can discuss may be clinically clearing his neck.

## 2024-07-25 NOTE — Consult Note (Addendum)
 WOC Nurse Consult Note: Reason for Consult: requested to assess bilateral leg wounds. Performed remotely evaluating photos and notes. Wound type: Cellulitis Pressure Injury POA: NA Measurement: multiple wounds around lower extremities, bellow knee. (See flowsheet) Wound bed: 100% yellow slough/dark eschar. Drainage (amount, consistency, odor) None. Periwound: erythema, edema. Dressing procedure/placement/frequency: Cleanse with Vashe [id number], not rinse, pat dry the peri-wound skin. Apply Medihoney to the wound bed, cover with foam dressing, it's ok to lift the foam and reapply the Medihoney.  The foam can stay up to 3 days or change PRN. Wrap the legs with Kerlix and ACE wrap, starting of the base of toes to the knee, including the heels. Change daily.  WOC team will not plan to follow further. Please reconsult if further assistance is needed. Thank-you,  Lela Holm RN, CNS, [employer], MSN.  (Phone [phone])
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
## 2024-07-25 NOTE — Consult Note (Incomplete)
 WOC Nurse Consult Note: Reason for Consult: requested to assess bilateral leg wounds. Wound type: Cellulitis Pressure Injury POA: NA Measurement: multiple wounds around malleolar medial Wound bed: Drainage (amount, consistency, odor)  Periwound: Dressing procedure/placement/frequency: Conservative sharp wound debridement (CSWD performed at the bedside):

## 2024-07-26 LAB — HIV-1/2 AB - DIFFERENTIATION
HIV 1 Ab: REACTIVE — AB
HIV 2 Ab: NONREACTIVE

## 2024-07-26 LAB — COMPREHENSIVE METABOLIC PANEL WITH GFR
ALT: 91 U/L — ABNORMAL HIGH (ref 0–44)
AST: 35 U/L (ref 15–41)
Albumin: 1.8 g/dL — ABNORMAL LOW (ref 3.5–5.0)
Alkaline Phosphatase: 111 U/L (ref 38–126)
Anion gap: 10 (ref 5–15)
BUN: 16 mg/dL (ref 8–23)
CO2: 25 mmol/L (ref 22–32)
Calcium: 7.4 mg/dL — ABNORMAL LOW (ref 8.9–10.3)
Chloride: 98 mmol/L (ref 98–111)
Creatinine, Ser: 1.19 mg/dL (ref 0.61–1.24)
GFR, Estimated: >60 mL/min (ref >60)
Glucose, Bld: 200 mg/dL — ABNORMAL HIGH (ref 70–99)
Potassium: 3.6 mmol/L (ref 3.5–5.1)
Sodium: 133 mmol/L — ABNORMAL LOW (ref 135–145)
Total Bilirubin: 1.3 mg/dL — ABNORMAL HIGH (ref 0.0–1.2)
Total Protein: 5.3 g/dL — ABNORMAL LOW (ref 6.5–8.1)

## 2024-07-26 LAB — GLUCOSE, CAPILLARY
Glucose-Capillary: 159 mg/dL — ABNORMAL HIGH (ref 70–99)
Glucose-Capillary: 163 mg/dL — ABNORMAL HIGH (ref 70–99)
Glucose-Capillary: 177 mg/dL — ABNORMAL HIGH (ref 70–99)

## 2024-07-26 LAB — HEMOGLOBIN A1C
Hgb A1c MFr Bld: 6.3 % — ABNORMAL HIGH (ref 4.8–5.6)
Mean Plasma Glucose: 134.11 mg/dL

## 2024-07-26 MED ORDER — ENOXAPARIN SODIUM 30 MG/0.3ML IJ SOSY
30.0000 mg | PREFILLED_SYRINGE | Freq: Two times a day (BID) | INTRAMUSCULAR | Status: DC
Start: 2024-07-26 — End: 2024-07-28
  Administered 2024-07-26 – 2024-07-28 (×8): 30 mg via SUBCUTANEOUS
  Filled 2024-07-26 (×4): qty 0.3

## 2024-07-26 MED ORDER — INSULIN ASPART 100 UNIT/ML IJ SOLN
0.0000 [IU] | Freq: Three times a day (TID) | INTRAMUSCULAR | Status: DC
  Administered 2024-07-26 – 2024-07-27 (×8): 3 [IU] via SUBCUTANEOUS
  Administered 2024-07-28 (×2): 2 [IU] via SUBCUTANEOUS
  Administered 2024-07-29 (×2): 3 [IU] via SUBCUTANEOUS
  Administered 2024-07-29 (×2): 2 [IU] via SUBCUTANEOUS
  Administered 2024-07-29 – 2024-07-30 (×3): 3 [IU] via SUBCUTANEOUS
  Administered 2024-07-30: 2 [IU] via SUBCUTANEOUS
  Administered 2024-07-30: 3 [IU] via SUBCUTANEOUS
  Administered 2024-07-30: 2 [IU] via SUBCUTANEOUS
  Administered 2024-07-30 (×2): 3 [IU] via SUBCUTANEOUS
  Administered 2024-07-31: 2 [IU] via SUBCUTANEOUS
  Administered 2024-07-31: 3 [IU] via SUBCUTANEOUS
  Administered 2024-07-31: 2 [IU] via SUBCUTANEOUS
  Administered 2024-07-31: 3 [IU] via SUBCUTANEOUS
  Administered 2024-08-01 – 2024-08-02 (×6): 5 [IU] via SUBCUTANEOUS
  Administered 2024-08-03: 2 [IU] via SUBCUTANEOUS
  Administered 2024-08-03: 11 [IU] via SUBCUTANEOUS
  Administered 2024-08-03: 2 [IU] via SUBCUTANEOUS
  Administered 2024-08-03: 11 [IU] via SUBCUTANEOUS

## 2024-07-26 MED ORDER — OXYCODONE HCL 5 MG PO TABS
2.5000 mg | ORAL_TABLET | ORAL | Status: DC | PRN
  Administered 2024-07-26 – 2024-08-03 (×14): 5 mg via ORAL
  Filled 2024-07-26 (×7): qty 1

## 2024-07-26 MED ORDER — BUSPIRONE HCL 10 MG PO TABS
10.0000 mg | ORAL_TABLET | Freq: Three times a day (TID) | ORAL | Status: DC
  Administered 2024-07-26 – 2024-08-03 (×48): 10 mg via ORAL
  Filled 2024-07-26 (×26): qty 1

## 2024-07-26 MED ORDER — GUAIFENESIN 100 MG/5ML PO LIQD
10.0000 mL | ORAL | Status: AC
  Administered 2024-07-26 – 2024-07-29 (×32): 10 mL via ORAL
  Filled 2024-07-26 (×9): qty 10
  Filled 2024-07-26: qty 15
  Filled 2024-07-26 (×7): qty 10

## 2024-07-26 MED ORDER — IPRATROPIUM BROMIDE 0.02 % IN SOLN
0.5000 mg | RESPIRATORY_TRACT | Status: DC | PRN
  Administered 2024-07-26 – 2024-07-28 (×8): 0.5 mg via RESPIRATORY_TRACT
  Filled 2024-07-26 (×4): qty 2.5

## 2024-07-26 MED ORDER — DULOXETINE HCL 60 MG PO CPEP
60.0000 mg | ORAL_CAPSULE | Freq: Every day | ORAL | Status: DC
  Administered 2024-07-26 – 2024-08-03 (×18): 60 mg via ORAL
  Filled 2024-07-26 (×9): qty 1

## 2024-07-26 MED ORDER — CARVEDILOL 3.125 MG PO TABS
3.1250 mg | ORAL_TABLET | Freq: Two times a day (BID) | ORAL | Status: DC
  Administered 2024-07-26 – 2024-07-27 (×8): 3.125 mg via ORAL
  Filled 2024-07-26 (×4): qty 1

## 2024-07-26 MED ORDER — ACETAMINOPHEN 500 MG PO TABS
1000.0000 mg | ORAL_TABLET | Freq: Four times a day (QID) | ORAL | Status: DC
  Administered 2024-07-26 – 2024-08-01 (×42): 1000 mg via ORAL
  Filled 2024-07-26 (×24): qty 2

## 2024-07-26 MED ORDER — POTASSIUM CHLORIDE CRYS ER 20 MEQ PO TBCR
40.0000 meq | EXTENDED_RELEASE_TABLET | Freq: Once | ORAL | Status: AC
  Administered 2024-07-26 (×2): 40 meq via ORAL
  Filled 2024-07-26: qty 2
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
## 2024-07-26 NOTE — Plan of Care (Signed)

## 2024-07-26 NOTE — Progress Notes (Signed)
   07/26/24 1505  Assess: MEWS Score  Temp 98.3 F (36.8 C)  BP 135/89  MAP (mmHg) 103  Pulse Rate (!) 122  Resp 18  Level of Consciousness Alert  SpO2 92 %  O2 Device Room Air  Assess: MEWS Score  MEWS Temp 0  MEWS Systolic 0  MEWS Pulse 2  MEWS RR 0  MEWS LOC 0  MEWS Score 2  MEWS Score Color Yellow  Assess: if the MEWS score is Yellow or Red  Were vital signs accurate and taken at a resting state? Yes  Does the patient meet 2 or more of the SIRS criteria? No  MEWS guidelines implemented  No, previously yellow, continue vital signs every 4 hours  Assess: SIRS CRITERIA  SIRS Temperature  0  SIRS Respirations  0  SIRS Pulse 1  SIRS WBC 0  SIRS Score Sum  1   Pt admitted from ICU, a&o x3, no c/o pain, oriented to the floor, VSS, connected to tele.
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
## 2024-07-27 ENCOUNTER — Inpatient Hospital Stay (HOSPITAL_COMMUNITY)

## 2024-07-27 DIAGNOSIS — I5043 Acute on chronic combined systolic (congestive) and diastolic (congestive) heart failure: Secondary | ICD-10-CM

## 2024-07-27 DIAGNOSIS — I5023 Acute on chronic systolic (congestive) heart failure: Secondary | ICD-10-CM | POA: Insufficient documentation

## 2024-07-27 LAB — COMPREHENSIVE METABOLIC PANEL WITH GFR
ALT: 82 U/L — ABNORMAL HIGH (ref 0–44)
AST: 32 U/L (ref 15–41)
Albumin: 2.3 g/dL — ABNORMAL LOW (ref 3.5–5.0)
Alkaline Phosphatase: 105 U/L (ref 38–126)
Anion gap: 7 (ref 5–15)
BUN: 20 mg/dL (ref 8–23)
CO2: 29 mmol/L (ref 22–32)
Calcium: 7.9 mg/dL — ABNORMAL LOW (ref 8.9–10.3)
Chloride: 95 mmol/L — ABNORMAL LOW (ref 98–111)
Creatinine, Ser: 1.13 mg/dL (ref 0.61–1.24)
GFR, Estimated: >60 mL/min (ref >60)
Glucose, Bld: 109 mg/dL — ABNORMAL HIGH (ref 70–99)
Potassium: 4.6 mmol/L (ref 3.5–5.1)
Sodium: 131 mmol/L — ABNORMAL LOW (ref 135–145)
Total Bilirubin: 1.6 mg/dL — ABNORMAL HIGH (ref 0.0–1.2)
Total Protein: 6.4 g/dL — ABNORMAL LOW (ref 6.5–8.1)

## 2024-07-27 LAB — CBC
HCT: 39.9 % (ref 39.0–52.0)
Hemoglobin: 12.4 g/dL — ABNORMAL LOW (ref 13.0–17.0)
MCH: 29.7 pg (ref 26.0–34.0)
MCHC: 31.1 g/dL (ref 30.0–36.0)
MCV: 95.5 fL (ref 80.0–100.0)
Platelets: 97 K/uL — ABNORMAL LOW (ref 150–400)
RBC: 4.18 MIL/uL — ABNORMAL LOW (ref 4.22–5.81)
RDW: 15.5 % (ref 11.5–15.5)
WBC: 5.7 K/uL (ref 4.0–10.5)
nRBC: 0 % (ref 0.0–0.2)

## 2024-07-27 LAB — GLUCOSE, CAPILLARY
Glucose-Capillary: 164 mg/dL — ABNORMAL HIGH (ref 70–99)
Glucose-Capillary: 184 mg/dL — ABNORMAL HIGH (ref 70–99)
Glucose-Capillary: 185 mg/dL — ABNORMAL HIGH (ref 70–99)
Glucose-Capillary: 187 mg/dL — ABNORMAL HIGH (ref 70–99)
Glucose-Capillary: 91 mg/dL (ref 70–99)
Glucose-Capillary: 97 mg/dL (ref 70–99)

## 2024-07-27 LAB — CD4/CD8 (T-HELPER/T-SUPPRESSOR CELL)
CD4 absolute: 220 /uL — ABNORMAL LOW (ref 400–1790)
CD4%: 31.46 % — ABNORMAL LOW (ref 33–65)
CD8 T Cell Abs: 374 /uL (ref 190–1000)
CD8tox: 53.43 % — ABNORMAL HIGH (ref 12–40)
Ratio: 0.59 — ABNORMAL LOW (ref 1.0–3.0)
Total lymphocyte count: 701 /uL — ABNORMAL LOW (ref 1000–4000)

## 2024-07-27 LAB — LACTIC ACID, PLASMA
Lactic Acid, Venous: 1.2 mmol/L (ref 0.5–1.9)
Lactic Acid, Venous: 1.5 mmol/L (ref 0.5–1.9)

## 2024-07-27 LAB — BRAIN NATRIURETIC PEPTIDE: B Natriuretic Peptide: >4500 pg/mL — ABNORMAL HIGH (ref 0.0–100.0)

## 2024-07-27 LAB — DIGOXIN LEVEL: Digoxin Level: <0.6 ng/mL — ABNORMAL LOW (ref 0.8–2.0)

## 2024-07-27 MED ORDER — FUROSEMIDE 10 MG/ML IJ SOLN
40.0000 mg | Freq: Two times a day (BID) | INTRAMUSCULAR | Status: DC
  Administered 2024-07-28 (×2): 40 mg via INTRAVENOUS
  Filled 2024-07-27: qty 4

## 2024-07-27 MED ORDER — FUROSEMIDE 10 MG/ML IJ SOLN
40.0000 mg | Freq: Once | INTRAMUSCULAR | Status: AC
  Administered 2024-07-27 (×2): 40 mg via INTRAVENOUS
  Filled 2024-07-27: qty 4

## 2024-07-27 MED ORDER — LORAZEPAM 2 MG/ML IJ SOLN: 0.5000 mg | Freq: Three times a day (TID) | INTRAMUSCULAR | Status: DC | PRN
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
## 2024-07-27 NOTE — Care Management Important Message (Signed)
 Important Message  Patient Details  Name: Devin [name] MRN: [id number] Date of Birth: 04-04-1961   Important Message Given:  Yes - Medicare IM     Jon Cruel 07/27/2024, 4:17 PM

## 2024-07-27 NOTE — Plan of Care (Signed)
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
## 2024-07-27 NOTE — Progress Notes (Signed)
 Patient is working with Dr. Eben and overdue for appt for HIV management -  I contacted Devin [name] with IM clinic and secured an appt for 08/10/2024 1:15 PM   Will request CD4 and current VL while he is admitted to gauge adherence on Biktarvy with further refills after Dr. Loa team.   Discussed yesterday with Dr. Paola Corean Fireman, MSN, NP-C [facility] for Infectious Disease [facility]  [email or web address] Pager: [phone] Office: [phone] [facility] Main Line: [phone] *Secure Chat Communication Welcome  Total Encounter Time: no charge

## 2024-07-27 NOTE — Progress Notes (Signed)
 Speech notified this RN when she went into room to work with pt he had a tooth in his hand. Pt pulled out upper front tooth, has some bleeding from gums, PA notified.

## 2024-07-28 ENCOUNTER — Encounter (HOSPITAL_COMMUNITY): Admission: EM | Disposition: A | Discharge status: Home / Self Care | Payer: Self-pay | Source: Home / Self Care

## 2024-07-28 ENCOUNTER — Other Ambulatory Visit: Payer: Self-pay

## 2024-07-28 ENCOUNTER — Inpatient Hospital Stay (HOSPITAL_COMMUNITY)

## 2024-07-28 DIAGNOSIS — I5043 Acute on chronic combined systolic (congestive) and diastolic (congestive) heart failure: Secondary | ICD-10-CM

## 2024-07-28 DIAGNOSIS — I5082 Biventricular heart failure: Secondary | ICD-10-CM | POA: Diagnosis not present

## 2024-07-28 DIAGNOSIS — I5031 Acute diastolic (congestive) heart failure: Secondary | ICD-10-CM

## 2024-07-28 DIAGNOSIS — I509 Heart failure, unspecified: Secondary | ICD-10-CM

## 2024-07-28 HISTORY — PX: RIGHT HEART CATH: CATH118263

## 2024-07-28 LAB — CBC
HCT: 40 % (ref 39.0–52.0)
Hemoglobin: 12.5 g/dL — ABNORMAL LOW (ref 13.0–17.0)
MCH: 29.6 pg (ref 26.0–34.0)
MCHC: 31.3 g/dL (ref 30.0–36.0)
MCV: 94.6 fL (ref 80.0–100.0)
Platelets: 106 K/uL — ABNORMAL LOW (ref 150–400)
RBC: 4.23 MIL/uL (ref 4.22–5.81)
RDW: 15.4 % (ref 11.5–15.5)
WBC: 6.3 K/uL (ref 4.0–10.5)
nRBC: 0 % (ref 0.0–0.2)

## 2024-07-28 LAB — COMPREHENSIVE METABOLIC PANEL WITH GFR
ALT: 73 U/L — ABNORMAL HIGH (ref 0–44)
AST: 29 U/L (ref 15–41)
Albumin: 2.1 g/dL — ABNORMAL LOW (ref 3.5–5.0)
Alkaline Phosphatase: 99 U/L (ref 38–126)
Anion gap: 5 (ref 5–15)
BUN: 23 mg/dL (ref 8–23)
CO2: 28 mmol/L (ref 22–32)
Calcium: 7.9 mg/dL — ABNORMAL LOW (ref 8.9–10.3)
Chloride: 96 mmol/L — ABNORMAL LOW (ref 98–111)
Creatinine, Ser: 1 mg/dL (ref 0.61–1.24)
GFR, Estimated: >60 mL/min (ref >60)
Glucose, Bld: 151 mg/dL — ABNORMAL HIGH (ref 70–99)
Potassium: 4.3 mmol/L (ref 3.5–5.1)
Sodium: 129 mmol/L — ABNORMAL LOW (ref 135–145)
Total Bilirubin: 1.3 mg/dL — ABNORMAL HIGH (ref 0.0–1.2)
Total Protein: 5.9 g/dL — ABNORMAL LOW (ref 6.5–8.1)

## 2024-07-28 LAB — HIV-1 RNA QUANT-NO REFLEX-BLD
HIV 1 RNA Quant: <20 {copies}/mL
LOG10 HIV-1 RNA: UNDETERMINED {Log_copies}/mL

## 2024-07-28 LAB — ECHOCARDIOGRAM COMPLETE
Height: 72 in
S' Lateral: 6.5 cm
Weight: 3506.2 [oz_av]

## 2024-07-28 LAB — GLUCOSE, CAPILLARY
Glucose-Capillary: 111 mg/dL — ABNORMAL HIGH (ref 70–99)
Glucose-Capillary: 123 mg/dL — ABNORMAL HIGH (ref 70–99)
Glucose-Capillary: 98 mg/dL (ref 70–99)
Glucose-Capillary: 117 mg/dL — ABNORMAL HIGH (ref 70–99)

## 2024-07-28 SURGERY — RIGHT HEART CATH
Anesthesia: LOCAL

## 2024-07-28 MED ORDER — EMPAGLIFLOZIN 10 MG PO TABS
10.0000 mg | ORAL_TABLET | Freq: Every day | ORAL | Status: DC
  Administered 2024-07-28 (×2): 10 mg via ORAL
  Filled 2024-07-28: qty 1

## 2024-07-28 MED ORDER — SODIUM CHLORIDE 0.9 % IV SOLN: 250.0000 mL | INTRAVENOUS | Status: AC | PRN

## 2024-07-28 MED ORDER — ONDANSETRON HCL 4 MG/2ML IJ SOLN: 4.0000 mg | Freq: Four times a day (QID) | INTRAMUSCULAR | Status: DC | PRN

## 2024-07-28 MED ORDER — SODIUM CHLORIDE 0.9% FLUSH: 3.0000 mL | INTRAVENOUS | Status: DC | PRN

## 2024-07-28 MED ORDER — PERFLUTREN LIPID MICROSPHERE
1.0000 mL | INTRAVENOUS | Status: AC | PRN
  Administered 2024-07-28 (×2): 2 mL via INTRAVENOUS

## 2024-07-28 MED ORDER — BISACODYL 10 MG RE SUPP: 10.0000 mg | Freq: Once | RECTAL | Status: DC

## 2024-07-28 MED ORDER — SPIRONOLACTONE 12.5 MG HALF TABLET
12.5000 mg | ORAL_TABLET | Freq: Every day | ORAL | Status: DC
  Administered 2024-07-28 (×2): 12.5 mg via ORAL
  Filled 2024-07-28: qty 1

## 2024-07-28 MED ORDER — ASPIRIN 81 MG PO CHEW: 81.0000 mg | CHEWABLE_TABLET | ORAL | Status: DC

## 2024-07-28 MED ORDER — MILRINONE LACTATE IN DEXTROSE 20-5 MG/100ML-% IV SOLN
INTRAVENOUS | Status: AC | PRN
  Administered 2024-07-28 (×2): .25 ug/kg/min via INTRAVENOUS

## 2024-07-28 MED ORDER — ACETAMINOPHEN 325 MG PO TABS: 650.0000 mg | ORAL_TABLET | ORAL | Status: DC | PRN

## 2024-07-28 MED ORDER — MILRINONE LACTATE IN DEXTROSE 20-5 MG/100ML-% IV SOLN
0.2500 ug/kg/min | INTRAVENOUS | Status: DC
  Administered 2024-07-28 – 2024-08-01 (×18): 0.25 ug/kg/min via INTRAVENOUS
  Filled 2024-07-28 (×8): qty 100

## 2024-07-28 MED ORDER — APIXABAN 5 MG PO TABS
5.0000 mg | ORAL_TABLET | Freq: Two times a day (BID) | ORAL | Status: DC
  Administered 2024-07-28 – 2024-08-03 (×24): 5 mg via ORAL
  Filled 2024-07-28 (×12): qty 1

## 2024-07-28 MED ORDER — MAGIC MOUTHWASH W/LIDOCAINE
10.0000 mL | Freq: Three times a day (TID) | ORAL | Status: DC | PRN
  Administered 2024-07-28 – 2024-08-02 (×16): 10 mL via ORAL
  Filled 2024-07-28 (×11): qty 10

## 2024-07-28 MED ORDER — HYDRALAZINE HCL 20 MG/ML IJ SOLN: 10.0000 mg | INTRAMUSCULAR | Status: DC | PRN

## 2024-07-28 MED ORDER — BISACODYL 5 MG PO TBEC
10.0000 mg | DELAYED_RELEASE_TABLET | Freq: Once | ORAL | Status: AC
  Administered 2024-07-28 (×2): 10 mg via ORAL
  Filled 2024-07-28: qty 2

## 2024-07-28 MED ORDER — FREE WATER: 250.0000 mL | Freq: Once | Status: DC

## 2024-07-28 MED ORDER — LIDOCAINE HCL (PF) 1 % IJ SOLN
INTRAMUSCULAR | Status: AC
  Filled 2024-07-28: qty 30

## 2024-07-28 MED ORDER — DIGOXIN 125 MCG PO TABS
0.1250 mg | ORAL_TABLET | Freq: Every day | ORAL | Status: DC
  Administered 2024-07-28 – 2024-08-03 (×14): 0.125 mg via ORAL
  Filled 2024-07-28 (×7): qty 1

## 2024-07-28 MED ORDER — SENNA 8.6 MG PO TABS
2.0000 | ORAL_TABLET | Freq: Once | ORAL | Status: AC
  Administered 2024-07-28 (×2): 17.2 mg via ORAL
  Filled 2024-07-28 (×2): qty 2

## 2024-07-28 MED ORDER — ENOXAPARIN SODIUM 30 MG/0.3ML IJ SOSY: 30.0000 mg | PREFILLED_SYRINGE | Freq: Two times a day (BID) | INTRAMUSCULAR | Status: DC

## 2024-07-28 MED ORDER — FUROSEMIDE 10 MG/ML IJ SOLN
80.0000 mg | Freq: Two times a day (BID) | INTRAMUSCULAR | Status: AC
  Administered 2024-07-28 – 2024-07-30 (×10): 80 mg via INTRAVENOUS
  Filled 2024-07-28 (×5): qty 8

## 2024-07-28 MED ORDER — ENOXAPARIN SODIUM 40 MG/0.4ML IJ SOSY: 40.0000 mg | PREFILLED_SYRINGE | Freq: Two times a day (BID) | INTRAMUSCULAR | Status: DC

## 2024-07-28 MED ORDER — SODIUM CHLORIDE 0.9% FLUSH
3.0000 mL | Freq: Two times a day (BID) | INTRAVENOUS | Status: DC
  Administered 2024-07-28 – 2024-08-03 (×22): 3 mL via INTRAVENOUS

## 2024-07-28 MED ORDER — MILRINONE LACTATE IN DEXTROSE 20-5 MG/100ML-% IV SOLN
INTRAVENOUS | Status: AC
  Filled 2024-07-28: qty 100

## 2024-07-28 MED ORDER — LIDOCAINE HCL (PF) 1 % IJ SOLN
INTRAMUSCULAR | Status: DC | PRN
  Administered 2024-07-28 (×2): 10 mL via INTRADERMAL

## 2024-07-28 MED ORDER — POLYETHYLENE GLYCOL 3350 17 G PO PACK
17.0000 g | PACK | Freq: Two times a day (BID) | ORAL | Status: DC
  Administered 2024-07-28 – 2024-08-02 (×12): 17 g via ORAL
  Filled 2024-07-28 (×11): qty 1

## 2024-07-28 MED ORDER — LABETALOL HCL 5 MG/ML IV SOLN: 10.0000 mg | INTRAVENOUS | Status: DC | PRN

## 2024-07-28 SURGICAL SUPPLY — 7 items
CATH SWAN GANZ 7F STRAIGHT (CATHETERS) IMPLANT
GLIDESHEATH SLENDER 7FR .021G (SHEATH) IMPLANT
KIT MICROPUNCTURE NIT STIFF (SHEATH) IMPLANT
PACK CARDIAC CATHETERIZATION (CUSTOM PROCEDURE TRAY) ×2 IMPLANT
SHEATH PINNACLE 5F 10CM (SHEATH) IMPLANT
TRANSDUCER W/STOPCOCK (MISCELLANEOUS) IMPLANT
TUBING ART PRESS 72 MALE/FEM (TUBING) IMPLANT

## 2024-07-28 NOTE — Progress Notes (Signed)
 IV Team notified that pt PICC line will not be place tonight, Cardiology oncall Dr. Melia informed.

## 2024-07-28 NOTE — Progress Notes (Signed)
 Brief Note  Mr Shurley had RHC this afternoon w/ RA 22, mPA 41, PCWP 30, CI 1.5-1.8, PVR 3.0 and PAPI 0.6. He was started on milrinone and his lasix was increased. Plan to place PICC and CHF will formally follow.   Joelle DEL. Ren Ny, MD
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
## 2024-07-28 NOTE — H&P (View-Only) (Signed)
 Informed Consent   Shared Decision Making/Informed Consent The risks, including but not limited to, [bleeding or vascular complications (1 in 500), pneumothorax (1 in 1600), arrhythmia (1 in 1000) and death (1 in 5000)], benefits (diagnostic support and/or management of heart failure, pulmonary hypertension) and alternatives of a right heart catheterization were discussed in detail with Mr. Devin [name] and he is willing to proceed.     Joelle DEL. Ren Ny, MD

## 2024-07-28 NOTE — Progress Notes (Signed)
 Pt off the unit to have heart cath procedure.
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
## 2024-07-28 NOTE — Interval H&P Note (Signed)
 History and Physical Interval Note:  07/28/2024 2:16 PM  Devin [name]  has presented today for surgery, with the diagnosis of chf.  The various methods of treatment have been discussed with the patient and family. After consideration of risks, benefits and other options for treatment, the patient has consented to  Procedure(s): RIGHT HEART CATH (N/A) as a surgical intervention.  The patient's history has been reviewed, patient examined, no change in status, stable for surgery.  I have reviewed the patient's chart and labs.  Questions were answered to the patient's satisfaction.     Vi Whitesel
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
## 2024-07-28 NOTE — Progress Notes (Signed)
 Informed Consent   Shared Decision Making/Informed Consent The risks, including but not limited to, [bleeding or vascular complications (1 in 500), pneumothorax (1 in 1600), arrhythmia (1 in 1000) and death (1 in 5000)], benefits (diagnostic support and/or management of heart failure, pulmonary hypertension) and alternatives of a right heart catheterization were discussed in detail with Mr. Devin [name] and he is willing to proceed.     Joelle DEL. Ren Ny, MD
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
## 2024-07-28 NOTE — Progress Notes (Signed)
   07/28/24 1635  Vitals  Temp 97.6 F (36.4 C)  Temp Source Oral  BP (!) 133/104  MAP (mmHg) 114  BP Location Left Arm  BP Method Automatic  Patient Position (if appropriate) Lying  Pulse Rate (!) 114  Pulse Rate Source Monitor  ECG Heart Rate (!) 114  Resp (!) 24  Level of Consciousness  Level of Consciousness Alert  MEWS COLOR  MEWS Score Color Yellow  Oxygen Therapy  SpO2 97 %  O2 Device Nasal Cannula  O2 Flow Rate (L/min) 3 L/min  MEWS Score  MEWS Temp 0  MEWS Systolic 0  MEWS Pulse 2  MEWS RR 1  MEWS LOC 0  MEWS Score 3   Pt back on unit, a&o x4, VSS, new IV in right upper arm with milrirone running. Arrived on 5L O2 [place], decreased to 3L sating at 97%. Pt has pressure bandage to right upper arm, no bleeding or drainage on bandage, no swelling or bruising around bandage.
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
## 2024-07-28 NOTE — Progress Notes (Signed)
..  Trauma Event Note    Reason for Call :   Received call from primary RN seeking advice regarding pt's c/o oral discomfort related to existing blisters in mouth. Per Dr. Polly ok to order MM. VO entered. Primary RN updated.   Last imported Vital Signs BP (!) 127/100 (BP Location: Right Arm)   Pulse (!) 110   Temp 97.6 F (36.4 C) (Oral)   Resp 20   Ht 6' (1.829 m)   Wt 219 lb 2.2 oz (99.4 kg)   SpO2 95%   BMI 29.72 kg/m   Trending CBC Recent Labs    07/25/24 1514 07/27/24 1201 07/28/24 0251  WBC 7.8 5.7 6.3  HGB 14.2 12.4* 12.5*  HCT 45.9 39.9 40.0  PLT 86* 97* 106*    Trending Coag's No results for input(s): APTT, INR in the last 72 hours.  Trending BMET Recent Labs    07/26/24 0517 07/27/24 1931 07/28/24 0251  NA 133* 131* 129*  K 3.6 4.6 4.3  CL 98 95* 96*  CO2 25 29 28   BUN 16 20 23   CREATININE 1.19 1.13 1.00  GLUCOSE 200* 109* 151*      Renisha Cockrum Dee  Trauma Response RN  Please call TRN at [phone] for further assistance.
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
## 2024-07-29 ENCOUNTER — Other Ambulatory Visit (HOSPITAL_COMMUNITY): Payer: Self-pay

## 2024-07-29 ENCOUNTER — Encounter (HOSPITAL_COMMUNITY): Payer: Self-pay | Admitting: Internal Medicine

## 2024-07-29 DIAGNOSIS — F1910 Other psychoactive substance abuse, uncomplicated: Secondary | ICD-10-CM

## 2024-07-29 DIAGNOSIS — Z21 Asymptomatic human immunodeficiency virus [HIV] infection status: Secondary | ICD-10-CM

## 2024-07-29 DIAGNOSIS — J449 Chronic obstructive pulmonary disease, unspecified: Secondary | ICD-10-CM

## 2024-07-29 DIAGNOSIS — Z86711 Personal history of pulmonary embolism: Secondary | ICD-10-CM

## 2024-07-29 DIAGNOSIS — I5023 Acute on chronic systolic (congestive) heart failure: Secondary | ICD-10-CM

## 2024-07-29 DIAGNOSIS — J439 Emphysema, unspecified: Secondary | ICD-10-CM

## 2024-07-29 LAB — POCT I-STAT EG7
Acid-Base Excess: 4 mmol/L — ABNORMAL HIGH (ref 0.0–2.0)
Acid-Base Excess: 6 mmol/L — ABNORMAL HIGH (ref 0.0–2.0)
Bicarbonate: 29.9 mmol/L — ABNORMAL HIGH (ref 20.0–28.0)
Bicarbonate: 32.5 mmol/L — ABNORMAL HIGH (ref 20.0–28.0)
Calcium, Ion: 1.12 mmol/L — ABNORMAL LOW (ref 1.15–1.40)
Calcium, Ion: 1.14 mmol/L — ABNORMAL LOW (ref 1.15–1.40)
HCT: 46 % (ref 39.0–52.0)
HCT: 47 % (ref 39.0–52.0)
Hemoglobin: 15.6 g/dL (ref 13.0–17.0)
Hemoglobin: 16 g/dL (ref 13.0–17.0)
O2 Saturation: 43 %
O2 Saturation: 46 %
Potassium: 5.3 mmol/L — ABNORMAL HIGH (ref 3.5–5.1)
Potassium: 5.4 mmol/L — ABNORMAL HIGH (ref 3.5–5.1)
Sodium: 134 mmol/L — ABNORMAL LOW (ref 135–145)
Sodium: 134 mmol/L — ABNORMAL LOW (ref 135–145)
TCO2: 31 mmol/L (ref 22–32)
TCO2: 34 mmol/L — ABNORMAL HIGH (ref 22–32)
pCO2, Ven: 49.3 mmHg (ref 44–60)
pCO2, Ven: 51.5 mmHg (ref 44–60)
pH, Ven: 7.391 (ref 7.25–7.43)
pH, Ven: 7.409 (ref 7.25–7.43)
pO2, Ven: 25 mmHg — CL (ref 32–45)
pO2, Ven: 26 mmHg — CL (ref 32–45)

## 2024-07-29 LAB — BASIC METABOLIC PANEL WITH GFR
Anion gap: 9 (ref 5–15)
Anion gap: 10 (ref 5–15)
BUN: 30 mg/dL — ABNORMAL HIGH (ref 8–23)
BUN: 28 mg/dL — ABNORMAL HIGH (ref 8–23)
CO2: 30 mmol/L (ref 22–32)
CO2: 32 mmol/L (ref 22–32)
Calcium: 8.1 mg/dL — ABNORMAL LOW (ref 8.9–10.3)
Calcium: 8.1 mg/dL — ABNORMAL LOW (ref 8.9–10.3)
Chloride: 93 mmol/L — ABNORMAL LOW (ref 98–111)
Chloride: 91 mmol/L — ABNORMAL LOW (ref 98–111)
Creatinine, Ser: 1.32 mg/dL — ABNORMAL HIGH (ref 0.61–1.24)
Creatinine, Ser: 1.32 mg/dL — ABNORMAL HIGH (ref 0.61–1.24)
GFR, Estimated: >60 mL/min (ref >60)
GFR, Estimated: >60 mL/min (ref >60)
Glucose, Bld: 140 mg/dL — ABNORMAL HIGH (ref 70–99)
Glucose, Bld: 133 mg/dL — ABNORMAL HIGH (ref 70–99)
Potassium: 4.6 mmol/L (ref 3.5–5.1)
Potassium: 4.3 mmol/L (ref 3.5–5.1)
Sodium: 132 mmol/L — ABNORMAL LOW (ref 135–145)
Sodium: 133 mmol/L — ABNORMAL LOW (ref 135–145)

## 2024-07-29 LAB — CBC
HCT: 41.1 % (ref 39.0–52.0)
Hemoglobin: 12.9 g/dL — ABNORMAL LOW (ref 13.0–17.0)
MCH: 29.4 pg (ref 26.0–34.0)
MCHC: 31.4 g/dL (ref 30.0–36.0)
MCV: 93.6 fL (ref 80.0–100.0)
Platelets: 135 K/uL — ABNORMAL LOW (ref 150–400)
RBC: 4.39 MIL/uL (ref 4.22–5.81)
RDW: 15.6 % — ABNORMAL HIGH (ref 11.5–15.5)
WBC: 9.1 K/uL (ref 4.0–10.5)
nRBC: 0 % (ref 0.0–0.2)

## 2024-07-29 LAB — COOXEMETRY PANEL
Carboxyhemoglobin: 2.1 % — ABNORMAL HIGH (ref 0.5–1.5)
Methemoglobin: 0.7 % (ref 0.0–1.5)
O2 Saturation: 79.1 %
Total hemoglobin: 12.9 g/dL (ref 12.0–16.0)

## 2024-07-29 LAB — GLUCOSE, CAPILLARY
Glucose-Capillary: 154 mg/dL — ABNORMAL HIGH (ref 70–99)
Glucose-Capillary: 155 mg/dL — ABNORMAL HIGH (ref 70–99)
Glucose-Capillary: 132 mg/dL — ABNORMAL HIGH (ref 70–99)
Glucose-Capillary: 181 mg/dL — ABNORMAL HIGH (ref 70–99)

## 2024-07-29 LAB — MAGNESIUM: Magnesium: 1.7 mg/dL (ref 1.7–2.4)

## 2024-07-29 MED ORDER — SPIRONOLACTONE 25 MG PO TABS
25.0000 mg | ORAL_TABLET | Freq: Every day | ORAL | Status: DC
  Administered 2024-07-29 – 2024-08-03 (×12): 25 mg via ORAL
  Filled 2024-07-29 (×6): qty 1

## 2024-07-29 MED ORDER — SODIUM CHLORIDE 0.9% FLUSH: 10.0000 mL | INTRAVENOUS | Status: DC | PRN

## 2024-07-29 MED ORDER — POTASSIUM CHLORIDE CRYS ER 20 MEQ PO TBCR
20.0000 meq | EXTENDED_RELEASE_TABLET | Freq: Once | ORAL | Status: AC
  Administered 2024-07-29 (×2): 20 meq via ORAL
  Filled 2024-07-29: qty 1

## 2024-07-29 MED ORDER — METOLAZONE 2.5 MG PO TABS
2.5000 mg | ORAL_TABLET | Freq: Once | ORAL | Status: AC
  Administered 2024-07-29 (×2): 2.5 mg via ORAL
  Filled 2024-07-29: qty 1

## 2024-07-29 MED ORDER — MAGNESIUM SULFATE 4 GM/100ML IV SOLN
4.0000 g | Freq: Once | INTRAVENOUS | Status: AC
  Administered 2024-07-29 (×2): 4 g via INTRAVENOUS
  Filled 2024-07-29: qty 100

## 2024-07-29 MED ORDER — OLANZAPINE 5 MG PO TABS
5.0000 mg | ORAL_TABLET | Freq: Every day | ORAL | Status: DC
  Administered 2024-07-29 – 2024-08-02 (×10): 5 mg via ORAL
  Filled 2024-07-29 (×6): qty 1

## 2024-07-29 MED ORDER — SODIUM CHLORIDE 0.9% FLUSH
10.0000 mL | Freq: Two times a day (BID) | INTRAVENOUS | Status: DC
  Administered 2024-07-29 – 2024-07-31 (×7): 10 mL
  Administered 2024-07-31: 20 mL
  Administered 2024-07-31: 10 mL
  Administered 2024-07-31 – 2024-08-01 (×2): 20 mL
  Administered 2024-08-01: 10 mL
  Administered 2024-08-01: 20 mL
  Administered 2024-08-01 – 2024-08-03 (×7): 10 mL
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
## 2024-07-29 NOTE — Progress Notes (Signed)
 SLP Cancellation Note  Patient Details Name: Tiron Batson MRN: [id number] DOB: [birth year]   Cancelled treatment:       Reason Eval/Treat Not Completed: Patient unavailable. Attempted to see pt earlier but he was receiving nursing care. Will f/u as able.     Leita SAILOR., M.A. CCC-SLP Acute Rehabilitation Services Office: [phone]  Secure chat preferred 07/29/2024, 2:44 PM

## 2024-07-29 NOTE — Progress Notes (Signed)
 Peripherally Inserted Central Catheter Placement  The IV Nurse has discussed with the patient and/or persons authorized to consent for the patient, the purpose of this procedure and the potential benefits and risks involved with this procedure.  The benefits include less needle sticks, lab draws from the catheter, and the patient may be discharged home with the catheter. Risks include, but not limited to, infection, bleeding, blood clot (thrombus formation), and puncture of an artery; nerve damage and irregular heartbeat and possibility to perform a PICC exchange if needed/ordered by physician.  Alternatives to this procedure were also discussed.  Bard Power PICC patient education guide, fact sheet on infection prevention and patient information card has been provided to patient /or left at bedside.    PICC Placement Documentation  PICC Double Lumen 07/29/24 Right Brachial 38 cm 0 cm (Active)  Indication for Insertion or Continuance of Line Vasoactive infusions 07/29/24 1042  Exposed Catheter (cm) 0 cm 07/29/24 1042  Site Assessment Clean, Dry, Intact 07/29/24 1042  Lumen #1 Status Flushed;Saline locked;Blood return noted 07/29/24 1042  Lumen #2 Status Flushed;Saline locked;Blood return noted 07/29/24 1042  Dressing Type Transparent;Securing device 07/29/24 1042  Dressing Status Antimicrobial disc/dressing in place;Clean, Dry, Intact 07/29/24 1042  Line Care Connections checked and tightened 07/29/24 1042  Line Adjustment (NICU/IV Team Only) No 07/29/24 1042  Dressing Intervention New dressing;Adhesive placed at insertion site (IV team only) 07/29/24 1042  Dressing Change Due 08/05/24 07/29/24 1042       Ahmiya Abee 07/29/2024, 10:46 AM

## 2024-07-29 NOTE — Plan of Care (Addendum)
 [id number]- Patient refused change of dressing on bilateral lower extremities and requested to do it after breakfast. Patient education provided. Continue to provide care per plan. Safety precautions in place.    Problem: Clinical Measurements: Goal: Will remain free from infection Outcome: Progressing Goal: Diagnostic test results will improve Outcome: Progressing Goal: Respiratory complications will improve Outcome: Progressing   Problem: Activity: Goal: Risk for activity intolerance will decrease Outcome: Progressing   Problem: Elimination: Goal: Will not experience complications related to urinary retention Outcome: Progressing   Problem: Safety: Goal: Ability to remain free from injury will improve Outcome: Progressing   Problem: Tissue Perfusion: Goal: Adequacy of tissue perfusion will improve Outcome: Progressing   Problem: Cardiovascular: Goal: Vascular access site(s) Level 0-1 will be maintained Outcome: Progressing

## 2024-07-29 NOTE — Assessment & Plan Note (Signed)
Continue anticoagulation with apixaban.  ?

## 2024-07-29 NOTE — Assessment & Plan Note (Signed)
Old records personally reviewed, neurology follow up from 07/2021. Seropositive ocular myasthenia gravis, no significant bulbar, or limb weakness noted.  He is not longer on Mestinon or prednisone, never treated with long term steroids sparing agent.   
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
## 2024-07-29 NOTE — Progress Notes (Addendum)
   07/29/24 0031  Assess: MEWS Score  Temp 98 F (36.7 C)  BP 100/66  MAP (mmHg) 77  Pulse Rate (!) 110  ECG Heart Rate (!) 109  Resp 18  Level of Consciousness Alert  SpO2 100 %  O2 Device Nasal Cannula  O2 Flow Rate (L/min) 4 L/min  Assess: MEWS Score  MEWS Temp 0  MEWS Systolic 1  MEWS Pulse 1  MEWS RR 0  MEWS LOC 0  MEWS Score 2  MEWS Score Color Yellow  Assess: if the MEWS score is Yellow or Red  Were vital signs accurate and taken at a resting state? Yes  Does the patient meet 2 or more of the SIRS criteria? No  MEWS guidelines implemented  No, previously yellow, continue vital signs every 4 hours  Notify: Charge Nurse/RN  Name of Charge Nurse/RN Notified Matthew, RN  Assess: SIRS CRITERIA  SIRS Temperature  0  SIRS Respirations  0  SIRS Pulse 1  SIRS WBC 0  SIRS Score Sum  1   Patient has been yellow MEWS since transferred HR ranges 110-120's, denies chest pain, shortness of breath and palpitations.Alert and oriented, follows command.  Post heart catheterization, for PICC insertion in AM. Continue to provide care per plan. Safety precautions in place.

## 2024-07-29 NOTE — Assessment & Plan Note (Signed)
 Subdural hematoma, not operative per neurosurgery Continue supportive medical care. As needed analgesics.

## 2024-07-29 NOTE — Progress Notes (Signed)
 Trauma Event Note   ITSS completed -- scored 3 on PTSD and 3 on depression scale-- will request a psyc consult order from TMD.   Pt is dozing in bed currently, cooperative, conversational -- requesting a shower today.   Last imported Vital Signs BP 96/64 (BP Location: Right Arm)   Pulse (!) 106   Temp (!) 97.5 F (36.4 C) (Oral)   Resp 20   Ht 6' 3 (1.905 Devin)   Wt 215 lb 2.7 oz (97.6 kg)   SpO2 99%   BMI 26.89 kg/Devin   Trending CBC Recent Labs    07/27/24 1201 07/28/24 0251 07/29/24 0245  WBC 5.7 6.3 9.1  HGB 12.4* 12.5* 12.9*  HCT 39.9 40.0 41.1  PLT 97* 106* 135*    Trending Coag's No results for input(s): APTT, INR in the last 72 hours.  Trending BMET Recent Labs    07/27/24 1931 07/28/24 0251 07/29/24 0245  NA 131* 129* 132*  K 4.6 4.3 4.6  CL 95* 96* 93*  CO2 29 28 30   BUN 20 23 30*  CREATININE 1.13 1.00 1.32*  GLUCOSE 109* 151* 140*      Devin [name] Devin [name]  Trauma Response RN  Please call TRN at [phone] for further assistance.

## 2024-07-29 NOTE — Assessment & Plan Note (Addendum)
 Echocardiogram with reduced LV systolic function EF 10 to 15%, global hypokinesis, large broad based protuberant and slightly mobile thrombus in the the LV apex, 14 x 14 x 12 mm. RV systolic function with severe reduction, RV with moderate enlargement, RVSP 46.1 mmHg. LA and RA with severe dilatation, severe tricuspid valve regurgitation and mild mitral valve regurgitation.   Urine output is 4525 ml Systolic blood pressure 100  mmHg range  Decreasing inotropic support with milrinone 0.125 mcg/kg/min 10/02 Metolazone 2.5 mg x1   Continue digoxin  and spironolactone .  Anticoagulation for LV thrombus  Afterload reduction with entresto .  Diuresis with torsemide  20 mg po daily.

## 2024-07-29 NOTE — Assessment & Plan Note (Addendum)
 Methamphetamine induced psychotic disorder, cocaine induced psychotic disorder.  Continue with buspirone, duloxetine  and olanzapine , follow up with psychiatry as outpatient.

## 2024-07-29 NOTE — Assessment & Plan Note (Addendum)
 Follow up with ID as outpatient.  His HIV 1 RNS was < 20
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
## 2024-07-29 NOTE — Progress Notes (Incomplete)
 Biktarvy - refills, [facility]

## 2024-07-30 DIAGNOSIS — Z86711 Personal history of pulmonary embolism: Secondary | ICD-10-CM | POA: Diagnosis not present

## 2024-07-30 DIAGNOSIS — J439 Emphysema, unspecified: Secondary | ICD-10-CM | POA: Diagnosis not present

## 2024-07-30 DIAGNOSIS — N179 Acute kidney failure, unspecified: Secondary | ICD-10-CM

## 2024-07-30 DIAGNOSIS — I5023 Acute on chronic systolic (congestive) heart failure: Secondary | ICD-10-CM | POA: Diagnosis not present

## 2024-07-30 LAB — BASIC METABOLIC PANEL WITH GFR
Anion gap: 8 (ref 5–15)
BUN: 25 mg/dL — ABNORMAL HIGH (ref 8–23)
CO2: 37 mmol/L — ABNORMAL HIGH (ref 22–32)
Calcium: 8.4 mg/dL — ABNORMAL LOW (ref 8.9–10.3)
Chloride: 85 mmol/L — ABNORMAL LOW (ref 98–111)
Creatinine, Ser: 1.21 mg/dL (ref 0.61–1.24)
GFR, Estimated: >60 mL/min (ref >60)
Glucose, Bld: 151 mg/dL — ABNORMAL HIGH (ref 70–99)
Potassium: 4 mmol/L (ref 3.5–5.1)
Sodium: 130 mmol/L — ABNORMAL LOW (ref 135–145)

## 2024-07-30 LAB — COOXEMETRY PANEL
Carboxyhemoglobin: 2.2 % — ABNORMAL HIGH (ref 0.5–1.5)
Methemoglobin: <0.7 % (ref 0.0–1.5)
O2 Saturation: 75 %
Total hemoglobin: 12.5 g/dL (ref 12.0–16.0)

## 2024-07-30 LAB — GLUCOSE, CAPILLARY
Glucose-Capillary: 157 mg/dL — ABNORMAL HIGH (ref 70–99)
Glucose-Capillary: 139 mg/dL — ABNORMAL HIGH (ref 70–99)
Glucose-Capillary: 154 mg/dL — ABNORMAL HIGH (ref 70–99)
Glucose-Capillary: 158 mg/dL — ABNORMAL HIGH (ref 70–99)

## 2024-07-30 LAB — MAGNESIUM: Magnesium: 1.8 mg/dL (ref 1.7–2.4)

## 2024-07-30 MED ORDER — SACUBITRIL-VALSARTAN 24-26 MG PO TABS
1.0000 | ORAL_TABLET | Freq: Two times a day (BID) | ORAL | Status: DC
  Administered 2024-07-30 – 2024-08-03 (×18): 1 via ORAL
  Filled 2024-07-30 (×9): qty 1

## 2024-07-30 MED ORDER — ACETAZOLAMIDE 250 MG PO TABS
500.0000 mg | ORAL_TABLET | Freq: Two times a day (BID) | ORAL | Status: AC
  Administered 2024-07-30 (×4): 500 mg via ORAL
  Filled 2024-07-30 (×2): qty 2

## 2024-07-30 MED ORDER — MAGNESIUM SULFATE 4 GM/100ML IV SOLN
4.0000 g | Freq: Once | INTRAVENOUS | Status: AC
  Administered 2024-07-30 (×2): 4 g via INTRAVENOUS
  Filled 2024-07-30: qty 100

## 2024-07-30 MED ORDER — LIDOCAINE VISCOUS HCL 2 % MT SOLN: 15.0000 mL | Freq: Four times a day (QID) | OROMUCOSAL | Status: DC | PRN

## 2024-07-30 NOTE — Plan of Care (Signed)
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
## 2024-07-30 NOTE — Assessment & Plan Note (Addendum)
 Hyponatremia.   Today renal function with serum cr at 1,15 with K at 4.2 and serum bicarbonate at 23  Na 125 and Mg at 1,9   Add 2 g mag sulfate to avoid hypomagnesemia.  Follow up renal function and electrolytes in am.  Diuresis with torsemide  20 mg po daily,

## 2024-07-31 ENCOUNTER — Inpatient Hospital Stay (HOSPITAL_COMMUNITY)

## 2024-07-31 DIAGNOSIS — N179 Acute kidney failure, unspecified: Secondary | ICD-10-CM | POA: Diagnosis not present

## 2024-07-31 DIAGNOSIS — I5023 Acute on chronic systolic (congestive) heart failure: Secondary | ICD-10-CM | POA: Diagnosis not present

## 2024-07-31 DIAGNOSIS — Z86711 Personal history of pulmonary embolism: Secondary | ICD-10-CM | POA: Diagnosis not present

## 2024-07-31 LAB — BASIC METABOLIC PANEL WITH GFR
Anion gap: 10 (ref 5–15)
BUN: 27 mg/dL — ABNORMAL HIGH (ref 8–23)
CO2: 30 mmol/L (ref 22–32)
Calcium: 8 mg/dL — ABNORMAL LOW (ref 8.9–10.3)
Chloride: 85 mmol/L — ABNORMAL LOW (ref 98–111)
Creatinine, Ser: 1.33 mg/dL — ABNORMAL HIGH (ref 0.61–1.24)
GFR, Estimated: >60 mL/min (ref >60)
Glucose, Bld: 186 mg/dL — ABNORMAL HIGH (ref 70–99)
Potassium: 4.1 mmol/L (ref 3.5–5.1)
Sodium: 125 mmol/L — ABNORMAL LOW (ref 135–145)

## 2024-07-31 LAB — CBC
HCT: 43.2 % (ref 39.0–52.0)
Hemoglobin: 13.7 g/dL (ref 13.0–17.0)
MCH: 29.1 pg (ref 26.0–34.0)
MCHC: 31.7 g/dL (ref 30.0–36.0)
MCV: 91.9 fL (ref 80.0–100.0)
Platelets: 171 K/uL (ref 150–400)
RBC: 4.7 MIL/uL (ref 4.22–5.81)
RDW: 14.8 % (ref 11.5–15.5)
WBC: 7.9 K/uL (ref 4.0–10.5)
nRBC: 0 % (ref 0.0–0.2)

## 2024-07-31 LAB — MAGNESIUM: Magnesium: 1.8 mg/dL (ref 1.7–2.4)

## 2024-07-31 LAB — COOXEMETRY PANEL
Carboxyhemoglobin: 1.7 % — ABNORMAL HIGH (ref 0.5–1.5)
Methemoglobin: 0.9 % (ref 0.0–1.5)
O2 Saturation: 67.6 %
Total hemoglobin: 14.1 g/dL (ref 12.0–16.0)

## 2024-07-31 LAB — URINALYSIS, ROUTINE W REFLEX MICROSCOPIC
Bilirubin Urine: NEGATIVE
Glucose, UA: NEGATIVE mg/dL
Hgb urine dipstick: NEGATIVE
Ketones, ur: NEGATIVE mg/dL
Leukocytes,Ua: NEGATIVE
Nitrite: NEGATIVE
Protein, ur: NEGATIVE mg/dL
Specific Gravity, Urine: 1.011 (ref 1.005–1.030)
pH: 9 — ABNORMAL HIGH (ref 5.0–8.0)

## 2024-07-31 LAB — GLUCOSE, CAPILLARY
Glucose-Capillary: 143 mg/dL — ABNORMAL HIGH (ref 70–99)
Glucose-Capillary: 199 mg/dL — ABNORMAL HIGH (ref 70–99)
Glucose-Capillary: 125 mg/dL — ABNORMAL HIGH (ref 70–99)
Glucose-Capillary: 168 mg/dL — ABNORMAL HIGH (ref 70–99)
Glucose-Capillary: 108 mg/dL — ABNORMAL HIGH (ref 70–99)

## 2024-07-31 MED ORDER — TRAMADOL HCL 50 MG PO TABS
50.0000 mg | ORAL_TABLET | Freq: Once | ORAL | Status: AC
  Administered 2024-07-31 (×2): 50 mg via ORAL
  Filled 2024-07-31: qty 1

## 2024-07-31 MED ORDER — TORSEMIDE 20 MG PO TABS
20.0000 mg | ORAL_TABLET | Freq: Every day | ORAL | Status: DC
  Administered 2024-07-31 – 2024-08-03 (×8): 20 mg via ORAL
  Filled 2024-07-31 (×4): qty 1

## 2024-07-31 MED ORDER — MAGNESIUM SULFATE 2 GM/50ML IV SOLN
2.0000 g | Freq: Once | INTRAVENOUS | Status: AC
  Administered 2024-07-31 (×2): 2 g via INTRAVENOUS
  Filled 2024-07-31: qty 50

## 2024-07-31 NOTE — Progress Notes (Signed)
   07/31/24 1721  Vitals  Temp 97.6 F (36.4 C)  Temp Source Oral  BP 107/78  MAP (mmHg) 88  BP Location Left Arm  BP Method Automatic  Patient Position (if appropriate) Lying  Pulse Rate (!) 115  Pulse Rate Source Monitor  Level of Consciousness  Level of Consciousness Alert  MEWS COLOR  MEWS Score Color Yellow  Oxygen Therapy  SpO2 99 %  O2 Device Room Air  Pain Assessment  Pain Scale 0-10  Pain Score 8  Pain Location Back  Pain Intervention(s) Medication (See eMAR)  MEWS Score  MEWS Temp 0  MEWS Systolic 0  MEWS Pulse 2  MEWS RR 0  MEWS LOC 0  MEWS Score 2   Chronic mews
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
## 2024-07-31 NOTE — Progress Notes (Signed)
   07/31/24 1000  Assess: MEWS Score  Temp 97.7 F (36.5 C)  BP 108/82  MAP (mmHg) 91  Pulse Rate (!) 115  ECG Heart Rate (!) 114  Resp 19  SpO2 93 %  O2 Device Room Air  Assess: MEWS Score  MEWS Temp 0  MEWS Systolic 0  MEWS Pulse 2  MEWS RR 0  MEWS LOC 0  MEWS Score 2  MEWS Score Color Yellow  Assess: if the MEWS score is Yellow or Red  Were vital signs accurate and taken at a resting state? Yes  Does the patient meet 2 or more of the SIRS criteria? No  MEWS guidelines implemented  No, previously yellow, continue vital signs every 4 hours  Assess: SIRS CRITERIA  SIRS Temperature  0  SIRS Respirations  0  SIRS Pulse 1  SIRS WBC 0  SIRS Score Sum  1   Chronic yellow [facility] ST.
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
## 2024-07-31 NOTE — Plan of Care (Signed)
   Problem: Health Behavior/Discharge Planning: Goal: Ability to manage health-related needs will improve Outcome: Progressing   Problem: Clinical Measurements: Goal: Will remain free from infection Outcome: Progressing   Problem: Activity: Goal: Risk for activity intolerance will decrease Outcome: Progressing

## 2024-07-31 NOTE — Plan of Care (Signed)
  Problem: Cardiovascular: Goal: Vascular access site(s) Level 0-1 will be maintained Outcome: Progressing   Problem: Health Behavior/Discharge Planning: Goal: Ability to safely manage health-related needs after discharge will improve Outcome: Progressing   Problem: Health Behavior/Discharge Planning: Goal: Ability to manage health-related needs will improve Outcome: Not Progressing   Problem: Education: Goal: Individualized Educational Video(s) Outcome: Not Progressing   Problem: Health Behavior/Discharge Planning: Goal: Ability to manage health-related needs will improve Outcome: Not Progressing   Problem: Education: Goal: Individualized Educational Video(s) Outcome: Not Progressing
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
## 2024-07-31 NOTE — Plan of Care (Signed)
 CVP 8 Will change diuresis to torsemide  20 mg daily   Emeline Calender, DO

## 2024-08-01 DIAGNOSIS — Z86711 Personal history of pulmonary embolism: Secondary | ICD-10-CM | POA: Diagnosis not present

## 2024-08-01 DIAGNOSIS — I5023 Acute on chronic systolic (congestive) heart failure: Secondary | ICD-10-CM | POA: Diagnosis not present

## 2024-08-01 DIAGNOSIS — N179 Acute kidney failure, unspecified: Secondary | ICD-10-CM | POA: Diagnosis not present

## 2024-08-01 DIAGNOSIS — F1910 Other psychoactive substance abuse, uncomplicated: Secondary | ICD-10-CM | POA: Diagnosis not present

## 2024-08-01 DIAGNOSIS — I5021 Acute systolic (congestive) heart failure: Secondary | ICD-10-CM

## 2024-08-01 DIAGNOSIS — J449 Chronic obstructive pulmonary disease, unspecified: Secondary | ICD-10-CM

## 2024-08-01 DIAGNOSIS — B20 Human immunodeficiency virus [HIV] disease: Secondary | ICD-10-CM

## 2024-08-01 DIAGNOSIS — I5022 Chronic systolic (congestive) heart failure: Secondary | ICD-10-CM | POA: Diagnosis not present

## 2024-08-01 LAB — CBC
HCT: 46.6 % (ref 39.0–52.0)
Hemoglobin: 15.2 g/dL (ref 13.0–17.0)
MCH: 29.4 pg (ref 26.0–34.0)
MCHC: 32.6 g/dL (ref 30.0–36.0)
MCV: 90.1 fL (ref 80.0–100.0)
Platelets: 225 K/uL (ref 150–400)
RBC: 5.17 MIL/uL (ref 4.22–5.81)
RDW: 14.9 % (ref 11.5–15.5)
WBC: 8.2 K/uL (ref 4.0–10.5)
nRBC: 0 % (ref 0.0–0.2)

## 2024-08-01 LAB — BASIC METABOLIC PANEL WITH GFR
Anion gap: 10 (ref 5–15)
BUN: 30 mg/dL — ABNORMAL HIGH (ref 8–23)
CO2: 23 mmol/L (ref 22–32)
Calcium: 8.8 mg/dL — ABNORMAL LOW (ref 8.9–10.3)
Chloride: 92 mmol/L — ABNORMAL LOW (ref 98–111)
Creatinine, Ser: 1.15 mg/dL (ref 0.61–1.24)
GFR, Estimated: >60 mL/min (ref >60)
Glucose, Bld: 147 mg/dL — ABNORMAL HIGH (ref 70–99)
Potassium: 4.2 mmol/L (ref 3.5–5.1)
Sodium: 125 mmol/L — ABNORMAL LOW (ref 135–145)

## 2024-08-01 LAB — COOXEMETRY PANEL
Carboxyhemoglobin: 1.8 % — ABNORMAL HIGH (ref 0.5–1.5)
Carboxyhemoglobin: 2.5 % — ABNORMAL HIGH (ref 0.5–1.5)
Methemoglobin: 1.2 % (ref 0.0–1.5)
Methemoglobin: 0.7 % (ref 0.0–1.5)
O2 Saturation: 67.8 %
O2 Saturation: 76.7 %
Total hemoglobin: 15.4 g/dL (ref 12.0–16.0)
Total hemoglobin: 15.1 g/dL (ref 12.0–16.0)

## 2024-08-01 LAB — GLUCOSE, CAPILLARY
Glucose-Capillary: 154 mg/dL — ABNORMAL HIGH (ref 70–99)
Glucose-Capillary: 243 mg/dL — ABNORMAL HIGH (ref 70–99)
Glucose-Capillary: 215 mg/dL — ABNORMAL HIGH (ref 70–99)
Glucose-Capillary: 102 mg/dL — ABNORMAL HIGH (ref 70–99)
Glucose-Capillary: 223 mg/dL — ABNORMAL HIGH (ref 70–99)

## 2024-08-01 LAB — MAGNESIUM: Magnesium: 1.9 mg/dL (ref 1.7–2.4)

## 2024-08-01 MED ORDER — ACETAMINOPHEN 500 MG PO TABS
500.0000 mg | ORAL_TABLET | Freq: Four times a day (QID) | ORAL | Status: DC
  Administered 2024-08-01 – 2024-08-03 (×10): 500 mg via ORAL
  Filled 2024-08-01 (×6): qty 1

## 2024-08-01 MED ORDER — MILRINONE LACTATE IN DEXTROSE 20-5 MG/100ML-% IV SOLN: 0.1250 ug/kg/min | INTRAVENOUS | Status: DC
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
## 2024-08-01 NOTE — Plan of Care (Signed)

## 2024-08-01 NOTE — Plan of Care (Signed)
  Problem: Education: Goal: Knowledge of General Education information will improve Description: Including pain rating scale, medication(s)/side effects and non-pharmacologic comfort measures Outcome: Progressing   Problem: Clinical Measurements: Goal: Ability to maintain clinical measurements within normal limits will improve Outcome: Progressing   Problem: Nutrition: Goal: Adequate nutrition will be maintained Outcome: Progressing   Problem: Coping: Goal: Level of anxiety will decrease Outcome: Progressing   Problem: Pain Managment: Goal: General experience of comfort will improve and/or be controlled Outcome: Progressing   Problem: Safety: Goal: Ability to remain free from injury will improve Outcome: Progressing   Problem: Fluid Volume: Goal: Ability to maintain a balanced intake and output will improve Outcome: Progressing
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
## 2024-08-02 DIAGNOSIS — N179 Acute kidney failure, unspecified: Secondary | ICD-10-CM | POA: Diagnosis not present

## 2024-08-02 DIAGNOSIS — Z86711 Personal history of pulmonary embolism: Secondary | ICD-10-CM | POA: Diagnosis not present

## 2024-08-02 DIAGNOSIS — I5023 Acute on chronic systolic (congestive) heart failure: Secondary | ICD-10-CM | POA: Diagnosis not present

## 2024-08-02 LAB — BASIC METABOLIC PANEL WITH GFR
Anion gap: 8 (ref 5–15)
BUN: 29 mg/dL — ABNORMAL HIGH (ref 8–23)
CO2: 25 mmol/L (ref 22–32)
Calcium: 8.7 mg/dL — ABNORMAL LOW (ref 8.9–10.3)
Chloride: 94 mmol/L — ABNORMAL LOW (ref 98–111)
Creatinine, Ser: 1 mg/dL (ref 0.61–1.24)
GFR, Estimated: >60 mL/min (ref >60)
Glucose, Bld: 140 mg/dL — ABNORMAL HIGH (ref 70–99)
Potassium: 4.2 mmol/L (ref 3.5–5.1)
Sodium: 127 mmol/L — ABNORMAL LOW (ref 135–145)

## 2024-08-02 LAB — COOXEMETRY PANEL
Carboxyhemoglobin: 1.8 % — ABNORMAL HIGH (ref 0.5–1.5)
Carboxyhemoglobin: 1.6 % — ABNORMAL HIGH (ref 0.5–1.5)
Methemoglobin: 0.9 % (ref 0.0–1.5)
Methemoglobin: <0.7 % (ref 0.0–1.5)
O2 Saturation: 72.4 %
O2 Saturation: 70.6 %
Total hemoglobin: 15 g/dL (ref 12.0–16.0)
Total hemoglobin: 15.3 g/dL (ref 12.0–16.0)

## 2024-08-02 LAB — CBC
HCT: 44.4 % (ref 39.0–52.0)
Hemoglobin: 14.5 g/dL (ref 13.0–17.0)
MCH: 29.4 pg (ref 26.0–34.0)
MCHC: 32.7 g/dL (ref 30.0–36.0)
MCV: 90.1 fL (ref 80.0–100.0)
Platelets: 233 K/uL (ref 150–400)
RBC: 4.93 MIL/uL (ref 4.22–5.81)
RDW: 14.8 % (ref 11.5–15.5)
WBC: 6.6 K/uL (ref 4.0–10.5)
nRBC: 0 % (ref 0.0–0.2)

## 2024-08-02 LAB — GLUCOSE, CAPILLARY
Glucose-Capillary: 125 mg/dL — ABNORMAL HIGH (ref 70–99)
Glucose-Capillary: 174 mg/dL — ABNORMAL HIGH (ref 70–99)
Glucose-Capillary: 97 mg/dL (ref 70–99)
Glucose-Capillary: 139 mg/dL — ABNORMAL HIGH (ref 70–99)

## 2024-08-02 LAB — MAGNESIUM: Magnesium: 1.9 mg/dL (ref 1.7–2.4)

## 2024-08-02 MED ORDER — DAPAGLIFLOZIN PROPANEDIOL 10 MG PO TABS
10.0000 mg | ORAL_TABLET | Freq: Every day | ORAL | Status: DC
  Administered 2024-08-02 – 2024-08-03 (×4): 10 mg via ORAL
  Filled 2024-08-02 (×2): qty 1

## 2024-08-02 MED ORDER — MAGNESIUM SULFATE 2 GM/50ML IV SOLN
2.0000 g | Freq: Once | INTRAVENOUS | Status: AC
  Administered 2024-08-02 (×2): 2 g via INTRAVENOUS
  Filled 2024-08-02: qty 50

## 2024-08-02 NOTE — Plan of Care (Signed)
  Problem: Education: Goal: Knowledge of General Education information will improve Description: Including pain rating scale, medication(s)/side effects and non-pharmacologic comfort measures Outcome: Progressing   Problem: Health Behavior/Discharge Planning: Goal: Ability to manage health-related needs will improve Outcome: Progressing   Problem: Clinical Measurements: Goal: Ability to maintain clinical measurements within normal limits will improve Outcome: Progressing Goal: Will remain free from infection Outcome: Progressing Goal: Diagnostic test results will improve Outcome: Progressing Goal: Respiratory complications will improve Outcome: Progressing Goal: Cardiovascular complication will be avoided Outcome: Progressing   Problem: Activity: Goal: Risk for activity intolerance will decrease Outcome: Progressing   Problem: Nutrition: Goal: Adequate nutrition will be maintained Outcome: Progressing  Asking for lots of sugary foods gave teaching on controlling blood sugar.   Problem: Coping: Goal: Level of anxiety will decrease Outcome: Progressing   Problem: Elimination: Goal: Will not experience complications related to bowel motility Outcome: Progressing Goal: Will not experience complications related to urinary retention Outcome: Progressing   Problem: Pain Managment: Goal: General experience of comfort will improve and/or be controlled Outcome: Progressing   Problem: Safety: Goal: Ability to remain free from injury will improve Outcome: Progressing   Problem: Skin Integrity: Goal: Risk for impaired skin integrity will decrease Outcome: Progressing   Problem: Education: Goal: Ability to describe self-care measures that may prevent or decrease complications (Diabetes Survival Skills Education) will improve Outcome: Progressing Goal: Individualized Educational Video(s) Outcome: Progressing   Problem: Coping: Goal: Ability to adjust to condition or  change in health will improve Outcome: Progressing   Problem: Fluid Volume: Goal: Ability to maintain a balanced intake and output will improve Outcome: Progressing   Problem: Health Behavior/Discharge Planning: Goal: Ability to identify and utilize available resources and services will improve Outcome: Progressing Goal: Ability to manage health-related needs will improve Outcome: Progressing   Problem: Metabolic: Goal: Ability to maintain appropriate glucose levels will improve Outcome: Progressing   Problem: Nutritional: Goal: Maintenance of adequate nutrition will improve Outcome: Progressing Goal: Progress toward achieving an optimal weight will improve Outcome: Progressing   Problem: Skin Integrity: Goal: Risk for impaired skin integrity will decrease Outcome: Progressing   Problem: Tissue Perfusion: Goal: Adequacy of tissue perfusion will improve Outcome: Progressing   Problem: Education: Goal: Understanding of CV disease, CV risk reduction, and recovery process will improve Outcome: Progressing Goal: Individualized Educational Video(s) Outcome: Progressing   Problem: Activity: Goal: Ability to return to baseline activity level will improve Outcome: Progressing   Problem: Cardiovascular: Goal: Ability to achieve and maintain adequate cardiovascular perfusion will improve Outcome: Progressing Goal: Vascular access site(s) Level 0-1 will be maintained Outcome: Progressing   Problem: Health Behavior/Discharge Planning: Goal: Ability to safely manage health-related needs after discharge will improve Outcome: Progressing

## 2024-08-02 NOTE — Progress Notes (Signed)
 Pt mood has fluctuated through out the day. Anxious. And tearful, Refused dinner due to hard Chicken states that his son will bring him food.
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
## 2024-08-02 NOTE — Plan of Care (Signed)
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
## 2024-08-02 NOTE — Discharge Instructions (Addendum)
 [employer] assistance programs Crisis assistance programs  -[facility] Arts development officer. If you are experiencing homelessness in [place], [place] , your first point of contact should be Pensions consultant. You can reach Coordinated Entry by calling ([phone] [phone] or by emailing [email or web address] [email or web address].  Community access points: [employer] [phone] [address], [place]) every Tuesday from 9am-10am. [facility] ([address] [place]. [address], [place]) every Wednesday from 8am-9am.   -[place] Coordinated Re-entry Daniel Mcalpine: Dial 211 and request. Offers referrals to homeless shelters in the area.    -The [employer] (985)177-2422) offers several services to local families, as funding allows. The Emergency Assistance Program (EAP), which they administer, provides household goods, free food, clothing, and financial aid to people in need in the [place]  area. The EAP program does have some qualification, and counselors will interview clients for financial assistance by written referral only. Referrals need to be made by the Department of Social Services or by other EAP approved human services agencies or charities in the area.  -[facility] of [employer], which can be reached at ([phone], offers emergency assistance programs for those in need of help, such as food, rent assistance, a soup kitchen, shelter, and clothing. They are based in [facility] [place]  but provide a number of services to those that qualify for assistance.   [facility] Department of Social Services may be able to offer temporary financial assistance and cash grants for paying rent and utilities, Help may be provided for local county residents who may be experiencing personal crisis when other resources, including government programs, are not available. Call [phone]  -High [employer] is a [employer] agency, The organization can offer emergency assistance for paying rent, [employer] Rx, utilities, food, household products and furniture. They offer extensive emergency and transitional housing for families, children and single women, and also run a Boy's and [employer]. Thrift Shops, Secondary school teacher, and other aid offered too. [address], [place], [place]  [address], [phone]  -[place] Low Income Energy Assistance Program -- This is offered for [facility] families. The federal government created [employer] Program provides a one-time cash grant payment to help eligible low-income families pay their electric and heating bills. [address], [place], [place]  [address], 6414528338  -[place] Emergency Assistance -- A program offers emergency utility and rent funds for greater [employer] area residents. The program can also provide counseling and referrals to charities and government programs. Also provides food and a free meal program that serves lunch Mondays - Saturdays and dinner seven days per week to individuals in the community. [address], [place], [place]  [address], [phone]  -[employer] - Offers affordable apartment and housing communities across      [place] and [place]. The low income and seniors can access public housing, rental assistance to qualified applicants, and apply for the section 8 rent subsidy program. Other programs include Chiropractor and Engineer, maintenance. [address], [place],   [address], dial [phone].  -[facility] provides transitional housing to veterans and the disabled. Clients will also access other services too, including assistance in applying for Disability, life skills classes, case management, and assistance in finding permanent housing. [address], [place], [place]  [place] [address], call [phone]  -[facility] [place] Transitional Housing through [facility] is for people who were just  evicted or that are formerly homeless. The non-profit will also help then gain self-sufficiency, find a home or apartment to live in, and also provides information on rent assistance when needed. Phone [phone]  -The [place] [facility] [facility] helps low income, elderly, or disabled residents in seven counties in the [place] ([place], [place], [place], [place], [place], [place], [place], and [place]) save energy and reduce their utility bills by improving energy efficiency. Phone [phone].  -[employer] is located in the [place] [facility] in the [employer], [address], [address], [place], [place] [address]. Parking is in the rear of the building. Phone: [phone]   General Email: info@gsohc .org  [facility] provides free housing counseling assistance in locating affordable rental housing or housing with support services for families and individuals in crisis and the chronically homeless. We provide potential resources for other housing needs like utilities. Our trained counselors also work with clients on budgeting and financial literacy in effort to empower them to take control of their financial situations. [employer] collaborates with homeless service providers and other stakeholders as part of the [employer] COC (Continuum of Care). The (COC) is a regional/local planning body that coordinates housing and services funding for homeless families and individuals. The role of [facility] in the COC is through housing counseling to work with people we serve on diversion strategies for those that are at imminent risk of becoming homeless. We also work with the Coordinated Assessment/Entry Specialist who attempts to find temporary solutions and/or connects the people  to Housing First, Rapid Re-housing or transitional housing programs. Our Homelessness Prevention Housing Counselors meet with clients on business days (Monday-Fridays, except scheduled holidays) from 8:30 am to 4:30 pm.  Legal assistance for evictions, foreclosure, and more -If you need free legal advice on civil issues, such as foreclosures, evictions, Electronics engineer, government programs, domestic issues and more, Landscape architect of   [facility]) is a Associate Professor firm that provides free legal services and counsel to lower income people, seniors, disabled, and others, The goal is to ensure everyone has access to justice and fair representation. Call them at ([phone].  [facility] for Housing and Community Studies can provide info about obtaining legal assistance with evictions. Phone [phone].  Data processing manager  The [employer], [employer]. offers job and Dispensing optician. Resources are focused on helping students obtain the skills and experiences that are necessary to compete in today's challenging and tight job market. The non-profit faith-based community action agency offers internship trainings as well as classroom instruction. Classes are tailored to meet the needs of people in the [facility] region. [place], [place] [address], [phone]  Foreclosure prevention/Debt Services Family Services of the [employer] Credit Counseling Service inludes debt and foreclosure prevention programs for local families. This includes money management, financial advice, budget review and development of a written action plan with a Pensions consultant to help solve specific individual financial problems. In addition, housing and mortgage counselors can also provide pre- and post-purchase homeownership counseling, default resolution counseling (to prevent foreclosure) and reverse mortgage counseling. A Debt Management Program allows  people and families with a high level of credit card or medical debt to consolidate and repay consumer debt and loans to creditors and rebuild positive credit ratings and scores. Contact ([phone] F1555895.  Community clinics in [place] -Health Department [facility]: [address] [place], [place], [address]. [phone].  -Health Department [facility] [place] [facility]: [phone], [facility], [address]. [phone].  -[facility] offers medical care through a group of doctors, pharmacies and other healthcare related agencies that offer services for low income, uninsured adults in [place]. Also offers adult Dental care and assistance with applying for an [employer]. Call [phone].   [facility]. This center provides low-cost health care to those without health insurance. Services offered include an onsite pharmacy. Phone [phone]. [address], [address], [place].  -Medication Assistance Program serves as a link between pharmaceutical companies and patients to provide low cost or free prescription medications. This service is available for residents who meet certain income restrictions and have no insurance coverage. PLEASE CALL [phone] [place]) OR [phone] ([place])  -One Step Further: Materials engineer, The [employer] Support & Nutrition Program, [employer]. Call [phone]/ [phone].  Food pantry and assistance -[facility]-Food Bank: [address], [place] [address]. Phone [phone]  -[facility] Food Pantry: [address], [place], [place] [address]. [phone].  -Missionary Ministry: has the purpose of visiting the sick and shut-ins and provide for needs in the surrounding communities. Call [phone]. Email: [email or web address] .com This program provides: Food box for seniors, Financial assistance, Food to meet basic  nutritional needs.  -Meals on Wheels with Senior Resources: [facility] residents age 60 and over who are homebound and unable to obtain and prepare a nutritious meal for themselves are eligible for this service. There may be a waiting list in certain parts of [facility] if the route in that area is full. If you are in [facility] and [place] call [phone] to register. For all other areas call [phone] to register.  -Greater Dietitian: https://findfood.BargainContractor.[email or web address]  TRANSPORTATION: -[employer] Department of Health: Call [facility] and [employer] at [phone] for details. AttractionGuides.es  -Access [place]: Access [place] is the [employer] Agency's shared-ride transportation service for eligible riders who have a disability that prevents them from riding the fixed route bus. Call [phone]. Access [place] riders must pay a fare of $1.50 per trip, or may purchase a 10-ride punch card for $14.00 ($1.40 per ride) or a 40-ride punch card for $48.00 ($1.20 per ride).  -The Shepherd's WHEELS rideshare transportation service is provided for senior citizens (60+) who live independently within [place] city limits and are unable to drive or have limited access to transportation. Call [phone] to schedule an appointment.  -[place] Transportation: For Medicare or Medicaid recipients call [phone]?S[facility]RA Ambulance, wheelchair fleeta, and ambulatory quotes available.   FLEEING VIOLENCE: -Family Services of the [place]- 24/7 Crisis line [phone]) -[facility] Justice Centers: (336) [phone]-[phone])  [place] 2-1-1 is another useful way to locate resources in the community. Visit ShedSizes.ch to find service information online. If you need additional assistance, 2-1-1 Referral Specialists are available 24 hours a day, every day by dialing  2-1-1 or [phone] from any phone. The call is free, confidential, and available in any language.  Affordable Housing Search http://www.nchousingsearch.[facility])   M-F 8a-3p [address] [place]  [place], [place] [address] [phone] Services include: laundry, barbering, support groups, case management, phone & computer access, showers, AA/NA mtgs, mental health/substance abuse nurse, job skills class, disability information, VA assistance, spiritual classes, etc. Winter Shelter available when temperatures are less than 32 degrees.   HOMELESS SHELTERS [facility] at [facility]- Call [phone] ext. [phone]  or ext. [phone]. Located at [address], [place], [place] [address]  [facility] Mens Shelter- Call [phone]. Located at [address] [place] [address].  [facility] [employer]. Call [phone]. Office located at [address], [place] [address].  [facility] Family Housing through [place] [phone].  [facility]- Call [phone]. Located at [address] [place], [place], [place] [address].  Room at the Inn-For Pregnant mothers. Call [phone]. Located at [address] [place], [address].  [facility]-For men in [place]. Call [phone]. [facility]helter in [place]. Call [phone].  Home of [employer] for [employer] [phone]. Office located at [address] [place], [address].  [employer] be agreeable to help with chores. Call [phone] ext. [phone].  Men's: [address] [place], [place] [address]. Women's: [facility]  [address] [place], [place] [address]  Crisis Services [facility] Mobile Crisis Management- [phone]  [facility] [address] [place], [place] [address]. Phone: [phone]  TRANSPORTATION: -[place] Department of Health: Call [facility] and [employer] at [phone] for details. AttractionGuides.es  -Access [place]: Access [place] is the [employer]'s shared-ride transportation service for eligible riders who have a disability that prevents them from riding the fixed route bus. Call [phone]. Access [place] riders must pay a fare of $1.50 per trip, or may purchase a 10-ride punch card for $14.00 ($1.40 per ride) or a 40-ride punch card for $48.00 ($1.20 per ride).  -The Shepherd's WHEELS rideshare transportation service is provided for senior citizens (60+) who live independently within [place] city limits and are unable to drive or have limited access to transportation. Call [phone] to schedule an appointment.  -[place] Transportation: For Medicare or Medicaid recipients call [phone]?[facility]RA Ambulance, wheelchair fleeta, and ambulatory quotes available.   MEDICAID TRANSPORTATION: -If you have a Medicaid blue card or pink card and have no other means for transportation to doctor's offices, clinics, dentists, hospitals, and other health related trip needs.  -Transportation services are available to all [place] locations. Trips to [facility] and [place] are provided in association with PART. -Services are provided between 6:00AM and 9:00PM Monday-Friday. -Call [phone] to schedule a trip or request further information.
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
## 2024-08-02 NOTE — Progress Notes (Signed)
 Patient ID: Devin [name], male   DOB: 03/14/1961, 63 y.o.   MRN: [id number]     Advanced Heart Failure Rounding Note  HF Cardiologist: Dr. Rolan  Chief Complaint: Acute on chronic systolic CHF w/ low-output  Subjective:    Remains on milrinone 0.125. Co-ox 72%.  I/Os net negative 4325.  CVP 5.  Na 125 => 127.   Breathing has improved but still has some coughing.  He is a smoker at home.    Objective:   Weight Range: 79.3 kg Body mass index is 21.85 kg/m.   Vital Signs:   Temp:  [97 F (36.1 C)-98.4 F (36.9 C)] 97.6 F (36.4 C) (10/06 0400) Pulse Rate:  [108-116] 114 (10/06 0400) Resp:  [14-23] 19 (10/06 0410) BP: (94-124)/(60-83) 94/60 (10/06 0400) SpO2:  [18 %-99 %] 98 % (10/06 0400) Weight:  [79.3 kg] 79.3 kg (10/06 0451) Last BM Date : 07/31/24  Weight change: Filed Weights   07/31/24 0356 08/01/24 0422 08/02/24 0451  Weight: 86.7 kg 80 kg 79.3 kg    Intake/Output:   Intake/Output Summary (Last 24 hours) at 08/02/2024 0732 Last data filed at 08/02/2024 0455 Gross per 24 hour  Intake --  Output 4325 ml  Net -4325 ml      Physical Exam  CVP 5 General: NAD Neck: No JVD, no thyromegaly or thyroid nodule.  Lungs: Coarse BS CV: Nondisplaced PMI.  Heart regular S1/S2, no S3/S4, no murmur.  No peripheral edema.    Abdomen: Soft, nontender, no hepatosplenomegaly, no distention.  Skin: Intact without lesions or rashes.  Neurologic: Alert and oriented x 3.  Psych: Normal affect. Extremities: No clubbing or cyanosis.  HEENT: Normal.   Telemetry   Sinus tachycardia 110s, personally reviewed   Labs    CBC Recent Labs    08/01/24 0223 08/02/24 0446  WBC 8.2 6.6  HGB 15.2 14.5  HCT 46.6 44.4  MCV 90.1 90.1  PLT 225 233   Basic Metabolic Panel Recent Labs    89/94/74 0223 08/02/24 0446  NA 125* 127*  K 4.2 4.2  CL 92* 94*  CO2 23 25  GLUCOSE 147* 140*  BUN 30* 29*  CREATININE 1.15 1.00  CALCIUM  8.8* 8.7*  MG 1.9 1.9   Liver Function  Tests No results for input(s): AST, ALT, ALKPHOS, BILITOT, PROT, ALBUMIN in the last 72 hours.  No results for input(s): LIPASE, AMYLASE in the last 72 hours. Cardiac Enzymes No results for input(s): CKTOTAL, CKMB, CKMBINDEX, TROPONINI in the last 72 hours.  BNP: BNP (last 3 results) Recent Labs    07/27/24 1931  BNP >4,500.0*    ProBNP (last 3 results) No results for input(s): PROBNP in the last 8760 hours.   D-Dimer No results for input(s): DDIMER in the last 72 hours. Hemoglobin A1C No results for input(s): HGBA1C in the last 72 hours. Fasting Lipid Panel No results for input(s): CHOL, HDL, LDLCALC, TRIG, CHOLHDL, LDLDIRECT in the last 72 hours. Thyroid Function Tests No results for input(s): TSH, T4TOTAL, T3FREE, THYROIDAB in the last 72 hours.  Invalid input(s): FREET3  Other results:   Imaging    No results found.    Medications:     Scheduled Medications:  acetaminophen   500 mg Oral Q6H   apixaban   5 mg Oral BID   bisacodyl   10 mg Rectal Once   busPIRone  10 mg Oral TID   Chlorhexidine  Gluconate Cloth  6 each Topical Daily   dapagliflozin  propanediol  10 mg Oral Daily  digoxin   0.125 mg Oral Daily   DULoxetine   60 mg Oral Daily   insulin aspart  0-15 Units Subcutaneous TID WC   leptospermum manuka honey  1 Application Topical Daily   nicotine   14 mg Transdermal Daily   OLANZapine   5 mg Oral QHS   polyethylene glycol  17 g Oral BID   sacubitril -valsartan   1 tablet Oral BID   sodium chloride  flush  10-40 mL Intracatheter Q12H   sodium chloride  flush  3 mL Intravenous Q12H   spironolactone   25 mg Oral Daily   torsemide   20 mg Oral Daily    Infusions:  magnesium  sulfate bolus IVPB      PRN Medications: acetaminophen , ipratropium, lidocaine , magic mouthwash w/lidocaine , ondansetron  **OR** ondansetron  (ZOFRAN ) IV, ondansetron  (ZOFRAN ) IV, mouth rinse, oxyCODONE , sodium chloride  flush, sodium  chloride flush   Assessment/Plan   1. Acute on chronic systolic CHF: Nonischemic cardiomyopathy diagnosed 05/25 with echo that showed EF < 20%, severe RV dysfunction, mild RV enlargement.  Cath with no significant CAD.  Cardiac index was low but not markedly low (CI 2.14).  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 Not able to complete cMRI d/t claustrophobia.  Not compliant with meds/followup. 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  RHC this admission with CI 1.5, markedly high filling pressures.  Started milrinone 0.25 with IV Lasix . Remains on milrinone 0.125. Co-ox 72%. He has diuresed well, CVP 5.  Creatinine stable 1.0. SBP 90s-100s, no room to titrate Entresto .  - Stop milrinone today.  - Continue digoxin  0.125  - Continue Spironolactone  25 mg  - Continue Entresto  24/26 bid.  - Continue torsemide  20 mg daily . - Continue Farxiga  10 daily.  - Not candidate for advanced therapies with noncompliance and active amphetamine abuse as well as active psychiatric issues.  2. 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  3. SDH: Small, traumatic.  Observation, ok'd by NSG to go on apixaban .  4. COPD: Active smoker.  Advised to quit.  5. HIV: Apparently has not been taking his antiretrovirals.  - Will need to restart meds, per ID.  6. H/o PE/DVT: probably has not been compliant at home with apixaban .  - Apixaban  restarted.  7. Psych: H/o methamphetamine-induced psychosis, has been having hallucinations/paranoia this admission as well.  - Psychiatry following.   Mobilize.   Length of Stay: 9  Ezra Shuck, MD  08/02/2024, 7:32 AM  Advanced Heart Failure Team Pager [phone] (M-F; 7a - 5p)  Please contact [facility] for night-coverage after hours (5p -7a ) and weekends on [email or web address]
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
## 2024-08-02 NOTE — TOC Progression Note (Addendum)
 Transition of Care [facility]) - Progression Note    Patient Details  Name: Devin [name] MRN: [id number] Date of Birth: [birth year]  [facility]) CM/SW Contact  [name], [place] Phone Number: 08/02/2024, 9:43 AM  Clinical Narrative:   Per RNCM note, family agreeable to SNF for patient as patient was disoriented at this time. CSW faxed patient out for SNF and awaiting bed offers.   10:30AM Patient is oriented x4 at this time, CSW spoke with patient about PT recs for SNF and provided medicare.gov ratings for accepting bed offers. Patient stated he wants to go home at this time.   Per bedside RN, PT worked with patient and stated patient can return home with [facility].   12:21 PM CSW added resources to patients AVS.   CSW will continue to follow.    Expected Discharge Plan: Skilled Nursing Facility Barriers to Discharge: Continued Medical Work up               Expected Discharge Plan and Services   Discharge Planning Services: CM Consult   Living arrangements for the past 2 months: Single Family Home Expected Discharge Date: 07/27/24                                     Social Drivers of Health (SDOH) Interventions SDOH Screenings   Tobacco Use: High Risk (07/25/2024)    Readmission Risk Interventions     No data to display

## 2024-08-03 ENCOUNTER — Other Ambulatory Visit (HOSPITAL_COMMUNITY): Payer: Self-pay

## 2024-08-03 ENCOUNTER — Telehealth (HOSPITAL_COMMUNITY): Payer: Self-pay | Admitting: Pharmacy Technician

## 2024-08-03 DIAGNOSIS — I5023 Acute on chronic systolic (congestive) heart failure: Secondary | ICD-10-CM | POA: Diagnosis not present

## 2024-08-03 DIAGNOSIS — Z86711 Personal history of pulmonary embolism: Secondary | ICD-10-CM | POA: Diagnosis not present

## 2024-08-03 DIAGNOSIS — N179 Acute kidney failure, unspecified: Secondary | ICD-10-CM | POA: Diagnosis not present

## 2024-08-03 LAB — MAGNESIUM: Magnesium: 1.8 mg/dL (ref 1.7–2.4)

## 2024-08-03 LAB — CBC
HCT: 45.1 % (ref 39.0–52.0)
Hemoglobin: 14.5 g/dL (ref 13.0–17.0)
MCH: 29.4 pg (ref 26.0–34.0)
MCHC: 32.2 g/dL (ref 30.0–36.0)
MCV: 91.3 fL (ref 80.0–100.0)
Platelets: 249 K/uL (ref 150–400)
RBC: 4.94 MIL/uL (ref 4.22–5.81)
RDW: 15 % (ref 11.5–15.5)
WBC: 6.5 K/uL (ref 4.0–10.5)
nRBC: 0 % (ref 0.0–0.2)

## 2024-08-03 LAB — GLUCOSE, CAPILLARY
Glucose-Capillary: 125 mg/dL — ABNORMAL HIGH (ref 70–99)
Glucose-Capillary: 326 mg/dL — ABNORMAL HIGH (ref 70–99)
Glucose-Capillary: 84 mg/dL (ref 70–99)

## 2024-08-03 LAB — BASIC METABOLIC PANEL WITH GFR
Anion gap: 7 (ref 5–15)
BUN: 35 mg/dL — ABNORMAL HIGH (ref 8–23)
CO2: 26 mmol/L (ref 22–32)
Calcium: 8.4 mg/dL — ABNORMAL LOW (ref 8.9–10.3)
Chloride: 100 mmol/L (ref 98–111)
Creatinine, Ser: 0.96 mg/dL (ref 0.61–1.24)
GFR, Estimated: >60 mL/min (ref >60)
Glucose, Bld: 111 mg/dL — ABNORMAL HIGH (ref 70–99)
Potassium: 4 mmol/L (ref 3.5–5.1)
Sodium: 133 mmol/L — ABNORMAL LOW (ref 135–145)

## 2024-08-03 LAB — COOXEMETRY PANEL
Carboxyhemoglobin: 1.7 % — ABNORMAL HIGH (ref 0.5–1.5)
Methemoglobin: 1 % (ref 0.0–1.5)
O2 Saturation: 74.1 %
Total hemoglobin: 15.3 g/dL (ref 12.0–16.0)

## 2024-08-03 MED ORDER — OLANZAPINE 5 MG PO TABS
5.0000 mg | ORAL_TABLET | Freq: Every day | ORAL | 0 refills | Status: AC
  Filled 2024-08-03: qty 30, 30d supply, fill #0

## 2024-08-03 MED ORDER — TORSEMIDE 20 MG PO TABS
20.0000 mg | ORAL_TABLET | Freq: Every day | ORAL | 0 refills | Status: DC
  Filled 2024-08-03: qty 30, 30d supply, fill #0

## 2024-08-03 MED ORDER — DULOXETINE HCL 60 MG PO CPEP
60.0000 mg | ORAL_CAPSULE | Freq: Every day | ORAL | 0 refills | Status: AC
  Filled 2024-08-03: qty 30, 30d supply, fill #0

## 2024-08-03 MED ORDER — SACUBITRIL-VALSARTAN 24-26 MG PO TABS
1.0000 | ORAL_TABLET | Freq: Two times a day (BID) | ORAL | 0 refills | Status: DC
  Filled 2024-08-03: qty 60, 30d supply, fill #0

## 2024-08-03 MED ORDER — IVABRADINE HCL 5 MG PO TABS
5.0000 mg | ORAL_TABLET | Freq: Two times a day (BID) | ORAL | Status: DC
  Filled 2024-08-03: qty 1

## 2024-08-03 MED ORDER — DAPAGLIFLOZIN PROPANEDIOL 10 MG PO TABS
10.0000 mg | ORAL_TABLET | Freq: Every day | ORAL | 0 refills | Status: DC
  Filled 2024-08-03: qty 30, 30d supply, fill #0

## 2024-08-03 MED ORDER — BUSPIRONE HCL 10 MG PO TABS
10.0000 mg | ORAL_TABLET | Freq: Three times a day (TID) | ORAL | 0 refills | Status: AC
  Filled 2024-08-03: qty 90, 30d supply, fill #0

## 2024-08-03 MED ORDER — SPIRONOLACTONE 25 MG PO TABS
25.0000 mg | ORAL_TABLET | Freq: Every day | ORAL | 0 refills | Status: DC
  Filled 2024-08-03: qty 30, 30d supply, fill #0

## 2024-08-03 MED ORDER — IVABRADINE HCL 5 MG PO TABS
5.0000 mg | ORAL_TABLET | Freq: Two times a day (BID) | ORAL | 0 refills | Status: DC
  Filled 2024-08-03: qty 60, 30d supply, fill #0

## 2024-08-03 MED ORDER — ACETAMINOPHEN 500 MG PO TABS: 500.0000 mg | ORAL_TABLET | Freq: Four times a day (QID) | ORAL | Status: AC

## 2024-08-03 MED ORDER — DIGOXIN 125 MCG PO TABS
0.1250 mg | ORAL_TABLET | Freq: Every day | ORAL | 0 refills | Status: DC
  Filled 2024-08-03: qty 30, 30d supply, fill #0

## 2024-08-03 MED ORDER — APIXABAN 5 MG PO TABS
5.0000 mg | ORAL_TABLET | Freq: Two times a day (BID) | ORAL | 0 refills | Status: DC
  Filled 2024-08-03: qty 60, 30d supply, fill #0

## 2024-08-03 MED ORDER — MAGNESIUM SULFATE 2 GM/50ML IV SOLN
2.0000 g | Freq: Once | INTRAVENOUS | Status: AC
  Administered 2024-08-03 (×2): 2 g via INTRAVENOUS
  Filled 2024-08-03: qty 50

## 2024-08-03 NOTE — TOC Transition Note (Addendum)
 Transition of Care [facility]) - Discharge Note   Patient Details  Name: Devin [name] MRN: [id number] Date of Birth: [birth year]  Transition of [facility]) CM/SW Contact:  Justina Delcia Czar, RN Phone Number: [phone] 08/03/2024, 1:52 PM   Clinical Narrative:     Spoke to pt at bedside. Lives at home with roommate. Gave permission to call son, Will. Spoke to son, and states he is going to try to get guardianship. He is trying to assist him.   PCP hospital follow up appt scheduled for [facility] Oneil Severin FNP for 08/19/2024 at 1130 am.   Pt states he will need taxi home. Transportation will provided by discharge lounge.   Rider waiver placed on chart. Pt states he has a $350 stipend from [facility] Medicare to help with food and bills. States he will contact [facility] Medicare for assistance with transportation to appts.   Final next level of care: Home/Self Care Barriers to Discharge: No Barriers Identified   Patient Goals and CMS Choice Patient states their goals for this hospitalization and ongoing recovery are:: wants to go home          Discharge Placement                       Discharge Plan and Services Additional resources added to the After Visit Summary for     Discharge Planning Services: CM Consult                                 Social Drivers of Health (SDOH) Interventions SDOH Screenings   Tobacco Use: High Risk (07/25/2024)     Readmission Risk Interventions     No data to display

## 2024-08-03 NOTE — Progress Notes (Signed)
 Reviewed AVS, patient expressed understanding of medications, MD follow up reviewed.   Removed IV, Site clean, dry and intact.  See LDA for information on wounds at discharge. Patient states all belongings brought to the hospital at time of admission are accounted for and packed to take home.  Picked up medications from [facility] pharmacy. This nurse to transport patient to Discharge lounge to get a taxi voucher.

## 2024-08-03 NOTE — Telephone Encounter (Signed)
 Pharmacy Patient Advocate Encounter   Received notification from Inpatient Request that prior authorization for Ivabradine HCl 5MG  tablets is required/requested.   Insurance verification completed.   The patient is insured through [place].   Per test claim: PA required; PA submitted to above mentioned insurance via Latent Key/confirmation #/EOC [id number] Status is pending

## 2024-08-03 NOTE — Progress Notes (Signed)
 OT Cancellation Note  Patient Details Name: Devin [name] MRN: [id number] DOB: 1961-02-20   Cancelled Treatment:    Reason Eval/Treat Not Completed: Patient declined, no reason specified (pt agitated, states he is not doing anything until he has a shower, declines offer for seated washup at sink, pt with tele and mulitple IV sites and CVP. Will check back later as schedule permits.)  Sholonda Jobst K, OTD, OTR/L SecureChat Preferred Acute Rehab [phone]   Devin [name] 08/03/2024, 8:53 AM
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
## 2024-08-03 NOTE — Telephone Encounter (Signed)
 Pharmacy Patient Advocate Encounter  Received notification from [facility] that Prior Authorization for Ivabradine HCL 5 mg tablets been APPROVED from 08/03/2025 to 10/27/2025. Ran test claim, Copay is $0.00. This test claim was processed through [facility]- copay amounts may vary at other pharmacies due to pharmacy/plan contracts, or as the patient moves through the different stages of their insurance plan.   PA #/Case ID/Reference #: [id number]

## 2024-08-03 NOTE — Progress Notes (Signed)
 Patient ID: Devin [name], male   DOB: 1960-12-05, 63 y.o.   MRN: [id number]     Advanced Heart Failure Rounding Note  HF Cardiologist: Dr. Rolan  Chief Complaint: Acute on chronic systolic CHF w/ low-output  Subjective:    He is now off milrinone. Co-ox 74%.  CVP 6.  Na 125 => 127 => 133.   Breathing better.  He no longer seems to be hallucinating.    Objective:   Weight Range: 78.1 kg Body mass index is 21.52 kg/m.   Vital Signs:   Temp:  [98 F (36.7 C)-99 F (37.2 C)] 99 F (37.2 C) (10/07 0425) Pulse Rate:  [97-124] 110 (10/07 0425) Resp:  [18-20] 20 (10/07 0425) BP: (95-128)/(59-84) 109/76 (10/07 0425) SpO2:  [97 %-100 %] 100 % (10/07 0425) Weight:  [78.1 kg] 78.1 kg (10/07 0425) Last BM Date : 07/31/24  Weight change: Filed Weights   08/01/24 0422 08/02/24 0451 08/03/24 0425  Weight: 80 kg 79.3 kg 78.1 kg    Intake/Output:   Intake/Output Summary (Last 24 hours) at 08/03/2024 0725 Last data filed at 08/03/2024 0427 Gross per 24 hour  Intake 2824.22 ml  Output 2100 ml  Net 724.22 ml      Physical Exam  CVP 6 General: NAD Neck: No JVD, no thyromegaly or thyroid nodule.  Lungs: Clear to auscultation bilaterally with normal respiratory effort. CV: Lateral PMI.  Heart regular S1/S2, no S3/S4, no murmur.  No peripheral edema.   Abdomen: Soft, nontender, no hepatosplenomegaly, no distention.  Skin: Intact without lesions or rashes.  Neurologic: Alert and oriented x 3.  Psych: Normal affect. Extremities: No clubbing or cyanosis.  HEENT: Normal.   Telemetry   Sinus tachycardia 100s-110s, personally reviewed   Labs    CBC Recent Labs    08/02/24 0446 08/03/24 0451  WBC 6.6 6.5  HGB 14.5 14.5  HCT 44.4 45.1  MCV 90.1 91.3  PLT 233 249   Basic Metabolic Panel Recent Labs    89/93/74 0446 08/03/24 0433  NA 127* 133*  K 4.2 4.0  CL 94* 100  CO2 25 26  GLUCOSE 140* 111*  BUN 29* 35*  CREATININE 1.00 0.96  CALCIUM  8.7* 8.4*  MG 1.9 1.8    Liver Function Tests No results for input(s): AST, ALT, ALKPHOS, BILITOT, PROT, ALBUMIN in the last 72 hours.  No results for input(s): LIPASE, AMYLASE in the last 72 hours. Cardiac Enzymes No results for input(s): CKTOTAL, CKMB, CKMBINDEX, TROPONINI in the last 72 hours.  BNP: BNP (last 3 results) Recent Labs    07/27/24 1931  BNP >4,500.0*    ProBNP (last 3 results) No results for input(s): PROBNP in the last 8760 hours.   D-Dimer No results for input(s): DDIMER in the last 72 hours. Hemoglobin A1C No results for input(s): HGBA1C in the last 72 hours. Fasting Lipid Panel No results for input(s): CHOL, HDL, LDLCALC, TRIG, CHOLHDL, LDLDIRECT in the last 72 hours. Thyroid Function Tests No results for input(s): TSH, T4TOTAL, T3FREE, THYROIDAB in the last 72 hours.  Invalid input(s): FREET3  Other results:   Imaging    No results found.    Medications:     Scheduled Medications:  acetaminophen   500 mg Oral Q6H   apixaban   5 mg Oral BID   bisacodyl   10 mg Rectal Once   busPIRone  10 mg Oral TID   Chlorhexidine  Gluconate Cloth  6 each Topical Daily   dapagliflozin  propanediol  10 mg Oral Daily  digoxin   0.125 mg Oral Daily   DULoxetine   60 mg Oral Daily   insulin aspart  0-15 Units Subcutaneous TID WC   ivabradine  5 mg Oral BID WC   leptospermum manuka honey  1 Application Topical Daily   nicotine   14 mg Transdermal Daily   OLANZapine   5 mg Oral QHS   polyethylene glycol  17 g Oral BID   sacubitril -valsartan   1 tablet Oral BID   sodium chloride  flush  10-40 mL Intracatheter Q12H   sodium chloride  flush  3 mL Intravenous Q12H   spironolactone   25 mg Oral Daily   torsemide   20 mg Oral Daily    Infusions:  magnesium  sulfate bolus IVPB      PRN Medications: acetaminophen , ipratropium, lidocaine , magic mouthwash w/lidocaine , ondansetron  **OR** ondansetron  (ZOFRAN ) IV, ondansetron  (ZOFRAN ) IV, mouth  rinse, oxyCODONE , sodium chloride  flush, sodium chloride  flush   Assessment/Plan   1. Acute on chronic systolic CHF: Nonischemic cardiomyopathy diagnosed 05/25 with echo that showed EF < 20%, severe RV dysfunction, mild RV enlargement.  Cath with no significant CAD.  Cardiac index was low but not markedly low (CI 2.14).  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 Not able to complete cMRI d/t claustrophobia.  Not compliant with meds/followup. 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  RHC this admission with CI 1.5, markedly high filling pressures.  Started milrinone 0.25 with IV Lasix , now weaned off milrinone. Co-ox 74%. He has diuresed well, CVP 6.  Creatinine stable 0.96. SBP 90s-100s, no room to titrate Entresto .  - With sinus tachycardia and low output HF this admission, will start ivabradine 5 mg bid.   - Continue digoxin  0.125  - Continue Spironolactone  25 mg  - Continue Entresto  24/26 bid.  - Continue torsemide  20 mg daily. - Continue Farxiga  10 daily.  - Not candidate for advanced therapies with noncompliance and active amphetamine abuse as well as active psychiatric issues.  2. 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  3. SDH: Small, traumatic.  Observation, ok'd by NSG to go on apixaban .  4. COPD: Active smoker.  Advised to quit.  5. HIV: Apparently has not been taking his antiretrovirals.  - Will need to restart meds, per ID.  6. H/o PE/DVT: probably has not been compliant at home with apixaban .  - Apixaban  restarted.  7. Psych: H/o methamphetamine-induced psychosis, had hallucinations/paranoia this admission as well. Hallucinations now appear to have stopped.  - Psychiatry following.   From my standpoint, he can go home.  Will arrange followup in CHF clinic.  Cardiac meds for  home: digoxin  0.125, spironolactone  25 daily, ivabradine 5 mg bid, Entresto  24/26 bid, torsemide  20 daily, Farxiga  10 daily, apixaban  5 bid.   Length of Stay: 10  Ezra Shuck, MD  08/03/2024, 7:25 AM  Advanced Heart Failure Team Pager [phone] (M-F; 7a - 5p)  Please contact [facility] for night-coverage after hours (5p -7a ) and weekends on [email or web address]

## 2024-08-03 NOTE — TOC Progression Note (Addendum)
 Transition of Care [facility]) - Progression Note    Patient Details  Name: Devin [name] MRN: [id number] Date of Birth: [birth year]  [facility] CM/SW Contact  [name] Phone Number: [phone] 08/03/2024, 12:40 PM  Clinical Narrative:  HF CSW attempted to schedule patients PCP hospital follow up appointment.  PCP will contact the patient directly to schedule appointment due to them only having availability until Nov/Dec.   HF CSW/CM will continue following and monitor for dc readiness.     Expected Discharge Plan: Skilled Nursing Facility Barriers to Discharge: Continued Medical Work up               Expected Discharge Plan and Services   Discharge Planning Services: CM Consult   Living arrangements for the past 2 months: Single Family Home Expected Discharge Date: 07/27/24                                     Social Drivers of Health (SDOH) Interventions SDOH Screenings   Tobacco Use: High Risk (07/25/2024)    Readmission Risk Interventions     No data to display

## 2024-08-03 NOTE — Plan of Care (Signed)
  Problem: Clinical Measurements: Goal: Respiratory complications will improve Outcome: Progressing Goal: Cardiovascular complication will be avoided Outcome: Progressing   Problem: Elimination: Goal: Will not experience complications related to urinary retention Outcome: Progressing   Problem: Safety: Goal: Ability to remain free from injury will improve Outcome: Progressing   Problem: Tissue Perfusion: Goal: Adequacy of tissue perfusion will improve Outcome: Progressing

## 2024-08-03 NOTE — Care Management Important Message (Signed)
 Important Message  Patient Details  Name: Devin [name] MRN: [id number] Date of Birth: 1961/05/05   Important Message Given:  Yes - Medicare IM     [name] 08/03/2024, 12:00 PM

## 2024-08-03 NOTE — TOC CM/SW Note (Signed)
..  08/03/2024  Devin [name] DOB: 12/24/1960 MRN: [id number]   RIDER WAIVER AND RELEASE OF LIABILITY  For the purposes of helping with transportation needs, [facility] partners with outside transportation providers (taxi companies, [place], Catering manager.) to give [facility] patients or other approved people the choice of on-demand rides Public librarian) to our buildings for non-emergency visits.  By using [employer], I, the person signing this document, on behalf of myself and/or any legal minors (in my care using the [employer]), agree:  Science writer given to me are supplied by independent, outside transportation providers who do not work for, or have any affiliation with, [employer]. [facility] is not a transportation company. [facility] has no control over the quality or safety of the rides I get using [employer]. [facility] has no control over whether any outside ride will happen on time or not. [facility] gives no guarantee on the reliability, quality, safety, or availability on any rides, or that no mistakes will happen. I know and accept that traveling by vehicle (car, truck, SVU, fleeta, bus, taxi, etc.) has risks of serious injuries such as disability, being paralyzed, and death. I know and agree the risk of using [employer] is mine alone, and not [employer]. [employer] are provided as is and as are available. The transportation providers are in charge for all inspections and care of the vehicles used to provide these rides. I agree not to take legal action against [facility], its agents, employees, officers, directors, representatives, insurers, attorneys, assigns, successors, subsidiaries, and affiliates at any time for any reasons related directly or indirectly to using [employer]. I also agree not to take legal action against [facility] or its affiliates for any injury, death, or damage to property caused by or related to using  [employer]. I have read this Waiver and Release of Liability, and I understand the terms used in it and their legal meaning. This Waiver is freely and voluntarily given with the understanding that my right (or any legal minors) to legal action against  relating to [employer] is knowingly given up to use these services.   I attest that I read the Ride Waiver and Release of Liability to Devin [name], gave Devin [name] the opportunity to ask questions and answered the questions asked (if any). I affirm that Devin [name] then provided consent for assistance with transportation.

## 2024-08-03 NOTE — Telephone Encounter (Signed)
 Patient Product/process development scientist completed.    The patient is insured through [facility]. Patient has Medicare and is not eligible for a copay card, but may be able to apply for patient assistance or Medicare RX Payment Plan (Patient Must reach out to their plan, if eligible for payment plan), if available.    Ran test claim for ivabradine (Corlanor) 5 mg and Requires Prior Authorization   This test claim was processed through [employer]- copay amounts may vary at other pharmacies due to [employer], or as the patient moves through the different stages of their insurance plan.     Reyes Sharps, CPHT Pharmacy Technician Patient Advocate Specialist Lead [facility] Patient Advocate Team Direct Number: [phone]  Fax: [phone]

## 2024-08-03 NOTE — Progress Notes (Signed)
 Mobility Specialist Progress Note:    08/03/24 1155  Mobility  Activity Ambulated independently  Level of Assistance Standby assist, set-up cues, supervision of patient - no hands on  Assistive Device None  Distance Ambulated (ft) 25 ft  Activity Response Tolerated well  Mobility Referral Yes  Mobility visit 1 Mobility  Mobility Specialist Start Time (ACUTE ONLY) 1155  Mobility Specialist Stop Time (ACUTE ONLY) 1201  Mobility Specialist Time Calculation (min) (ACUTE ONLY) 6 min   Received pt ambulating in room attempting to go take shower. No c/o any symptoms. Pt able to move and ambulate w/o much assist. Left pt in room w/ all needs met. NT assisting pt.  Venetia Keel Mobility Specialist Please Neurosurgeon or Rehab Office at [phone]

## 2024-08-03 NOTE — Progress Notes (Signed)
   08/03/24 0000  Assess: MEWS Score  Temp 98 F (36.7 C)  BP 95/62  MAP (mmHg) 74  Pulse Rate 97  ECG Heart Rate (!) 113  Resp 18  Level of Consciousness Alert  SpO2 99 %  O2 Device Room Air  Assess: MEWS Score  MEWS Temp 0  MEWS Systolic 1  MEWS Pulse 2  MEWS RR 0  MEWS LOC 0  MEWS Score 3  MEWS Score Color Yellow  Assess: SIRS CRITERIA  SIRS Temperature  0  SIRS Respirations  0  SIRS Pulse 1  SIRS WBC 0  SIRS Score Sum  1   Patient on yellow MEWS due to HR ranges 110-115, denies chest pain, palpitations, shortness of breath,MD notified. Continue to provide care per plan. Safety precautions in place.

## 2024-08-04 ENCOUNTER — Encounter: Payer: Self-pay | Admitting: Psychiatry

## 2024-08-04 ENCOUNTER — Telehealth: Payer: Self-pay
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
## 2024-08-06 ENCOUNTER — Ambulatory Visit: Payer: Self-pay | Admitting: *Deleted

## 2024-08-06 ENCOUNTER — Telehealth (HOSPITAL_COMMUNITY): Payer: Self-pay
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
## 2024-08-06 NOTE — Telephone Encounter (Addendum)
 Patient called stating that he was experiencing active chest pain, denies headaches,vision changes, nausea/vomiting. Patient then stated that he didn't think it was his heart but rather a pulled muscle. I offered patient an afternoon appointment which said he would not be able to come today due to transportation. While on the phone with patient he starts talking about how bad his chest is bothering him and stated that he needed his pain meds.I advised him that he should be seen in the ER, he said no he just needs his pain meds. I advised patient that we do not handle his pain meds and told him to follow up with his pcp.

## 2024-08-07 ENCOUNTER — Encounter (HOSPITAL_COMMUNITY): Payer: Self-pay

## 2024-08-07 ENCOUNTER — Emergency Department (HOSPITAL_COMMUNITY)
Admission: EM | Admit: 2024-08-07 | Discharge: 2024-08-07 | Discharge status: Against Medical Advice | Source: Home / Self Care

## 2024-08-07 ENCOUNTER — Other Ambulatory Visit: Payer: Self-pay

## 2024-08-07 ENCOUNTER — Emergency Department (HOSPITAL_COMMUNITY)

## 2024-08-07 DIAGNOSIS — Z7901 Long term (current) use of anticoagulants: Secondary | ICD-10-CM | POA: Diagnosis not present

## 2024-08-07 DIAGNOSIS — R079 Chest pain, unspecified: Secondary | ICD-10-CM | POA: Diagnosis not present

## 2024-08-07 DIAGNOSIS — F419 Anxiety disorder, unspecified: Secondary | ICD-10-CM | POA: Insufficient documentation

## 2024-08-07 DIAGNOSIS — R0789 Other chest pain: Secondary | ICD-10-CM | POA: Diagnosis not present

## 2024-08-07 DIAGNOSIS — I081 Rheumatic disorders of both mitral and tricuspid valves: Secondary | ICD-10-CM | POA: Diagnosis not present

## 2024-08-07 DIAGNOSIS — M419 Scoliosis, unspecified: Secondary | ICD-10-CM | POA: Diagnosis not present

## 2024-08-07 DIAGNOSIS — F17210 Nicotine dependence, cigarettes, uncomplicated: Secondary | ICD-10-CM | POA: Diagnosis not present

## 2024-08-07 DIAGNOSIS — G9341 Metabolic encephalopathy: Secondary | ICD-10-CM | POA: Diagnosis not present

## 2024-08-07 DIAGNOSIS — I513 Intracardiac thrombosis, not elsewhere classified: Secondary | ICD-10-CM | POA: Diagnosis not present

## 2024-08-07 DIAGNOSIS — R0989 Other specified symptoms and signs involving the circulatory and respiratory systems: Secondary | ICD-10-CM | POA: Diagnosis not present

## 2024-08-07 DIAGNOSIS — K219 Gastro-esophageal reflux disease without esophagitis: Secondary | ICD-10-CM | POA: Diagnosis not present

## 2024-08-07 DIAGNOSIS — Z7984 Long term (current) use of oral hypoglycemic drugs: Secondary | ICD-10-CM | POA: Diagnosis not present

## 2024-08-07 DIAGNOSIS — M159 Polyosteoarthritis, unspecified: Secondary | ICD-10-CM | POA: Diagnosis not present

## 2024-08-07 DIAGNOSIS — M25572 Pain in left ankle and joints of left foot: Secondary | ICD-10-CM | POA: Diagnosis not present

## 2024-08-07 DIAGNOSIS — Z833 Family history of diabetes mellitus: Secondary | ICD-10-CM | POA: Diagnosis not present

## 2024-08-07 DIAGNOSIS — J90 Pleural effusion, not elsewhere classified: Secondary | ICD-10-CM | POA: Diagnosis not present

## 2024-08-07 DIAGNOSIS — I5023 Acute on chronic systolic (congestive) heart failure: Secondary | ICD-10-CM | POA: Diagnosis not present

## 2024-08-07 DIAGNOSIS — I959 Hypotension, unspecified: Secondary | ICD-10-CM | POA: Diagnosis not present

## 2024-08-07 DIAGNOSIS — Z5321 Procedure and treatment not carried out due to patient leaving prior to being seen by health care provider: Secondary | ICD-10-CM | POA: Insufficient documentation

## 2024-08-07 DIAGNOSIS — R000 Tachycardia, unspecified: Secondary | ICD-10-CM | POA: Diagnosis not present

## 2024-08-07 DIAGNOSIS — Z8711 Personal history of peptic ulcer disease: Secondary | ICD-10-CM | POA: Diagnosis not present

## 2024-08-07 DIAGNOSIS — J4489 Other specified chronic obstructive pulmonary disease: Secondary | ICD-10-CM | POA: Diagnosis not present

## 2024-08-07 DIAGNOSIS — Z79899 Other long term (current) drug therapy: Secondary | ICD-10-CM | POA: Diagnosis not present

## 2024-08-07 DIAGNOSIS — I110 Hypertensive heart disease with heart failure: Secondary | ICD-10-CM | POA: Diagnosis not present

## 2024-08-07 DIAGNOSIS — M7989 Other specified soft tissue disorders: Secondary | ICD-10-CM | POA: Diagnosis not present

## 2024-08-07 DIAGNOSIS — Z86711 Personal history of pulmonary embolism: Secondary | ICD-10-CM | POA: Diagnosis not present

## 2024-08-07 DIAGNOSIS — S065XAD Traumatic subdural hemorrhage with loss of consciousness status unknown, subsequent encounter: Secondary | ICD-10-CM | POA: Diagnosis not present

## 2024-08-07 DIAGNOSIS — Z823 Family history of stroke: Secondary | ICD-10-CM | POA: Diagnosis not present

## 2024-08-07 LAB — CBC
HCT: 42.8 % (ref 39.0–52.0)
Hemoglobin: 13.4 g/dL (ref 13.0–17.0)
MCH: 28.6 pg (ref 26.0–34.0)
MCHC: 31.3 g/dL (ref 30.0–36.0)
MCV: 91.3 fL (ref 80.0–100.0)
Platelets: 409 K/uL — ABNORMAL HIGH (ref 150–400)
RBC: 4.69 MIL/uL (ref 4.22–5.81)
RDW: 14.5 % (ref 11.5–15.5)
WBC: 6.4 K/uL (ref 4.0–10.5)
nRBC: 0 % (ref 0.0–0.2)

## 2024-08-07 LAB — BASIC METABOLIC PANEL WITH GFR
Anion gap: 9 (ref 5–15)
BUN: 20 mg/dL (ref 8–23)
CO2: 22 mmol/L (ref 22–32)
Calcium: 8.5 mg/dL — ABNORMAL LOW (ref 8.9–10.3)
Chloride: 100 mmol/L (ref 98–111)
Creatinine, Ser: 1.03 mg/dL (ref 0.61–1.24)
GFR, Estimated: >60 mL/min (ref >60)
Glucose, Bld: 125 mg/dL — ABNORMAL HIGH (ref 70–99)
Potassium: 4.2 mmol/L (ref 3.5–5.1)
Sodium: 131 mmol/L — ABNORMAL LOW (ref 135–145)

## 2024-08-07 LAB — BRAIN NATRIURETIC PEPTIDE: B Natriuretic Peptide: 3372 pg/mL — ABNORMAL HIGH (ref 0.0–100.0)

## 2024-08-07 LAB — TROPONIN I (HIGH SENSITIVITY): Troponin I (High Sensitivity): 82 ng/L — ABNORMAL HIGH (ref <18)

## 2024-08-07 NOTE — ED Notes (Signed)
 Pt continuously yelling at staff, wandering the hallways. Stating he wants to call a ride so he can leave, this RN encouraged pt to stay multiple times but pt very adamant about leaving. Pt wheeled to the phone in the lobby to call for a ride.

## 2024-08-07 NOTE — ED Triage Notes (Signed)
 Pt c.o chest pain for the past 2 days. Pt also c.o increased anxiety this morning.

## 2024-08-08 ENCOUNTER — Emergency Department (HOSPITAL_COMMUNITY)

## 2024-08-08 ENCOUNTER — Inpatient Hospital Stay (HOSPITAL_COMMUNITY)
Admission: EM | Admit: 2024-08-08 | Discharge: 2024-08-13 | DRG: 291 | Disposition: A | Discharge status: Home / Self Care | Attending: Internal Medicine | Admitting: Internal Medicine

## 2024-08-08 ENCOUNTER — Encounter (HOSPITAL_COMMUNITY): Payer: Self-pay

## 2024-08-08 ENCOUNTER — Other Ambulatory Visit: Payer: Self-pay

## 2024-08-08 DIAGNOSIS — F1994 Other psychoactive substance use, unspecified with psychoactive substance-induced mood disorder: Secondary | ICD-10-CM | POA: Diagnosis present

## 2024-08-08 DIAGNOSIS — F1510 Other stimulant abuse, uncomplicated: Secondary | ICD-10-CM | POA: Diagnosis present

## 2024-08-08 DIAGNOSIS — Z79899 Other long term (current) drug therapy: Secondary | ICD-10-CM | POA: Diagnosis not present

## 2024-08-08 DIAGNOSIS — R079 Chest pain, unspecified: Principal | ICD-10-CM

## 2024-08-08 DIAGNOSIS — F32A Depression, unspecified: Secondary | ICD-10-CM | POA: Diagnosis present

## 2024-08-08 DIAGNOSIS — Z7901 Long term (current) use of anticoagulants: Secondary | ICD-10-CM

## 2024-08-08 DIAGNOSIS — Z823 Family history of stroke: Secondary | ICD-10-CM | POA: Diagnosis not present

## 2024-08-08 DIAGNOSIS — I10 Essential (primary) hypertension: Secondary | ICD-10-CM | POA: Diagnosis present

## 2024-08-08 DIAGNOSIS — Z7984 Long term (current) use of oral hypoglycemic drugs: Secondary | ICD-10-CM | POA: Diagnosis not present

## 2024-08-08 DIAGNOSIS — R000 Tachycardia, unspecified: Secondary | ICD-10-CM | POA: Diagnosis not present

## 2024-08-08 DIAGNOSIS — R9082 White matter disease, unspecified: Secondary | ICD-10-CM | POA: Diagnosis not present

## 2024-08-08 DIAGNOSIS — I513 Intracardiac thrombosis, not elsewhere classified: Secondary | ICD-10-CM | POA: Diagnosis present

## 2024-08-08 DIAGNOSIS — I5020 Unspecified systolic (congestive) heart failure: Secondary | ICD-10-CM | POA: Diagnosis not present

## 2024-08-08 DIAGNOSIS — J439 Emphysema, unspecified: Secondary | ICD-10-CM | POA: Diagnosis not present

## 2024-08-08 DIAGNOSIS — S065XAD Traumatic subdural hemorrhage with loss of consciousness status unknown, subsequent encounter: Secondary | ICD-10-CM | POA: Diagnosis not present

## 2024-08-08 DIAGNOSIS — Z86711 Personal history of pulmonary embolism: Secondary | ICD-10-CM

## 2024-08-08 DIAGNOSIS — G9341 Metabolic encephalopathy: Secondary | ICD-10-CM | POA: Diagnosis present

## 2024-08-08 DIAGNOSIS — R7989 Other specified abnormal findings of blood chemistry: Secondary | ICD-10-CM | POA: Diagnosis not present

## 2024-08-08 DIAGNOSIS — I639 Cerebral infarction, unspecified: Secondary | ICD-10-CM | POA: Diagnosis not present

## 2024-08-08 DIAGNOSIS — J449 Chronic obstructive pulmonary disease, unspecified: Secondary | ICD-10-CM | POA: Diagnosis present

## 2024-08-08 DIAGNOSIS — I110 Hypertensive heart disease with heart failure: Principal | ICD-10-CM | POA: Diagnosis present

## 2024-08-08 DIAGNOSIS — I6200 Nontraumatic subdural hemorrhage, unspecified: Secondary | ICD-10-CM | POA: Diagnosis not present

## 2024-08-08 DIAGNOSIS — I081 Rheumatic disorders of both mitral and tricuspid valves: Secondary | ICD-10-CM | POA: Diagnosis present

## 2024-08-08 DIAGNOSIS — I700 Atherosclerosis of aorta: Secondary | ICD-10-CM | POA: Diagnosis not present

## 2024-08-08 DIAGNOSIS — K219 Gastro-esophageal reflux disease without esophagitis: Secondary | ICD-10-CM | POA: Diagnosis present

## 2024-08-08 DIAGNOSIS — Z87828 Personal history of other (healed) physical injury and trauma: Secondary | ICD-10-CM

## 2024-08-08 DIAGNOSIS — B20 Human immunodeficiency virus [HIV] disease: Secondary | ICD-10-CM | POA: Diagnosis present

## 2024-08-08 DIAGNOSIS — I517 Cardiomegaly: Secondary | ICD-10-CM | POA: Diagnosis not present

## 2024-08-08 DIAGNOSIS — M419 Scoliosis, unspecified: Secondary | ICD-10-CM | POA: Diagnosis present

## 2024-08-08 DIAGNOSIS — J4489 Other specified chronic obstructive pulmonary disease: Secondary | ICD-10-CM | POA: Diagnosis present

## 2024-08-08 DIAGNOSIS — M7989 Other specified soft tissue disorders: Secondary | ICD-10-CM | POA: Diagnosis not present

## 2024-08-08 DIAGNOSIS — I6782 Cerebral ischemia: Secondary | ICD-10-CM | POA: Diagnosis not present

## 2024-08-08 DIAGNOSIS — F419 Anxiety disorder, unspecified: Secondary | ICD-10-CM | POA: Diagnosis present

## 2024-08-08 DIAGNOSIS — Z833 Family history of diabetes mellitus: Secondary | ICD-10-CM

## 2024-08-08 DIAGNOSIS — M159 Polyosteoarthritis, unspecified: Secondary | ICD-10-CM | POA: Diagnosis present

## 2024-08-08 DIAGNOSIS — F17210 Nicotine dependence, cigarettes, uncomplicated: Secondary | ICD-10-CM | POA: Diagnosis present

## 2024-08-08 DIAGNOSIS — R2241 Localized swelling, mass and lump, right lower limb: Secondary | ICD-10-CM | POA: Diagnosis not present

## 2024-08-08 DIAGNOSIS — Z8711 Personal history of peptic ulcer disease: Secondary | ICD-10-CM

## 2024-08-08 DIAGNOSIS — I959 Hypotension, unspecified: Secondary | ICD-10-CM | POA: Diagnosis not present

## 2024-08-08 DIAGNOSIS — F1910 Other psychoactive substance abuse, uncomplicated: Secondary | ICD-10-CM | POA: Diagnosis not present

## 2024-08-08 DIAGNOSIS — R0789 Other chest pain: Secondary | ICD-10-CM | POA: Diagnosis not present

## 2024-08-08 DIAGNOSIS — Z21 Asymptomatic human immunodeficiency virus [HIV] infection status: Secondary | ICD-10-CM | POA: Diagnosis present

## 2024-08-08 DIAGNOSIS — M25572 Pain in left ankle and joints of left foot: Secondary | ICD-10-CM | POA: Diagnosis not present

## 2024-08-08 DIAGNOSIS — R29818 Other symptoms and signs involving the nervous system: Secondary | ICD-10-CM | POA: Diagnosis not present

## 2024-08-08 DIAGNOSIS — I5023 Acute on chronic systolic (congestive) heart failure: Secondary | ICD-10-CM | POA: Diagnosis not present

## 2024-08-08 DIAGNOSIS — G319 Degenerative disease of nervous system, unspecified: Secondary | ICD-10-CM | POA: Diagnosis not present

## 2024-08-08 LAB — CBC WITH DIFFERENTIAL/PLATELET
Abs Immature Granulocytes: 0.1 K/uL — ABNORMAL HIGH (ref 0.00–0.07)
Basophils Absolute: 0.1 K/uL (ref 0.0–0.1)
Basophils Relative: 1 %
Eosinophils Absolute: 0.1 K/uL (ref 0.0–0.5)
Eosinophils Relative: 1 %
HCT: 43.5 % (ref 39.0–52.0)
Hemoglobin: 13.5 g/dL (ref 13.0–17.0)
Immature Granulocytes: 1 %
Lymphocytes Relative: 18 %
Lymphs Abs: 1.3 K/uL (ref 0.7–4.0)
MCH: 28.9 pg (ref 26.0–34.0)
MCHC: 31 g/dL (ref 30.0–36.0)
MCV: 93.1 fL (ref 80.0–100.0)
Monocytes Absolute: 0.7 K/uL (ref 0.1–1.0)
Monocytes Relative: 9 %
Neutro Abs: 5.1 K/uL (ref 1.7–7.7)
Neutrophils Relative %: 70 %
Platelets: 404 K/uL — ABNORMAL HIGH (ref 150–400)
RBC: 4.67 MIL/uL (ref 4.22–5.81)
RDW: 14.8 % (ref 11.5–15.5)
WBC: 7.3 K/uL (ref 4.0–10.5)
nRBC: 0 % (ref 0.0–0.2)

## 2024-08-08 LAB — URINALYSIS, ROUTINE W REFLEX MICROSCOPIC
Bilirubin Urine: NEGATIVE
Glucose, UA: 50 mg/dL — AB
Ketones, ur: NEGATIVE mg/dL
Nitrite: POSITIVE — AB
Protein, ur: 30 mg/dL — AB
Specific Gravity, Urine: >1.046 — ABNORMAL HIGH (ref 1.005–1.030)
pH: 5 (ref 5.0–8.0)

## 2024-08-08 LAB — RAPID URINE DRUG SCREEN, HOSP PERFORMED
Amphetamines: POSITIVE — AB
Barbiturates: NOT DETECTED
Benzodiazepines: NOT DETECTED
Cocaine: NOT DETECTED
Opiates: NOT DETECTED
Tetrahydrocannabinol: NOT DETECTED

## 2024-08-08 LAB — I-STAT VENOUS BLOOD GAS, ED
Acid-Base Excess: 2 mmol/L (ref 0.0–2.0)
Bicarbonate: 26.3 mmol/L (ref 20.0–28.0)
Calcium, Ion: 1.07 mmol/L — ABNORMAL LOW (ref 1.15–1.40)
HCT: 43 % (ref 39.0–52.0)
Hemoglobin: 14.6 g/dL (ref 13.0–17.0)
O2 Saturation: 42 %
Potassium: 4.1 mmol/L (ref 3.5–5.1)
Sodium: 135 mmol/L (ref 135–145)
TCO2: 27 mmol/L (ref 22–32)
pCO2, Ven: 38.5 mmHg — ABNORMAL LOW (ref 44–60)
pH, Ven: 7.442 — ABNORMAL HIGH (ref 7.25–7.43)
pO2, Ven: 23 mmHg — CL (ref 32–45)

## 2024-08-08 LAB — COMPREHENSIVE METABOLIC PANEL WITH GFR
ALT: 28 U/L (ref 0–44)
AST: 32 U/L (ref 15–41)
Albumin: 3 g/dL — ABNORMAL LOW (ref 3.5–5.0)
Alkaline Phosphatase: 109 U/L (ref 38–126)
Anion gap: 16 — ABNORMAL HIGH (ref 5–15)
BUN: 25 mg/dL — ABNORMAL HIGH (ref 8–23)
CO2: 23 mmol/L (ref 22–32)
Calcium: 8.9 mg/dL (ref 8.9–10.3)
Chloride: 97 mmol/L — ABNORMAL LOW (ref 98–111)
Creatinine, Ser: 0.97 mg/dL (ref 0.61–1.24)
GFR, Estimated: >60 mL/min (ref >60)
Glucose, Bld: 143 mg/dL — ABNORMAL HIGH (ref 70–99)
Potassium: 4.1 mmol/L (ref 3.5–5.1)
Sodium: 136 mmol/L (ref 135–145)
Total Bilirubin: 0.7 mg/dL (ref 0.0–1.2)
Total Protein: 8 g/dL (ref 6.5–8.1)

## 2024-08-08 LAB — CBG MONITORING, ED: Glucose-Capillary: 147 mg/dL — ABNORMAL HIGH (ref 70–99)

## 2024-08-08 LAB — ETHANOL: Alcohol, Ethyl (B): <15 mg/dL (ref <15)

## 2024-08-08 LAB — SALICYLATE LEVEL: Salicylate Lvl: <7 mg/dL — ABNORMAL LOW (ref 7.0–30.0)

## 2024-08-08 LAB — TROPONIN I (HIGH SENSITIVITY)
Troponin I (High Sensitivity): 72 ng/L — ABNORMAL HIGH (ref <18)
Troponin I (High Sensitivity): 65 ng/L — ABNORMAL HIGH (ref <18)

## 2024-08-08 LAB — ACETAMINOPHEN LEVEL: Acetaminophen (Tylenol), Serum: <10 ug/mL — ABNORMAL LOW (ref 10–30)

## 2024-08-08 LAB — LIPASE, BLOOD: Lipase: 57 U/L — ABNORMAL HIGH (ref 11–51)

## 2024-08-08 LAB — AMMONIA: Ammonia: 19 umol/L (ref 9–35)

## 2024-08-08 MED ORDER — SODIUM CHLORIDE 0.9% FLUSH
3.0000 mL | Freq: Two times a day (BID) | INTRAVENOUS | Status: DC
  Administered 2024-08-08 – 2024-08-13 (×11): 3 mL via INTRAVENOUS

## 2024-08-08 MED ORDER — PANTOPRAZOLE SODIUM 40 MG PO TBEC
40.0000 mg | DELAYED_RELEASE_TABLET | Freq: Every day | ORAL | Status: DC
  Administered 2024-08-09 – 2024-08-13 (×5): 40 mg via ORAL
  Filled 2024-08-08 (×5): qty 1

## 2024-08-08 MED ORDER — THIAMINE HCL 100 MG/ML IJ SOLN
100.0000 mg | Freq: Every day | INTRAMUSCULAR | Status: DC
  Administered 2024-08-08: 100 mg via INTRAVENOUS
  Filled 2024-08-08: qty 2

## 2024-08-08 MED ORDER — LORAZEPAM 2 MG/ML IJ SOLN: 0.0000 mg | Freq: Four times a day (QID) | INTRAMUSCULAR | Status: DC

## 2024-08-08 MED ORDER — FUROSEMIDE 10 MG/ML IJ SOLN: 40.0000 mg | Freq: Once | INTRAMUSCULAR | Status: DC

## 2024-08-08 MED ORDER — THIAMINE MONONITRATE 100 MG PO TABS
100.0000 mg | ORAL_TABLET | Freq: Every day | ORAL | Status: DC
  Administered 2024-08-09 – 2024-08-11 (×3): 100 mg via ORAL
  Filled 2024-08-08 (×3): qty 1

## 2024-08-08 MED ORDER — OLANZAPINE 5 MG PO TABS
5.0000 mg | ORAL_TABLET | Freq: Every day | ORAL | Status: DC
  Administered 2024-08-08 – 2024-08-12 (×5): 5 mg via ORAL
  Filled 2024-08-08 (×7): qty 1

## 2024-08-08 MED ORDER — LORAZEPAM 2 MG/ML IJ SOLN: 0.0000 mg | Freq: Two times a day (BID) | INTRAMUSCULAR | Status: DC

## 2024-08-08 MED ORDER — IOHEXOL 350 MG/ML SOLN
75.0000 mL | Freq: Once | INTRAVENOUS | Status: AC | PRN
  Administered 2024-08-08: 75 mL via INTRAVENOUS

## 2024-08-08 MED ORDER — LORAZEPAM 1 MG PO TABS
0.0000 mg | ORAL_TABLET | Freq: Four times a day (QID) | ORAL | Status: DC
  Administered 2024-08-08 – 2024-08-09 (×3): 1 mg via ORAL
  Filled 2024-08-08 (×3): qty 1

## 2024-08-08 MED ORDER — SPIRONOLACTONE 25 MG PO TABS
25.0000 mg | ORAL_TABLET | Freq: Every day | ORAL | Status: DC
  Administered 2024-08-09 – 2024-08-13 (×5): 25 mg via ORAL
  Filled 2024-08-08 (×5): qty 1

## 2024-08-08 MED ORDER — APIXABAN 5 MG PO TABS
5.0000 mg | ORAL_TABLET | Freq: Two times a day (BID) | ORAL | Status: DC
  Administered 2024-08-08 – 2024-08-12 (×9): 5 mg via ORAL
  Filled 2024-08-08 (×9): qty 1

## 2024-08-08 MED ORDER — BUSPIRONE HCL 5 MG PO TABS
10.0000 mg | ORAL_TABLET | Freq: Three times a day (TID) | ORAL | Status: DC
  Administered 2024-08-08 – 2024-08-13 (×15): 10 mg via ORAL
  Filled 2024-08-08 (×5): qty 1
  Filled 2024-08-08: qty 2
  Filled 2024-08-08 (×3): qty 1
  Filled 2024-08-08: qty 2
  Filled 2024-08-08 (×5): qty 1

## 2024-08-08 MED ORDER — LORAZEPAM 1 MG PO TABS: 0.0000 mg | ORAL_TABLET | Freq: Two times a day (BID) | ORAL | Status: DC

## 2024-08-08 MED ORDER — DULOXETINE HCL 60 MG PO CPEP
60.0000 mg | ORAL_CAPSULE | Freq: Every day | ORAL | Status: AC
Start: 2024-08-09 — End: ?
  Administered 2024-08-09 – 2024-08-13 (×5): 60 mg via ORAL
  Filled 2024-08-08 (×5): qty 1

## 2024-08-08 MED ORDER — DIGOXIN 125 MCG PO TABS
0.1250 mg | ORAL_TABLET | Freq: Every day | ORAL | Status: DC
  Administered 2024-08-08 – 2024-08-13 (×6): 0.125 mg via ORAL
  Filled 2024-08-08 (×6): qty 1

## 2024-08-08 MED ORDER — ONDANSETRON HCL 4 MG/2ML IJ SOLN: 4.0000 mg | Freq: Four times a day (QID) | INTRAMUSCULAR | Status: DC | PRN

## 2024-08-08 MED ORDER — ACETAMINOPHEN 325 MG PO TABS
650.0000 mg | ORAL_TABLET | ORAL | Status: DC | PRN
  Administered 2024-08-08 – 2024-08-13 (×3): 650 mg via ORAL
  Filled 2024-08-08 (×3): qty 2

## 2024-08-08 MED ORDER — FUROSEMIDE 10 MG/ML IJ SOLN
40.0000 mg | Freq: Once | INTRAMUSCULAR | Status: AC
  Administered 2024-08-08: 40 mg via INTRAVENOUS
  Filled 2024-08-08: qty 4

## 2024-08-08 MED ORDER — IVABRADINE HCL 5 MG PO TABS
5.0000 mg | ORAL_TABLET | Freq: Two times a day (BID) | ORAL | Status: DC
  Administered 2024-08-08 – 2024-08-13 (×9): 5 mg via ORAL
  Filled 2024-08-08 (×12): qty 1

## 2024-08-08 MED ORDER — SODIUM CHLORIDE 0.9% FLUSH: 3.0000 mL | INTRAVENOUS | Status: DC | PRN

## 2024-08-08 MED ORDER — SODIUM CHLORIDE 0.9 % IV SOLN: 250.0000 mL | INTRAVENOUS | Status: DC | PRN

## 2024-08-08 MED ORDER — BICTEGRAVIR-EMTRICITAB-TENOFOV 50-200-25 MG PO TABS
1.0000 | ORAL_TABLET | Freq: Every day | ORAL | Status: DC
  Administered 2024-08-08 – 2024-08-13 (×6): 1 via ORAL
  Filled 2024-08-08 (×6): qty 1

## 2024-08-08 MED ORDER — CARVEDILOL 3.125 MG PO TABS
3.1250 mg | ORAL_TABLET | Freq: Two times a day (BID) | ORAL | Status: DC
  Administered 2024-08-08 – 2024-08-13 (×8): 3.125 mg via ORAL
  Filled 2024-08-08 (×8): qty 1

## 2024-08-08 MED ORDER — TORSEMIDE 20 MG PO TABS
20.0000 mg | ORAL_TABLET | Freq: Every day | ORAL | Status: DC
  Administered 2024-08-08 – 2024-08-13 (×6): 20 mg via ORAL
  Filled 2024-08-08 (×6): qty 1

## 2024-08-08 MED ORDER — DAPAGLIFLOZIN PROPANEDIOL 10 MG PO TABS
10.0000 mg | ORAL_TABLET | Freq: Every day | ORAL | Status: AC
Start: 2024-08-09 — End: ?
  Administered 2024-08-09 – 2024-08-13 (×5): 10 mg via ORAL
  Filled 2024-08-08 (×5): qty 1

## 2024-08-08 MED ORDER — SACUBITRIL-VALSARTAN 24-26 MG PO TABS
1.0000 | ORAL_TABLET | Freq: Two times a day (BID) | ORAL | Status: DC
  Administered 2024-08-08 – 2024-08-09 (×2): 1 via ORAL
  Filled 2024-08-08 (×2): qty 1

## 2024-08-08 NOTE — ED Notes (Signed)
 Patient transported to CT

## 2024-08-08 NOTE — ED Notes (Signed)
 Pt given a [place] sandwich bag with 2 apple juice cups.

## 2024-08-08 NOTE — ED Notes (Signed)
 Pt given 2 apple juice cups and 1 cola. Pt did not like the cola. Cola was thrown away

## 2024-08-08 NOTE — ED Notes (Signed)
 CCMD called and verified patient on cardiac telemetry
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
## 2024-08-08 NOTE — ED Triage Notes (Addendum)
 Pt was here yesterday and LWBS. Pt made several attempts to get in touch with his son and daughter to come pick him up but could not get a hold of anyone. Pt slept outside all night. Pt requesting food and a way to get home. Pt lives in [place], [place]
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
## 2024-08-08 NOTE — ED Notes (Signed)
 Pt given sandwich bag and decaf coffee

## 2024-08-09 DIAGNOSIS — I5020 Unspecified systolic (congestive) heart failure: Secondary | ICD-10-CM

## 2024-08-09 LAB — BASIC METABOLIC PANEL WITH GFR
Anion gap: 10 (ref 5–15)
BUN: 23 mg/dL (ref 8–23)
CO2: 27 mmol/L (ref 22–32)
Calcium: 8.4 mg/dL — ABNORMAL LOW (ref 8.9–10.3)
Chloride: 96 mmol/L — ABNORMAL LOW (ref 98–111)
Creatinine, Ser: 1.19 mg/dL (ref 0.61–1.24)
GFR, Estimated: >60 mL/min (ref >60)
Glucose, Bld: 113 mg/dL — ABNORMAL HIGH (ref 70–99)
Potassium: 4.1 mmol/L (ref 3.5–5.1)
Sodium: 133 mmol/L — ABNORMAL LOW (ref 135–145)

## 2024-08-09 LAB — C-REACTIVE PROTEIN: CRP: 3.4 mg/dL — ABNORMAL HIGH (ref <1.0)

## 2024-08-09 MED ORDER — ORAL CARE MOUTH RINSE: 15.0000 mL | OROMUCOSAL | Status: DC | PRN

## 2024-08-09 NOTE — ED Notes (Signed)
 Dr Fairy is made aware patient blood pressure been in the mid 80's and 90's

## 2024-08-09 NOTE — ED Notes (Signed)
 Patient was not given is even Coreg  because of low blood pressure. Dr Fairy was notified

## 2024-08-09 NOTE — ED Notes (Signed)
 Flavia, RN notified Dr. Fairy at 13:30 of pt's BP. No new orders received.

## 2024-08-09 NOTE — ED Notes (Signed)
 Pt sitting up eating lunch at this time
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
## 2024-08-09 NOTE — Progress Notes (Signed)
 Heart Failure Navigator Progress Note  Assessed for Heart & Vascular TOC clinic readiness.  Patient does not meet criteria due to Advanced Heart Failure Team patient of Dr. Shirlee Latch.   Navigator will sign off at this time.    Rhae Hammock, BSN, Scientist, clinical (histocompatibility and immunogenetics) Only

## 2024-08-10 ENCOUNTER — Inpatient Hospital Stay (HOSPITAL_COMMUNITY)

## 2024-08-10 ENCOUNTER — Encounter: Admitting: Infectious Diseases

## 2024-08-10 DIAGNOSIS — I5020 Unspecified systolic (congestive) heart failure: Secondary | ICD-10-CM | POA: Diagnosis not present

## 2024-08-10 DIAGNOSIS — R2241 Localized swelling, mass and lump, right lower limb: Secondary | ICD-10-CM | POA: Diagnosis not present

## 2024-08-10 LAB — BASIC METABOLIC PANEL WITH GFR
Anion gap: 12 (ref 5–15)
BUN: 23 mg/dL (ref 8–23)
CO2: 26 mmol/L (ref 22–32)
Calcium: 8.2 mg/dL — ABNORMAL LOW (ref 8.9–10.3)
Chloride: 94 mmol/L — ABNORMAL LOW (ref 98–111)
Creatinine, Ser: 1.3 mg/dL — ABNORMAL HIGH (ref 0.61–1.24)
GFR, Estimated: >60 mL/min (ref >60)
Glucose, Bld: 132 mg/dL — ABNORMAL HIGH (ref 70–99)
Potassium: 4.1 mmol/L (ref 3.5–5.1)
Sodium: 132 mmol/L — ABNORMAL LOW (ref 135–145)

## 2024-08-10 LAB — CBC
HCT: 43 % (ref 39.0–52.0)
Hemoglobin: 13.8 g/dL (ref 13.0–17.0)
MCH: 28.7 pg (ref 26.0–34.0)
MCHC: 32.1 g/dL (ref 30.0–36.0)
MCV: 89.4 fL (ref 80.0–100.0)
Platelets: 444 K/uL — ABNORMAL HIGH (ref 150–400)
RBC: 4.81 MIL/uL (ref 4.22–5.81)
RDW: 14.6 % (ref 11.5–15.5)
WBC: 6.7 K/uL (ref 4.0–10.5)
nRBC: 0 % (ref 0.0–0.2)
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
## 2024-08-10 NOTE — Discharge Instructions (Addendum)
 Transportation by: [facility]ability[facility] [place] ([facility]) Next Steps:   Call [phone] to schedule an appointment.   Call [phone]. [phone] to get more info.  [facility] ([facility]) by: [facility] [place] Next Steps:   Call [phone] to get more info.  Disaster Services by: [facility] [place]  [facility] Next Steps:   Call [phone] (your nearest office) to get more info.  [facility] by: [facility] Next Steps:   Call [phone] to get more info.   Email [email or web address] to get more info.
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
## 2024-08-11 DIAGNOSIS — F1994 Other psychoactive substance use, unspecified with psychoactive substance-induced mood disorder: Secondary | ICD-10-CM | POA: Diagnosis not present

## 2024-08-11 DIAGNOSIS — I10 Essential (primary) hypertension: Secondary | ICD-10-CM | POA: Diagnosis not present

## 2024-08-11 DIAGNOSIS — J439 Emphysema, unspecified: Secondary | ICD-10-CM | POA: Diagnosis not present

## 2024-08-11 DIAGNOSIS — I5023 Acute on chronic systolic (congestive) heart failure: Secondary | ICD-10-CM | POA: Diagnosis not present

## 2024-08-11 LAB — URINE CULTURE: Culture: >=100000 — AB

## 2024-08-11 LAB — BASIC METABOLIC PANEL WITH GFR
Anion gap: 10 (ref 5–15)
BUN: 22 mg/dL (ref 8–23)
CO2: 29 mmol/L (ref 22–32)
Calcium: 8.4 mg/dL — ABNORMAL LOW (ref 8.9–10.3)
Chloride: 96 mmol/L — ABNORMAL LOW (ref 98–111)
Creatinine, Ser: 1.11 mg/dL (ref 0.61–1.24)
GFR, Estimated: >60 mL/min (ref >60)
Glucose, Bld: 89 mg/dL (ref 70–99)
Potassium: 3.7 mmol/L (ref 3.5–5.1)
Sodium: 135 mmol/L (ref 135–145)

## 2024-08-11 LAB — HIV-1 RNA QUANT-NO REFLEX-BLD
HIV 1 RNA Quant: 100 {copies}/mL
LOG10 HIV-1 RNA: 2 {Log_copies}/mL

## 2024-08-11 MED ORDER — SACUBITRIL-VALSARTAN 24-26 MG PO TABS
1.0000 | ORAL_TABLET | Freq: Two times a day (BID) | ORAL | Status: DC
  Administered 2024-08-12 – 2024-08-13 (×2): 1 via ORAL
  Filled 2024-08-11 (×5): qty 1

## 2024-08-11 NOTE — Assessment & Plan Note (Signed)
Continue anticoagulation with apixaban.  ?

## 2024-08-11 NOTE — Plan of Care (Signed)
  Problem: Clinical Measurements: Goal: Ability to maintain clinical measurements within normal limits will improve Outcome: Progressing Goal: Cardiovascular complication will be avoided Outcome: Progressing   Problem: Pain Managment: Goal: General experience of comfort will improve and/or be controlled Outcome: Progressing   Problem: Safety: Goal: Ability to remain free from injury will improve Outcome: Progressing
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
## 2024-08-11 NOTE — Progress Notes (Signed)
 PT Cancellation Note  Patient Details Name: Devin [name] MRN: [id number] DOB: 1961/10/13   Cancelled Treatment:    Reason Eval/Treat Not Completed: Other (comment). Refuses until he has a shower. Will continue attempts. Amb earlier with mobility.   Rodgers ORN [facility] 08/11/2024, 2:40 PM Rodgers Opal PT Acute [employer] [phone]

## 2024-08-11 NOTE — Progress Notes (Signed)
 Mobility Specialist Progress Note:   08/11/24 1105  Mobility  Activity Ambulated with assistance  Level of Assistance Contact guard assist, steadying assist  Assistive Device None  Distance Ambulated (ft) 300 ft  Activity Response Tolerated well  Mobility Referral Yes  Mobility visit 1 Mobility  Mobility Specialist Start Time (ACUTE ONLY) 1105  Mobility Specialist Stop Time (ACUTE ONLY) 1115  Mobility Specialist Time Calculation (min) (ACUTE ONLY) 10 min   Pt agreeable to mobility session. Required CGA to ambulate with no AD. No overt LOB noted. Pt with no c/o throughout. Back in bed with all needs met.   Therisa Rana Mobility Specialist Please contact via SecureChat or  Rehab office at [phone]
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
## 2024-08-11 NOTE — Assessment & Plan Note (Signed)
Old records personally reviewed, neurology follow up from 07/2021. Seropositive ocular myasthenia gravis, no significant bulbar, or limb weakness noted.  He is not longer on Mestinon or prednisone, never treated with long term steroids sparing agent.   

## 2024-08-11 NOTE — Assessment & Plan Note (Signed)
Continue with antiretroviral therapy.  

## 2024-08-11 NOTE — Assessment & Plan Note (Addendum)
 Continue with close follow up as outpatient Unfortunately, referral for substance abuse needs to be done as outpatient Patient is refusing drug rehab   Patient has been evaluated by psychiatry on prior hospitalization. Clinically has not change from prior admission. Plan to continue olanzapine   Added alprazolam with good toleration.  Will need close follow up.  He has declined home health.   Clinically he demonstrates knowledge of his medical condition, he can tell the consequences of not taking his medications and abusing amphetamines, including worsening heart failure and death.  He would like to go home and will follow up as outpatient.

## 2024-08-11 NOTE — Assessment & Plan Note (Addendum)
 Echocardiogram with reduced LV systolic function with EF 10 to 15 %. Clinically compensated.  Continue digoxin , ivabradine, sglt2 inh, spironolactone  and diuresis with torsemide .  Entresto  for afterload reduction Will need outpatient follow up

## 2024-08-11 NOTE — Assessment & Plan Note (Signed)
 Outpatient pt and ot
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
## 2024-08-11 NOTE — TOC Progression Note (Signed)
 Transition of Care [facility]) - Progression Note    Patient Details  Name: Devin [name] MRN: [id number] Date of Birth: 10-01-1961  Transition of Care [facility]) CM/SW Contact  [name], [place] Phone Number: 08/11/2024, 11:05 AM  Clinical Narrative:   CSW provided patient with community resources for [facility] from [email or web address].   CSW will continue to follow.    Expected Discharge Plan: Home w Home Health Services Barriers to Discharge: Continued Medical Work up               Expected Discharge Plan and Services In-house Referral: Clinical Social Work Discharge Planning Services: CM Consult Post Acute Care Choice: NA Living arrangements for the past 2 months: Single Family Home                 DME Arranged: N/A DME Agency: NA, [facility]       HH Arranged: NA           Social Drivers of Health (SDOH) Interventions SDOH Screenings   Food Insecurity: Food Insecurity Present (08/09/2024)  Housing: Low Risk  (08/09/2024)  Transportation Needs: Unmet Transportation Needs (08/09/2024)  Utilities: Not At Risk (08/09/2024)  Alcohol Screen: Low Risk  (03/22/2024)  Depression (PHQ2-9): Low Risk  (04/04/2022)  Social Connections: Unknown (03/04/2024)  Tobacco Use: High Risk (08/08/2024)    Readmission Risk Interventions    08/10/2024    3:14 PM 03/05/2024   10:39 AM  Readmission Risk Prevention Plan  Transportation Screening Complete Complete  HRI or Home Care Consult  Complete  Social Work Consult for Recovery Care Planning/Counseling  Complete  Palliative Care Screening  Not Applicable  Medication Review [employer]) Complete Complete  PCP or Specialist appointment within 3-5 days of discharge Complete   HRI or Home Care Consult Complete   Palliative Care Screening Not Applicable   Skilled Nursing Facility Not Applicable

## 2024-08-11 NOTE — Plan of Care (Signed)
   Problem: Health Behavior/Discharge Planning: Goal: Ability to manage health-related needs will improve Outcome: Progressing

## 2024-08-11 NOTE — Assessment & Plan Note (Signed)
 Continue blood pressure control with entresto.

## 2024-08-12 ENCOUNTER — Encounter (HOSPITAL_COMMUNITY)

## 2024-08-12 ENCOUNTER — Inpatient Hospital Stay (HOSPITAL_COMMUNITY)

## 2024-08-12 DIAGNOSIS — G319 Degenerative disease of nervous system, unspecified: Secondary | ICD-10-CM | POA: Diagnosis not present

## 2024-08-12 DIAGNOSIS — J439 Emphysema, unspecified: Secondary | ICD-10-CM | POA: Diagnosis not present

## 2024-08-12 DIAGNOSIS — F1994 Other psychoactive substance use, unspecified with psychoactive substance-induced mood disorder: Secondary | ICD-10-CM | POA: Diagnosis not present

## 2024-08-12 DIAGNOSIS — I639 Cerebral infarction, unspecified: Secondary | ICD-10-CM

## 2024-08-12 DIAGNOSIS — R29818 Other symptoms and signs involving the nervous system: Secondary | ICD-10-CM | POA: Diagnosis not present

## 2024-08-12 DIAGNOSIS — I10 Essential (primary) hypertension: Secondary | ICD-10-CM | POA: Diagnosis not present

## 2024-08-12 DIAGNOSIS — R9082 White matter disease, unspecified: Secondary | ICD-10-CM | POA: Diagnosis not present

## 2024-08-12 DIAGNOSIS — I5023 Acute on chronic systolic (congestive) heart failure: Secondary | ICD-10-CM | POA: Diagnosis not present

## 2024-08-12 LAB — BASIC METABOLIC PANEL WITH GFR
Anion gap: 10 (ref 5–15)
BUN: 19 mg/dL (ref 8–23)
CO2: 29 mmol/L (ref 22–32)
Calcium: 8.7 mg/dL — ABNORMAL LOW (ref 8.9–10.3)
Chloride: 95 mmol/L — ABNORMAL LOW (ref 98–111)
Creatinine, Ser: 1.11 mg/dL (ref 0.61–1.24)
GFR, Estimated: >60 mL/min (ref >60)
Glucose, Bld: 120 mg/dL — ABNORMAL HIGH (ref 70–99)
Potassium: 3.6 mmol/L (ref 3.5–5.1)
Sodium: 134 mmol/L — ABNORMAL LOW (ref 135–145)

## 2024-08-12 MED ORDER — ALPRAZOLAM 0.5 MG PO TABS
1.0000 mg | ORAL_TABLET | Freq: Three times a day (TID) | ORAL | Status: DC | PRN
  Administered 2024-08-12: 1 mg via ORAL
  Filled 2024-08-12 (×2): qty 2

## 2024-08-12 MED ORDER — ATORVASTATIN CALCIUM 80 MG PO TABS
80.0000 mg | ORAL_TABLET | Freq: Every day | ORAL | Status: DC
  Administered 2024-08-13: 80 mg via ORAL
  Filled 2024-08-12 (×2): qty 1
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
## 2024-08-12 NOTE — Plan of Care (Signed)

## 2024-08-12 NOTE — Care Management Important Message (Signed)
 Important Message  Patient Details  Name: Deklen Popelka Chronis MRN: [id number] Date of Birth: 02/11/1961   Important Message Given:  Yes - Medicare IM     [name] 08/12/2024, 10:26 AM

## 2024-08-12 NOTE — Progress Notes (Signed)
 Mobility Specialist Progress Note:    08/12/24 1005  Mobility  Activity Ambulated with assistance  Level of Assistance Standby assist, set-up cues, supervision of patient - no hands on  Assistive Device None  Distance Ambulated (ft) 200 ft  Activity Response Tolerated well  Mobility Referral Yes  Mobility visit 1 Mobility  Mobility Specialist Start Time (ACUTE ONLY) 1005  Mobility Specialist Stop Time (ACUTE ONLY) 1022  Mobility Specialist Time Calculation (min) (ACUTE ONLY) 17 min   Received pt laying in bed agreeable to session. Pt moving and ambulating well. No c/o any symptoms. Pt surprised at how well they were moving and not having to utilize equipment. Returned pt to room w/ all needs met.   Venetia Keel Mobility Specialist Please Neurosurgeon or Rehab Office at [phone]

## 2024-08-12 NOTE — Assessment & Plan Note (Addendum)
 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   Plan to continue anticoagulation with apixaban  and added high intensity statin. Discussed with Neurology.   Brain MRI with interval development of small subdural hematomas overlying the occipital and temporal lobes bilaterally, measuring up to approximately 3 mm in thickness.  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  Neurosurgery consulted and risk vs benefit decision to continue anticoagulation with apixaban .

## 2024-08-13 ENCOUNTER — Inpatient Hospital Stay (HOSPITAL_COMMUNITY)

## 2024-08-13 ENCOUNTER — Other Ambulatory Visit (HOSPITAL_COMMUNITY): Payer: Self-pay

## 2024-08-13 DIAGNOSIS — R9082 White matter disease, unspecified: Secondary | ICD-10-CM | POA: Diagnosis not present

## 2024-08-13 DIAGNOSIS — F1994 Other psychoactive substance use, unspecified with psychoactive substance-induced mood disorder: Secondary | ICD-10-CM | POA: Diagnosis not present

## 2024-08-13 DIAGNOSIS — I6200 Nontraumatic subdural hemorrhage, unspecified: Secondary | ICD-10-CM | POA: Diagnosis not present

## 2024-08-13 DIAGNOSIS — J439 Emphysema, unspecified: Secondary | ICD-10-CM | POA: Diagnosis not present

## 2024-08-13 DIAGNOSIS — I5023 Acute on chronic systolic (congestive) heart failure: Secondary | ICD-10-CM | POA: Diagnosis not present

## 2024-08-13 DIAGNOSIS — I10 Essential (primary) hypertension: Secondary | ICD-10-CM | POA: Diagnosis not present

## 2024-08-13 DIAGNOSIS — I639 Cerebral infarction, unspecified: Secondary | ICD-10-CM

## 2024-08-13 DIAGNOSIS — I6782 Cerebral ischemia: Secondary | ICD-10-CM | POA: Diagnosis not present

## 2024-08-13 MED ORDER — ATORVASTATIN CALCIUM 40 MG PO TABS
40.0000 mg | ORAL_TABLET | Freq: Every day | ORAL | 0 refills | Status: DC
  Filled 2024-08-13: qty 30, 30d supply, fill #0

## 2024-08-13 MED ORDER — ALPRAZOLAM 1 MG PO TABS
1.0000 mg | ORAL_TABLET | Freq: Three times a day (TID) | ORAL | 0 refills | Status: AC | PRN
  Filled 2024-08-13: qty 10, 4d supply, fill #0

## 2024-08-13 NOTE — TOC Progression Note (Addendum)
 Transition of Care (TOC) - Progression Note   Patient for discharge today .   Patient from home with roommate   No PCP , sent message to CMA to arrange same.   Patient does not want home health arranged.   Patient will need transportation home. He has no one to provide transportation. He does have keys to get in his home. He confirmed address.   Patient signed transportation waiver   [facility] can transport patient to [place] today , they will need to be called for pick up no later then 4 pm. Patient will need to be ready when they arrive, they will not wait   Prescriptions sent to [facility]   NCM secure chatted MD, nurse and [facility]. Once NCM is notified patient ready and has medications. NCM will call [facility]    Nurse will have discharge lounge call safe transport  Patient Details  Name: Devin [name] MRN: [id number] Date of Birth: 1961/06/28  Transition of Care [facility]) CM/SW Contact  Walid Haig, Powell Jansky, RN Phone Number: 08/13/2024, 1:23 PM  Clinical Narrative:       Expected Discharge Plan: Home w Home Health Services Barriers to Discharge: Continued Medical Work up               Expected Discharge Plan and Services In-house Referral: Clinical Social Work Discharge Planning Services: CM Consult Post Acute Care Choice: NA Living arrangements for the past 2 months: Single Family Home Expected Discharge Date: 08/13/24               DME Arranged: N/A DME Agency: NA, [facility]       HH Arranged: NA           Social Drivers of Health (SDOH) Interventions SDOH Screenings   Food Insecurity: Food Insecurity Present (08/09/2024)  Housing: Low Risk  (08/09/2024)  Transportation Needs: Unmet Transportation Needs (08/09/2024)  Utilities: Not At Risk (08/09/2024)  Alcohol Screen: Low Risk  (03/22/2024)  Depression (PHQ2-9): Low Risk  (04/04/2022)  Social Connections: Unknown (03/04/2024)  Tobacco Use: High Risk (08/08/2024)    Readmission Risk  Interventions    08/10/2024    3:14 PM 03/05/2024   10:39 AM  Readmission Risk Prevention Plan  Transportation Screening Complete Complete  HRI or Home Care Consult  Complete  Social Work Consult for Recovery Care Planning/Counseling  Complete  Palliative Care Screening  Not Applicable  Medication Review Oceanographer) Complete Complete  PCP or Specialist appointment within 3-5 days of discharge Complete   HRI or Home Care Consult Complete   Palliative Care Screening Not Applicable   Skilled Nursing Facility Not Applicable

## 2024-08-13 NOTE — Progress Notes (Signed)
 Notified MD regarding diet change. Patient is requesting to change his diet
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
## 2024-08-13 NOTE — Plan of Care (Signed)

## 2024-08-13 NOTE — Progress Notes (Signed)
     Patient Name: Devin [name]           DOB: 1961/10/08  MRN: [id number]      Admission Date: 08/08/2024  Attending Provider: Noralee Elidia Sieving,*  Primary Diagnosis: Acute on chronic systolic CHF (congestive heart failure) (HCC)   Level of care: Med-Surg   OVERNIGHT EVENT   Notified by radiology regarding this patient's brain MRI.  Brain MRI findings- Interval development of small subdural hematomas overlying the occipital and temporal lobes bilaterally, measuring up to approximately 3 mm in thickness   Found to be altered on admission. Head CT 10/12- no acute intercranial findings. Signs of chronic small vessel ischemic disease with new focal subcortical infarct in the left posterior partial lobe, likely subacute to chronic.  Per Dr. Effie note -- CVA discussed with neuro, brain MRI was obtained.   Unable to independently evaluate pt as I am not physically at [facility]. I have spoken with Dr. Shona who has agreed to evaluate pt at bedside.  RN is reporting no focal neuro changes. A/O x4. No speech or vison changes. No motor or sensation change to extremities. No c/o of h/a.  Hx of PE and is currently on apixaban  BID. Last dose given 10/16 at 2157. Holding further doses given subdural hematomas.    Tracie Lindbloom, DNP, ACNPC- AG [facility] [place]
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
## 2024-08-13 NOTE — Progress Notes (Signed)
 PT Cancellation Note  Patient Details Name: Macai Sisneros Vancott MRN: [id number] DOB: [birth year]   Cancelled Treatment:    Reason Eval/Treat Not Completed: Patient declined, no reason specified (Pt declined PT treatment as he wanted to eat his lunch. Requested PT come back later today. Will follow-up as schedule permits.)  Randall SAUNDERS, PT, DPT Acute Rehabilitation Services Office: [phone] Secure Chat Preferred  Delon CHRISTELLA Callander 08/13/2024, 12:51 PM
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
## 2024-08-16 ENCOUNTER — Other Ambulatory Visit (HOSPITAL_COMMUNITY): Payer: Self-pay

## 2024-08-19 ENCOUNTER — Ambulatory Visit: Admitting: Family Medicine

## 2024-08-23 ENCOUNTER — Telehealth (HOSPITAL_COMMUNITY): Payer: Self-pay

## 2024-08-23 NOTE — Telephone Encounter (Signed)
 Called to confirm/remind patient of their appointment at [facility] on 08/24/24 10:00.   Appointment:   [] Confirmed  [] Left mess   [] No answer/No voice mail  [] VM Full/unable to leave message  [x] Phone not in service  Patient reminded to bring all medications and/or complete list.  Confirmed patient has transportation. Gave directions, instructed to utilize valet parking.
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
## 2024-08-24 ENCOUNTER — Inpatient Hospital Stay (HOSPITAL_COMMUNITY)

## 2024-09-09 ENCOUNTER — Telehealth (HOSPITAL_COMMUNITY): Payer: Self-pay

## 2024-09-09 NOTE — Telephone Encounter (Signed)
 Called to confirm/remind patient of their appointment at the [facility] on 09/10/24 11:30.   Appointment:   [] Confirmed  [] Left mess   [] No answer/No voice mail  [] VM Full/unable to leave message  [x] Phone not in service-Number is chart it not patients phone number.   Patient reminded to bring all medications and/or complete list.  Confirmed patient has transportation. Gave directions, instructed to utilize valet parking.
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
## 2024-09-10 ENCOUNTER — Ambulatory Visit (HOSPITAL_COMMUNITY): Payer: Self-pay | Admitting: Cardiology

## 2024-09-10 ENCOUNTER — Ambulatory Visit (HOSPITAL_COMMUNITY)
Admission: RE | Admit: 2024-09-10 | Discharge: 2024-09-10 | Disposition: A | Discharge status: Home / Self Care | Source: Ambulatory Visit | Attending: Cardiology | Admitting: Cardiology

## 2024-09-10 VITALS — BP 116/76 | HR 100 | Wt 191.2 lb

## 2024-09-10 DIAGNOSIS — Z21 Asymptomatic human immunodeficiency virus [HIV] infection status: Secondary | ICD-10-CM | POA: Insufficient documentation

## 2024-09-10 DIAGNOSIS — M79672 Pain in left foot: Secondary | ICD-10-CM | POA: Diagnosis not present

## 2024-09-10 DIAGNOSIS — J4489 Other specified chronic obstructive pulmonary disease: Secondary | ICD-10-CM | POA: Insufficient documentation

## 2024-09-10 DIAGNOSIS — Z91148 Patient's other noncompliance with medication regimen for other reason: Secondary | ICD-10-CM | POA: Insufficient documentation

## 2024-09-10 DIAGNOSIS — I513 Intracardiac thrombosis, not elsewhere classified: Secondary | ICD-10-CM

## 2024-09-10 DIAGNOSIS — Z7901 Long term (current) use of anticoagulants: Secondary | ICD-10-CM | POA: Diagnosis not present

## 2024-09-10 DIAGNOSIS — Z86718 Personal history of other venous thrombosis and embolism: Secondary | ICD-10-CM | POA: Diagnosis not present

## 2024-09-10 DIAGNOSIS — F419 Anxiety disorder, unspecified: Secondary | ICD-10-CM | POA: Insufficient documentation

## 2024-09-10 DIAGNOSIS — I110 Hypertensive heart disease with heart failure: Secondary | ICD-10-CM | POA: Diagnosis not present

## 2024-09-10 DIAGNOSIS — Z86711 Personal history of pulmonary embolism: Secondary | ICD-10-CM | POA: Diagnosis not present

## 2024-09-10 DIAGNOSIS — Z79899 Other long term (current) drug therapy: Secondary | ICD-10-CM | POA: Insufficient documentation

## 2024-09-10 DIAGNOSIS — F1910 Other psychoactive substance abuse, uncomplicated: Secondary | ICD-10-CM | POA: Insufficient documentation

## 2024-09-10 DIAGNOSIS — F22 Delusional disorders: Secondary | ICD-10-CM | POA: Insufficient documentation

## 2024-09-10 DIAGNOSIS — I5022 Chronic systolic (congestive) heart failure: Secondary | ICD-10-CM | POA: Diagnosis present

## 2024-09-10 DIAGNOSIS — I5020 Unspecified systolic (congestive) heart failure: Secondary | ICD-10-CM | POA: Diagnosis not present

## 2024-09-10 DIAGNOSIS — J449 Chronic obstructive pulmonary disease, unspecified: Secondary | ICD-10-CM

## 2024-09-10 DIAGNOSIS — F17210 Nicotine dependence, cigarettes, uncomplicated: Secondary | ICD-10-CM | POA: Diagnosis not present

## 2024-09-10 DIAGNOSIS — Z7984 Long term (current) use of oral hypoglycemic drugs: Secondary | ICD-10-CM | POA: Insufficient documentation

## 2024-09-10 DIAGNOSIS — S065XAA Traumatic subdural hemorrhage with loss of consciousness status unknown, initial encounter: Secondary | ICD-10-CM | POA: Diagnosis not present

## 2024-09-10 DIAGNOSIS — B20 Human immunodeficiency virus [HIV] disease: Secondary | ICD-10-CM | POA: Diagnosis not present

## 2024-09-10 DIAGNOSIS — I428 Other cardiomyopathies: Secondary | ICD-10-CM | POA: Diagnosis not present

## 2024-09-10 LAB — DIGOXIN LEVEL: Digoxin Level: <0.6 ng/mL — ABNORMAL LOW (ref 0.8–2.0)

## 2024-09-10 LAB — BASIC METABOLIC PANEL WITH GFR
Anion gap: 11 (ref 5–15)
BUN: 14 mg/dL (ref 8–23)
CO2: 29 mmol/L (ref 22–32)
Calcium: 9.2 mg/dL (ref 8.9–10.3)
Chloride: 98 mmol/L (ref 98–111)
Creatinine, Ser: 1.22 mg/dL (ref 0.61–1.24)
GFR, Estimated: >60 mL/min (ref >60)
Glucose, Bld: 101 mg/dL — ABNORMAL HIGH (ref 70–99)
Potassium: 3.3 mmol/L — ABNORMAL LOW (ref 3.5–5.1)
Sodium: 138 mmol/L (ref 135–145)

## 2024-09-10 LAB — BRAIN NATRIURETIC PEPTIDE: B Natriuretic Peptide: 216.4 pg/mL — ABNORMAL HIGH (ref 0.0–100.0)

## 2024-09-10 MED ORDER — TORSEMIDE 20 MG PO TABS: 20.0000 mg | ORAL_TABLET | ORAL | 3 refills | Status: DC | PRN

## 2024-09-10 NOTE — Patient Instructions (Signed)
 CHANGE Torsemide  to as needed for swelling.  Labs done today, your results will be available in MyChart, we will contact you for abnormal readings.  Your physician recommends that you schedule a follow-up appointment in: 3 weeks.  If you have any questions or concerns before your next appointment please send us  a message through [facility] or call our office at [phone].    TO LEAVE A MESSAGE FOR THE NURSE SELECT OPTION 2, PLEASE LEAVE A MESSAGE INCLUDING: YOUR NAME DATE OF BIRTH CALL BACK NUMBER REASON FOR CALL**this is important as we prioritize the call backs  YOU WILL RECEIVE A CALL BACK THE SAME DAY AS LONG AS YOU CALL BEFORE 4:00 PM  At the [facility], you and your health needs are our priority. As part of our continuing mission to provide you with exceptional heart care, we have created designated Provider Care Teams. These Care Teams include your primary Cardiologist (physician) and Advanced Practice Providers (APPs- Physician Assistants and Nurse Practitioners) who all work together to provide you with the care you need, when you need it.   You may see any of the following providers on your designated Care Team at your next follow up: Dr Toribio Fuel Dr Ezra Shuck Dr. Morene Brownie Greig Mosses, NP Caffie Shed, [place] [facility] [name], [place] Beckey Coe, NP [name], NP Ellouise Class, NP Tinnie Redman, PharmD Jaun Bash, PharmD   Please be sure to bring in all your medications bottles to every appointment.    Thank you for choosing [facility]

## 2024-09-13 ENCOUNTER — Telehealth (HOSPITAL_COMMUNITY): Payer: Self-pay | Admitting: *Deleted

## 2024-09-13 DIAGNOSIS — I5020 Unspecified systolic (congestive) heart failure: Secondary | ICD-10-CM

## 2024-09-13 DIAGNOSIS — M79672 Pain in left foot: Secondary | ICD-10-CM

## 2024-09-13 NOTE — Telephone Encounter (Signed)
 Called patient per [name], NP with following results and instructions:  Nondisplaced fracture. Please refer to ortho.  Pt verbalized understanding of same. Requested referral be sent to [facility]. Order sent. Gave patient contact information for same.

## 2024-09-29 ENCOUNTER — Other Ambulatory Visit (HOSPITAL_COMMUNITY): Payer: Self-pay | Admitting: Cardiology

## 2024-09-29 ENCOUNTER — Telehealth (HOSPITAL_COMMUNITY): Payer: Self-pay

## 2024-09-29 MED ORDER — DIGOXIN 125 MCG PO TABS: 0.1250 mg | ORAL_TABLET | Freq: Every day | ORAL | 6 refills | Status: DC

## 2024-09-29 MED ORDER — ATORVASTATIN CALCIUM 40 MG PO TABS: 40.0000 mg | ORAL_TABLET | Freq: Every day | ORAL | 6 refills | Status: DC

## 2024-09-29 MED ORDER — APIXABAN 5 MG PO TABS: 5.0000 mg | ORAL_TABLET | Freq: Two times a day (BID) | ORAL | 6 refills | Status: DC

## 2024-09-29 MED ORDER — SACUBITRIL-VALSARTAN 24-26 MG PO TABS: 1.0000 | ORAL_TABLET | Freq: Two times a day (BID) | ORAL | 6 refills | Status: DC

## 2024-09-29 MED ORDER — TORSEMIDE 20 MG PO TABS: 20.0000 mg | ORAL_TABLET | ORAL | 6 refills | Status: DC | PRN

## 2024-09-29 MED ORDER — DAPAGLIFLOZIN PROPANEDIOL 10 MG PO TABS: 10.0000 mg | ORAL_TABLET | Freq: Every day | ORAL | 6 refills | Status: DC

## 2024-09-29 MED ORDER — IVABRADINE HCL 5 MG PO TABS: 5.0000 mg | ORAL_TABLET | Freq: Two times a day (BID) | ORAL | 6 refills | Status: DC

## 2024-09-29 MED ORDER — SPIRONOLACTONE 25 MG PO TABS: 25.0000 mg | ORAL_TABLET | Freq: Every day | ORAL | 6 refills | Status: DC

## 2024-09-29 NOTE — Telephone Encounter (Signed)
 Called to confirm/remind patient of their appointment at the [facility] on 09/30/24. However patient rescheduled to a later Day.

## 2024-09-30 ENCOUNTER — Ambulatory Visit (HOSPITAL_COMMUNITY)

## 2024-09-30 ENCOUNTER — Telehealth (HOSPITAL_COMMUNITY): Payer: Self-pay

## 2024-09-30 NOTE — Telephone Encounter (Signed)
 Called to confirm/remind patient of their appointment at the [facility] on 11/22/23.   Appointment:   [x] Confirmed  [] Left mess   [] No answer/No voice mail  [] VM Full/unable to leave message  [] Phone not in service  Patient reminded to bring all medications and/or complete list.  Confirmed patient has transportation. Gave directions, instructed to utilize valet parking.

## 2024-10-01 ENCOUNTER — Ambulatory Visit (HOSPITAL_COMMUNITY)

## 2024-10-04 ENCOUNTER — Encounter: Admitting: Physician Assistant

## 2024-10-11 ENCOUNTER — Emergency Department (HOSPITAL_COMMUNITY)

## 2024-10-11 ENCOUNTER — Encounter (HOSPITAL_COMMUNITY): Payer: Self-pay

## 2024-10-11 ENCOUNTER — Other Ambulatory Visit (HOSPITAL_COMMUNITY): Payer: Self-pay | Admitting: Radiology

## 2024-10-11 ENCOUNTER — Inpatient Hospital Stay (HOSPITAL_COMMUNITY)
Admission: EM | Admit: 2024-10-11 | Discharge: 2024-10-13 | Disposition: A | Source: Home / Self Care | Attending: Internal Medicine | Admitting: Internal Medicine

## 2024-10-11 ENCOUNTER — Other Ambulatory Visit: Payer: Self-pay

## 2024-10-11 ENCOUNTER — Telehealth: Payer: Self-pay | Admitting: Student

## 2024-10-11 DIAGNOSIS — I110 Hypertensive heart disease with heart failure: Secondary | ICD-10-CM | POA: Diagnosis present

## 2024-10-11 DIAGNOSIS — W19XXXA Unspecified fall, initial encounter: Secondary | ICD-10-CM | POA: Diagnosis present

## 2024-10-11 DIAGNOSIS — R0789 Other chest pain: Secondary | ICD-10-CM | POA: Diagnosis present

## 2024-10-11 DIAGNOSIS — R4020 Unspecified coma: Principal | ICD-10-CM

## 2024-10-11 DIAGNOSIS — I5022 Chronic systolic (congestive) heart failure: Secondary | ICD-10-CM | POA: Diagnosis present

## 2024-10-11 DIAGNOSIS — Z79899 Other long term (current) drug therapy: Secondary | ICD-10-CM

## 2024-10-11 DIAGNOSIS — Z7151 Drug abuse counseling and surveillance of drug abuser: Secondary | ICD-10-CM

## 2024-10-11 DIAGNOSIS — S065XAS Traumatic subdural hemorrhage with loss of consciousness status unknown, sequela: Secondary | ICD-10-CM

## 2024-10-11 DIAGNOSIS — Z833 Family history of diabetes mellitus: Secondary | ICD-10-CM

## 2024-10-11 DIAGNOSIS — J4489 Other specified chronic obstructive pulmonary disease: Secondary | ICD-10-CM | POA: Diagnosis present

## 2024-10-11 DIAGNOSIS — Z21 Asymptomatic human immunodeficiency virus [HIV] infection status: Secondary | ICD-10-CM | POA: Diagnosis present

## 2024-10-11 DIAGNOSIS — Z1152 Encounter for screening for COVID-19: Secondary | ICD-10-CM

## 2024-10-11 DIAGNOSIS — F17210 Nicotine dependence, cigarettes, uncomplicated: Secondary | ICD-10-CM | POA: Diagnosis present

## 2024-10-11 DIAGNOSIS — Z823 Family history of stroke: Secondary | ICD-10-CM

## 2024-10-11 DIAGNOSIS — F1510 Other stimulant abuse, uncomplicated: Secondary | ICD-10-CM | POA: Diagnosis present

## 2024-10-11 DIAGNOSIS — M419 Scoliosis, unspecified: Secondary | ICD-10-CM | POA: Diagnosis present

## 2024-10-11 DIAGNOSIS — Z7901 Long term (current) use of anticoagulants: Secondary | ICD-10-CM

## 2024-10-11 DIAGNOSIS — Z8711 Personal history of peptic ulcer disease: Secondary | ICD-10-CM

## 2024-10-11 DIAGNOSIS — Z86711 Personal history of pulmonary embolism: Secondary | ICD-10-CM

## 2024-10-11 DIAGNOSIS — R561 Post traumatic seizures: Principal | ICD-10-CM | POA: Diagnosis present

## 2024-10-11 DIAGNOSIS — G8929 Other chronic pain: Secondary | ICD-10-CM | POA: Diagnosis present

## 2024-10-11 DIAGNOSIS — K219 Gastro-esophageal reflux disease without esophagitis: Secondary | ICD-10-CM | POA: Diagnosis present

## 2024-10-11 LAB — URINALYSIS, ROUTINE W REFLEX MICROSCOPIC
Bacteria, UA: NONE SEEN
Bilirubin Urine: NEGATIVE
Glucose, UA: >=500 mg/dL — AB
Hgb urine dipstick: NEGATIVE
Ketones, ur: NEGATIVE mg/dL
Leukocytes,Ua: NEGATIVE
Nitrite: NEGATIVE
Protein, ur: NEGATIVE mg/dL
Specific Gravity, Urine: 1.002 — ABNORMAL LOW (ref 1.005–1.030)
pH: 5 (ref 5.0–8.0)

## 2024-10-11 LAB — CBC WITH DIFFERENTIAL/PLATELET
Abs Immature Granulocytes: 0.03 K/uL (ref 0.00–0.07)
Basophils Absolute: 0.1 K/uL (ref 0.0–0.1)
Basophils Relative: 1 %
Eosinophils Absolute: 0.2 K/uL (ref 0.0–0.5)
Eosinophils Relative: 2 %
HCT: 39.6 % (ref 39.0–52.0)
Hemoglobin: 13.1 g/dL (ref 13.0–17.0)
Immature Granulocytes: 0 %
Lymphocytes Relative: 26 %
Lymphs Abs: 2 K/uL (ref 0.7–4.0)
MCH: 31 pg (ref 26.0–34.0)
MCHC: 33.1 g/dL (ref 30.0–36.0)
MCV: 93.8 fL (ref 80.0–100.0)
Monocytes Absolute: 0.6 K/uL (ref 0.1–1.0)
Monocytes Relative: 8 %
Neutro Abs: 4.8 K/uL (ref 1.7–7.7)
Neutrophils Relative %: 63 %
Platelets: 281 K/uL (ref 150–400)
RBC: 4.22 MIL/uL (ref 4.22–5.81)
RDW: 14.1 % (ref 11.5–15.5)
WBC: 7.6 K/uL (ref 4.0–10.5)
nRBC: 0 % (ref 0.0–0.2)

## 2024-10-11 LAB — COMPREHENSIVE METABOLIC PANEL WITH GFR
ALT: 8 U/L (ref 0–44)
AST: 31 U/L (ref 15–41)
Albumin: 4.6 g/dL (ref 3.5–5.0)
Alkaline Phosphatase: 107 U/L (ref 38–126)
Anion gap: 14 (ref 5–15)
BUN: 12 mg/dL (ref 8–23)
CO2: 23 mmol/L (ref 22–32)
Calcium: 9.3 mg/dL (ref 8.9–10.3)
Chloride: 100 mmol/L (ref 98–111)
Creatinine, Ser: 1.16 mg/dL (ref 0.61–1.24)
GFR, Estimated: >60 mL/min (ref >60)
Glucose, Bld: 116 mg/dL — ABNORMAL HIGH (ref 70–99)
Potassium: 3.9 mmol/L (ref 3.5–5.1)
Sodium: 136 mmol/L (ref 135–145)
Total Bilirubin: 0.6 mg/dL (ref 0.0–1.2)
Total Protein: 8.9 g/dL — ABNORMAL HIGH (ref 6.5–8.1)

## 2024-10-11 LAB — ETHANOL: Alcohol, Ethyl (B): <15 mg/dL (ref <15)

## 2024-10-11 LAB — RESP PANEL BY RT-PCR (RSV, FLU A&B, COVID)  RVPGX2
Influenza A by PCR: NEGATIVE
Influenza B by PCR: NEGATIVE
Resp Syncytial Virus by PCR: NEGATIVE
SARS Coronavirus 2 by RT PCR: NEGATIVE

## 2024-10-11 LAB — URINE DRUG SCREEN
Amphetamines: POSITIVE — AB
Barbiturates: NEGATIVE
Benzodiazepines: NEGATIVE
Cocaine: NEGATIVE
Fentanyl: NEGATIVE
Methadone Scn, Ur: NEGATIVE
Opiates: NEGATIVE
Tetrahydrocannabinol: NEGATIVE

## 2024-10-11 LAB — PRO BRAIN NATRIURETIC PEPTIDE: Pro Brain Natriuretic Peptide: 4237 pg/mL — ABNORMAL HIGH (ref <300.0)

## 2024-10-11 LAB — TROPONIN T, HIGH SENSITIVITY: Troponin T High Sensitivity: 29 ng/L — ABNORMAL HIGH (ref 0–19)

## 2024-10-11 LAB — MAGNESIUM: Magnesium: 2.3 mg/dL (ref 1.7–2.4)

## 2024-10-11 MED ORDER — HYDROMORPHONE HCL 1 MG/ML IJ SOLN
0.5000 mg | Freq: Once | INTRAMUSCULAR | Status: AC
  Administered 2024-10-11: 0.5 mg via INTRAVENOUS
  Filled 2024-10-11: qty 0.5

## 2024-10-11 MED ORDER — HYDROMORPHONE HCL 1 MG/ML IJ SOLN
0.5000 mg | Freq: Once | INTRAMUSCULAR | Status: AC
  Administered 2024-10-11: 23:00:00 0.5 mg via INTRAVENOUS
  Filled 2024-10-11: qty 0.5

## 2024-10-11 NOTE — ED Triage Notes (Addendum)
 Pt via [facility] from home c/o generalized pain all over. He states he has 6-7 seizures yesterday but did not seek medical help. States he's concerned of CHF and that his urine is real dark and smells. Pt appears jaundice. Pt is restless, denies being under the influence.

## 2024-10-11 NOTE — ED Notes (Signed)
 Went to lobby to pull pt back to do EKG and respiratory swab  Pt not in lobby , RN stated saw him step outside
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
## 2024-10-11 NOTE — ED Notes (Addendum)
 Pt will not cooperate for writer to do EKG, pt will not lay still. Dr.Scheving made aware, Pt getting dressed

## 2024-10-11 NOTE — ED Notes (Signed)
 Pt refuses to keep any monitoring equipment on. Dr Francesca aware.

## 2024-10-11 NOTE — ED Notes (Signed)
 Patient transported to CT

## 2024-10-11 NOTE — ED Notes (Signed)
 ED Provider at bedside. NT in the back aware EKG still needs to be done

## 2024-10-11 NOTE — ED Notes (Signed)
 Pt given a sandwich meal and a drink. States he will let us  complete an EKG once he has eaten.

## 2024-10-11 NOTE — Telephone Encounter (Signed)
 Patient called the after-hours answering service reporting that he has had 6-7 seizures in the past day.  Stated that he loses consciousness and does not know what happens with these episodes.  Reported that he has hit his head multiple times when these episodes happen.  Reported that he has bit his tongue as well and that he may be confused after the episodes happen.  Instructed the patient that he should call 911.  He asked me to call 911 for him.  I called 911 and gave them the patient's name address and phone number.  He may need an EEG to evaluate for seizure activity or may need to consider a ZIO monitor.  SignedMorse Clause, PA-C 10/11/2024, 5:49 PM

## 2024-10-12 ENCOUNTER — Emergency Department (HOSPITAL_COMMUNITY)

## 2024-10-12 DIAGNOSIS — Z823 Family history of stroke: Secondary | ICD-10-CM | POA: Diagnosis not present

## 2024-10-12 DIAGNOSIS — F17210 Nicotine dependence, cigarettes, uncomplicated: Secondary | ICD-10-CM | POA: Diagnosis present

## 2024-10-12 DIAGNOSIS — K219 Gastro-esophageal reflux disease without esophagitis: Secondary | ICD-10-CM | POA: Diagnosis present

## 2024-10-12 DIAGNOSIS — I110 Hypertensive heart disease with heart failure: Secondary | ICD-10-CM | POA: Diagnosis present

## 2024-10-12 DIAGNOSIS — S065XAS Traumatic subdural hemorrhage with loss of consciousness status unknown, sequela: Secondary | ICD-10-CM | POA: Diagnosis not present

## 2024-10-12 DIAGNOSIS — J4489 Other specified chronic obstructive pulmonary disease: Secondary | ICD-10-CM | POA: Diagnosis present

## 2024-10-12 DIAGNOSIS — Z86711 Personal history of pulmonary embolism: Secondary | ICD-10-CM | POA: Diagnosis not present

## 2024-10-12 DIAGNOSIS — I5022 Chronic systolic (congestive) heart failure: Secondary | ICD-10-CM | POA: Diagnosis present

## 2024-10-12 DIAGNOSIS — Z7151 Drug abuse counseling and surveillance of drug abuser: Secondary | ICD-10-CM | POA: Diagnosis not present

## 2024-10-12 DIAGNOSIS — Z833 Family history of diabetes mellitus: Secondary | ICD-10-CM | POA: Diagnosis not present

## 2024-10-12 DIAGNOSIS — R561 Post traumatic seizures: Secondary | ICD-10-CM | POA: Diagnosis present

## 2024-10-12 DIAGNOSIS — F1510 Other stimulant abuse, uncomplicated: Secondary | ICD-10-CM | POA: Diagnosis present

## 2024-10-12 DIAGNOSIS — Z21 Asymptomatic human immunodeficiency virus [HIV] infection status: Secondary | ICD-10-CM | POA: Diagnosis present

## 2024-10-12 DIAGNOSIS — M419 Scoliosis, unspecified: Secondary | ICD-10-CM | POA: Diagnosis present

## 2024-10-12 DIAGNOSIS — Z8711 Personal history of peptic ulcer disease: Secondary | ICD-10-CM | POA: Diagnosis not present

## 2024-10-12 DIAGNOSIS — Z1152 Encounter for screening for COVID-19: Secondary | ICD-10-CM | POA: Diagnosis not present

## 2024-10-12 DIAGNOSIS — Z79899 Other long term (current) drug therapy: Secondary | ICD-10-CM | POA: Diagnosis not present

## 2024-10-12 DIAGNOSIS — W19XXXA Unspecified fall, initial encounter: Secondary | ICD-10-CM | POA: Diagnosis present

## 2024-10-12 DIAGNOSIS — I5020 Unspecified systolic (congestive) heart failure: Secondary | ICD-10-CM | POA: Diagnosis not present

## 2024-10-12 DIAGNOSIS — R4020 Unspecified coma: Secondary | ICD-10-CM | POA: Diagnosis present

## 2024-10-12 DIAGNOSIS — G8929 Other chronic pain: Secondary | ICD-10-CM | POA: Diagnosis present

## 2024-10-12 DIAGNOSIS — R569 Unspecified convulsions: Secondary | ICD-10-CM | POA: Diagnosis not present

## 2024-10-12 DIAGNOSIS — R0789 Other chest pain: Secondary | ICD-10-CM | POA: Diagnosis present

## 2024-10-12 DIAGNOSIS — Z7901 Long term (current) use of anticoagulants: Secondary | ICD-10-CM | POA: Diagnosis not present

## 2024-10-12 LAB — COMPREHENSIVE METABOLIC PANEL WITH GFR
ALT: 8 U/L (ref 0–44)
AST: 32 U/L (ref 15–41)
Albumin: 4.5 g/dL (ref 3.5–5.0)
Alkaline Phosphatase: 91 U/L (ref 38–126)
Anion gap: 23 — ABNORMAL HIGH (ref 5–15)
BUN: 12 mg/dL (ref 8–23)
CO2: 17 mmol/L — ABNORMAL LOW (ref 22–32)
Calcium: 9.6 mg/dL (ref 8.9–10.3)
Chloride: 99 mmol/L (ref 98–111)
Creatinine, Ser: 1.26 mg/dL — ABNORMAL HIGH (ref 0.61–1.24)
GFR, Estimated: >60 mL/min (ref >60)
Glucose, Bld: 121 mg/dL — ABNORMAL HIGH (ref 70–99)
Potassium: 4.2 mmol/L (ref 3.5–5.1)
Sodium: 139 mmol/L (ref 135–145)
Total Bilirubin: 0.6 mg/dL (ref 0.0–1.2)
Total Protein: 8.4 g/dL — ABNORMAL HIGH (ref 6.5–8.1)

## 2024-10-12 LAB — DIGOXIN LEVEL: Digoxin Level: <0.6 ng/mL — ABNORMAL LOW (ref 0.8–2.0)

## 2024-10-12 LAB — TROPONIN T, HIGH SENSITIVITY: Troponin T High Sensitivity: 27 ng/L — ABNORMAL HIGH (ref 0–19)

## 2024-10-12 MED ORDER — OLANZAPINE 5 MG PO TABS
5.0000 mg | ORAL_TABLET | Freq: Every day | ORAL | Status: DC
  Administered 2024-10-12: 21:00:00 5 mg via ORAL
  Filled 2024-10-12: qty 1

## 2024-10-12 MED ORDER — LORAZEPAM 2 MG/ML IJ SOLN
1.0000 mg | Freq: Once | INTRAMUSCULAR | Status: AC
  Administered 2024-10-12: 01:00:00 1 mg via INTRAVENOUS
  Filled 2024-10-12: qty 1

## 2024-10-12 MED ORDER — SACUBITRIL-VALSARTAN 24-26 MG PO TABS
1.0000 | ORAL_TABLET | Freq: Two times a day (BID) | ORAL | Status: DC
  Administered 2024-10-12 – 2024-10-13 (×3): 1 via ORAL
  Filled 2024-10-12 (×3): qty 1

## 2024-10-12 MED ORDER — ONDANSETRON HCL 4 MG PO TABS: 4.0000 mg | ORAL_TABLET | Freq: Four times a day (QID) | ORAL | Status: DC | PRN

## 2024-10-12 MED ORDER — ACETAMINOPHEN 650 MG RE SUPP: 650.0000 mg | Freq: Four times a day (QID) | RECTAL | Status: DC | PRN

## 2024-10-12 MED ORDER — IBUPROFEN 400 MG PO TABS: 400.0000 mg | ORAL_TABLET | Freq: Four times a day (QID) | ORAL | Status: DC | PRN

## 2024-10-12 MED ORDER — ONDANSETRON HCL 4 MG/2ML IJ SOLN: 4.0000 mg | Freq: Four times a day (QID) | INTRAMUSCULAR | Status: DC | PRN

## 2024-10-12 MED ORDER — BICTEGRAVIR-EMTRICITAB-TENOFOV 50-200-25 MG PO TABS
1.0000 | ORAL_TABLET | Freq: Every day | ORAL | Status: DC
  Administered 2024-10-12 – 2024-10-13 (×2): 1 via ORAL
  Filled 2024-10-12 (×3): qty 1

## 2024-10-12 MED ORDER — PANTOPRAZOLE SODIUM 40 MG PO TBEC
40.0000 mg | DELAYED_RELEASE_TABLET | Freq: Every day | ORAL | Status: DC
  Administered 2024-10-12 – 2024-10-13 (×2): 40 mg via ORAL
  Filled 2024-10-12 (×2): qty 1

## 2024-10-12 MED ORDER — ACETAMINOPHEN 325 MG PO TABS: 650.0000 mg | ORAL_TABLET | Freq: Four times a day (QID) | ORAL | Status: DC | PRN

## 2024-10-12 MED ORDER — LORAZEPAM 2 MG/ML IJ SOLN
1.0000 mg | Freq: Once | INTRAMUSCULAR | Status: AC
  Administered 2024-10-12: 02:00:00 1 mg via INTRAVENOUS
  Filled 2024-10-12: qty 1

## 2024-10-12 MED ORDER — DULOXETINE HCL 60 MG PO CPEP
60.0000 mg | ORAL_CAPSULE | Freq: Every day | ORAL | Status: DC
  Administered 2024-10-12 – 2024-10-13 (×2): 60 mg via ORAL
  Filled 2024-10-12 (×2): qty 1

## 2024-10-12 MED ORDER — DAPAGLIFLOZIN PROPANEDIOL 10 MG PO TABS
10.0000 mg | ORAL_TABLET | Freq: Every day | ORAL | Status: DC
  Administered 2024-10-12 – 2024-10-13 (×2): 10 mg via ORAL
  Filled 2024-10-12 (×2): qty 1

## 2024-10-12 MED ORDER — SPIRONOLACTONE 25 MG PO TABS
25.0000 mg | ORAL_TABLET | Freq: Every day | ORAL | Status: DC
  Administered 2024-10-12 – 2024-10-13 (×2): 25 mg via ORAL
  Filled 2024-10-12 (×2): qty 1

## 2024-10-12 MED ORDER — LEVETIRACETAM (KEPPRA) 500 MG/5 ML ADULT IV PUSH
2000.0000 mg | Freq: Once | INTRAVENOUS | Status: AC
  Administered 2024-10-12: 03:00:00 2000 mg via INTRAVENOUS
  Filled 2024-10-12: qty 20

## 2024-10-12 MED ORDER — DIGOXIN 125 MCG PO TABS
0.1250 mg | ORAL_TABLET | Freq: Every day | ORAL | Status: DC
  Administered 2024-10-12 – 2024-10-13 (×2): 0.125 mg via ORAL
  Filled 2024-10-12 (×2): qty 1

## 2024-10-12 MED ORDER — HYDROCODONE-ACETAMINOPHEN 5-325 MG PO TABS
1.0000 | ORAL_TABLET | ORAL | Status: DC | PRN
  Administered 2024-10-12 – 2024-10-13 (×6): 2 via ORAL
  Filled 2024-10-12 (×6): qty 2

## 2024-10-12 MED ORDER — BUSPIRONE HCL 5 MG PO TABS
10.0000 mg | ORAL_TABLET | Freq: Three times a day (TID) | ORAL | Status: DC
  Administered 2024-10-12 – 2024-10-13 (×5): 10 mg via ORAL
  Filled 2024-10-12 (×5): qty 2

## 2024-10-12 MED ORDER — ENOXAPARIN SODIUM 40 MG/0.4ML IJ SOSY: 40.0000 mg | PREFILLED_SYRINGE | INTRAMUSCULAR | Status: DC

## 2024-10-12 MED ORDER — HYDRALAZINE HCL 20 MG/ML IJ SOLN: 10.0000 mg | INTRAMUSCULAR | Status: DC | PRN

## 2024-10-12 MED ORDER — POLYETHYLENE GLYCOL 3350 17 G PO PACK: 17.0000 g | PACK | Freq: Every day | ORAL | Status: DC | PRN

## 2024-10-12 MED ORDER — METHOCARBAMOL 1000 MG/10ML IJ SOLN: 500.0000 mg | Freq: Four times a day (QID) | INTRAMUSCULAR | Status: DC | PRN

## 2024-10-12 MED ORDER — ATORVASTATIN CALCIUM 40 MG PO TABS
40.0000 mg | ORAL_TABLET | Freq: Every day | ORAL | Status: DC
  Administered 2024-10-12 – 2024-10-13 (×2): 40 mg via ORAL
  Filled 2024-10-12 (×2): qty 1

## 2024-10-12 MED ORDER — IVABRADINE HCL 5 MG PO TABS
5.0000 mg | ORAL_TABLET | Freq: Two times a day (BID) | ORAL | Status: DC
  Administered 2024-10-12 – 2024-10-13 (×2): 5 mg via ORAL
  Filled 2024-10-12 (×4): qty 1

## 2024-10-12 MED ORDER — APIXABAN 5 MG PO TABS
5.0000 mg | ORAL_TABLET | Freq: Two times a day (BID) | ORAL | Status: DC
  Administered 2024-10-12 – 2024-10-13 (×2): 5 mg via ORAL
  Filled 2024-10-12 (×2): qty 1

## 2024-10-12 MED ORDER — ALBUTEROL SULFATE (2.5 MG/3ML) 0.083% IN NEBU: 2.5000 mg | INHALATION_SOLUTION | RESPIRATORY_TRACT | Status: DC | PRN

## 2024-10-12 MED ORDER — LEVETIRACETAM 500 MG PO TABS
500.0000 mg | ORAL_TABLET | Freq: Two times a day (BID) | ORAL | Status: DC
  Administered 2024-10-12 – 2024-10-13 (×3): 500 mg via ORAL
  Filled 2024-10-12 (×4): qty 1

## 2024-10-12 NOTE — Progress Notes (Signed)
 Mobility Specialist Progress Note:    10/12/24 1050  Mobility  Activity Ambulated with assistance  Level of Assistance Modified independent, requires aide device or extra time  Assistive Device None  Distance Ambulated (ft) 200 ft  Range of Motion/Exercises Active;All extremities  Activity Response Tolerated well  Mobility Referral Yes  Mobility visit 1 Mobility  Mobility Specialist Start Time (ACUTE ONLY) 1050  Mobility Specialist Stop Time (ACUTE ONLY) 1120  Mobility Specialist Time Calculation (min) (ACUTE ONLY) 30 min   Pt received standing up at bedside walking to bathroom, after bathroom pt got frustrated with NT and tried to leave walking around the halls. Security came up and helped transfer back to room. Pt finally agreed to get back in bed and calm down. Tolerated well, c/o RUQ pain. Nurse in room, all needs met.  Ronetta Molla Mobility Specialist Please contact via Special Educational Needs Teacher or  Rehab office at [phone]

## 2024-10-12 NOTE — H&P (Signed)
 History and Physical    Patient: Devin [name] FMW:[id number] DOB: [birth year] DOA: 10/11/2024 DOS: the patient was seen and examined on 10/12/2024 PCP: Patient, No Pcp Per  Patient coming from: Home  Chief Complaint:  Chief Complaint  Patient presents with   Multiple Complaints     HPI: Devin [name] is a 63 y.o. male with history of SDH, HFrEF, LV thrombus, PE, COPD, HIV, substance abuse including amphetamines, presenting with multiple syncopal events concern for seizures.  HPI obtained per reports as patient is very lethargic and unable to respond to questioning.  Per reports, patient has had multiple instances of losing consciousness, shaking all over, lasting 5 to 10 minutes.  Patient seemed to be be confused afterwards for around 30 minutes.  Patient had reportedly fell and hit his head.  No noted bleeding, shortness of breath, nausea, vomiting, chest pain, abdominal pain.  Patient has chronic back pain, and is currently on Eliquis .  Upon evaluation emergency department, urine drug screen positive for amphetamines proBNP 4257.  Neurology consulted with recommendations for loading dose of Keppra .   Review of Systems: As mentioned in the history of present illness. All other systems reviewed and are negative. Past Medical History:  Diagnosis Date   Anxiety    Arthritis    I'm eat up w/it (02/20/2017)   Asthma    Chronic back pain    the whole back (02/20/2017)   Chronic bronchitis (HCC)    Complication of anesthesia    felt like I couldn't breath coming out of it   COPD (chronic obstructive pulmonary disease) (HCC)    Depression    GERD (gastroesophageal reflux disease)    History of hiatal hernia    History of stomach ulcers    bleeding ones; I was young then   HIV infection (HCC) dx'd ~ 1999   Hypertension    Pneumonia    several times (02/20/2017)   Prolapsed disk 10/28   Pulmonary embolism (HCC) 02/20/2017   Scoliosis 08/24/13   Past Surgical History:   Procedure Laterality Date   BACK SURGERY  2019   KNEE ARTHROSCOPY Right 1980s   RIGHT HEART CATH N/A 07/28/2024   Procedure: RIGHT HEART CATH;  Surgeon: Cherrie Toribio SAUNDERS, MD;  Location: [facility] INVASIVE CV LAB;  Service: Cardiovascular;  Laterality: N/A;   RIGHT/LEFT HEART CATH AND CORONARY ANGIOGRAPHY N/A 03/08/2024   Procedure: RIGHT/LEFT HEART CATH AND CORONARY ANGIOGRAPHY;  Surgeon: Rolan Ezra RAMAN, MD;  Location: [facility] INVASIVE CV LAB;  Service: Cardiovascular;  Laterality: N/A;   Social History:  reports that he has been smoking cigarettes. He has a 48 pack-year smoking history. He has never used smokeless tobacco. He reports current alcohol use. He reports current drug use. Drugs: Methamphetamines and Marijuana.  Allergies[1]  Family History  Problem Relation Age of Onset   Diabetes Father    Stroke Other    Colon cancer Neg Hx     Prior to Admission medications  Medication Sig Start Date End Date Taking? Authorizing Provider  acetaminophen  (TYLENOL ) 500 MG tablet Take 1 tablet (500 mg total) by mouth every 6 (six) hours. 08/03/24   Arrien, Elidia Toribio, MD  ALPRAZolam  (XANAX ) 1 MG tablet Take 1 tablet (1 mg total) by mouth 3 (three) times daily as needed for anxiety. 08/13/24   Arrien, Elidia Toribio, MD  apixaban  (ELIQUIS ) 5 MG TABS tablet Take 1 tablet (5 mg total) by mouth 2 (two) times daily. 09/29/24   Rolan Ezra RAMAN, MD  atorvastatin  (  LIPITOR ) 40 MG tablet Take 1 tablet (40 mg total) by mouth daily. 09/29/24   Rolan Ezra RAMAN, MD  bictegravir-emtricitabine -tenofovir  AF (BIKTARVY ) 50-200-25 MG TABS tablet Take 1 tablet by mouth daily.    [provider]  busPIRone  (BUSPAR ) 10 MG tablet Take 1 tablet (10 mg total) by mouth 3 (three) times daily. 08/03/24   Arrien, Elidia Sieving, MD  dapagliflozin  propanediol (FARXIGA ) 10 MG TABS tablet Take 1 tablet (10 mg total) by mouth daily. 09/29/24   Rolan Ezra RAMAN, MD  digoxin  (LANOXIN ) 0.125 MG tablet Take 1 tablet (0.125 mg  total) by mouth daily. 09/29/24   Rolan Ezra RAMAN, MD  DULoxetine  (CYMBALTA ) 60 MG capsule Take 1 capsule (60 mg total) by mouth daily. 08/03/24   Arrien, Elidia Sieving, MD  ivabradine  (CORLANOR ) 5 MG TABS tablet Take 1 tablet (5 mg total) by mouth 2 (two) times daily with a meal. 09/29/24   Rolan Ezra RAMAN, MD  OLANZapine  (ZYPREXA ) 5 MG tablet Take 1 tablet (5 mg total) by mouth at bedtime. 08/03/24   Arrien, Elidia Sieving, MD  pantoprazole  (PROTONIX ) 40 MG tablet Take 1 tablet (40 mg total) by mouth daily. 03/11/24   Arrien, Elidia Sieving, MD  sacubitril -valsartan  (ENTRESTO ) 24-26 MG Take 1 tablet by mouth 2 (two) times daily. 09/29/24   Rolan Ezra RAMAN, MD  spironolactone  (ALDACTONE ) 25 MG tablet Take 1 tablet (25 mg total) by mouth daily. 09/29/24   Rolan Ezra RAMAN, MD  torsemide  (DEMADEX ) 20 MG tablet Take 1 tablet (20 mg total) by mouth as needed. For swelling. 09/29/24   Rolan Ezra RAMAN, MD    Physical Exam:  Vitals:   10/12/24 0300 10/12/24 0400 10/12/24 0500 10/12/24 0614  BP: 127/79 117/88 (!) 156/83 98/80  Pulse: 97   (!) 49  Resp: 16   19  Temp: 98.1 F (36.7 C)   99.3 F (37.4 C)  TempSrc:    Axillary  SpO2: 99%   94%  Weight:      Height:        GENERAL: Lethargic but arousable HEENT:  EOMI CARDIOVASCULAR:  RRR, no murmurs appreciated RESPIRATORY:  Clear to auscultation, no wheezing, rales, or rhonchi GASTROINTESTINAL:  Soft, nontender, nondistended EXTREMITIES: No edema NEURO: Moves all 4 extremities SKIN: Scattered ecchymosis in various stages of healing PSYCH: Postictal/lethargic   Data Reviewed:  Results are pending, will review when available.  CT Cervical Spine Wo Contrast Result Date: 10/12/2024 EXAM: CT CERVICAL SPINE WITHOUT CONTRAST 10/12/2024 02:27:53 AM TECHNIQUE: CT of the cervical spine was performed without the administration of intravenous contrast. Multiplanar reformatted images are provided for review. Automated exposure control, iterative  reconstruction, and/or weight based adjustment of the mA/kV was utilized to reduce the radiation dose to as low as reasonably achievable. COMPARISON: None available. CLINICAL HISTORY: Altered mental status and neck pain, initial encounter. FINDINGS: BONES AND ALIGNMENT: Loss of the normal cervical lordosis is noted which may be related to muscular spasm. 7 cervical segments are well visualized. No acute fracture or traumatic malalignment. No acute facet abnormality is noted. DEGENERATIVE CHANGES: Mild osteophytic changes and facet hypertrophic changes are seen. SOFT TISSUES: The surrounding soft tissue structures are unremarkable. LUNG APICES: The visualized lung apices are within normal limits. IMPRESSION: 1. No acute fracture or acute facet abnormality. 2. Loss of the normal cervical lordosis, possibly related to muscular spasm. 3. Mild osteophytic changes and facet hypertrophic changes. Electronically signed by: Oneil Devonshire MD 10/12/2024 02:39 AM EST RP Workstation: [id number]   CT  Head Wo Contrast Result Date: 10/12/2024 EXAM: CT HEAD WITHOUT 10/12/2024 02:27:53 AM TECHNIQUE: CT of the head was performed without the administration of intravenous contrast. Automated exposure control, iterative reconstruction, and/or weight based adjustment of the mA/kV was utilized to reduce the radiation dose to as low as reasonably achievable. COMPARISON: None available. CLINICAL HISTORY: Altered mental status. Head trauma, coagulopathy (Age 77-64y). FINDINGS: LIMITATIONS/ARTIFACTS: The study is significantly limited by patient motion artifact. BRAIN AND VENTRICLES: No findings to suggest acute hemorrhage, acute infarction, or space-occupying mass lesion are noted. No extra-axial fluid collection. ORBITS: No acute abnormality. SINUSES AND MASTOIDS: No acute abnormality. SOFT TISSUES AND SKULL: The bony structures show no acute abnormality although again limited. No acute soft tissue abnormality. IMPRESSION: 1. No acute  intracranial abnormality. 2. Study significantly limited by patient motion artifact. Electronically signed by: Oneil Devonshire MD 10/12/2024 02:36 AM EST RP Workstation: [id number]   DG Chest Portable 1 View Result Date: 10/11/2024 EXAM: 1 VIEW(S) XRAY OF THE CHEST 10/11/2024 09:05:00 PM COMPARISON: CT chest dated 08/08/2024. CLINICAL HISTORY: chest pain FINDINGS: LUNGS AND PLEURA: Scarring in the lateral right lung apex. Mild lingular scarring/atelectasis. Low lung volumes. No pleural effusion. No pneumothorax. HEART AND MEDIASTINUM: Heart is at the upper limits of normal in size for inspiration. No acute abnormality of the mediastinal silhouette. BONES AND SOFT TISSUES: No acute osseous abnormality. IMPRESSION: 1. No acute cardiopulmonary process. Electronically signed by: Pinkie Pebbles MD 10/11/2024 09:16 PM EST RP Workstation: [id number]    Assessment and Plan:  Concern for seizures - Given shaking and postictal symptoms, along with history of SDH and methamphetamine use, concern for seizure disorder.  Per recommendations from neurology, initiated on IV Keppra  loading dose followed by 500 mg twice daily.  Patient does not appear to be taking any p.o. meds at this time, will transition to IV for now.  Will order EEG.  Neurology to follow-up.  Chronic HFrEF - Does not appear to be in acute exacerbation, no hypoxia or edema.  Will resume home GDMT.  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  COPD - Does not appear to be in acute exacerbation.  Nebulizers as needed.  History of PE/LV thrombus - Continue Eliquis .  CT head showing no new SDH.  Advance Care Planning:   Code Status: Full Code   Consults: Neurology  Family Communication: None at bedside  Severity of Illness: The appropriate patient status for this patient is OBSERVATION. Observation status is judged to be reasonable and necessary in order to provide the required intensity of service to ensure the patient's  safety. The patient's presenting symptoms, physical exam findings, and initial radiographic and laboratory data in the context of their medical condition is felt to place them at decreased risk for further clinical deterioration. Furthermore, it is anticipated that the patient will be medically stable for discharge from the hospital within 2 midnights of admission.   Author: Carliss LELON Canales, DO 10/12/2024 10:33 AM  For on call review [email or web address].     [1] No Known Allergies

## 2024-10-12 NOTE — Plan of Care (Signed)

## 2024-10-12 NOTE — ED Notes (Signed)
 Patient transported to CT

## 2024-10-12 NOTE — ED Provider Notes (Signed)
 Patient signed out pending CT imaging.  It took a significant amount of medication and bedside support to get CT imaging.  Ultimately was able to obtain imaging without any evidence of obvious acute traumatic injury.  Discussed with neurology.  Given prior MRI findings of subdural, would be reasonable to start Keppra  for seizure prophylaxis.  Recommends admission for observation and routine EEG in the morning.  Physical Exam  BP 93/63   Pulse 88   Temp 97.9 F (36.6 C) (Oral)   Resp 16   Ht 1.905 m (6' 3)   Wt 86.2 kg   SpO2 96%   BMI 23.75 kg/m   Physical Exam Disheveled Chronically ill-appearing, no acute distress Anxious at times  Procedures  Procedures  ED Course / MDM   Clinical Course as of 10/12/24 0257  Mon Oct 11, 2024  2329 Delay in obtaining CT scan due to patient not wanting to go until he has eaten and then once getting to CT scan he said his back hurt too much so they brought him back.  Will give medicine for back pain.  Discussed with neurology Dr. Sharri, would like to see neuroimaging before making any decisions regarding antiepileptic.  Signed out to oncoming provider Dr. Bari pending reattempting CT scan and discussion with neurology, likely admission. [WS]  Tue Oct 12, 2024  0222 Accompanied patient to the CT scanner.  Continues to complain of some back discomfort.  Is able to lay on his side and with coaching was able to obtain CT imaging. [CH]  0255 Spoke with Dr. Vanessa, neurology.  Recommends discontinuing methamphetamine use.  Would recommend loading with 2 g of Keppra  and start Keppra  500 mg twice daily.  Routine EEG given for prior MRI findings. [CH]    Clinical Course User Index [CH] Skylarr Liz, Charmaine FALCON, MD [WS] Francesca Elsie CROME, MD   Medical Decision Making Amount and/or Complexity of Data Reviewed Labs: ordered. Radiology: ordered.  Risk Prescription drug management. Decision regarding hospitalization.   Problem List Items  Addressed This Visit   None Visit Diagnoses       Loss of consciousness (HCC)    -  Primary     Observed seizure-like activity (HCC)                 Margene Cherian, Charmaine FALCON, MD 10/12/24 [phone]

## 2024-10-12 NOTE — Progress Notes (Signed)
°  Transition of Care (TOC) Screening Note   Patient Details  Name: Devin [name] Date of Birth: [birth year]   Transition of Care [facility] CM/SW Contact:    Hoy DELENA Bigness, LCSW Phone Number: 10/12/2024, 10:07 AM    Transition of Care Department [facility]) has reviewed patient and no TOC needs have been identified at this time. We will continue to monitor patient advancement through interdisciplinary progression rounds. If new patient transition needs arise, please place a TOC consult.    10/12/24 1007  TOC Brief Assessment  Insurance and Status Reviewed  Patient has primary care physician Yes  Home environment has been reviewed From home  Prior level of function: Independent  Prior/Current Home Services No current home services  Social Drivers of Health Review SDOH reviewed no interventions necessary  Readmission risk has been reviewed Yes  Transition of care needs no transition of care needs at this time

## 2024-10-13 ENCOUNTER — Inpatient Hospital Stay (HOSPITAL_COMMUNITY)

## 2024-10-13 DIAGNOSIS — I5022 Chronic systolic (congestive) heart failure: Secondary | ICD-10-CM

## 2024-10-13 LAB — COMPREHENSIVE METABOLIC PANEL WITH GFR
ALT: 6 U/L (ref 0–44)
AST: 18 U/L (ref 15–41)
Albumin: 3.8 g/dL (ref 3.5–5.0)
Alkaline Phosphatase: 76 U/L (ref 38–126)
Anion gap: 12 (ref 5–15)
BUN: 15 mg/dL (ref 8–23)
CO2: 24 mmol/L (ref 22–32)
Calcium: 8.6 mg/dL — ABNORMAL LOW (ref 8.9–10.3)
Chloride: 102 mmol/L (ref 98–111)
Creatinine, Ser: 1.63 mg/dL — ABNORMAL HIGH (ref 0.61–1.24)
GFR, Estimated: 48 mL/min — ABNORMAL LOW (ref >60)
Glucose, Bld: 130 mg/dL — ABNORMAL HIGH (ref 70–99)
Potassium: 3.9 mmol/L (ref 3.5–5.1)
Sodium: 137 mmol/L (ref 135–145)
Total Bilirubin: 0.6 mg/dL (ref 0.0–1.2)
Total Protein: 6.9 g/dL (ref 6.5–8.1)

## 2024-10-13 LAB — CBC
HCT: 35 % — ABNORMAL LOW (ref 39.0–52.0)
Hemoglobin: 11.5 g/dL — ABNORMAL LOW (ref 13.0–17.0)
MCH: 31.1 pg (ref 26.0–34.0)
MCHC: 32.9 g/dL (ref 30.0–36.0)
MCV: 94.6 fL (ref 80.0–100.0)
Platelets: 234 K/uL (ref 150–400)
RBC: 3.7 MIL/uL — ABNORMAL LOW (ref 4.22–5.81)
RDW: 14 % (ref 11.5–15.5)
WBC: 5.6 K/uL (ref 4.0–10.5)
nRBC: 0 % (ref 0.0–0.2)

## 2024-10-13 LAB — PHOSPHORUS: Phosphorus: 5 mg/dL — ABNORMAL HIGH (ref 2.5–4.6)

## 2024-10-13 LAB — MAGNESIUM: Magnesium: 2.3 mg/dL (ref 1.7–2.4)

## 2024-10-13 MED ORDER — TORSEMIDE 20 MG PO TABS
20.0000 mg | ORAL_TABLET | Freq: Every day | ORAL | Status: DC
  Administered 2024-10-13: 08:00:00 20 mg via ORAL
  Filled 2024-10-13: qty 1

## 2024-10-13 MED ORDER — ALPRAZOLAM 1 MG PO TABS
1.0000 mg | ORAL_TABLET | Freq: Three times a day (TID) | ORAL | Status: DC | PRN
  Administered 2024-10-13: 09:00:00 1 mg via ORAL
  Filled 2024-10-13: qty 1

## 2024-10-13 MED ORDER — LEVETIRACETAM 500 MG PO TABS: 500.0000 mg | ORAL_TABLET | Freq: Two times a day (BID) | ORAL | 0 refills | Status: AC

## 2024-10-13 NOTE — Plan of Care (Signed)

## 2024-10-13 NOTE — Care Management Important Message (Signed)
 Important Message  Patient Details  Name: Keyandre Pileggi Vicencio MRN: [id number] Date of Birth: [birth year]   Important Message Given:  N/A - LOS <3 / Initial given by admissions     Duwaine LITTIE Ada 10/13/2024, 11:32 AM

## 2024-10-13 NOTE — Discharge Summary (Signed)
 Physician Discharge Summary   Patient: Devin [name] MRN: [id number] DOB: 03/20/1961  Admit date:     10/11/2024  Discharge date: 10/13/2024  Discharge Physician: Carliss LELON Canales   PCP: Patient, No Pcp Per   Recommendations at discharge:    Pt to be discharged home.   If you experience worsening fever, chills, chest pain, shortness of breath, or other concerning symptoms, please call your PCP or go to the emergency department immediately.  Discharge Diagnoses: Principal Problem:   Witnessed seizure-like activity (HCC) Active Problems:   Seizure (HCC)  Resolved Problems:   * No resolved hospital problems. *   Hospital Course:  63 y.o. male with history of SDH, HFrEF, LV thrombus, PE, COPD, HIV, substance abuse including amphetamines, presenting with multiple syncopal events concern for seizures.  HPI obtained per reports as patient is very lethargic and unable to respond to questioning.  Per reports, patient has had multiple instances of losing consciousness, shaking all over, lasting 5 to 10 minutes.  Patient seemed to be be confused afterwards for around 30 minutes.  Patient had reportedly fell and hit his head.  No noted bleeding, shortness of breath, nausea, vomiting, chest pain, abdominal pain.  Patient has chronic back pain, and is currently on Eliquis .   Upon evaluation emergency department, urine drug screen positive for amphetamines proBNP 4257.  Neurology consulted with recommendations for loading dose of Keppra .    Assessment and Plan:   Concern for seizures - Given shaking and postictal symptoms, along with history of SDH and methamphetamine use, concern for seizure disorder.  Per recommendations from neurology, initiated on IV Keppra  loading dose followed by 500 mg twice daily.  Patient no longer postictal, ambulatory, tolerating diet, eager for discharge home.  Will give prescription for p.o. Keppra  500 mg twice daily to take upon discharge.  Patient to follow-up with  neurology in the outpatient setting.  Chronic HFrEF - Does not appear to be in acute exacerbation, no hypoxia or edema.  Will resume home GDMT medication regiment.   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   COPD - Does not appear to be in acute exacerbation.  Resume home nebulizer/inhaler regimen.   History of PE/LV thrombus - Continue Eliquis .  CT head showing no new SDH.  Intermittent cramping right sided chest pain - Appears to be somewhat chronic in nature.  Chest x-ray ordered showing no acute fracture or dislocation.  Patient stating he has a history of severe scoliosis as well as dislocated ribs in the past.  CT ordered.  Encourage patient to follow-up with primary care physician.   Consultants: Neurology Procedures performed: None Disposition: Home Diet recommendation:  Discharge Diet Orders (From admission, onward)     Start     Ordered   10/13/24 0000  Diet - low sodium heart healthy        10/13/24 1029           Cardiac diet  DISCHARGE MEDICATION: Allergies as of 10/13/2024   No Known Allergies      Medication List     TAKE these medications    acetaminophen  500 MG tablet Commonly known as: TYLENOL  Take 1 tablet (500 mg total) by mouth every 6 (six) hours.   ALPRAZolam  1 MG tablet Commonly known as: XANAX  Take 1 tablet (1 mg total) by mouth 3 (three) times daily as needed for anxiety.   apixaban  5 MG Tabs tablet Commonly known as: Eliquis  Take 1 tablet (5 mg total) by mouth 2 (  two) times daily.   atorvastatin  40 MG tablet Commonly known as: LIPITOR  Take 1 tablet (40 mg total) by mouth daily.   Biktarvy  50-200-25 MG Tabs tablet Generic drug: bictegravir-emtricitabine -tenofovir  AF Take 1 tablet by mouth daily.   busPIRone  10 MG tablet Commonly known as: BUSPAR  Take 1 tablet (10 mg total) by mouth 3 (three) times daily.   dapagliflozin  propanediol 10 MG Tabs tablet Commonly known as: FARXIGA  Take 1 tablet (10 mg total)  by mouth daily.   digoxin  0.125 MG tablet Commonly known as: LANOXIN  Take 1 tablet (0.125 mg total) by mouth daily.   DULoxetine  60 MG capsule Commonly known as: CYMBALTA  Take 1 capsule (60 mg total) by mouth daily.   ivabradine  5 MG Tabs tablet Commonly known as: CORLANOR  Take 1 tablet (5 mg total) by mouth 2 (two) times daily with a meal.   levETIRAcetam  500 MG tablet Commonly known as: KEPPRA  Take 1 tablet (500 mg total) by mouth 2 (two) times daily.   OLANZapine  5 MG tablet Commonly known as: ZYPREXA  Take 1 tablet (5 mg total) by mouth at bedtime.   pantoprazole  40 MG tablet Commonly known as: PROTONIX  Take 1 tablet (40 mg total) by mouth daily.   sacubitril -valsartan  24-26 MG Commonly known as: ENTRESTO  Take 1 tablet by mouth 2 (two) times daily.   spironolactone  25 MG tablet Commonly known as: ALDACTONE  Take 1 tablet (25 mg total) by mouth daily.   torsemide  20 MG tablet Commonly known as: DEMADEX  Take 1 tablet (20 mg total) by mouth as needed. For swelling.         Discharge Exam: Filed Weights   10/11/24 1904  Weight: 86.2 kg    GENERAL:  Alert, pleasant, no acute distress, disheveled HEENT:  EOMI CARDIOVASCULAR:  RRR, no murmurs appreciated RESPIRATORY:  Clear to auscultation, no wheezing, rales, or rhonchi GASTROINTESTINAL:  Soft, nontender, nondistended EXTREMITIES:  No LE edema bilaterally NEURO:  No new focal deficits appreciated SKIN:  No rashes noted PSYCH:  Appropriate mood and affect     Condition at discharge: improving  The results of significant diagnostics from this hospitalization (including imaging, microbiology, ancillary and laboratory) are listed below for reference.   Imaging Studies: CT Cervical Spine Wo Contrast Result Date: 10/12/2024 EXAM: CT CERVICAL SPINE WITHOUT CONTRAST 10/12/2024 02:27:53 AM TECHNIQUE: CT of the cervical spine was performed without the administration of intravenous contrast. Multiplanar  reformatted images are provided for review. Automated exposure control, iterative reconstruction, and/or weight based adjustment of the mA/kV was utilized to reduce the radiation dose to as low as reasonably achievable. COMPARISON: None available. CLINICAL HISTORY: Altered mental status and neck pain, initial encounter. FINDINGS: BONES AND ALIGNMENT: Loss of the normal cervical lordosis is noted which may be related to muscular spasm. 7 cervical segments are well visualized. No acute fracture or traumatic malalignment. No acute facet abnormality is noted. DEGENERATIVE CHANGES: Mild osteophytic changes and facet hypertrophic changes are seen. SOFT TISSUES: The surrounding soft tissue structures are unremarkable. LUNG APICES: The visualized lung apices are within normal limits. IMPRESSION: 1. No acute fracture or acute facet abnormality. 2. Loss of the normal cervical lordosis, possibly related to muscular spasm. 3. Mild osteophytic changes and facet hypertrophic changes. Electronically signed by: Oneil Devonshire MD 10/12/2024 02:39 AM EST RP Workstation: [id number]   CT Head Wo Contrast Result Date: 10/12/2024 EXAM: CT HEAD WITHOUT 10/12/2024 02:27:53 AM TECHNIQUE: CT of the head was performed without the administration of intravenous contrast. Automated exposure control, iterative reconstruction, and/or weight  based adjustment of the mA/kV was utilized to reduce the radiation dose to as low as reasonably achievable. COMPARISON: None available. CLINICAL HISTORY: Altered mental status. Head trauma, coagulopathy (Age 75-64y). FINDINGS: LIMITATIONS/ARTIFACTS: The study is significantly limited by patient motion artifact. BRAIN AND VENTRICLES: No findings to suggest acute hemorrhage, acute infarction, or space-occupying mass lesion are noted. No extra-axial fluid collection. ORBITS: No acute abnormality. SINUSES AND MASTOIDS: No acute abnormality. SOFT TISSUES AND SKULL: The bony structures show no acute abnormality  although again limited. No acute soft tissue abnormality. IMPRESSION: 1. No acute intracranial abnormality. 2. Study significantly limited by patient motion artifact. Electronically signed by: Oneil Devonshire MD 10/12/2024 02:36 AM EST RP Workstation: [id number]   DG Chest Portable 1 View Result Date: 10/11/2024 EXAM: 1 VIEW(S) XRAY OF THE CHEST 10/11/2024 09:05:00 PM COMPARISON: CT chest dated 08/08/2024. CLINICAL HISTORY: chest pain FINDINGS: LUNGS AND PLEURA: Scarring in the lateral right lung apex. Mild lingular scarring/atelectasis. Low lung volumes. No pleural effusion. No pneumothorax. HEART AND MEDIASTINUM: Heart is at the upper limits of normal in size for inspiration. No acute abnormality of the mediastinal silhouette. BONES AND SOFT TISSUES: No acute osseous abnormality. IMPRESSION: 1. No acute cardiopulmonary process. Electronically signed by: Pinkie Pebbles MD 10/11/2024 09:16 PM EST RP Workstation: [id number]    Microbiology: Results for orders placed or performed during the hospital encounter of 10/11/24  Resp panel by RT-PCR (RSV, Flu A&B, Covid) Anterior Nasal Swab     Status: None   Collection Time: 10/11/24  8:34 PM   Specimen: Anterior Nasal Swab  Result Value Ref Range Status   SARS Coronavirus 2 by RT PCR NEGATIVE NEGATIVE Final    Comment: (NOTE) SARS-CoV-2 target nucleic acids are NOT DETECTED.  The SARS-CoV-2 RNA is generally detectable in upper respiratory specimens during the acute phase of infection. The lowest concentration of SARS-CoV-2 viral copies this assay can detect is 138 copies/mL. A negative result does not preclude SARS-Cov-2 infection and should not be used as the sole basis for treatment or other patient management decisions. A negative result may occur with  improper specimen collection/handling, submission of specimen other than nasopharyngeal swab, presence of viral mutation(s) within the areas targeted by this assay, and inadequate number of  viral copies(<138 copies/mL). A negative result must be combined with clinical observations, patient history, and epidemiological information. The expected result is Negative.  Fact Sheet for Patients:  bloggercourse.com  Fact Sheet for Healthcare Providers:  seriousbroker.it  This test is no t yet approved or cleared by the [place]  FDA and  has been authorized for detection and/or diagnosis of SARS-CoV-2 by FDA under an Emergency Use Authorization (EUA). This EUA will remain  in effect (meaning this test can be used) for the duration of the COVID-19 declaration under Section 564(b)(1) of the Act, 21 U.S.C.section 360bbb-3(b)(1), unless the authorization is terminated  or revoked sooner.       Influenza A by PCR NEGATIVE NEGATIVE Final   Influenza B by PCR NEGATIVE NEGATIVE Final    Comment: (NOTE) The Xpert Xpress SARS-CoV-2/FLU/RSV plus assay is intended as an aid in the diagnosis of influenza from Nasopharyngeal swab specimens and should not be used as a sole basis for treatment. Nasal washings and aspirates are unacceptable for Xpert Xpress SARS-CoV-2/FLU/RSV testing.  Fact Sheet for Patients: bloggercourse.com  Fact Sheet for Healthcare Providers: seriousbroker.it  This test is not yet approved or cleared by the [place]  FDA and has been authorized for detection and/or diagnosis of SARS-CoV-2  by FDA under an Emergency Use Authorization (EUA). This EUA will remain in effect (meaning this test can be used) for the duration of the COVID-19 declaration under Section 564(b)(1) of the Act, 21 U.S.C. section 360bbb-3(b)(1), unless the authorization is terminated or revoked.     Resp Syncytial Virus by PCR NEGATIVE NEGATIVE Final    Comment: (NOTE) Fact Sheet for Patients: bloggercourse.com  Fact Sheet for Healthcare  Providers: seriousbroker.it  This test is not yet approved or cleared by the [place]  FDA and has been authorized for detection and/or diagnosis of SARS-CoV-2 by FDA under an Emergency Use Authorization (EUA). This EUA will remain in effect (meaning this test can be used) for the duration of the COVID-19 declaration under Section 564(b)(1) of the Act, 21 U.S.C. section 360bbb-3(b)(1), unless the authorization is terminated or revoked.  Performed at [facility], [address] [place], [place] [address]     Labs: CBC: Recent Labs  Lab 10/11/24 2014 10/13/24 0425  WBC 7.6 5.6  NEUTROABS 4.8  --   HGB 13.1 11.5*  HCT 39.6 35.0*  MCV 93.8 94.6  PLT 281 234   Basic Metabolic Panel: Recent Labs  Lab 10/11/24 2014 10/12/24 0100 10/13/24 0425  NA 136 139 137  K 3.9 4.2 3.9  CL 100 99 102  CO2 23 17* 24  GLUCOSE 116* 121* 130*  BUN 12 12 15   CREATININE 1.16 1.26* 1.63*  CALCIUM  9.3 9.6 8.6*  MG 2.3  --  2.3  PHOS  --   --  5.0*   Liver Function Tests: Recent Labs  Lab 10/11/24 2014 10/12/24 0100 10/13/24 0425  AST 31 32 18  ALT 8 8 6   ALKPHOS 107 91 76  BILITOT 0.6 0.6 0.6  PROT 8.9* 8.4* 6.9  ALBUMIN 4.6 4.5 3.8   CBG: No results for input(s): GLUCAP in the last 168 hours.  Discharge time spent: 32 minutes.  Length of inpatient stay: 1 days  Signed: Carliss LELON Canales, DO [facility] 10/13/2024

## 2024-10-13 NOTE — Plan of Care (Signed)
  Problem: Nutrition: Goal: Adequate nutrition will be maintained Outcome: Progressing   Problem: Coping: Goal: Level of anxiety will decrease Outcome: Progressing   Problem: Pain Managment: Goal: General experience of comfort will improve and/or be controlled Outcome: Progressing   Problem: Skin Integrity: Goal: Risk for impaired skin integrity will decrease Outcome: Progressing

## 2024-10-13 NOTE — Progress Notes (Signed)
 Per nurse patient will not be available for EEG due to expected discharge.

## 2024-11-08 ENCOUNTER — Other Ambulatory Visit: Payer: Self-pay

## 2024-11-08 ENCOUNTER — Other Ambulatory Visit (HOSPITAL_COMMUNITY): Payer: Self-pay | Admitting: Cardiology

## 2024-11-08 ENCOUNTER — Encounter (HOSPITAL_COMMUNITY): Payer: Self-pay | Admitting: Emergency Medicine

## 2024-11-08 ENCOUNTER — Emergency Department (HOSPITAL_COMMUNITY)
Admission: EM | Admit: 2024-11-08 | Discharge: 2024-11-08 | Disposition: A | Discharge status: Home / Self Care | Attending: Emergency Medicine | Admitting: Emergency Medicine

## 2024-11-08 DIAGNOSIS — Z7901 Long term (current) use of anticoagulants: Secondary | ICD-10-CM | POA: Insufficient documentation

## 2024-11-08 DIAGNOSIS — M5431 Sciatica, right side: Secondary | ICD-10-CM | POA: Diagnosis present

## 2024-11-08 MED ORDER — OXYCODONE-ACETAMINOPHEN 5-325 MG PO TABS
1.0000 | ORAL_TABLET | Freq: Once | ORAL | Status: AC
  Administered 2024-11-08: 1 via ORAL
  Filled 2024-11-08: qty 1

## 2024-11-08 MED ORDER — TORSEMIDE 20 MG PO TABS: 20.0000 mg | ORAL_TABLET | ORAL | 1 refills | Status: DC | PRN

## 2024-11-08 MED ORDER — DIGOXIN 125 MCG PO TABS: 0.1250 mg | ORAL_TABLET | Freq: Every day | ORAL | 1 refills | Status: DC

## 2024-11-08 MED ORDER — SACUBITRIL-VALSARTAN 24-26 MG PO TABS: 1.0000 | ORAL_TABLET | Freq: Two times a day (BID) | ORAL | 1 refills | Status: DC

## 2024-11-08 MED ORDER — ATORVASTATIN CALCIUM 40 MG PO TABS: 40.0000 mg | ORAL_TABLET | Freq: Every day | ORAL | 1 refills | Status: DC

## 2024-11-08 MED ORDER — IVABRADINE HCL 5 MG PO TABS: 5.0000 mg | ORAL_TABLET | Freq: Two times a day (BID) | ORAL | 1 refills | Status: DC

## 2024-11-08 MED ORDER — APIXABAN 5 MG PO TABS: 5.0000 mg | ORAL_TABLET | Freq: Two times a day (BID) | ORAL | 1 refills | Status: DC

## 2024-11-08 MED ORDER — DAPAGLIFLOZIN PROPANEDIOL 10 MG PO TABS: 10.0000 mg | ORAL_TABLET | Freq: Every day | ORAL | 1 refills | Status: DC

## 2024-11-08 MED ORDER — SPIRONOLACTONE 25 MG PO TABS: 25.0000 mg | ORAL_TABLET | Freq: Every day | ORAL | 1 refills | Status: DC

## 2024-11-08 NOTE — ED Notes (Signed)
 In to give pt ordered pain medication as well as update on need for urine specimen. Pt took pain medication and stated he was leaving cause we not doing anything for him. Explained to pt what had been ordered for him and he proceeded to call this nurse a bitch. Charge nurse Ellouise called for security to be at bedside to escort pt out.
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
## 2024-11-08 NOTE — ED Notes (Signed)
 [facility] called to arrange transportation back home; notified at 0600

## 2024-11-08 NOTE — ED Notes (Signed)
 Per charge nurse Ellouise, pt not wanting discharge instructions explained. Security assisted pt to wheelchair and wheeled pt out to lobby where he will wait for a ride home per charge nurse, Ellouise.
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
## 2024-11-09 ENCOUNTER — Ambulatory Visit: Payer: Self-pay | Admitting: Infectious Diseases

## 2024-11-09 DIAGNOSIS — B20 Human immunodeficiency virus [HIV] disease: Secondary | ICD-10-CM
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
## 2024-11-10 ENCOUNTER — Telehealth (HOSPITAL_COMMUNITY): Payer: Self-pay | Admitting: Cardiology

## 2024-11-10 ENCOUNTER — Other Ambulatory Visit (HOSPITAL_COMMUNITY): Payer: Self-pay

## 2024-11-10 MED ORDER — SACUBITRIL-VALSARTAN 24-26 MG PO TABS: 1.0000 | ORAL_TABLET | Freq: Two times a day (BID) | ORAL | 1 refills | Status: AC

## 2024-11-10 MED ORDER — TORSEMIDE 20 MG PO TABS: 20.0000 mg | ORAL_TABLET | ORAL | 1 refills | Status: DC | PRN

## 2024-11-10 MED ORDER — SPIRONOLACTONE 25 MG PO TABS: 25.0000 mg | ORAL_TABLET | Freq: Every day | ORAL | 1 refills | Status: AC

## 2024-11-10 MED ORDER — DAPAGLIFLOZIN PROPANEDIOL 10 MG PO TABS: 10.0000 mg | ORAL_TABLET | Freq: Every day | ORAL | 1 refills | Status: AC

## 2024-11-10 MED ORDER — IVABRADINE HCL 5 MG PO TABS: 5.0000 mg | ORAL_TABLET | Freq: Two times a day (BID) | ORAL | 1 refills | Status: AC

## 2024-11-10 MED ORDER — DIGOXIN 125 MCG PO TABS: 0.1250 mg | ORAL_TABLET | Freq: Every day | ORAL | 1 refills | Status: AC

## 2024-11-10 MED ORDER — ATORVASTATIN CALCIUM 40 MG PO TABS: 40.0000 mg | ORAL_TABLET | Freq: Every day | ORAL | 1 refills | Status: AC

## 2024-11-10 MED ORDER — APIXABAN 5 MG PO TABS: 5.0000 mg | ORAL_TABLET | Freq: Two times a day (BID) | ORAL | 1 refills | Status: AC

## 2024-11-19 NOTE — Progress Notes (Signed)
 Devin [name]                                          MRN: [id number]   11/19/2024   The [facility] Quality Team Specialist reviewed this patient medical record for the purposes of chart review for care gap closure. The following were reviewed: abstraction for care gap closure-glycemic status assessment.    [facility] Quality Team

## 2024-11-29 ENCOUNTER — Other Ambulatory Visit (HOSPITAL_COMMUNITY): Payer: Self-pay

## 2024-11-29 MED ORDER — TORSEMIDE 20 MG PO TABS: 20.0000 mg | ORAL_TABLET | Freq: Every day | ORAL | 1 refills | Status: AC | PRN

## 2024-12-07 ENCOUNTER — Ambulatory Visit: Admitting: Family Medicine
